# Patient Record
Sex: Female | Born: 1935
Health system: Southern US, Community
[De-identification: ages and names within clinical notes are randomized; demographics above are authoritative.]

## PROBLEM LIST (undated history)

## (undated) DIAGNOSIS — R112 Nausea with vomiting, unspecified: Secondary | ICD-10-CM

## (undated) DIAGNOSIS — I219 Acute myocardial infarction, unspecified: Secondary | ICD-10-CM

## (undated) DIAGNOSIS — M545 Low back pain, unspecified: Secondary | ICD-10-CM

## (undated) DIAGNOSIS — I2510 Atherosclerotic heart disease of native coronary artery without angina pectoris: Secondary | ICD-10-CM

## (undated) DIAGNOSIS — Z9289 Personal history of other medical treatment: Secondary | ICD-10-CM

## (undated) DIAGNOSIS — D496 Neoplasm of unspecified behavior of brain: Secondary | ICD-10-CM

## (undated) DIAGNOSIS — C4490 Unspecified malignant neoplasm of skin, unspecified: Secondary | ICD-10-CM

## (undated) DIAGNOSIS — E7800 Pure hypercholesterolemia, unspecified: Secondary | ICD-10-CM

## (undated) DIAGNOSIS — Z923 Personal history of irradiation: Secondary | ICD-10-CM

## (undated) DIAGNOSIS — G8929 Other chronic pain: Secondary | ICD-10-CM

## (undated) DIAGNOSIS — K219 Gastro-esophageal reflux disease without esophagitis: Secondary | ICD-10-CM

## (undated) DIAGNOSIS — Z9889 Other specified postprocedural states: Secondary | ICD-10-CM

## (undated) DIAGNOSIS — I10 Essential (primary) hypertension: Secondary | ICD-10-CM

## (undated) DIAGNOSIS — M1990 Unspecified osteoarthritis, unspecified site: Secondary | ICD-10-CM

## (undated) HISTORY — PX: SKIN CANCER EXCISION: SHX779

## (undated) HISTORY — DX: Neoplasm of unspecified behavior of brain: D49.6

## (undated) HISTORY — PX: BACK SURGERY: SHX140

## (undated) HISTORY — PX: OTHER SURGICAL HISTORY: SHX169

## (undated) HISTORY — PX: CORONARY ANGIOPLASTY: SHX604

## (undated) HISTORY — PX: CARPAL TUNNEL RELEASE: SHX101

## (undated) HISTORY — PX: SHOULDER OPEN ROTATOR CUFF REPAIR: SHX2407

## (undated) HISTORY — PX: THYROID SURGERY: SHX805

## (undated) HISTORY — PX: ANTERIOR CERVICAL DECOMP/DISCECTOMY FUSION: SHX1161

---

## 2000-09-20 ENCOUNTER — Ambulatory Visit (HOSPITAL_BASED_OUTPATIENT_CLINIC_OR_DEPARTMENT_OTHER): Admission: RE | Admit: 2000-09-20 | Discharge: 2000-09-20 | Payer: Self-pay | Admitting: Orthopaedic Surgery

## 2001-05-09 ENCOUNTER — Ambulatory Visit (HOSPITAL_BASED_OUTPATIENT_CLINIC_OR_DEPARTMENT_OTHER): Admission: RE | Admit: 2001-05-09 | Discharge: 2001-05-10 | Payer: Self-pay | Admitting: Orthopaedic Surgery

## 2004-08-09 DIAGNOSIS — I219 Acute myocardial infarction, unspecified: Secondary | ICD-10-CM

## 2004-08-09 HISTORY — DX: Acute myocardial infarction, unspecified: I21.9

## 2004-09-19 ENCOUNTER — Inpatient Hospital Stay (HOSPITAL_COMMUNITY): Admission: EM | Admit: 2004-09-19 | Discharge: 2004-09-24 | Payer: Self-pay | Admitting: Emergency Medicine

## 2004-12-30 ENCOUNTER — Encounter: Admission: RE | Admit: 2004-12-30 | Discharge: 2004-12-30 | Payer: Self-pay | Admitting: Cardiovascular Disease

## 2005-11-25 ENCOUNTER — Encounter: Admission: RE | Admit: 2005-11-25 | Discharge: 2005-11-25 | Payer: Self-pay | Admitting: Cardiovascular Disease

## 2005-12-23 ENCOUNTER — Encounter: Admission: RE | Admit: 2005-12-23 | Discharge: 2005-12-23 | Payer: Self-pay | Admitting: Cardiovascular Disease

## 2006-01-19 ENCOUNTER — Ambulatory Visit: Payer: Self-pay | Admitting: Infectious Diseases

## 2007-05-22 ENCOUNTER — Encounter: Admission: RE | Admit: 2007-05-22 | Discharge: 2007-05-22 | Payer: Self-pay | Admitting: Cardiovascular Disease

## 2008-09-13 ENCOUNTER — Inpatient Hospital Stay (HOSPITAL_COMMUNITY): Admission: RE | Admit: 2008-09-13 | Discharge: 2008-09-15 | Payer: Self-pay | Admitting: Neurosurgery

## 2009-09-15 ENCOUNTER — Encounter: Admission: RE | Admit: 2009-09-15 | Discharge: 2009-09-15 | Payer: Self-pay | Admitting: Cardiovascular Disease

## 2010-01-28 ENCOUNTER — Encounter: Admission: RE | Admit: 2010-01-28 | Discharge: 2010-01-28 | Payer: Self-pay | Admitting: Cardiovascular Disease

## 2010-02-04 ENCOUNTER — Inpatient Hospital Stay (HOSPITAL_COMMUNITY): Admission: RE | Admit: 2010-02-04 | Discharge: 2010-02-06 | Payer: Self-pay | Admitting: Neurosurgery

## 2010-04-30 ENCOUNTER — Encounter: Admission: RE | Admit: 2010-04-30 | Discharge: 2010-04-30 | Payer: Self-pay | Admitting: Neurosurgery

## 2010-10-25 LAB — SURGICAL PCR SCREEN
MRSA, PCR: NEGATIVE
Staphylococcus aureus: NEGATIVE

## 2010-10-25 LAB — CBC
MCHC: 34.4 g/dL (ref 30.0–36.0)
RDW: 13.4 % (ref 11.5–15.5)
WBC: 3.4 10*3/uL — ABNORMAL LOW (ref 4.0–10.5)

## 2010-10-25 LAB — BASIC METABOLIC PANEL
BUN: 24 mg/dL — ABNORMAL HIGH (ref 6–23)
Calcium: 9.7 mg/dL (ref 8.4–10.5)
GFR calc non Af Amer: 43 mL/min — ABNORMAL LOW (ref >60)
Glucose, Bld: 115 mg/dL — ABNORMAL HIGH (ref 70–99)
Sodium: 137 mEq/L (ref 135–145)

## 2010-11-24 LAB — BASIC METABOLIC PANEL
BUN: 9 mg/dL (ref 6–23)
Calcium: 9.4 mg/dL (ref 8.4–10.5)
Creatinine, Ser: 0.78 mg/dL (ref 0.4–1.2)
GFR calc Af Amer: >60 mL/min (ref >60)
GFR calc non Af Amer: >60 mL/min (ref >60)

## 2010-11-24 LAB — CBC
Platelets: 293 10*3/uL (ref 150–400)
RBC: 3.71 MIL/uL — ABNORMAL LOW (ref 3.87–5.11)
WBC: 3.9 10*3/uL — ABNORMAL LOW (ref 4.0–10.5)

## 2010-12-22 NOTE — Op Note (Signed)
NAMEYERALDINE, FORNEY             ACCOUNT NO.:  [id number]   MEDICAL RECORD NO.:  [id number]          PATIENT TYPE:  INP   LOCATION:  2899                         FACILITY:  [facility]   PHYSICIAN:  Hilda Lias, M.D.   DATE OF BIRTH:  1934-10-19   DATE OF PROCEDURE:  09/13/2008  DATE OF DISCHARGE:                               OPERATIVE REPORT   PREOPERATIVE DIAGNOSIS:  C3-C4, C4-C5, and C5-C6 spondylosis with  chronic radiculopathy.   POSTOPERATIVE DIAGNOSES:  C3-C4, C4-C5, and C5-C6 spondylosis with  chronic radiculopathy.   PROCEDURE:  Anterior C3-C4, C4-C5, and C5-C6 diskectomy, decompression  of spinal cord, interbody fusion with auto and allograft, plate, and  microscope.   SURGEON:  Hilda Lias, MD   ASSISTANT:  Danae Orleans. Venetia Maxon, MD.   CLINICAL HISTORY:  Ms. Peake is a 76 year old female complaining of  neck pain with rest of both upper extremity, left worse than right one.  The patient has failed conservative treatment.  X-ray showed stenosis  with foraminal narrowing at the level of C3-C4, C4-C5, and C5-C6.  Surgery was advised.   PROCEDURE IN DETAIL:  The patient was taken to the OR.  After  intubation, the left side of the neck was cleaned with DuraPrep.  A  transverse incision was made through the skin, subcutaneous tissue,  platysma down to the cervical area.  X-rays showed that we were at the  level of C4-C5.  From then on, we opened the anterior ligament of C3-C4,  C4-C5, and C5-C6 and also we proceeded with removal of osteophyte.  We  entered the disk space at the level of C3-C4.  The disk space was quite  narrow and there was some calcification of the posterior ligament.  Total diskectomy using the drill as well as the Kerrison punch was  achieved.  The posterior ligament was opened and the calcified posterior  ligament was removed with decompression of the spinal cord and the  foramen bilaterally.  The same procedure was done at the level of C4-C5  and  C5-C6 with the same finding, being the worse at C4-C5.  Nevertheless, there was quite a stenosis at the level of C5-C6.  With  moderate difficulty, the area was well decompressed, the spinal cord was  opened and freed.  Foraminotomy was accomplished and the endplate was  drilled.  At the level of C3-C4, we introduced a lordotic 6-mm graft  with autograft inside and BMX.  At the level of C4-C5 and C5-C6, the  graft was 7 mm and also lordotic.  Then a plate using 4 screws which  were fixed and four which were variable of 30 mm length were inserted to  get the plate in place.  Lateral cervical spine showed good position of  the graft and the plate.  We investigated the area.  The patient had  quite a bit of hemostasis.  There was no evidence of any bleeding.  Because of that, as for the time being we decided not to leave any  drain.  The wound was closed with Vicryl and Steri-Strips.  ______________________________  Hilda Lias, M.D.     EB/MEDQ  D:  09/13/2008  T:  09/14/2008  Job:  [id number]

## 2010-12-22 NOTE — Discharge Summary (Signed)
[name], WORTH             ACCOUNT NO.:  [id number]   MEDICAL RECORD NO.:  [id number]          PATIENT TYPE:  INP   LOCATION:  3019                         FACILITY:  [facility]   PHYSICIAN:  Danae Orleans. Venetia Maxon, M.D.  DATE OF BIRTH:  1935-06-23   DATE OF ADMISSION:  09/13/2008  DATE OF DISCHARGE:  09/15/2008                               DISCHARGE SUMMARY   REASON FOR ADMISSION:  Herniated cervical disk with cervical spondylosis  and radiculopathy at C3-4, C4-5, C5-6 levels post.   FINAL DIAGNOSES:  Herniated cervical disk with cervical spondylosis and  radiculopathy at C3-4, C4-5, C5-6 levels post.   BRIEF HISTORY, PHYSICAL, AND HOSPITAL COURSE:  Adriana Jordan is a 76-  year-old woman with neck pain and both bilateral upper extremity pain,  left greater than right who was elected to take her for surgery where  she after failed conservative management and she underwent anterior  cervical decompression and fusion at C3-4, C4-5, and C5-6 levels with  allograft bone graft and anterior cervical plate.  She did well  postoperatively.  Had some soreness initially.  Postoperatively, she has  been turned on a PCA for pain control.  This was discontinued on  postoperative day #1 and postoperative day #2, she was up and walking  without significant pain complaints, taking oral pain medication without  difficulty, mobilizing without difficulty, and with full strength in  upper extremities.  Overall, she was improving nicely.  It was elected  to discharge her home.   Discharge medications include her preoperative medications of  Women's  daily multivitamin, Benicar 20 mg daily, simvastatin 40 mg daily,  omeprazole 20 mg daily, promethazine 12.5-25 mg as needed, and Percocet  5/325 one to two every 4 hours as needed for pain.   Instructions were to wear her soft cervical collar and to follow up with  Dr. Jeral Fruit in the office in 3 weeks with a postoperative radiographs.      Danae Orleans.  Venetia Maxon, M.D.  Electronically Signed     JDS/MEDQ  D:  09/15/2008  T:  09/16/2008  Job:  [id number]

## 2010-12-25 NOTE — Discharge Summary (Signed)
Adriana Jordan, Adriana Jordan             ACCOUNT NO.:  [id number]   MEDICAL RECORD NO.:  [id number]          PATIENT TYPE:  INP   LOCATION:  2022                         FACILITY:  [facility]   PHYSICIAN:  Ricki Rodriguez, M.D.  DATE OF BIRTH:  June 18, 1935   DATE OF ADMISSION:  09/19/2004  DATE OF DISCHARGE:  09/24/2004                                 DISCHARGE SUMMARY   PRINCIPAL DIAGNOSES:  1.  Acute non-Q-wave myocardial infarction.  2.  Two vessel coronary artery disease.  3.  Reflux esophagitis.  4.  Status post percutaneous transluminal coronary angioplasty of right      coronary artery and left anterior descending.   DISCHARGE MEDICATIONS:  1.  Plavix 75 mg one daily.  2.  Aspirin 325 mg one daily.  3.  Zocor 40 mg one daily.  4.  Colace 100 mg two daily.  5.  Toprol XL 25 mg one daily.  6.  Prilosec over-the-counter one twice daily.  7.  Norvasc 2.5 mg one daily.  8.  Altace 2.5 mg one daily.   ACTIVITY:  As tolerated.  Increase walking as tolerated.   DIET:  Low fat, low salt diet as tolerated.   FOLLOWUP:  With Dr. Orpah Cobb in two weeks.  The patient is to call [phone] for appointment.   CONDITION ON DISCHARGE:  Improved.   HISTORY:  This 76 year old white female presented with chest pain,  substernal, pressure-like, radiating to the back, associated with sweating  spell, minimal relief with the nitroglycerin sublingual, however,  significant relief with oxygen and IV nitroglycerin drip.   PAST MEDICAL HISTORY:  Negative for diabetes, hypertension, smoking,  myocardial infarction, and alcohol intake.  Positive for elevated  cholesterol level.   PHYSICAL EXAMINATION:  VITAL SIGNS:  Pulse 66, respirations 16, blood  pressure 121/60, height 5 feet 7 inches, weight 157 pounds.  GENERAL:  The patient was alert and oriented x3.  HEENT:  Head was normocephalic, atraumatic.  Smt. eyes.  Conjunctivae pink.  Sclerae nonicteric.  The patient is wearing glasses.  Ears,  nose, throat:  Pink and moist.  NECK:  No JVD, no carotid bruits.  LUNGS:  Clear bilaterally.  HEART:  Normal S1 and S2.  ABDOMEN:  Soft and nontender.  EXTREMITIES:  No cyanosis, clubbing, or edema.  Peripheral pulses were 2+.  NEUROLOGIC:  The patient moves all four extremities and had bilateral equal  grips.   LABORATORY DATA:  Sodium 136, potassium 5.4, glucose 122, BUN and creatinine  normal.  Subsequent sodium was 140, potassium was 4.2, glucose was down to  97, and BUN and creatinine were normal at 11 and 0.9, respectively.  CPK  elevated at 257, with MB of 14.2, and troponin-I of 0.78.  Subsequent CPK  was 543, with a MB of 61, and troponin-I of 4.75.  The final CPK was 322,  with a MB of 6.9, and troponin-I of 2.81.  Cholesterol was elevated at 297,  HDL cholesterol of 60, and triglycerides of 54.  The patient's hemoglobin  was 13.3, hematocrit of 38.1, normal white blood cell count and platelet  count.  Subsequent hemoglobin was 11.2, hematocrit of 32.7, and normal white  blood cell count and platelet count.  Chest x-ray revealed borderline  cardiomegaly with mild pulmonary vascular congestion, mild bronchitic  changes.  EKG revealed normal sinus rhythm with left anterior fascicular  block.   HOSPITAL COURSE:  The patient was admitted to telemetry unit.  She ruled in  for a non-Q-wave myocardial infarction with a small posterior wall  infarction.  She received IV nitroglycerin, heparin, IV Integrilin.  She  underwent cardiac catheterization on September 21, 2004, that showed  significant two vessel coronary artery disease.  She had a stent placement 3  mm x 23 mm in the mid LAD lesion that was reduced to 0%, and she had a 3 mm  x 18 mm Cypher stent placed in the right coronary artery mid vessel area  that was reduced to 0% residual lesion also with good TIMI III flow.  Overall, the patient had successful angioplasty.  Post-procedure had no  complications, and gradual  increase of activity which was tolerated well,  and hence she was discharged home on September 24, 2004, in satisfactory  condition with a followup by me in two to three weeks.      ASK/MEDQ  D:  09/24/2004  T:  09/24/2004  Job:  [id number]

## 2010-12-25 NOTE — Op Note (Signed)
[place]  Patient:    Adriana Jordan, Adriana Jordan Visit Number: [id number] MRN: [id number]          Service Type: DSU Location: [facility] Attending Physician:  Marcene Corning Proc. Date: 05/09/01 Admit Date:  05/09/2001                             Operative Report  PREOPERATIVE DIAGNOSIS:  Right shoulder rotator cuff tear recurrent.  POSTOPERATIVE DIAGNOSIS:  Right shoulder rotator cuff tear recurrent.  PROCEDURE PERFORMED: 1. Right shoulder arthroscopic debridement. 2. Right shoulder open repeat rotator cuff repair.  ANESTHESIA:  General en bloc.  SURGEON:  Lubertha Basque. Jerl Santos, M.D.  ASSISTANT:  Prince Rome, P.A.  INDICATIONS:  The patient is a 76 year old woman with a long history of right shoulder difficulty.  She is status post two surgeries to the shoulder, one of which I had performed.  She had a mini open rotator cuff repair many months ago, but never really got better.  I then performed an arthroscopy several months ago, and again she never really got better.  A follow up MRI scan was performed a couple of months ago, which showed a recurrent rotator cuff tear on the far posterior aspect of the cuff measuring 6 or 8 mm in width.  At this point, this continues to interfere with rest, work, and recreation.  She is offered repeat surgery.  The procedure risks were discussed with the patient and informed operative consent was obtained after discussion of the possible complications of reaction to anesthesia and infection.  DESCRIPTION OF PROCEDURE:  The patient was taken to the operating suite where a general anesthetic was applied without difficulty.  She was also given a preoperative block in the preanesthesia area.  She was positioned in the beach chair position, prepped, and draped in a normal sterile fashion.  After the administration of preop IV antibiotics, an arthroscopy to the right shoulder was performed through a total of two  portals.  The glenohumeral joint showed no degenerative change in the biceps tendon and labrale structures were well attached.  The rotator cuff was in fact torn over a 1 or 2 cm area on the far posterior aspect.  This was in the area of her earlier repair and it appeared that the sutures had pulled through the rotator cuff.  Her lateral portal was extended slightly to create about a 1 inch incision.  Dissection was carried down to expose the rotator cuff.  This cuff was a near full thickness tear. An incision was made and sutures were found below.  It felt as though she had excellent quality rotator cuff tissue and it was felt that a repair was definitely in her best interest.  A bur was used to remove some of the articular cartilage in the area of our planned repair.  The old sutures were removed and two new suture anchors were placed which were the 5.0 mm absorbable arthrex anchors.  These each had two Ethibond sutures emanating. These sutures were placed through the rotator cuff in horizontal mattress fashion and the cuff was tied down using all four of these sutures to the bleeding bed of bone.  An excellent repair was achieved with her arm at her side.  The shoulder was ranged fully and thoroughly irrigated.  No additional tears were seen.  The deltoid fascia was reapproximated with 0 Vicryl followed by subcutaneous reapproximation with 2-0 undyed Vicryl and skin  with nylon. Adaptic was placed on the wound and Marcaine was injected into the shoulder joint.  A dry gauze was applied followed by tape.  Estimated blood loss and intraoperative fluids, see anesthesia record.  The patient was extubated in the operating room and taken to the recovery room in stable condition.  Plans were for her to stay overnight for pain control with probable discharge home in the morning. Attending Physician:  Marcene Corning DD:  05/09/01 TD:  05/09/01 Job: [id number]

## 2010-12-25 NOTE — Cardiovascular Report (Signed)
[name], [name]             ACCOUNT NO.:  [id number]   MEDICAL RECORD NO.:  [id number]          PATIENT TYPE:  INP   LOCATION:  3315                         FACILITY:  [facility]   PHYSICIAN:  Ricki Rodriguez, M.D.  DATE OF BIRTH:  09-Jun-1935   DATE OF PROCEDURE:  09/21/2004  DATE OF DISCHARGE:                              CARDIAC CATHETERIZATION   PROCEDURES:  1.  Left heart catheterization.  2.  Selective coronary angiography.  3.  Left ventriculography.   INDICATIONS FOR PROCEDURE:  This 76 year old, white female had recent non-Q  wave myocardial infarction with chest pain, abnormal cardiac enzymes and  elevated cholesterol level.   APPROACH:  Right femoral artery using 5 French sheath and catheter.   COMPLICATIONS:  None.   HEMODYNAMIC DATA:  The left ventricular pressure was 114/10 and aortic  pressure was 114/63.   Less than 60 cc of dye was used.   Left ventriculogram showed mild inferior wall hypokinesia with ejection  fraction of 50-55%.   The left main coronary artery was unremarkable.   The left anterior descending coronary artery showed proximal luminal  irregularities followed by 30% eccentric and mid vessel, long 99% stenosis.  The distal vessel was unremarkable.   Diagonal-1 was also unremarkable and was larger vessel compared to diagonal-  2, diagonal-3 and diagonal-4, which are very small vessels.   Left circumflex coronary artery had osteal 20% narrowing and mid vessel  luminal irregularities, otherwise unremarkable.   Obtuse maginal-1 was small vessel.  Obtuse marginal-2 and obtuse marginal-3  were larger vessels and unremarkable.   The right coronary artery had proximal calcification and luminal  irregularities with 20% narrowing gradually tapering into 80% mid vessel  disease.  The distal vessel was without any significant lesions.  Posterior  branch were unremarkable and posterior descending artery had luminal  irregularities.    IMPRESSION:  1.  Two-vessel coronary artery disease.  2.  Mild left ventricular systolic dysfunction.   RECOMMENDATIONS:  This patient will undergo PTCA with stent placement in  left anterior descending coronary artery and possible stent in right  coronary artery.  Dr. Eduardo Osier. Harwani was notified.      ASK/MEDQ  D:  09/21/2004  T:  09/21/2004  Job:  [id number]

## 2010-12-25 NOTE — Op Note (Signed)
[place]  Patient:    Adriana Jordan, Adriana Jordan                      MRN: [id number] Proc. Date: 09/20/00 Adm. Date:  [id number] Attending:  Marcene Corning                           Operative Report  PREOPERATIVE DIAGNOSIS: 1. Right shoulder partial rotator cuff tear. 2. Right shoulder acromioclavicular pain.  POSTOPERATIVE DIAGNOSIS: 1. Right shoulder partial rotator cuff tear. 2. Right shoulder acromioclavicular pain.  OPERATION PERFORMED: 1. Right shoulder arthroscopic debridement. 2. Right shoulder arthroscopic acromioplasty. 3. Right shoulder arthroscopic AP resection.  ANESTHESIA:  General.  ATTENDING SURGEON:  Lubertha Basque. Jerl Santos, M.D.  ASSISTANT:  Lindwood Qua, P.A.  INDICATIONS FOR PROCEDURE:  The patient is a 76 year old woman about a year and a half from a shoulder arthropathy with open rotator cuff repair.  She has persisted with pain.  She has undergone extensive postoperative physical therapy.  She has had an MRI scan which shows a questionable rotator cuff tear with impingement from her acromioclavicular joint.  At this point she has also had oral anti-inflammatories and postoperative injections.  She is offered a repeat arthroscopy.  The procedure was discussed with the patient and informed operative consent was obtained after discussion of possible complications of reaction to anesthesia and infection.  DESCRIPTION OF PROCEDURE:  The patient was taken to an operating suite where general anesthetic was applied without difficulty.  She was then positioned in beach chair position and prepped and draped in normal sterile fashion.  After the administration of preop intravenous antibiotics, an arthroscopy of the right shoulder was performed through a total of three portals.  The glenohumeral joint showed no degenerative change and all labral structures were well attached including the biceps anchor.  There were some  mild degenerative changes on the humeral head which I would call grade 2.  She did have a partial thickness tear of the rotator cuff, seen from below and this required a debridement with the shaver.  From above, the cuff did appear intact to thorough inspection even after bursectomy had been completed.  I could not find the sutures placed at her initial surgery.  She had some mild residual prominence of the undersurface of her acromion which was addressed with a brief repeat acromioplasty.  The main problem seemed to be at the AP joint where she had hypertrophy of the distal clavicle which appeared to impinge on the cuff.  The undersurface of this bone was removed followed by a formal decompression of the [facility] joint where she also had bone-on-bone contact. The shoulder was thoroughly irrigated at the end of the case followed by placement of Marcaine with epinephrine and morphine.  Simple sutures of nylon were used to reapproximate the portals loosely followed by Adaptic and dry gauze with tape.  Estimated blood loss and intraoperative fluids can be obtained from Anesthesia records.  DISPOSITION:  The patient was taken to the recovery room in stable condition. Plans were for her to go home the same day and to follow up in the office in less than a week.  I will contact her by phone tonight. DD:  09/20/00 TD:  09/20/00 Job: [id number]

## 2010-12-25 NOTE — Cardiovascular Report (Signed)
Adriana Jordan, Adriana Jordan             ACCOUNT NO.:  [id number]   MEDICAL RECORD NO.:  [id number]          PATIENT TYPE:  INP   LOCATION:  3315                         FACILITY:  [facility]   PHYSICIAN:  Eduardo Osier. Sharyn Lull, M.D. DATE OF BIRTH:  26-Sep-1934   DATE OF PROCEDURE:  09/21/2004  DATE OF DISCHARGE:                              CARDIAC CATHETERIZATION   PROCEDURE:  1.  Successful percutaneous transluminal coronary angioplasty to mid left      anterior descending using 3.0 x 12.0-mm-long Maverick balloon.  2.  Successful deployment of 3.0 x 23.0-mm-long CYPHER drug-eluting stent in      mid left anterior descending.  3.  Successful percutaneous transluminal coronary angioplasty to mid right      coronary artery using 3.0 x 12.0-mm-long Maverick balloon.  4.  Successful deployment of 3.0 x 18.0-mm-long CYPHER drug-eluting stent in      mid right coronary artery.   INDICATIONS FOR PROCEDURE:  Adriana Jordan is a 76 year old white female with  past medical history significant for hypercholesteremia and GERD, who was  admitted by Dr. Algie Coffer on September 19, 2004 because of retrosternal chest  pain, pressure-like, radiating to the back, associated with diaphoresis  which resolved with sublingual nitroglycerin. Initial EKG showed normal  sinus rhythm with nonspecific ST-T wave changes. Repeat EKG showed normal  sinus rhythm with tall R-wave in V2 and T-wave inversion in anterolateral  leads. The patient ruled in for non-Q-wave myocardial infarction due to  elevated cardiac enzymes and EKG changes. The patient subsequently  underwent. Cardiac catheterization by Dr. Algie Coffer; LV showed mild inferior  wall hypokinesia, EF of 50% to 55%. Left main was okay. LAD has mild  proximal stenosis and then 90% to 95% mid-stenosis. Diagonal 1 was patent.  Diagonal 2, 3, and 4 were small vessels.  The left circumflex has mild  disease. OM-1 was small; OM-2 and OM-3 were patent. RCA has 80% to 85% mid-  stenosis and 20% mid and distal junction stenosis beyond the 80% stenosis.  PDA was patent. I was called for PTCA stenting to LAD and RCA.   PROCEDURE:  After obtaining the informed consent, the right groin was  prepped and draped in usual fashion. A 5-French arterial sheath was  exchanged to a 7-French arterial sheath over the wire without difficulty.  Next a 7-French Voda guiding catheter was advanced over the wire under  fluoroscopic guidance up to the ascending aorta. Wire was pulled out. The  catheter was aspirated and connected to the manifold. Catheter was further  advanced and engaged into left coronary ostium.   INTERVENTIONAL PROCEDURE:  Successful PTCA to mid LAD was done using 3.0 x  12.0-mm-long Maverick balloon for predilatation and then 3.0 x 23.0-mm-long  CYPHER drug-eluting stent was deployed at 10 atmospheric pressure, which was  fully expanded going up to 15 atmospheric pressure.  Lesion was dilated from  90% to 95% to 0% residual with excellent TIMI grade 3 distal flow without  evidence of dissection or distal embolization. Next, this guiding catheter  was pulled out over the wire, sheaths aspirated and flushed and then 7-  [place] JR-4 guiding catheter with side holes was advanced over the wire  under fluoroscopic guidance up to the ascending aorta. Wire was pulled out.  The catheter was aspirated and connected to the manifold. Catheter was  further advanced and engaged into right coronary ostium. Multiple views of  the right system were taken.   Next interventional procedure: Successful PTCA to mid RCA was done using  same 3.0 x 12.0-mm-long Maverick balloon for predilatation and then 3.0 x  18.0-mm-long CYPHER drug-eluting stent was deployed at 10 atmospheric  pressure, which was fully expanded, going up to 15 atmospheric pressure.  Lesion was dilated from 80% to 85% to 0% residual with excellent TIMI grade  3 distal flow without evidence of dissection or distal  embolization. The  patient received weight-based heparin, Integrilin and 300 mg of additional  Plavix during the procedure. The patient tolerated procedure well. There are  no complications. The patient was transferred to recovery room in stable  condition.      MNH/MEDQ  D:  09/21/2004  T:  09/22/2004  Job:  [id number]   cc:   [facility]   Ricki Rodriguez, M.D.  [address]  [place] [address]  Fax: [phone]

## 2011-07-28 ENCOUNTER — Other Ambulatory Visit: Payer: Self-pay | Admitting: Neurosurgery

## 2011-07-28 DIAGNOSIS — M549 Dorsalgia, unspecified: Secondary | ICD-10-CM

## 2011-07-28 DIAGNOSIS — M5410 Radiculopathy, site unspecified: Secondary | ICD-10-CM

## 2011-08-04 ENCOUNTER — Ambulatory Visit
Admission: RE | Admit: 2011-08-04 | Discharge: 2011-08-04 | Disposition: A | Discharge status: Home / Self Care | Payer: Medicare Other | Source: Ambulatory Visit | Attending: Neurosurgery | Admitting: Neurosurgery

## 2011-08-04 VITALS — BP 123/66 | HR 74 | Temp 97.4°F | Resp 14

## 2011-08-04 DIAGNOSIS — M5410 Radiculopathy, site unspecified: Secondary | ICD-10-CM

## 2011-08-04 DIAGNOSIS — M549 Dorsalgia, unspecified: Secondary | ICD-10-CM

## 2011-08-04 MED ORDER — IOHEXOL 180 MG/ML  SOLN
18.0000 mL | Freq: Once | INTRAMUSCULAR | Status: AC | PRN
  Administered 2011-08-04: 18 mL via INTRATHECAL

## 2011-08-04 MED ORDER — DIAZEPAM 5 MG/ML PO CONC
5.0000 mg | Freq: Once | ORAL | Status: DC
Start: 2011-08-04 — End: 2011-08-04

## 2011-08-04 MED ORDER — DIAZEPAM 5 MG PO TABS: 5.0000 mg | ORAL_TABLET | Freq: Once | ORAL | Status: DC

## 2011-08-04 MED ORDER — HYDROCODONE-ACETAMINOPHEN 5-325 MG PO TABS: 2.0000 | ORAL_TABLET | Freq: Once | ORAL | Status: DC

## 2011-08-04 NOTE — Progress Notes (Signed)
4403  Informed consent obtained.  1030  Taking po liquids.  Tolerated well.  Does not want pain med at present.    1045  Patient c/o bilateral lower extremity discomfort.  Received med (per Dr Karin Golden).  1115  More comfortable at present.  1156 To BR w/ assistance.  Gait steady.  Tolerated well.   Patient received verbal & written discharge instructions.  States that she understands.  Also, reviewed discharge instructions w/ patient's friend, Judeth Cornfield.  1200 Pt discharged to home.  Judeth Cornfield to drive.

## 2012-02-01 ENCOUNTER — Other Ambulatory Visit: Payer: Self-pay | Admitting: Cardiovascular Disease

## 2012-02-01 ENCOUNTER — Ambulatory Visit
Admission: RE | Admit: 2012-02-01 | Discharge: 2012-02-01 | Disposition: A | Discharge status: Home / Self Care | Payer: Medicare Other | Source: Ambulatory Visit | Attending: Cardiovascular Disease | Admitting: Cardiovascular Disease

## 2012-02-01 DIAGNOSIS — R0602 Shortness of breath: Secondary | ICD-10-CM

## 2012-02-17 ENCOUNTER — Ambulatory Visit (HOSPITAL_COMMUNITY)
Admission: RE | Admit: 2012-02-17 | Discharge: 2012-02-17 | Disposition: A | Discharge status: Home / Self Care | Payer: Medicare Other | Source: Ambulatory Visit | Attending: Cardiovascular Disease | Admitting: Cardiovascular Disease

## 2012-02-17 DIAGNOSIS — R0602 Shortness of breath: Secondary | ICD-10-CM | POA: Insufficient documentation

## 2012-02-17 MED ORDER — ALBUTEROL SULFATE (5 MG/ML) 0.5% IN NEBU
2.5000 mg | INHALATION_SOLUTION | Freq: Once | RESPIRATORY_TRACT | Status: AC
  Administered 2012-02-17: 2.5 mg via RESPIRATORY_TRACT

## 2014-12-03 DIAGNOSIS — I2510 Atherosclerotic heart disease of native coronary artery without angina pectoris: Secondary | ICD-10-CM | POA: Diagnosis not present

## 2014-12-04 DIAGNOSIS — M84374D Stress fracture, right foot, subsequent encounter for fracture with routine healing: Secondary | ICD-10-CM | POA: Diagnosis not present

## 2015-01-29 DIAGNOSIS — M67911 Unspecified disorder of synovium and tendon, right shoulder: Secondary | ICD-10-CM | POA: Diagnosis not present

## 2015-01-29 DIAGNOSIS — M1712 Unilateral primary osteoarthritis, left knee: Secondary | ICD-10-CM | POA: Diagnosis not present

## 2015-01-29 DIAGNOSIS — M79671 Pain in right foot: Secondary | ICD-10-CM | POA: Diagnosis not present

## 2015-02-05 DIAGNOSIS — M25511 Pain in right shoulder: Secondary | ICD-10-CM | POA: Diagnosis not present

## 2015-02-18 DIAGNOSIS — F419 Anxiety disorder, unspecified: Secondary | ICD-10-CM | POA: Diagnosis not present

## 2015-02-18 DIAGNOSIS — E784 Other hyperlipidemia: Secondary | ICD-10-CM | POA: Diagnosis not present

## 2015-02-18 DIAGNOSIS — I2510 Atherosclerotic heart disease of native coronary artery without angina pectoris: Secondary | ICD-10-CM | POA: Diagnosis not present

## 2015-02-18 DIAGNOSIS — I10 Essential (primary) hypertension: Secondary | ICD-10-CM | POA: Diagnosis not present

## 2015-02-26 DIAGNOSIS — M79671 Pain in right foot: Secondary | ICD-10-CM | POA: Diagnosis not present

## 2015-03-01 ENCOUNTER — Emergency Department (HOSPITAL_COMMUNITY): Payer: Medicare PPO

## 2015-03-01 ENCOUNTER — Encounter (HOSPITAL_COMMUNITY): Payer: Self-pay | Admitting: Family Medicine

## 2015-03-01 ENCOUNTER — Emergency Department (HOSPITAL_COMMUNITY)
Admission: EM | Admit: 2015-03-01 | Discharge: 2015-03-01 | Disposition: A | Discharge status: Home / Self Care | Payer: Medicare PPO | Attending: Emergency Medicine | Admitting: Emergency Medicine

## 2015-03-01 DIAGNOSIS — R55 Syncope and collapse: Secondary | ICD-10-CM | POA: Diagnosis not present

## 2015-03-01 DIAGNOSIS — R531 Weakness: Secondary | ICD-10-CM | POA: Diagnosis not present

## 2015-03-01 DIAGNOSIS — R51 Headache: Secondary | ICD-10-CM | POA: Diagnosis not present

## 2015-03-01 DIAGNOSIS — I252 Old myocardial infarction: Secondary | ICD-10-CM | POA: Diagnosis not present

## 2015-03-01 DIAGNOSIS — Z87891 Personal history of nicotine dependence: Secondary | ICD-10-CM | POA: Insufficient documentation

## 2015-03-01 DIAGNOSIS — R42 Dizziness and giddiness: Secondary | ICD-10-CM | POA: Insufficient documentation

## 2015-03-01 DIAGNOSIS — R112 Nausea with vomiting, unspecified: Secondary | ICD-10-CM | POA: Insufficient documentation

## 2015-03-01 DIAGNOSIS — E236 Other disorders of pituitary gland: Secondary | ICD-10-CM | POA: Diagnosis not present

## 2015-03-01 DIAGNOSIS — R1111 Vomiting without nausea: Secondary | ICD-10-CM

## 2015-03-01 DIAGNOSIS — R404 Transient alteration of awareness: Secondary | ICD-10-CM | POA: Diagnosis not present

## 2015-03-01 DIAGNOSIS — R519 Headache, unspecified: Secondary | ICD-10-CM

## 2015-03-01 DIAGNOSIS — J3489 Other specified disorders of nose and nasal sinuses: Secondary | ICD-10-CM | POA: Diagnosis not present

## 2015-03-01 HISTORY — DX: Acute myocardial infarction, unspecified: I21.9

## 2015-03-01 LAB — CBC WITH DIFFERENTIAL/PLATELET
BASOS ABS: 0 10*3/uL (ref 0.0–0.1)
BASOS PCT: 1 % (ref 0–1)
Eosinophils Absolute: 0.2 10*3/uL (ref 0.0–0.7)
Eosinophils Relative: 8 % — ABNORMAL HIGH (ref 0–5)
HCT: 33.7 % — ABNORMAL LOW (ref 36.0–46.0)
Hemoglobin: 11.6 g/dL — ABNORMAL LOW (ref 12.0–15.0)
Lymphocytes Relative: 42 % (ref 12–46)
Lymphs Abs: 1.2 10*3/uL (ref 0.7–4.0)
MCH: 31.4 pg (ref 26.0–34.0)
MCHC: 34.4 g/dL (ref 30.0–36.0)
MCV: 91.3 fL (ref 78.0–100.0)
Monocytes Absolute: 0.4 10*3/uL (ref 0.1–1.0)
Monocytes Relative: 13 % — ABNORMAL HIGH (ref 3–12)
NEUTROS ABS: 1 10*3/uL — AB (ref 1.7–7.7)
NEUTROS PCT: 36 % — AB (ref 43–77)
PLATELETS: 236 10*3/uL (ref 150–400)
RBC: 3.69 MIL/uL — ABNORMAL LOW (ref 3.87–5.11)
RDW: 13.5 % (ref 11.5–15.5)
WBC: 2.8 10*3/uL — AB (ref 4.0–10.5)

## 2015-03-01 LAB — COMPREHENSIVE METABOLIC PANEL
ALBUMIN: 4.1 g/dL (ref 3.5–5.0)
ALT: 23 U/L (ref 14–54)
ANION GAP: 12 (ref 5–15)
AST: 46 U/L — AB (ref 15–41)
Alkaline Phosphatase: 64 U/L (ref 38–126)
BILIRUBIN TOTAL: 0.4 mg/dL (ref 0.3–1.2)
BUN: 19 mg/dL (ref 6–20)
CALCIUM: 9.3 mg/dL (ref 8.9–10.3)
CHLORIDE: 98 mmol/L — AB (ref 101–111)
CO2: 25 mmol/L (ref 22–32)
Creatinine, Ser: 1.26 mg/dL — ABNORMAL HIGH (ref 0.44–1.00)
GFR calc Af Amer: 46 mL/min — ABNORMAL LOW (ref >60)
GFR, EST NON AFRICAN AMERICAN: 40 mL/min — AB (ref >60)
Glucose, Bld: 112 mg/dL — ABNORMAL HIGH (ref 65–99)
Potassium: 3.2 mmol/L — ABNORMAL LOW (ref 3.5–5.1)
SODIUM: 135 mmol/L (ref 135–145)
Total Protein: 7 g/dL (ref 6.5–8.1)

## 2015-03-01 LAB — URINALYSIS, ROUTINE W REFLEX MICROSCOPIC
BILIRUBIN URINE: NEGATIVE
Glucose, UA: NEGATIVE mg/dL
Hgb urine dipstick: NEGATIVE
KETONES UR: NEGATIVE mg/dL
NITRITE: NEGATIVE
PH: 6 (ref 5.0–8.0)
PROTEIN: NEGATIVE mg/dL
Specific Gravity, Urine: 1.013 (ref 1.005–1.030)
UROBILINOGEN UA: 0.2 mg/dL (ref 0.0–1.0)

## 2015-03-01 LAB — URINE MICROSCOPIC-ADD ON

## 2015-03-01 LAB — LIPASE, BLOOD: LIPASE: 47 U/L (ref 22–51)

## 2015-03-01 MED ORDER — ONDANSETRON HCL 4 MG/2ML IJ SOLN
4.0000 mg | Freq: Once | INTRAMUSCULAR | Status: AC
  Administered 2015-03-01: 4 mg via INTRAVENOUS
  Filled 2015-03-01: qty 2

## 2015-03-01 MED ORDER — SODIUM CHLORIDE 0.9 % IV BOLUS (SEPSIS)
500.0000 mL | INTRAVENOUS | Status: AC
  Administered 2015-03-01: 500 mL via INTRAVENOUS

## 2015-03-01 MED ORDER — ONDANSETRON 4 MG PO TBDP: 4.0000 mg | ORAL_TABLET | Freq: Three times a day (TID) | ORAL | Status: DC | PRN

## 2015-03-01 MED ORDER — METOCLOPRAMIDE HCL 5 MG/ML IJ SOLN
10.0000 mg | Freq: Once | INTRAMUSCULAR | Status: AC
  Administered 2015-03-01: 10 mg via INTRAVENOUS
  Filled 2015-03-01: qty 2

## 2015-03-01 MED ORDER — POTASSIUM CHLORIDE CRYS ER 20 MEQ PO TBCR
40.0000 meq | EXTENDED_RELEASE_TABLET | Freq: Once | ORAL | Status: AC
  Administered 2015-03-01: 40 meq via ORAL
  Filled 2015-03-01: qty 2

## 2015-03-01 MED ORDER — DIPHENHYDRAMINE HCL 50 MG/ML IJ SOLN
25.0000 mg | Freq: Once | INTRAMUSCULAR | Status: AC
  Administered 2015-03-01: 25 mg via INTRAVENOUS
  Filled 2015-03-01: qty 1

## 2015-03-01 NOTE — ED Notes (Signed)
Pt brought from church gathering via EMS with c/o dizziness and nausea/vomiting that started while at church. Pt also reports headache x3 days.  Patient is alert and oriented x4.

## 2015-03-01 NOTE — ED Notes (Signed)
Son called and updated by Cori Razor.

## 2015-03-01 NOTE — ED Notes (Signed)
Son updated on plan of care.

## 2015-03-01 NOTE — ED Notes (Signed)
Pt ambulated around POD A successfully, and without assistance.

## 2015-03-01 NOTE — ED Notes (Signed)
Called ct for transport.

## 2015-03-01 NOTE — Discharge Instructions (Signed)
General Headache Without Cause A headache is pain or discomfort felt around the head or neck area. The specific cause of a headache may not be found. There are many causes and types of headaches. A few common ones are:  Tension headaches.  Migraine headaches.  Cluster headaches.  Chronic daily headaches. HOME CARE INSTRUCTIONS   Keep all follow-up appointments with your caregiver or any specialist referral.  Only take over-the-counter or prescription medicines for pain or discomfort as directed by your caregiver.  Lie down in a dark, quiet room when you have a headache.  Keep a headache journal to find out what may trigger your migraine headaches. For example, write down:  What you eat and drink.  How much sleep you get.  Any change to your diet or medicines.  Try massage or other relaxation techniques.  Put ice packs or heat on the head and neck. Use these 3 to 4 times per day for 15 to 20 minutes each time, or as needed.  Limit stress.  Sit up straight, and do not tense your muscles.  Quit smoking if you smoke.  Limit alcohol use.  Decrease the amount of caffeine you drink, or stop drinking caffeine.  Eat and sleep on a regular schedule.  Get 7 to 9 hours of sleep, or as recommended by your caregiver.  Keep lights dim if bright lights bother you and make your headaches worse. SEEK MEDICAL CARE IF:   You have problems with the medicines you were prescribed.  Your medicines are not working.  You have a change from the usual headache.  You have nausea or vomiting. SEEK IMMEDIATE MEDICAL CARE IF:   Your headache becomes severe.  You have a fever.  You have a stiff neck.  You have loss of vision.  You have muscular weakness or loss of muscle control.  You start losing your balance or have trouble walking.  You feel faint or pass out.  You have severe symptoms that are different from your first symptoms. MAKE SURE YOU:   Understand these  instructions.  Will watch your condition.  Will get help right away if you are not doing well or get worse. Document Released: 07/26/2005 Document Revised: 10/18/2011 Document Reviewed: 08/11/2011 [facility] Patient Information 2015 [place], [place]. This information is not intended to replace advice given to you by your health care provider. Make sure you discuss any questions you have with your health care provider.  Migraine Headache A migraine headache is an intense, throbbing pain on one or both sides of your head. A migraine can last for 30 minutes to several hours. CAUSES  The exact cause of a migraine headache is not always known. However, a migraine may be caused when nerves in the brain become irritated and release chemicals that cause inflammation. This causes pain. Certain things may also trigger migraines, such as:  Alcohol.  Smoking.  Stress.  Menstruation.  Aged cheeses.  Foods or drinks that contain nitrates, glutamate, aspartame, or tyramine.  Lack of sleep.  Chocolate.  Caffeine.  Hunger.  Physical exertion.  Fatigue.  Medicines used to treat chest pain (nitroglycerine), birth control pills, estrogen, and some blood pressure medicines. SIGNS AND SYMPTOMS  Pain on one or both sides of your head.  Pulsating or throbbing pain.  Severe pain that prevents daily activities.  Pain that is aggravated by any physical activity.  Nausea, vomiting, or both.  Dizziness.  Pain with exposure to bright lights, loud noises, or activity.  General sensitivity  to bright lights, loud noises, or smells. Before you get a migraine, you may get warning signs that a migraine is coming (aura). An aura may include:  Seeing flashing lights.  Seeing bright spots, halos, or zigzag lines.  Having tunnel vision or blurred vision.  Having feelings of numbness or tingling.  Having trouble talking.  Having muscle weakness. DIAGNOSIS  A migraine headache is often  diagnosed based on:  Symptoms.  Physical exam.  A CT scan or MRI of your head. These imaging tests cannot diagnose migraines, but they can help rule out other causes of headaches. TREATMENT Medicines may be given for pain and nausea. Medicines can also be given to help prevent recurrent migraines.  HOME CARE INSTRUCTIONS  Only take over-the-counter or prescription medicines for pain or discomfort as directed by your health care provider. The use of long-term narcotics is not recommended.  Lie down in a dark, quiet room when you have a migraine.  Keep a journal to find out what may trigger your migraine headaches. For example, write down:  What you eat and drink.  How much sleep you get.  Any change to your diet or medicines.  Limit alcohol consumption.  Quit smoking if you smoke.  Get 7-9 hours of sleep, or as recommended by your health care provider.  Limit stress.  Keep lights dim if bright lights bother you and make your migraines worse. SEEK IMMEDIATE MEDICAL CARE IF:   Your migraine becomes severe.  You have a fever.  You have a stiff neck.  You have vision loss.  You have muscular weakness or loss of muscle control.  You start losing your balance or have trouble walking.  You feel faint or pass out.  You have severe symptoms that are different from your first symptoms. MAKE SURE YOU:   Understand these instructions.  Will watch your condition.  Will get help right away if you are not doing well or get worse. Document Released: 07/26/2005 Document Revised: 12/10/2013 Document Reviewed: 04/02/2013 [facility] Patient Information 2015 [place], [place]. This information is not intended to replace advice given to you by your health care provider. Make sure you discuss any questions you have with your health care provider.  Nausea and Vomiting Nausea is a sick feeling that often comes before throwing up (vomiting). Vomiting is a reflex where stomach contents  come out of your mouth. Vomiting can cause severe loss of body fluids (dehydration). Children and elderly adults can become dehydrated quickly, especially if they also have diarrhea. Nausea and vomiting are symptoms of a condition or disease. It is important to find the cause of your symptoms. CAUSES   Direct irritation of the stomach lining. This irritation can result from increased acid production (gastroesophageal reflux disease), infection, food poisoning, taking certain medicines (such as nonsteroidal anti-inflammatory drugs), alcohol use, or tobacco use.  Signals from the brain.These signals could be caused by a headache, heat exposure, an inner ear disturbance, increased pressure in the brain from injury, infection, a tumor, or a concussion, pain, emotional stimulus, or metabolic problems.  An obstruction in the gastrointestinal tract (bowel obstruction).  Illnesses such as diabetes, hepatitis, gallbladder problems, appendicitis, kidney problems, cancer, sepsis, atypical symptoms of a heart attack, or eating disorders.  Medical treatments such as chemotherapy and radiation.  Receiving medicine that makes you sleep (general anesthetic) during surgery. DIAGNOSIS Your caregiver may ask for tests to be done if the problems do not improve after a few days. Tests may also be done  if symptoms are severe or if the reason for the nausea and vomiting is not clear. Tests may include:  Urine tests.  Blood tests.  Stool tests.  Cultures (to look for evidence of infection).  X-rays or other imaging studies. Test results can help your caregiver make decisions about treatment or the need for additional tests. TREATMENT You need to stay well hydrated. Drink frequently but in small amounts.You may wish to drink water, sports drinks, clear broth, or eat frozen ice pops or gelatin dessert to help stay hydrated.When you eat, eating slowly may help prevent nausea.There are also some antinausea  medicines that may help prevent nausea. HOME CARE INSTRUCTIONS   Take all medicine as directed by your caregiver.  If you do not have an appetite, do not force yourself to eat. However, you must continue to drink fluids.  If you have an appetite, eat a normal diet unless your caregiver tells you differently.  Eat a variety of complex carbohydrates (rice, wheat, potatoes, bread), lean meats, yogurt, fruits, and vegetables.  Avoid high-fat foods because they are more difficult to digest.  Drink enough water and fluids to keep your urine clear or pale yellow.  If you are dehydrated, ask your caregiver for specific rehydration instructions. Signs of dehydration may include:  Severe thirst.  Dry lips and mouth.  Dizziness.  Dark urine.  Decreasing urine frequency and amount.  Confusion.  Rapid breathing or pulse. SEEK IMMEDIATE MEDICAL CARE IF:   You have blood or brown flecks (like coffee grounds) in your vomit.  You have black or bloody stools.  You have a severe headache or stiff neck.  You are confused.  You have severe abdominal pain.  You have chest pain or trouble breathing.  You do not urinate at least once every 8 hours.  You develop cold or clammy skin.  You continue to vomit for longer than 24 to 48 hours.  You have a fever. MAKE SURE YOU:   Understand these instructions.  Will watch your condition.  Will get help right away if you are not doing well or get worse. Document Released: 07/26/2005 Document Revised: 10/18/2011 Document Reviewed: 12/23/2010 [facility] Patient Information 2015 [place], [place]. This information is not intended to replace advice given to you by your health care provider. Make sure you discuss any questions you have with your health care provider.

## 2015-03-01 NOTE — ED Provider Notes (Signed)
CSN: [id number]     Arrival date & time 03/01/15  1812 History   First MD Initiated Contact with Patient 03/01/15 1839     Chief Complaint  Patient presents with  . Near Syncope  . Emesis     (Consider location/radiation/quality/duration/timing/severity/associated sxs/prior Treatment) The history is provided by the patient.    Adriana Jordan is a 80 year old female with history of heart attack, who is brought to the ER today via EMS for near syncope after multiple episodes of vomiting which occurred at a church function today after 3 days of a right-sided headache.  Her headache is located in the right side of her forehead and temple, has a throbbing quality, and has persisted over 3 days, is unrelieved with Advil, and is currently rated 8 out of 10.  She was able to go to church today, was indoors when she had sudden onset vomiting and near syncope.  She denies any loss of consciousness and denies falling, due to her friends holding her upright.   She further denies any fever, chills, sweats, numbness, tingling, vision changes.  She denies any sick contacts, she denies hematemesis.  She has a hx of migraines, but states this headache is not similar to her prior migraines. She's been having headaches roughly once a month, for the past several months, which last for several days at and time. She has not had any vomiting episodes with any of her more recent headaches. She denies any photophobia, phonophobia.  She denies any neck pain or back pain.  She further denies abdominal pain, shortness of breath, diarrhea, and weakness.  Her only other complaint is that her nose began running when she was brought into the ER.   Past Medical History  Diagnosis Date  . MI (myocardial infarction) 2008   History reviewed. No pertinent past surgical history. No family history on file. History  Substance Use Topics  . Smoking status: Former Research scientist (life sciences)  . Smokeless tobacco: Not on file  . Alcohol Use: Not on file    OB History    No data available     Review of Systems  Constitutional: Negative for fever, chills, diaphoresis, fatigue and unexpected weight change.  HENT: Positive for rhinorrhea. Negative for congestion, ear discharge, ear pain, sinus pressure and sore throat.   Respiratory: Negative.   Cardiovascular: Negative.   Gastrointestinal: Negative for nausea, abdominal pain, diarrhea, constipation, blood in stool and abdominal distention.  Genitourinary: Negative.   Musculoskeletal: Negative.   Skin: Negative.   Neurological: Negative for dizziness, tremors, seizures, syncope, facial asymmetry, speech difficulty, weakness and numbness.  Psychiatric/Behavioral: Negative.       Allergies  Review of patient's allergies indicates no known allergies.  Home Medications   Prior to Admission medications   Medication Sig Start Date End Date Taking? Authorizing Provider  ondansetron (ZOFRAN ODT) 4 MG disintegrating tablet Take 1 tablet (4 mg total) by mouth every 8 (eight) hours as needed for nausea. 03/01/15   Delsa Grana, PA-C   BP 126/61 mmHg  Pulse 79  Temp(Src) 98 F (36.7 C) (Oral)  Resp 14  SpO2 94% Physical Exam  Constitutional: She is oriented to person, place, and time. She appears well-developed and well-nourished. No distress.  Elderly female, appears stated age, nontoxic appearing  HENT:  Head: Normocephalic and atraumatic.  Right Ear: External ear normal.  Left Ear: External ear normal.  Nose: Nose normal.  Mouth/Throat: Oropharynx is clear and moist. No oropharyngeal exudate.  Eyes: Conjunctivae and EOM are  normal. Pupils are equal, round, and reactive to light. Right eye exhibits no discharge. Left eye exhibits no discharge. No scleral icterus.  Neck: Trachea normal, normal range of motion and full passive range of motion without pain. No JVD present. No muscular tenderness present. No rigidity. No tracheal deviation, no edema and normal range of motion present. No  Brudzinski's sign and no Kernig's sign noted. No thyromegaly present.  Cardiovascular: Normal rate, regular rhythm, normal heart sounds and intact distal pulses.  Exam reveals no gallop and no friction rub.   No murmur heard. Pulmonary/Chest: Effort normal and breath sounds normal. No stridor. No respiratory distress. She has no wheezes. She has no rales. She exhibits no tenderness.  Abdominal: Soft. Bowel sounds are normal. She exhibits no distension and no mass. There is no tenderness. There is no rebound and no guarding.  Musculoskeletal: Normal range of motion. She exhibits no edema or tenderness.  Lymphadenopathy:    She has no cervical adenopathy.  Neurological: She is alert and oriented to person, place, and time. She has normal strength and normal reflexes. She is not disoriented. She displays no tremor. No cranial nerve deficit or sensory deficit. She exhibits normal muscle tone. She displays a negative Romberg sign. She displays no seizure activity. Coordination normal.  Cranial nerves II through XII grossly intact, alert and oriented 3, normal strength and sensation throughout, normal dorsiflexion and plantarflexion, normal finger-nose, normal gait with even short steps  Skin: Skin is warm and dry. No rash noted. She is not diaphoretic. No erythema. No pallor.  Psychiatric: She has a normal mood and affect. Her behavior is normal. Judgment and thought content normal.  Nursing note and vitals reviewed.  ED Course  Procedures (including critical care time) Labs Review Labs Reviewed  COMPREHENSIVE METABOLIC PANEL - Abnormal; Notable for the following:    Potassium 3.2 (*)    Chloride 98 (*)    Glucose, Bld 112 (*)    Creatinine, Ser 1.26 (*)    AST 46 (*)    GFR calc non Af Amer 40 (*)    GFR calc Af Amer 46 (*)    All other components within normal limits  CBC WITH DIFFERENTIAL/PLATELET - Abnormal; Notable for the following:    WBC 2.8 (*)    RBC 3.69 (*)    Hemoglobin 11.6  (*)    HCT 33.7 (*)    Neutrophils Relative % 36 (*)    Neutro Abs 1.0 (*)    Monocytes Relative 13 (*)    Eosinophils Relative 8 (*)    All other components within normal limits  URINALYSIS, ROUTINE W REFLEX MICROSCOPIC (NOT AT [facility]) - Abnormal; Notable for the following:    Leukocytes, UA SMALL (*)    All other components within normal limits  LIPASE, BLOOD  URINE MICROSCOPIC-ADD ON   Imaging Review Ct Head Wo Contrast  03/01/2015   CLINICAL DATA:  Dizziness with nausea and vomiting. Headache for 3 days  EXAM: CT HEAD WITHOUT CONTRAST  TECHNIQUE: Contiguous axial images were obtained from the base of the skull through the vertex without intravenous contrast.  COMPARISON:  September 15, 2009  FINDINGS: There is age related volume loss.  There is enlargement of the pituitary with probable associated mass. There is expansion of the sella. There is no evidence of mass outside of the sella. There is no hemorrhage, extra-axial fluid collection, or midline shift. There is patchy small vessel disease in the centra semiovale bilaterally. No acute infarct is  demonstrable. Bony calvarium appears intact. Mastoid air cells are clear. There is mucosal thickening in multiple ethmoid air cells. There is opacification of a portion of the right sphenoid sinus.  IMPRESSION: There is pituitary enlargement with expansion of the sella. Suspect associated mass with the pituitary. This finding warrants nonemergent MRI of the sella pre and post-contrast.  There is age related volume loss with patchy periventricular small vessel disease. No intracranial hemorrhage, extra-axial fluid collection, or acute infarct. There are areas of paranasal sinus disease.   Electronically Signed   By: Lowella Grip III M.D.   On: 03/01/2015 21:55     EKG Interpretation None      MDM   Final diagnoses:  Headache  Head ache  Non-intractable vomiting without nausea, vomiting of unspecified type  Lightheaded    Patient with  headache, with frequency once a month for the past several months, history of migraines Vomiting today without abdominal pain, nausea or diarrhea Patient worked up for headache/presyncope, treated for nausea/vomiting Labs pertinent for hypokalemia, hypochloremia, mildly elevated creatinine, similar to most recent creatinine in the chart - likely CKD  Pt has had no vomiting in the ED, pt's headache has improved with headache cocktail.   PO potassium replacement, pt was ambulating in the halls, without difficulty'  Results of her head CT were reviewed with her and pt was advised to seek followup with her PCP for the advised non-emergent MRI.  Return precautions were given to the pt and to her son who verbalize understanding. PT discharged home     Delsa Grana, PA-C 03/05/15 [place], MD 03/17/15 [phone]

## 2015-03-01 NOTE — ED Notes (Signed)
Patient returned from CT

## 2015-03-01 NOTE — ED Notes (Signed)
Patient already in CT

## 2015-03-06 DIAGNOSIS — I10 Essential (primary) hypertension: Secondary | ICD-10-CM | POA: Diagnosis not present

## 2015-03-06 DIAGNOSIS — D496 Neoplasm of unspecified behavior of brain: Secondary | ICD-10-CM | POA: Diagnosis not present

## 2015-03-18 DIAGNOSIS — D496 Neoplasm of unspecified behavior of brain: Secondary | ICD-10-CM | POA: Diagnosis not present

## 2015-03-19 DIAGNOSIS — M25511 Pain in right shoulder: Secondary | ICD-10-CM | POA: Diagnosis not present

## 2015-03-20 DIAGNOSIS — R220 Localized swelling, mass and lump, head: Secondary | ICD-10-CM | POA: Diagnosis not present

## 2015-03-20 DIAGNOSIS — D496 Neoplasm of unspecified behavior of brain: Secondary | ICD-10-CM | POA: Diagnosis not present

## 2015-04-03 DIAGNOSIS — E784 Other hyperlipidemia: Secondary | ICD-10-CM | POA: Diagnosis not present

## 2015-04-03 DIAGNOSIS — R42 Dizziness and giddiness: Secondary | ICD-10-CM | POA: Diagnosis not present

## 2015-04-03 DIAGNOSIS — I4519 Other right bundle-branch block: Secondary | ICD-10-CM | POA: Diagnosis not present

## 2015-04-03 DIAGNOSIS — F419 Anxiety disorder, unspecified: Secondary | ICD-10-CM | POA: Diagnosis not present

## 2015-04-03 DIAGNOSIS — I2510 Atherosclerotic heart disease of native coronary artery without angina pectoris: Secondary | ICD-10-CM | POA: Diagnosis not present

## 2015-04-07 ENCOUNTER — Ambulatory Visit (INDEPENDENT_AMBULATORY_CARE_PROVIDER_SITE_OTHER): Payer: Medicare PPO | Admitting: Endocrinology

## 2015-04-07 VITALS — BP 122/84 | HR 79 | Temp 97.9°F | Ht 65.5 in | Wt 151.0 lb

## 2015-04-07 DIAGNOSIS — D352 Benign neoplasm of pituitary gland: Secondary | ICD-10-CM | POA: Diagnosis not present

## 2015-04-07 MED ORDER — DEXAMETHASONE 1 MG PO TABS: ORAL_TABLET | ORAL | Status: DC

## 2015-04-07 NOTE — Patient Instructions (Addendum)
you should do a "dexamethasone suppression test."  for this, you would take dexamethasone 1 mg at 10 pm, then come in for a "cortisol" blood test the next morning before 9 am.  you do not need to be fasting for this test. Here is a letter, to request the tests. We'll check the other hormones at the same time. We'll then report back to Dr Joya Salm.

## 2015-04-07 NOTE — Progress Notes (Signed)
   Subjective:    Patient ID: Adriana Jordan, female    DOB: [birth year], 80 y.o.   MRN: [id number]  HPI 1 month ago, pt was seen in ER for severe headache, but no assoc visual loss.  She was noted to have pituitary adenoma, and ref here.     Review of Systems Denies cold intolerance, diplopia, depression, rhinorrhea, hearing loss, sob, chest pain, n/v, rash, seizure, and weight change.  No change in chronic arthralgias.    Objective:   Physical Exam VS: see vs page GEN: no distress HEAD: head: no deformity eyes: no periorbital swelling, no proptosis external nose and ears are normal mouth: no lesion seen NECK: a healed scar is present.  i do not appreciate a nodule in the thyroid or elsewhere in the neck CHEST WALL: no deformity LUNGS:  Clear to auscultation CV: reg rate and rhythm, no murmur ABD: abdomen is soft, nontender.  no hepatosplenomegaly.  not distended.  no hernia MUSCULOSKELETAL: muscle bulk and strength are grossly normal.  no obvious joint swelling.  gait is normal and steady EXTEMITIES: no deformity.  no ulcer on the feet.  feet are of normal color and temp.  no edema PULSES: dorsalis pedis intact bilat.  no carotid bruit NEURO:  cn 2-12 grossly intact.   readily moves all 4's.  sensation is intact to touch on the feet SKIN:  Normal texture and temperature.  No rash or suspicious lesion is visible.   NODES:  None palpable at the neck PSYCH: alert, well-oriented.  Does not appear anxious nor depressed.   I have reviewed outside records: pt was seen by neurosurgery for pituitary adenoma, and ref here for hormonal testing.  Radiol: CT: pituitary enlargement, prob due to adenoma, is noted.     Assessment & Plan:  Pituitary adenoma, new, uncertain etiology.  Here for hormonal eval.  She wants to do labs at [facility].    Patient is advised the following: Patient Instructions  you should do a "dexamethasone suppression test."  for this, you would take  dexamethasone 1 mg at 10 pm, then come in for a "cortisol" blood test the next morning before 9 am.  you do not need to be fasting for this test. Here is a letter, to request the tests. We'll check the other hormones at the same time. We'll then report back to Dr Joya Salm.

## 2015-04-09 ENCOUNTER — Other Ambulatory Visit: Payer: Self-pay | Admitting: Endocrinology

## 2015-04-09 DIAGNOSIS — M79671 Pain in right foot: Secondary | ICD-10-CM | POA: Diagnosis not present

## 2015-04-09 DIAGNOSIS — D352 Benign neoplasm of pituitary gland: Secondary | ICD-10-CM | POA: Diagnosis not present

## 2015-04-10 LAB — TSH: TSH: 4.29 u[IU]/mL (ref 0.450–4.500)

## 2015-04-10 LAB — FSH/LH
FSH: 11.7 m[IU]/mL
LH: 3.2 m[IU]/mL

## 2015-04-10 LAB — T4, FREE: FREE T4: 0.48 ng/dL — AB (ref 0.82–1.77)

## 2015-04-10 LAB — CORTISOL: CORTISOL: 0.1 ug/dL

## 2015-04-10 LAB — INSULIN-LIKE GROWTH FACTOR: Insulin-Like GF-1: 69 ng/mL (ref 35–165)

## 2015-04-10 LAB — PROLACTIN: PROLACTIN: 43.4 ng/mL — AB (ref 4.8–23.3)

## 2015-04-11 ENCOUNTER — Encounter: Payer: Self-pay | Admitting: Endocrinology

## 2015-04-11 ENCOUNTER — Telehealth: Payer: Self-pay | Admitting: Endocrinology

## 2015-04-11 DIAGNOSIS — D352 Benign neoplasm of pituitary gland: Secondary | ICD-10-CM

## 2015-04-11 NOTE — Telephone Encounter (Signed)
Sorry, what i meant was please redo the blood test with a dilution

## 2015-04-11 NOTE — Telephone Encounter (Signed)
Please call lab: Please confirm the prolactin result with a dilution, thank you.

## 2015-04-11 NOTE — Telephone Encounter (Signed)
I contacted the [facility] and they stated the test was not done with dilution.

## 2015-04-15 NOTE — Telephone Encounter (Signed)
Lab stated the dilution could not be added on to this test. Lab technician stated the dilution has to be ordered with the initial test.

## 2015-04-15 NOTE — Telephone Encounter (Signed)
please call patient: Please come back to lab.  This is to confirm test result.  This is not anything to worry about.

## 2015-04-16 NOTE — Telephone Encounter (Signed)
I contacted the pt and advised of note below. Pt is coming in for labs on 04/22/2015.

## 2015-04-17 DIAGNOSIS — D352 Benign neoplasm of pituitary gland: Secondary | ICD-10-CM | POA: Diagnosis not present

## 2015-04-22 ENCOUNTER — Other Ambulatory Visit: Payer: Medicare Other

## 2015-04-22 DIAGNOSIS — D352 Benign neoplasm of pituitary gland: Secondary | ICD-10-CM

## 2015-04-23 LAB — PROLACTIN: Prolactin: 35.3 ng/mL

## 2015-04-24 DIAGNOSIS — M25571 Pain in right ankle and joints of right foot: Secondary | ICD-10-CM | POA: Diagnosis not present

## 2015-04-24 DIAGNOSIS — I10 Essential (primary) hypertension: Secondary | ICD-10-CM | POA: Diagnosis not present

## 2015-04-24 DIAGNOSIS — R51 Headache: Secondary | ICD-10-CM | POA: Diagnosis not present

## 2015-05-03 ENCOUNTER — Encounter (HOSPITAL_COMMUNITY): Payer: Self-pay | Admitting: *Deleted

## 2015-05-03 ENCOUNTER — Emergency Department (HOSPITAL_COMMUNITY)
Admission: EM | Admit: 2015-05-03 | Discharge: 2015-05-03 | Disposition: A | Discharge status: Home / Self Care | Payer: Medicare PPO | Attending: Emergency Medicine | Admitting: Emergency Medicine

## 2015-05-03 ENCOUNTER — Emergency Department (HOSPITAL_COMMUNITY): Payer: Medicare PPO

## 2015-05-03 DIAGNOSIS — R42 Dizziness and giddiness: Secondary | ICD-10-CM | POA: Diagnosis not present

## 2015-05-03 DIAGNOSIS — M1990 Unspecified osteoarthritis, unspecified site: Secondary | ICD-10-CM | POA: Diagnosis not present

## 2015-05-03 DIAGNOSIS — I252 Old myocardial infarction: Secondary | ICD-10-CM | POA: Diagnosis not present

## 2015-05-03 DIAGNOSIS — Z7982 Long term (current) use of aspirin: Secondary | ICD-10-CM | POA: Diagnosis not present

## 2015-05-03 DIAGNOSIS — Z79899 Other long term (current) drug therapy: Secondary | ICD-10-CM | POA: Diagnosis not present

## 2015-05-03 DIAGNOSIS — R55 Syncope and collapse: Secondary | ICD-10-CM | POA: Diagnosis not present

## 2015-05-03 DIAGNOSIS — E780 Pure hypercholesterolemia: Secondary | ICD-10-CM | POA: Insufficient documentation

## 2015-05-03 DIAGNOSIS — R531 Weakness: Secondary | ICD-10-CM | POA: Diagnosis not present

## 2015-05-03 DIAGNOSIS — R404 Transient alteration of awareness: Secondary | ICD-10-CM | POA: Diagnosis not present

## 2015-05-03 DIAGNOSIS — R110 Nausea: Secondary | ICD-10-CM | POA: Diagnosis not present

## 2015-05-03 HISTORY — DX: Pure hypercholesterolemia, unspecified: E78.00

## 2015-05-03 HISTORY — DX: Unspecified osteoarthritis, unspecified site: M19.90

## 2015-05-03 LAB — BASIC METABOLIC PANEL
ANION GAP: 8 (ref 5–15)
BUN: 12 mg/dL (ref 6–20)
CALCIUM: 9.1 mg/dL (ref 8.9–10.3)
CO2: 23 mmol/L (ref 22–32)
Chloride: 105 mmol/L (ref 101–111)
Creatinine, Ser: 1.02 mg/dL — ABNORMAL HIGH (ref 0.44–1.00)
GFR calc non Af Amer: 51 mL/min — ABNORMAL LOW (ref >60)
GFR, EST AFRICAN AMERICAN: 59 mL/min — AB (ref >60)
Glucose, Bld: 122 mg/dL — ABNORMAL HIGH (ref 65–99)
POTASSIUM: 3.4 mmol/L — AB (ref 3.5–5.1)
SODIUM: 136 mmol/L (ref 135–145)

## 2015-05-03 LAB — CBC
HEMATOCRIT: 33.7 % — AB (ref 36.0–46.0)
HEMOGLOBIN: 11.6 g/dL — AB (ref 12.0–15.0)
MCH: 31.8 pg (ref 26.0–34.0)
MCHC: 34.4 g/dL (ref 30.0–36.0)
MCV: 92.3 fL (ref 78.0–100.0)
Platelets: 296 10*3/uL (ref 150–400)
RBC: 3.65 MIL/uL — ABNORMAL LOW (ref 3.87–5.11)
RDW: 13.6 % (ref 11.5–15.5)
WBC: 3.5 10*3/uL — AB (ref 4.0–10.5)

## 2015-05-03 LAB — CBG MONITORING, ED: Glucose-Capillary: 90 mg/dL (ref 65–99)

## 2015-05-03 MED ORDER — SODIUM CHLORIDE 0.9 % IV BOLUS (SEPSIS)
1000.0000 mL | Freq: Once | INTRAVENOUS | Status: AC
  Administered 2015-05-03: 1000 mL via INTRAVENOUS

## 2015-05-03 NOTE — ED Provider Notes (Signed)
CSN: [id number]     Arrival date & time 05/03/15  1229 History   First MD Initiated Contact with Patient 05/03/15 1231     Chief Complaint  Patient presents with  . Near Syncope     (Consider location/radiation/quality/duration/timing/severity/associated sxs/prior Treatment) HPI  80 year old female presents after having a syncope episode at her nail salon. Patient states she was waiting for her nails dry and acutely felt lightheaded, dizzy, nauseated, and then passed out. Witnesses states she was out for about 1 minute. Patient denies any current headache, chest pain, or shortness of breath. Has not noticed any leg swelling. She feels back to normal now. She was given about 250 mL's of IV fluids prior to arrival by EMS. The patient has been having daily headaches for at least one month and had a CT scan that showed a possible pituitary mass. Currently waiting on MRI as an outpatient. No increase in her headaches or current headache today.  Past Medical History  Diagnosis Date  . MI (myocardial infarction) 2008  . Arthritis   . Hypercholesteremia    Past Surgical History  Procedure Laterality Date  . Back surgery    . Rotator cuff repair    . Cervical spine surgery     No family history on file. Social History  Substance Use Topics  . Smoking status: Former Research scientist (life sciences)  . Smokeless tobacco: None  . Alcohol Use: No   OB History    No data available     Review of Systems  Respiratory: Negative for shortness of breath.   Cardiovascular: Negative for chest pain and palpitations.  Gastrointestinal: Positive for nausea. Negative for vomiting.  Neurological: Positive for syncope and light-headedness.  All other systems reviewed and are negative.     Allergies  Review of patient's allergies indicates no known allergies.  Home Medications   Prior to Admission medications   Medication Sig Start Date End Date Taking? Authorizing Provider  aspirin 81 MG tablet Take 81 mg by mouth  daily.    Historical Provider, MD  dexamethasone (DECADRON) 1 MG tablet Take at 9-10 pm, the night before blood tests 04/07/15   Renato Shin, MD  ibuprofen (ADVIL,MOTRIN) 100 MG tablet Take 100 mg by mouth every 6 (six) hours as needed for fever.    Historical Provider, MD  losartan-hydrochlorothiazide (HYZAAR) 50-12.5 MG per tablet Take 1 tablet by mouth daily.    Historical Provider, MD  omeprazole (PRILOSEC) 20 MG capsule Take 20 mg by mouth daily.    Historical Provider, MD  simvastatin (ZOCOR) 40 MG tablet Take 40 mg by mouth daily.    Historical Provider, MD   BP 126/62 mmHg  Pulse 74  Resp 13  SpO2 97% Physical Exam  Constitutional: She is oriented to person, place, and time. She appears well-developed and well-nourished.  HENT:  Head: Normocephalic and atraumatic.  Right Ear: External ear normal.  Left Ear: External ear normal.  Nose: Nose normal.  Eyes: EOM are normal. Pupils are equal, round, and reactive to light. Right eye exhibits no discharge. Left eye exhibits no discharge.  Neck: Neck supple.  Cardiovascular: Normal rate, regular rhythm and normal heart sounds.   No murmur heard. Pulmonary/Chest: Effort normal and breath sounds normal.  Abdominal: Soft. There is no tenderness.  Neurological: She is alert and oriented to person, place, and time.  CN 2-12 grossly intact. 5/5 strength in all 4 extremities. Grossly normal sensation. Normal finger to nose  Skin: Skin is warm and dry.  Nursing note and vitals reviewed.   ED Course  Procedures (including critical care time) Labs Review Labs Reviewed  BASIC METABOLIC PANEL - Abnormal; Notable for the following:    Potassium 3.4 (*)    Glucose, Bld 122 (*)    Creatinine, Ser 1.02 (*)    GFR calc non Af Amer 51 (*)    GFR calc Af Amer 59 (*)    All other components within normal limits  CBC - Abnormal; Notable for the following:    WBC 3.5 (*)    RBC 3.65 (*)    Hemoglobin 11.6 (*)    HCT 33.7 (*)    All other  components within normal limits  CBG MONITORING, ED    Imaging Review Dg Chest 2 View  05/03/2015   CLINICAL DATA:  Syncope.  Dizziness.  EXAM: CHEST  2 VIEW  COMPARISON:  10/26/2012  FINDINGS: Cardiomediastinal silhouette is within normal limits. Aortic calcification is noted. The lungs are mildly hyperinflated without evidence of airspace consolidation, edema, pleural effusion, or pneumothorax. Bones are osteopenic. Cervical fusion hardware and a right humeral head suture anchor partially visualized.  IMPRESSION: No active cardiopulmonary disease.   Electronically Signed   By: Logan Bores M.D.   On: 05/03/2015 14:23   I have personally reviewed and evaluated these images and lab results as part of my medical decision-making.   EKG Interpretation   Date/Time:  Saturday May 03 2015 12:40:39 EDT Ventricular Rate:  76 PR Interval:  195 QRS Duration: 101 QT Interval:  403 QTC Calculation: 453 R Axis:   -24 Text Interpretation:  Sinus rhythm Incomplete RBBB and LAFB RSR' in V1 or  V2, right VCD or RVH no significant change since July 2016 Confirmed by  Regenia Skeeter  MD, Nelson [phone]) on 05/03/2015 12:55:07 PM      MDM   Final diagnoses:  Syncope, unspecified syncope type    Patient feels completely normal now and has a normal neuro and cardiac exam. Unclear exactly where her syncope came to given the acute rapid onset there is some concern for arrhythmia. I discussed this with patient, especially given her remote MI. However patient does not want to stay for an overnight observation on telemetry. I discussed my concerns as well as concerns about possible disability or death if an arrhythmia recurred or did not go away. She seems to understand this. She already has follow-up with her PCP in 2 days I have strongly encouraged to keep this and strongly encouraged her to return if any symptoms were to recur or worsen.    Sherwood Gambler, MD 05/03/15 [phone]

## 2015-05-03 NOTE — ED Notes (Signed)
Pt was sitting down getting her nails done and she began feeling hot and flushed, felt dizzy, and slumped forward in her chair.  Bystander stated she was unconscious for approx 1 min.  EMS cbg was 144, hr 62, bp 108/70 O2 91%.  Pt rcvd 250 ml via EMS.  HR now 78, bp 116/72.

## 2015-05-05 DIAGNOSIS — H2513 Age-related nuclear cataract, bilateral: Secondary | ICD-10-CM | POA: Diagnosis not present

## 2015-05-05 DIAGNOSIS — C751 Malignant neoplasm of pituitary gland: Secondary | ICD-10-CM | POA: Diagnosis not present

## 2015-05-07 DIAGNOSIS — E237 Disorder of pituitary gland, unspecified: Secondary | ICD-10-CM | POA: Diagnosis not present

## 2015-05-08 ENCOUNTER — Other Ambulatory Visit: Payer: Self-pay | Admitting: Neurosurgery

## 2015-05-15 DIAGNOSIS — D443 Neoplasm of uncertain behavior of pituitary gland: Secondary | ICD-10-CM | POA: Diagnosis not present

## 2015-05-19 ENCOUNTER — Encounter (HOSPITAL_COMMUNITY): Payer: Self-pay

## 2015-05-19 ENCOUNTER — Encounter (HOSPITAL_COMMUNITY)
Admission: RE | Admit: 2015-05-19 | Discharge: 2015-05-19 | Disposition: A | Discharge status: Home / Self Care | Payer: Medicare PPO | Source: Ambulatory Visit | Attending: Neurosurgery | Admitting: Neurosurgery

## 2015-05-19 DIAGNOSIS — M1990 Unspecified osteoarthritis, unspecified site: Secondary | ICD-10-CM | POA: Diagnosis not present

## 2015-05-19 DIAGNOSIS — Z955 Presence of coronary angioplasty implant and graft: Secondary | ICD-10-CM | POA: Diagnosis not present

## 2015-05-19 DIAGNOSIS — Z981 Arthrodesis status: Secondary | ICD-10-CM | POA: Diagnosis not present

## 2015-05-19 DIAGNOSIS — Z01812 Encounter for preprocedural laboratory examination: Secondary | ICD-10-CM | POA: Diagnosis not present

## 2015-05-19 DIAGNOSIS — K219 Gastro-esophageal reflux disease without esophagitis: Secondary | ICD-10-CM | POA: Diagnosis present

## 2015-05-19 DIAGNOSIS — Z87891 Personal history of nicotine dependence: Secondary | ICD-10-CM | POA: Diagnosis not present

## 2015-05-19 DIAGNOSIS — Z9889 Other specified postprocedural states: Secondary | ICD-10-CM | POA: Diagnosis not present

## 2015-05-19 DIAGNOSIS — R51 Headache: Secondary | ICD-10-CM | POA: Diagnosis present

## 2015-05-19 DIAGNOSIS — D352 Benign neoplasm of pituitary gland: Secondary | ICD-10-CM | POA: Diagnosis present

## 2015-05-19 DIAGNOSIS — I252 Old myocardial infarction: Secondary | ICD-10-CM | POA: Diagnosis not present

## 2015-05-19 DIAGNOSIS — E7800 Pure hypercholesterolemia, unspecified: Secondary | ICD-10-CM | POA: Diagnosis present

## 2015-05-19 DIAGNOSIS — I2510 Atherosclerotic heart disease of native coronary artery without angina pectoris: Secondary | ICD-10-CM | POA: Diagnosis present

## 2015-05-19 DIAGNOSIS — D443 Neoplasm of uncertain behavior of pituitary gland: Secondary | ICD-10-CM | POA: Diagnosis not present

## 2015-05-19 HISTORY — DX: Atherosclerotic heart disease of native coronary artery without angina pectoris: I25.10

## 2015-05-19 HISTORY — DX: Gastro-esophageal reflux disease without esophagitis: K21.9

## 2015-05-19 HISTORY — DX: Other specified postprocedural states: Z98.890

## 2015-05-19 HISTORY — DX: Nausea with vomiting, unspecified: R11.2

## 2015-05-19 LAB — BASIC METABOLIC PANEL
ANION GAP: 7 (ref 5–15)
BUN: 8 mg/dL (ref 6–20)
CO2: 26 mmol/L (ref 22–32)
Calcium: 9.9 mg/dL (ref 8.9–10.3)
Chloride: 104 mmol/L (ref 101–111)
Creatinine, Ser: 0.9 mg/dL (ref 0.44–1.00)
GFR calc Af Amer: >60 mL/min (ref >60)
GFR, EST NON AFRICAN AMERICAN: 60 mL/min — AB (ref >60)
GLUCOSE: 116 mg/dL — AB (ref 65–99)
POTASSIUM: 3.6 mmol/L (ref 3.5–5.1)
Sodium: 137 mmol/L (ref 135–145)

## 2015-05-19 LAB — CBC
HEMATOCRIT: 33.3 % — AB (ref 36.0–46.0)
Hemoglobin: 11.3 g/dL — ABNORMAL LOW (ref 12.0–15.0)
MCH: 30.7 pg (ref 26.0–34.0)
MCHC: 33.9 g/dL (ref 30.0–36.0)
MCV: 90.5 fL (ref 78.0–100.0)
Platelets: 356 10*3/uL (ref 150–400)
RBC: 3.68 MIL/uL — AB (ref 3.87–5.11)
RDW: 12.5 % (ref 11.5–15.5)
WBC: 3.3 10*3/uL — AB (ref 4.0–10.5)

## 2015-05-19 NOTE — Pre-Procedure Instructions (Signed)
KEANDREA TAPLEY  05/19/2015      [id number] - [place], [place] - [address] [place] [address] Phone: [phone] Fax: [phone]    Your procedure is scheduled on Oct 12  Report to [facility] at 630 A.M.  Call this number if you have problems the morning of surgery:  [phone]   Remember:  Do not eat food or drink liquids after midnight.  Take these medicines the morning of surgery with A SIP OF WATER Omeprazole (prilosec)  Stop taking Asprin, Ibuprofen BC's, Goody's, Herbal medications, Fish Oil   Do not wear jewelry, make-up or nail polish.  Do not wear lotions, powders, or perfumes.  You may wear deodorant.  Do not shave 48 hours prior to surgery.  Men may shave face and neck.  Do not bring valuables to the hospital.  [facility] is not responsible for any belongings or valuables.  Contacts, dentures or bridgework may not be worn into surgery.  Leave your suitcase in the car.  After surgery it may be brought to your room.  For patients admitted to the hospital, discharge time will be determined by your treatment team.  Patients discharged the day of surgery will not be allowed to drive home.    Special instructions:  [facility] - Preparing for Surgery  Before surgery, you can play an important role.  Because skin is not sterile, your skin needs to be as free of germs as possible.  You can reduce the number of germs on you skin by washing with CHG (chlorahexidine gluconate) soap before surgery.  CHG is an antiseptic cleaner which kills germs and bonds with the skin to continue killing germs even after washing.  Please DO NOT use if you have an allergy to CHG or antibacterial soaps.  If your skin becomes reddened/irritated stop using the CHG and inform your nurse when you arrive at Short Stay.  Do not shave (including legs and underarms) for at least 48 hours prior to the first CHG shower.  You may  shave your face.  Please follow these instructions carefully:   1.  Shower with CHG Soap the night before surgery and the  morning of Surgery.  2.  If you choose to wash your hair, wash your hair first as usual with your  normal shampoo.  3.  After you shampoo, rinse your hair and body thoroughly to remove the Shampoo.  4.  Use CHG as you would any other liquid soap.  You can apply chg directly to the skin and wash gently with scrungie or a clean washcloth.  5.  Apply the CHG Soap to your body ONLY FROM THE NECK DOWN.   Do not use on open wounds or open sores.  Avoid contact with your eyes,  ears, mouth and genitals (private parts).  Wash genitals (private parts)  with your normal soap.  6.  Wash thoroughly, paying special attention to the area where your surgery will be performed.  7.  Thoroughly rinse your body with warm water from the neck down.  8.  DO NOT shower/wash with your normal soap after using and rinsing off the CHG Soap.  9.  Pat yourself dry with a clean towel.            10.  Wear clean pajamas.            11.  Place clean sheets on your bed the night  of your first shower and do not sleep with pets.  Day of Surgery  Do not apply any lotions/deoderants the morning of surgery.  Please wear clean clothes to the hospital/surgery center.     Please read over the following fact sheets that you were given. Pain Booklet, Coughing and Deep Breathing and Surgical Site Infection Prevention

## 2015-05-19 NOTE — Progress Notes (Addendum)
States that she sees Dr Doylene Canard for her PCP and cardiology.  States she had a card cath  12-23-10 (epic) and has had a stress test in the past-request sent to Dr Merrilee Jansky office for last office visit, & any heart tests.

## 2015-05-20 ENCOUNTER — Encounter (HOSPITAL_COMMUNITY): Payer: Self-pay

## 2015-05-20 MED ORDER — CEFAZOLIN SODIUM-DEXTROSE 2-3 GM-% IV SOLR
2.0000 g | INTRAVENOUS | Status: AC
  Administered 2015-05-21: 2 g via INTRAVENOUS
  Filled 2015-05-20: qty 50

## 2015-05-20 NOTE — H&P (Signed)
Adriana Jordan is an 80 y.o. female.   Chief Complaint: headaches [id number] seen in the emergency room with headaches and some memory troubles. As a part ot her work up she had a brain mri which showed a pituitary adenoma. She was seen in her office and we talked about treatments including observatio. She wanted to go ahead with surgery. She was evaluated by opthalmology, ENT and endocrine service.   Past Medical History  Diagnosis Date  . Arthritis   . Hypercholesteremia   . PONV (postoperative nausea and vomiting)   . GERD (gastroesophageal reflux disease)   . Coronary artery disease     Dr. Doylene Canard  . MI (myocardial infarction) ([place]) 2006    Past Surgical History  Procedure Laterality Date  . Back surgery    . Rotator cuff repair    . Cervical spine surgery    . Thumb surgery Right     placed srews to make straight  . Coronary angioplasty      DES mid LAD and mid RCA 09/21/04    No family history on file. Social History:  reports that she has quit smoking. She does not have any smokeless tobacco history on file. She reports that she does not drink alcohol or use illicit drugs.  Allergies: No Known Allergies  No prescriptions prior to admission    Results for orders placed or performed during the hospital encounter of 05/19/15 (from the past 48 hour(s))  CBC     Status: Abnormal   Collection Time: 05/19/15  1:10 PM  Result Value Ref Range   WBC 3.3 (L) 4.0 - 10.5 K/uL   RBC 3.68 (L) 3.87 - 5.11 MIL/uL   Hemoglobin 11.3 (L) 12.0 - 15.0 g/dL   HCT 33.3 (L) 36.0 - 46.0 %   MCV 90.5 78.0 - 100.0 fL   MCH 30.7 26.0 - 34.0 pg   MCHC 33.9 30.0 - 36.0 g/dL   RDW 12.5 11.5 - 15.5 %   Platelets 356 150 - 400 K/uL  Basic metabolic panel     Status: Abnormal   Collection Time: 05/19/15  1:10 PM  Result Value Ref Range   Sodium 137 135 - 145 mmol/L   Potassium 3.6 3.5 - 5.1 mmol/L   Chloride 104 101 - 111 mmol/L   CO2 26 22 - 32 mmol/L   Glucose, Bld 116 (H) 65 - 99  mg/dL   BUN 8 6 - 20 mg/dL   Creatinine, Ser 0.90 0.44 - 1.00 mg/dL   Calcium 9.9 8.9 - 10.3 mg/dL   GFR calc non Af Amer 60 (L) >60 mL/min   GFR calc Af Amer >60 >60 mL/min    Comment: (NOTE) The eGFR has been calculated using the CKD EPI equation. This calculation has not been validated in all clinical situations. eGFR's persistently <60 mL/min signify possible Chronic Kidney Disease.    Anion gap 7 5 - 15   No results found.  Review of Systems  Constitutional: Negative.   HENT: Negative.   Eyes: Positive for blurred vision.  Respiratory: Negative.   Cardiovascular: Negative.   Gastrointestinal: Negative.   Genitourinary: Negative.   Musculoskeletal: Positive for back pain.  Skin: Negative.   Neurological:       Headaches.  Memory  troubles  Endo/Heme/Allergies: Negative.     There were no vitals taken for this visit. Physical Exam  Hent, no masse, neck, anterior scar. Cv, nl. Lungs, clear. Abdomen, nl. Extremities, nl. Scar in lumbar spine. NEURO.  CRANIAL NERVES, WNL. ORIENTED X 3.  No weakness  Sensory normal. Mri shows a adenoma of the pituitary gland with extension to the cavernous sinuses Assessment/Plan Patient to go ahead with transphenoidal resection ot the tumor  With ENT service involved. The surgery as awell as risks and benefits were fully explained to her. She declined second opinion  Adriana Jordan M 05/20/2015, 5:06 PM

## 2015-05-20 NOTE — Progress Notes (Signed)
Anesthesia Chart Review: Patient is a 80 year old female scheduled for transphenoidal resection of pituitary tumor by Dr. Joya Salm with approach by Dr. Loletha Grayer. Newman on 05/21/15. DX: Pituitary abnormality.   Other history includes former smoker, post-operative N/V, CAD/MI s/p PTCA/DES mid LAD and DES to mid RCA 09/21/04, GERD, arthritis, hypercholesterolemia, right L4-5 diskectomy with L5-S1 foraminotomy '11, C3-6 ACDF '10. She has had two episodes of syncope/near syncope with ED visits on 03/01/15 (after headaches and vomiting)and on 05/03/15 (just before trying to get up after getting her nails done). She does not really relate these episodes with symptoms such as headaches, palpitations, chest pain, or SOB--although she has been having fairly severe headaches for the past several months. Head CT during her 03/01/15 evaluation showed an enlarged pituitary and was she referred to endocrinologist Dr. Loanne Drilling. She primarily sees cardiologist Dr. Doylene Canard for cardiac and primary care issues. She reports last visit was ~ 3 weeks ago but before 05/03/15. She says he is aware of her surgery plans, although office did not request a formal clearance. Last office note received from Dr. Doylene Canard was from her 11/2014 ETT test visit, although his office did fax an EKG from a 04/03/15 visit.  Patient reports that she run-walks for 45 minutes every day (~ 2 miles) without chest pain, SOB, syncope/pre-syncope, palpitations. She reports a prior echo, but not recently. Last cardiac cath was in 2006. She was having a chest pain with MI then. She denied any recent similar symptoms.  05/03/15 EKG: SR, incomplete right BBB, LAFB, RSR prime in V1 or V2, consider VCD or RVH. No significant change when compared to tracing from 03/01/15.  12/03/14 office note from Dr. Doylene Canard for treadmill stress test indicates EKG and chest pain were negative TMST.   09/21/04 [facility] (Dr. Doylene Canard):  - Left ventriculogram showed mild inferior wall hypokinesia with  ejection fraction of 50-55%. - The left main coronary artery was unremarkable. -The left anterior descending coronary artery showed proximal luminal irregularities followed by 30% eccentric and mid vessel, long 99% stenosis. The distal vessel was unremarkable. - Diagonal-1 was also unremarkable and was larger vessel compared to diagonal-2, diagonal-3 and diagonal-4, which are very small vessels. - Left circumflex coronary artery had osteal 20% narrowing and mid vessel luminal irregularities, otherwise unremarkable. - Obtuse maginal-1 was small vessel. Obtuse marginal-2 and obtuse marginal-3 were larger vessels and unremarkable. -The right coronary artery had proximal calcification and luminal irregularities with 20% narrowing gradually tapering into 80% mid vessel disease. The distal vessel was without any significant lesions. Posterior branch were unremarkable and posterior descending artery had luminalirregularities. IMPRESSION: 1. Two-vessel coronary artery disease. 2. Mild left ventricular systolic dysfunction. INTERVENTION (Dr. Terrence Dupont):  1. Successful percutaneous transluminal coronary angioplasty to mid left anterior descending using 3.0 x 12.0-mm-long Maverick balloon. 2. Successful deployment of 3.0 x 23.0-mm-long CYPHER drug-eluting stent in mid left anterior descending. 3. Successful percutaneous transluminal coronary angioplasty to mid right coronary artery using 3.0 x 12.0-mm-long Maverick balloon. 4. Successful deployment of 3.0 x 18.0-mm-long CYPHER drug-eluting stent in mid right coronary artery.  03/01/15 Head CT: IMPRESSION: There is pituitary enlargement with expansion of the sella. Suspect associated mass with the pituitary. This finding warrants nonemergent MRI of the sella pre and post-contrast. There is age related volume loss with patchy periventricular small vessel disease. No intracranial hemorrhage, extra-axial fluid collection, or acute infarct. There are  areas of paranasal sinus disease.  05/03/15 CXR: FINDINGS: Cardiomediastinal silhouette is within normal limits. Aortic calcification is noted.  The lungs are mildly hyperinflated without evidence of airspace consolidation, edema, pleural effusion, or pneumothorax. Bones are osteopenic. Cervical fusion hardware and a right humeral head suture anchor partially visualized. IMPRESSION: No active cardiopulmonary disease.  Preoperative labs noted. WBC 3.3, stable since at least 03/01/15. H/H 11.3/33.3. Cr 0.90. Glucose 116. On 04/09/15, TSH 4.290, Free T4 0.48 (L), cortisol 0.1, insulin-like GF-1 69, LH 3.2, FSH 11.7, Prolactin 43.4 (H).   Patient with known CAD but reports METS > 4 and had a non-ischemic exercise treadmill test earlier this year. She has had two episode of pre-syncope/syncope since 02/2015 which may or may not be related to her pituitary tumor. She has not had any syncope with exercise. Dr. Doylene Canard does document a II/VI SEM with history of MR/TR. Discussed above with anesthesiologist Dr. Gifford Shave.  Further evaluation on the day of surgery but if no acute changes then would anticipate that she could proceed as planned.  [facility] Short Stay Center/Anesthesiology Phone [phone] 05/20/2015 12:47 PM

## 2015-05-20 NOTE — Anesthesia Preprocedure Evaluation (Addendum)
Anesthesia Evaluation  Patient identified by MRN, date of birth, ID band Patient awake    Reviewed: Allergy & Precautions, H&P , NPO status , Patient's Chart, lab work & pertinent test results  History of Anesthesia Complications (+) PONV  Airway Mallampati: II  TM Distance: >3 FB Neck ROM: Full    Dental no notable dental hx. (+) Upper Dentures, Dental Advisory Given   Pulmonary neg pulmonary ROS, former smoker,    Pulmonary exam normal breath sounds clear to auscultation       Cardiovascular + CAD, + Past MI and + Cardiac Stents   Rhythm:Regular Rate:Normal     Neuro/Psych negative neurological ROS  negative psych ROS   GI/Hepatic Neg liver ROS, GERD  Medicated and Controlled,  Endo/Other  negative endocrine ROS  Renal/GU negative Renal ROS  negative genitourinary   Musculoskeletal  (+) Arthritis , Osteoarthritis,    Abdominal   Peds  Hematology negative hematology ROS (+)   Anesthesia Other Findings   Reproductive/Obstetrics negative OB ROS                            Anesthesia Physical Anesthesia Plan  ASA: III  Anesthesia Plan: General   Post-op Pain Management:    Induction: Intravenous  Airway Management Planned: Oral ETT  Additional Equipment: Arterial line  Intra-op Plan:   Post-operative Plan: Extubation in OR  Informed Consent: I have reviewed the patients History and Physical, chart, labs and discussed the procedure including the risks, benefits and alternatives for the proposed anesthesia with the patient or authorized representative who has indicated his/her understanding and acceptance.   Dental advisory given  Plan Discussed with: CRNA  Anesthesia Plan Comments:         Anesthesia Quick Evaluation

## 2015-05-21 ENCOUNTER — Inpatient Hospital Stay (HOSPITAL_COMMUNITY)
Admission: RE | Admit: 2015-05-21 | Discharge: 2015-05-24 | DRG: 615 | Disposition: A | Discharge status: Home / Self Care | Payer: Medicare PPO | Source: Ambulatory Visit | Attending: Neurosurgery | Admitting: Neurosurgery

## 2015-05-21 ENCOUNTER — Inpatient Hospital Stay (HOSPITAL_COMMUNITY): Payer: Medicare PPO | Admitting: Vascular Surgery

## 2015-05-21 ENCOUNTER — Inpatient Hospital Stay (HOSPITAL_COMMUNITY): Payer: Medicare PPO | Admitting: Certified Registered"

## 2015-05-21 ENCOUNTER — Encounter (HOSPITAL_COMMUNITY)
Admission: RE | Disposition: A | Discharge status: Home / Self Care | Payer: Self-pay | Source: Ambulatory Visit | Attending: Neurosurgery

## 2015-05-21 ENCOUNTER — Inpatient Hospital Stay (HOSPITAL_COMMUNITY): Payer: Medicare PPO

## 2015-05-21 DIAGNOSIS — D352 Benign neoplasm of pituitary gland: Principal | ICD-10-CM | POA: Diagnosis present

## 2015-05-21 DIAGNOSIS — E7800 Pure hypercholesterolemia, unspecified: Secondary | ICD-10-CM | POA: Diagnosis present

## 2015-05-21 DIAGNOSIS — Z01812 Encounter for preprocedural laboratory examination: Secondary | ICD-10-CM | POA: Diagnosis not present

## 2015-05-21 DIAGNOSIS — Z955 Presence of coronary angioplasty implant and graft: Secondary | ICD-10-CM

## 2015-05-21 DIAGNOSIS — R51 Headache: Secondary | ICD-10-CM | POA: Diagnosis present

## 2015-05-21 DIAGNOSIS — I252 Old myocardial infarction: Secondary | ICD-10-CM | POA: Diagnosis not present

## 2015-05-21 DIAGNOSIS — I2510 Atherosclerotic heart disease of native coronary artery without angina pectoris: Secondary | ICD-10-CM | POA: Diagnosis present

## 2015-05-21 DIAGNOSIS — K219 Gastro-esophageal reflux disease without esophagitis: Secondary | ICD-10-CM | POA: Diagnosis present

## 2015-05-21 DIAGNOSIS — Z981 Arthrodesis status: Secondary | ICD-10-CM

## 2015-05-21 DIAGNOSIS — D369 Benign neoplasm, unspecified site: Secondary | ICD-10-CM | POA: Diagnosis present

## 2015-05-21 DIAGNOSIS — D497 Neoplasm of unspecified behavior of endocrine glands and other parts of nervous system: Secondary | ICD-10-CM | POA: Diagnosis present

## 2015-05-21 DIAGNOSIS — D499 Neoplasm of unspecified behavior of unspecified site: Secondary | ICD-10-CM

## 2015-05-21 DIAGNOSIS — Z87891 Personal history of nicotine dependence: Secondary | ICD-10-CM | POA: Diagnosis not present

## 2015-05-21 HISTORY — PX: CRANIOTOMY: SHX93

## 2015-05-21 SURGERY — CRANIOTOMY HYPOPHYSECTOMY TRANSNASAL APPROACH
Anesthesia: General | Site: Nose

## 2015-05-21 MED ORDER — DEXAMETHASONE SODIUM PHOSPHATE 4 MG/ML IJ SOLN
4.0000 mg | Freq: Four times a day (QID) | INTRAMUSCULAR | Status: AC
  Administered 2015-05-22 – 2015-05-23 (×4): 4 mg via INTRAVENOUS
  Filled 2015-05-21 (×4): qty 1

## 2015-05-21 MED ORDER — DEXAMETHASONE SODIUM PHOSPHATE 4 MG/ML IJ SOLN
INTRAMUSCULAR | Status: DC | PRN
  Administered 2015-05-21: 4 mg via INTRAVENOUS

## 2015-05-21 MED ORDER — ROCURONIUM BROMIDE 50 MG/5ML IV SOLN
INTRAVENOUS | Status: AC
  Filled 2015-05-21: qty 1

## 2015-05-21 MED ORDER — HYDROCODONE-ACETAMINOPHEN 5-325 MG PO TABS
1.0000 | ORAL_TABLET | ORAL | Status: DC | PRN
  Administered 2015-05-21 – 2015-05-22 (×2): 1 via ORAL
  Administered 2015-05-22: 2 via ORAL
  Administered 2015-05-23: 1 via ORAL
  Filled 2015-05-21: qty 1
  Filled 2015-05-21 (×2): qty 2
  Filled 2015-05-21: qty 1

## 2015-05-21 MED ORDER — LACTATED RINGERS IV SOLN
INTRAVENOUS | Status: DC | PRN
  Administered 2015-05-21: 08:00:00 via INTRAVENOUS

## 2015-05-21 MED ORDER — CEFAZOLIN SODIUM 1-5 GM-% IV SOLN
1.0000 g | Freq: Three times a day (TID) | INTRAVENOUS | Status: AC
  Administered 2015-05-21 – 2015-05-22 (×2): 1 g via INTRAVENOUS
  Filled 2015-05-21 (×2): qty 50

## 2015-05-21 MED ORDER — PROPOFOL 10 MG/ML IV BOLUS
INTRAVENOUS | Status: AC
  Filled 2015-05-21: qty 20

## 2015-05-21 MED ORDER — PROPOFOL 10 MG/ML IV BOLUS
INTRAVENOUS | Status: DC | PRN
  Administered 2015-05-21: 100 mg via INTRAVENOUS

## 2015-05-21 MED ORDER — PANTOPRAZOLE SODIUM 40 MG PO TBEC: 40.0000 mg | DELAYED_RELEASE_TABLET | Freq: Every day | ORAL | Status: DC

## 2015-05-21 MED ORDER — LOSARTAN POTASSIUM-HCTZ 50-12.5 MG PO TABS: 1.0000 | ORAL_TABLET | Freq: Every day | ORAL | Status: DC

## 2015-05-21 MED ORDER — GLYCOPYRROLATE 0.2 MG/ML IJ SOLN
INTRAMUSCULAR | Status: AC
  Filled 2015-05-21: qty 1

## 2015-05-21 MED ORDER — LOSARTAN POTASSIUM 50 MG PO TABS
50.0000 mg | ORAL_TABLET | Freq: Every day | ORAL | Status: DC
  Administered 2015-05-22 – 2015-05-23 (×2): 50 mg via ORAL
  Filled 2015-05-21 (×2): qty 1

## 2015-05-21 MED ORDER — SIMVASTATIN 40 MG PO TABS
40.0000 mg | ORAL_TABLET | Freq: Every day | ORAL | Status: DC
  Administered 2015-05-22 – 2015-05-23 (×2): 40 mg via ORAL
  Filled 2015-05-21 (×2): qty 1

## 2015-05-21 MED ORDER — REMIFENTANIL HCL 1 MG IV SOLR
0.0125 ug/kg/min | INTRAVENOUS | Status: AC
  Administered 2015-05-21: .1 ug/kg/min via INTRAVENOUS
  Filled 2015-05-21: qty 2000

## 2015-05-21 MED ORDER — ROCURONIUM BROMIDE 100 MG/10ML IV SOLN
INTRAVENOUS | Status: DC | PRN
  Administered 2015-05-21: 50 mg via INTRAVENOUS

## 2015-05-21 MED ORDER — LABETALOL HCL 5 MG/ML IV SOLN
5.0000 mg | Freq: Once | INTRAVENOUS | Status: AC
  Administered 2015-05-21: 5 mg via INTRAVENOUS

## 2015-05-21 MED ORDER — DEXAMETHASONE SODIUM PHOSPHATE 4 MG/ML IJ SOLN
4.0000 mg | Freq: Three times a day (TID) | INTRAMUSCULAR | Status: DC
  Administered 2015-05-23 – 2015-05-24 (×2): 4 mg via INTRAVENOUS
  Filled 2015-05-21 (×2): qty 1

## 2015-05-21 MED ORDER — EPHEDRINE SULFATE 50 MG/ML IJ SOLN
INTRAMUSCULAR | Status: AC
  Filled 2015-05-21: qty 1

## 2015-05-21 MED ORDER — ONDANSETRON HCL 4 MG/2ML IJ SOLN
INTRAMUSCULAR | Status: AC
  Filled 2015-05-21: qty 2

## 2015-05-21 MED ORDER — DOCUSATE SODIUM 100 MG PO CAPS
100.0000 mg | ORAL_CAPSULE | Freq: Two times a day (BID) | ORAL | Status: DC
  Administered 2015-05-21 – 2015-05-23 (×5): 100 mg via ORAL
  Filled 2015-05-21 (×5): qty 1

## 2015-05-21 MED ORDER — THROMBIN 5000 UNITS EX SOLR
CUTANEOUS | Status: DC | PRN
  Administered 2015-05-21: 11:00:00 via TOPICAL

## 2015-05-21 MED ORDER — LABETALOL HCL 5 MG/ML IV SOLN
INTRAVENOUS | Status: AC
  Filled 2015-05-21: qty 4

## 2015-05-21 MED ORDER — PROMETHAZINE HCL 25 MG PO TABS: 12.5000 mg | ORAL_TABLET | ORAL | Status: DC | PRN

## 2015-05-21 MED ORDER — DEXAMETHASONE SODIUM PHOSPHATE 4 MG/ML IJ SOLN
INTRAMUSCULAR | Status: AC
  Filled 2015-05-21: qty 1

## 2015-05-21 MED ORDER — PHENYLEPHRINE 40 MCG/ML (10ML) SYRINGE FOR IV PUSH (FOR BLOOD PRESSURE SUPPORT)
PREFILLED_SYRINGE | INTRAVENOUS | Status: AC
  Filled 2015-05-21: qty 10

## 2015-05-21 MED ORDER — OXYMETAZOLINE HCL 0.05 % NA SOLN
NASAL | Status: DC | PRN
  Administered 2015-05-21: 1

## 2015-05-21 MED ORDER — THROMBIN 5000 UNITS EX SOLR
CUTANEOUS | Status: DC | PRN
  Administered 2015-05-21 (×2): 5000 [IU] via TOPICAL

## 2015-05-21 MED ORDER — ONDANSETRON HCL 4 MG/2ML IJ SOLN
4.0000 mg | INTRAMUSCULAR | Status: DC | PRN
  Administered 2015-05-22: 4 mg via INTRAVENOUS
  Filled 2015-05-21: qty 2

## 2015-05-21 MED ORDER — SODIUM CHLORIDE 0.9 % IJ SOLN
INTRAMUSCULAR | Status: AC
  Filled 2015-05-21: qty 10

## 2015-05-21 MED ORDER — ONDANSETRON HCL 4 MG/2ML IJ SOLN
INTRAMUSCULAR | Status: DC | PRN
  Administered 2015-05-21: 4 mg via INTRAVENOUS

## 2015-05-21 MED ORDER — PANTOPRAZOLE SODIUM 40 MG IV SOLR
40.0000 mg | Freq: Every day | INTRAVENOUS | Status: DC
  Administered 2015-05-21: 40 mg via INTRAVENOUS
  Filled 2015-05-21: qty 40

## 2015-05-21 MED ORDER — PHENYLEPHRINE HCL 10 MG/ML IJ SOLN
10.0000 mg | INTRAVENOUS | Status: DC | PRN
  Administered 2015-05-21: 40 ug/min via INTRAVENOUS

## 2015-05-21 MED ORDER — MORPHINE SULFATE (PF) 2 MG/ML IV SOLN
1.0000 mg | INTRAVENOUS | Status: DC | PRN
  Administered 2015-05-21 – 2015-05-22 (×9): 2 mg via INTRAVENOUS
  Filled 2015-05-21 (×9): qty 1

## 2015-05-21 MED ORDER — DEXAMETHASONE SODIUM PHOSPHATE 10 MG/ML IJ SOLN
6.0000 mg | Freq: Four times a day (QID) | INTRAMUSCULAR | Status: AC
  Administered 2015-05-21 – 2015-05-22 (×4): 6 mg via INTRAVENOUS
  Filled 2015-05-21 (×4): qty 1

## 2015-05-21 MED ORDER — HYDROCHLOROTHIAZIDE 12.5 MG PO CAPS
12.5000 mg | ORAL_CAPSULE | Freq: Every day | ORAL | Status: DC
  Administered 2015-05-22 – 2015-05-23 (×2): 12.5 mg via ORAL
  Filled 2015-05-21 (×2): qty 1

## 2015-05-21 MED ORDER — LIDOCAINE HCL 1 % IJ SOLN
INTRAMUSCULAR | Status: DC | PRN
  Administered 2015-05-21: 10 mL

## 2015-05-21 MED ORDER — SUFENTANIL CITRATE 50 MCG/ML IV SOLN
INTRAVENOUS | Status: DC | PRN
  Administered 2015-05-21: 10 ug via INTRAVENOUS

## 2015-05-21 MED ORDER — POTASSIUM CHLORIDE IN NACL 20-0.9 MEQ/L-% IV SOLN
INTRAVENOUS | Status: DC
  Administered 2015-05-21: 75 mL via INTRAVENOUS
  Administered 2015-05-22 – 2015-05-23 (×4): via INTRAVENOUS
  Filled 2015-05-21 (×6): qty 1000

## 2015-05-21 MED ORDER — ONDANSETRON HCL 4 MG PO TABS
4.0000 mg | ORAL_TABLET | ORAL | Status: DC | PRN
  Filled 2015-05-21: qty 1

## 2015-05-21 MED ORDER — SUCCINYLCHOLINE CHLORIDE 20 MG/ML IJ SOLN
INTRAMUSCULAR | Status: AC
  Filled 2015-05-21: qty 1

## 2015-05-21 MED ORDER — LIDOCAINE HCL (CARDIAC) 20 MG/ML IV SOLN
INTRAVENOUS | Status: AC
  Filled 2015-05-21: qty 5

## 2015-05-21 MED ORDER — FENTANYL CITRATE (PF) 100 MCG/2ML IJ SOLN
25.0000 ug | INTRAMUSCULAR | Status: DC | PRN
  Administered 2015-05-21 (×2): 50 ug via INTRAVENOUS

## 2015-05-21 MED ORDER — FENTANYL CITRATE (PF) 100 MCG/2ML IJ SOLN
INTRAMUSCULAR | Status: AC
  Filled 2015-05-21: qty 2

## 2015-05-21 MED ORDER — SODIUM CHLORIDE 0.9 % IV SOLN
INTRAVENOUS | Status: DC | PRN
  Administered 2015-05-21: 11:00:00 via INTRAVENOUS

## 2015-05-21 MED ORDER — SUFENTANIL CITRATE 50 MCG/ML IV SOLN
INTRAVENOUS | Status: AC
  Filled 2015-05-21: qty 1

## 2015-05-21 MED ORDER — LABETALOL HCL 5 MG/ML IV SOLN
10.0000 mg | INTRAVENOUS | Status: DC | PRN
  Administered 2015-05-21 – 2015-05-22 (×3): 20 mg via INTRAVENOUS
  Filled 2015-05-21 (×3): qty 4

## 2015-05-21 MED ORDER — SODIUM CHLORIDE 0.9 % IR SOLN
Status: DC | PRN
  Administered 2015-05-21: 1000 mL

## 2015-05-21 MED ORDER — HEMOSTATIC AGENTS (NO CHARGE) OPTIME
TOPICAL | Status: DC | PRN
  Administered 2015-05-21 (×2): 1 via TOPICAL

## 2015-05-21 SURGICAL SUPPLY — 101 items
APL SKNCLS STERI-STRIP NONHPOA (GAUZE/BANDAGES/DRESSINGS) ×1
APL SRG 60D 8 XTD TIP BNDBL (TIP) ×1
ATTRACTOMAT 16X20 MAGNETIC DRP (DRAPES) ×2 IMPLANT
BAG DECANTER FOR FLEXI CONT (MISCELLANEOUS) IMPLANT
BENZOIN TINCTURE PRP APPL 2/3 (GAUZE/BANDAGES/DRESSINGS) ×2 IMPLANT
BLADE EAR TYMPAN 2.5 STR BEAV (BLADE) IMPLANT
BLADE SURG 10 STRL SS (BLADE) ×2 IMPLANT
BLADE SURG 11 STRL SS (BLADE) ×4 IMPLANT
BLADE SURG 15 STRL LF DISP TIS (BLADE) ×2 IMPLANT
BLADE SURG 15 STRL SS (BLADE) ×2
CANISTER SUCT 3000ML PPV (MISCELLANEOUS) ×2 IMPLANT
CATH ROBINSON RED A/P 14FR (CATHETERS) IMPLANT
CATH VENTRICULAR 14CMX1.4MM (INSTRUMENTS) ×2 IMPLANT
CORDS BIPOLAR (ELECTRODE) ×2 IMPLANT
COVER MAYO STAND STRL (DRAPES) ×4 IMPLANT
DECANTER SPIKE VIAL GLASS SM (MISCELLANEOUS) ×2 IMPLANT
DEPRESSOR TONGUE BLADE STERILE (MISCELLANEOUS) ×2 IMPLANT
DRAIN SUBARACHNOID (WOUND CARE) IMPLANT
DRAPE C-ARM 42X72 X-RAY (DRAPES) ×2 IMPLANT
DRAPE EENT ADH APERT 15X15 STR (DRAPES) IMPLANT
DRAPE INCISE IOBAN 66X45 STRL (DRAPES) ×2 IMPLANT
DRAPE MICROSCOPE LEICA (MISCELLANEOUS) ×2 IMPLANT
DRAPE POUCH INSTRU U-SHP 10X18 (DRAPES) ×2 IMPLANT
DRAPE PROXIMA HALF (DRAPES) ×4 IMPLANT
DRESSING NASAL POPE 10X1.5X2.5 (GAUZE/BANDAGES/DRESSINGS) ×2 IMPLANT
DRSG NASAL POPE 10X1.5X2.5 (GAUZE/BANDAGES/DRESSINGS) ×4
DRSG OPSITE POSTOP 4X6 (GAUZE/BANDAGES/DRESSINGS) ×2 IMPLANT
DURAPREP 26ML APPLICATOR (WOUND CARE) ×2 IMPLANT
DURASEAL APPLICATOR TIP (TIP) ×2 IMPLANT
DURASEAL SPINE SEALANT 3ML (MISCELLANEOUS) ×2 IMPLANT
ELECT CAUTERY BLADE 6.4 (BLADE) ×2 IMPLANT
ELECT COATED BLADE 2.86 ST (ELECTRODE) ×2 IMPLANT
ELECT NEEDLE TIP 2.8 STRL (NEEDLE) ×2 IMPLANT
ELECT REM PT RETURN 9FT ADLT (ELECTROSURGICAL) ×2
ELECTRODE REM PT RTRN 9FT ADLT (ELECTROSURGICAL) ×1 IMPLANT
GAUZE PACKING FOLDED 1IN STRL (GAUZE/BANDAGES/DRESSINGS) ×2 IMPLANT
GAUZE PACKING FOLDED 2  STR (GAUZE/BANDAGES/DRESSINGS) ×1
GAUZE PACKING FOLDED 2 STR (GAUZE/BANDAGES/DRESSINGS) ×1 IMPLANT
GAUZE SPONGE 2X2 8PLY STRL LF (GAUZE/BANDAGES/DRESSINGS) ×1 IMPLANT
GAUZE SPONGE 4X4 12PLY STRL (GAUZE/BANDAGES/DRESSINGS) ×2 IMPLANT
GAUZE SPONGE 4X4 16PLY XRAY LF (GAUZE/BANDAGES/DRESSINGS) ×4 IMPLANT
GLOVE BIOGEL M 8.0 STRL (GLOVE) ×2 IMPLANT
GLOVE EXAM NITRILE LRG STRL (GLOVE) IMPLANT
GLOVE EXAM NITRILE MD LF STRL (GLOVE) IMPLANT
GLOVE EXAM NITRILE XL STR (GLOVE) IMPLANT
GLOVE EXAM NITRILE XS STR PU (GLOVE) IMPLANT
GOWN STRL REUS W/ TWL LRG LVL3 (GOWN DISPOSABLE) ×1 IMPLANT
GOWN STRL REUS W/ TWL XL LVL3 (GOWN DISPOSABLE) ×3 IMPLANT
GOWN STRL REUS W/TWL 2XL LVL3 (GOWN DISPOSABLE) IMPLANT
GOWN STRL REUS W/TWL LRG LVL3 (GOWN DISPOSABLE) ×2
GOWN STRL REUS W/TWL XL LVL3 (GOWN DISPOSABLE) ×6
HEMOSTAT POWDER KIT SURGIFOAM (HEMOSTASIS) ×2 IMPLANT
HEMOSTAT SURGICEL 2X14 (HEMOSTASIS) ×2 IMPLANT
KIT BASIN OR (CUSTOM PROCEDURE TRAY) ×2 IMPLANT
KIT ROOM TURNOVER OR (KITS) ×2 IMPLANT
MARKER SKIN DUAL TIP RULER LAB (MISCELLANEOUS) ×2 IMPLANT
NEEDLE HYPO 25X1 1.5 SAFETY (NEEDLE) ×2 IMPLANT
NEEDLE SPNL 22GX3.5 QUINCKE BK (NEEDLE) IMPLANT
NS IRRIG 1000ML POUR BTL (IV SOLUTION) ×2 IMPLANT
PAD ARMBOARD 7.5X6 YLW CONV (MISCELLANEOUS) ×2 IMPLANT
PATTIES SURGICAL .25X.25 (GAUZE/BANDAGES/DRESSINGS) IMPLANT
PATTIES SURGICAL .5 X.5 (GAUZE/BANDAGES/DRESSINGS) IMPLANT
PATTIES SURGICAL .5 X3 (DISPOSABLE) ×2 IMPLANT
PENCIL BUTTON HOLSTER BLD 10FT (ELECTRODE) ×2 IMPLANT
RUBBERBAND STERILE (MISCELLANEOUS) ×4 IMPLANT
SET COLLECT BLD 25X3/4 12 (NEEDLE) IMPLANT
SHEET SIL 040 (INSTRUMENTS) IMPLANT
SPLINT NASAL DOYLE BI-VL (GAUZE/BANDAGES/DRESSINGS) ×2 IMPLANT
SPONGE GAUZE 2X2 STER 10/PKG (GAUZE/BANDAGES/DRESSINGS) ×1
SPONGE LAP 4X18 X RAY DECT (DISPOSABLE) ×2 IMPLANT
SPONGE NEURO XRAY DETECT 1X3 (DISPOSABLE) IMPLANT
SPONGE SURGIFOAM ABS GEL SZ50 (HEMOSTASIS) IMPLANT
STAPLER SKIN PROX WIDE 3.9 (STAPLE) IMPLANT
STRIP CLOSURE SKIN 1/2X4 (GAUZE/BANDAGES/DRESSINGS) IMPLANT
STRIP CLOSURE SKIN 1/4X4 (GAUZE/BANDAGES/DRESSINGS) IMPLANT
SUT BONE WAX W31G (SUTURE) ×2 IMPLANT
SUT CHROMIC 3 0 PS 2 (SUTURE) ×4 IMPLANT
SUT CHROMIC 4 0 P 3 18 (SUTURE) IMPLANT
SUT ETHILON 3 0 PS 1 (SUTURE) IMPLANT
SUT ETHILON 4 0 PS 2 18 (SUTURE) IMPLANT
SUT ETHILON 6 0 P 1 (SUTURE) ×2 IMPLANT
SUT NOVAFIL 6 0 PRE 2 4412 13 (SUTURE) IMPLANT
SUT PLAIN 4 0 ~~LOC~~ 1 (SUTURE) IMPLANT
SUT PROLENE 6 0 BV (SUTURE) IMPLANT
SUT SILK 2 0 FS (SUTURE) ×4 IMPLANT
SUT VIC AB 2-0 CP2 18 (SUTURE) ×2 IMPLANT
SUT VIC AB 2-0 CT1 27 (SUTURE)
SUT VIC AB 2-0 CT1 27XBRD (SUTURE) IMPLANT
SUT VIC AB 3-0 SH 8-18 (SUTURE) ×2 IMPLANT
SUT VIC AB 4-0 P-3 18X BRD (SUTURE) IMPLANT
SUT VIC AB 4-0 P3 18 (SUTURE)
SYR 5ML LL (SYRINGE) ×2 IMPLANT
TAPE STRIPS DRAPE STRL (GAUZE/BANDAGES/DRESSINGS) ×2 IMPLANT
TOWEL OR 17X24 6PK STRL BLUE (TOWEL DISPOSABLE) ×2 IMPLANT
TOWEL OR 17X26 10 PK STRL BLUE (TOWEL DISPOSABLE) ×2 IMPLANT
TRAP SPECIMEN MUCOUS 40CC (MISCELLANEOUS) IMPLANT
TRAY ENT MC OR (CUSTOM PROCEDURE TRAY) ×2 IMPLANT
TRAY FOLEY W/METER SILVER 14FR (SET/KITS/TRAYS/PACK) ×2 IMPLANT
TUBE CONNECTING 12X1/4 (SUCTIONS) ×2 IMPLANT
UNDERPAD 30X30 INCONTINENT (UNDERPADS AND DIAPERS) ×2 IMPLANT
WATER STERILE IRR 1000ML POUR (IV SOLUTION) ×4 IMPLANT

## 2015-05-21 NOTE — Brief Op Note (Signed)
05/21/2015  11:46 AM  PATIENT:  Edsel Petrin  80 y.o. female  PRE-OPERATIVE DIAGNOSIS:  Pituitary abnormality  POST-OPERATIVE DIAGNOSIS:  Pituitary abnormality  PROCEDURE:  Procedure(s) with comments: Transsphenoidal resection of pituitary tumor with Dr. Radene Journey for approach (N/A) - Transsphenoidal resection of pituitary tumor with Dr. Radene Journey for approach  SURGEON:  Surgeon(s) and Role:    * Leeroy Cha, MD - Primary    * Rozetta Nunnery, MD - Assisting  PHYSICIAN ASSISTANT:   ASSISTANTS: none   ANESTHESIA:   general  EBL:  Total I/O In: 1000 [I.V.:1000] Out: 375 [Urine:225; Blood:150]  BLOOD ADMINISTERED:none  DRAINS: none   LOCAL MEDICATIONS USED:  XYLOCAINE with EPI  10 cc  SPECIMEN:  Source of Specimen:  pituitary tumor  DISPOSITION OF SPECIMEN:  PATHOLOGY  COUNTS:  YES  TOURNIQUET:  * No tourniquets in log *  DICTATION: .Other Dictation: Dictation Number [id number]  PLAN OF CARE: Admit to inpatient   PATIENT DISPOSITION:  PACU - hemodynamically stable.   Delay start of Pharmacological VTE agent (>24hrs) due to surgical blood loss or risk of bleeding: yes

## 2015-05-21 NOTE — Anesthesia Procedure Notes (Addendum)
Procedure Name: Intubation Date/Time: 05/21/2015 8:44 AM Performed by: Layla Maw Pre-anesthesia Checklist: Patient identified, Timeout performed, Emergency Drugs available, Suction available and Patient being monitored Patient Re-evaluated:Patient Re-evaluated prior to inductionOxygen Delivery Method: Circle system utilized Preoxygenation: Pre-oxygenation with 100% oxygen Intubation Type: IV induction Ventilation: Mask ventilation without difficulty Laryngoscope Size: Miller and 3 Grade View: Grade II Tube type: Oral Rae Tube size: 7.5 mm Number of attempts: 1 Placement Confirmation: ETT inserted through vocal cords under direct vision,  positive ETCO2 and breath sounds checked- equal and bilateral Secured at: 21 cm Tube secured with: Tape Dental Injury: Teeth and Oropharynx as per pre-operative assessment

## 2015-05-21 NOTE — Transfer of Care (Signed)
Immediate Anesthesia Transfer of Care Note  Patient: CHARNETTA WULFF  Procedure(s) Performed: Procedure(s) with comments: Transsphenoidal resection of pituitary tumor with Dr. Radene Journey for approach (N/A) - Transsphenoidal resection of pituitary tumor with Dr. Radene Journey for approach  Patient Location: PACU  Anesthesia Type:General  Level of Consciousness: awake, alert , oriented and patient cooperative  Airway & Oxygen Therapy: Patient Spontanous Breathing and Patient connected to face mask oxygen  Post-op Assessment: Report given to RN, Post -op Vital signs reviewed and stable and Patient moving all extremities X 4  Post vital signs: Reviewed and stable  Last Vitals:  Filed Vitals:   05/21/15 0659  BP: 138/64  Pulse: 84  Temp: 36.2 C  Resp: 20    Complications: No apparent anesthesia complications

## 2015-05-21 NOTE — Care Management (Signed)
Initial UR completed. Brylon Brenning RN BSN  

## 2015-05-21 NOTE — Anesthesia Postprocedure Evaluation (Signed)
  Anesthesia Post-op Note  Patient: Adriana Jordan  Procedure(s) Performed: Procedure(s) with comments: Transsphenoidal resection of pituitary tumor with Dr. Radene Journey for approach (N/A) - Transsphenoidal resection of pituitary tumor with Dr. Radene Journey for approach  Patient Location: PACU  Anesthesia Type:General  Level of Consciousness: awake and alert   Airway and Oxygen Therapy: Patient Spontanous Breathing  Post-op Pain: Controlled  Post-op Assessment: Post-op Vital signs reviewed, Patient's Cardiovascular Status Stable and Respiratory Function Stable  Post-op Vital Signs: Reviewed  Filed Vitals:   05/21/15 1244  BP: 176/87  Pulse: 88  Temp:   Resp: 12    Complications: No apparent anesthesia complications

## 2015-05-22 ENCOUNTER — Encounter (HOSPITAL_COMMUNITY): Payer: Self-pay | Admitting: Neurosurgery

## 2015-05-22 LAB — CBC WITH DIFFERENTIAL/PLATELET
Basophils Absolute: 0 10*3/uL (ref 0.0–0.1)
Basophils Relative: 0 %
EOS ABS: 0 10*3/uL (ref 0.0–0.7)
Eosinophils Relative: 0 %
HCT: 33.4 % — ABNORMAL LOW (ref 36.0–46.0)
Hemoglobin: 11.3 g/dL — ABNORMAL LOW (ref 12.0–15.0)
LYMPHS ABS: 0.5 10*3/uL — AB (ref 0.7–4.0)
Lymphocytes Relative: 8 %
MCH: 30.2 pg (ref 26.0–34.0)
MCHC: 33.8 g/dL (ref 30.0–36.0)
MCV: 89.3 fL (ref 78.0–100.0)
MONO ABS: 0.2 10*3/uL (ref 0.1–1.0)
Monocytes Relative: 2 %
Neutro Abs: 5.5 10*3/uL (ref 1.7–7.7)
Neutrophils Relative %: 90 %
PLATELETS: 344 10*3/uL (ref 150–400)
RBC: 3.74 MIL/uL — AB (ref 3.87–5.11)
RDW: 12.4 % (ref 11.5–15.5)
WBC: 6.1 10*3/uL (ref 4.0–10.5)

## 2015-05-22 LAB — BASIC METABOLIC PANEL
Anion gap: 9 (ref 5–15)
BUN: 10 mg/dL (ref 6–20)
CHLORIDE: 99 mmol/L — AB (ref 101–111)
CO2: 22 mmol/L (ref 22–32)
CREATININE: 0.91 mg/dL (ref 0.44–1.00)
Calcium: 8.7 mg/dL — ABNORMAL LOW (ref 8.9–10.3)
GFR calc Af Amer: >60 mL/min (ref >60)
GFR calc non Af Amer: 59 mL/min — ABNORMAL LOW (ref >60)
GLUCOSE: 180 mg/dL — AB (ref 65–99)
Potassium: 3.8 mmol/L (ref 3.5–5.1)
SODIUM: 130 mmol/L — AB (ref 135–145)

## 2015-05-22 MED ORDER — PANTOPRAZOLE SODIUM 40 MG PO TBEC
40.0000 mg | DELAYED_RELEASE_TABLET | Freq: Every day | ORAL | Status: DC
  Administered 2015-05-22 – 2015-05-23 (×2): 40 mg via ORAL
  Filled 2015-05-22 (×2): qty 1

## 2015-05-22 MED ORDER — CETYLPYRIDINIUM CHLORIDE 0.05 % MT LIQD
7.0000 mL | Freq: Two times a day (BID) | OROMUCOSAL | Status: DC
  Administered 2015-05-22 – 2015-05-23 (×2): 7 mL via OROMUCOSAL

## 2015-05-22 NOTE — Progress Notes (Signed)
POD 1 Awake alert VSS Packing intact with minimal bleeding or drainage Stable post op course Will plan on removing nasal packing tomorrow am

## 2015-05-22 NOTE — Progress Notes (Signed)
O2 set up delivery device changed. Pt is stable at this time. RN aware

## 2015-05-22 NOTE — Op Note (Signed)
Jordan, Adriana             ACCOUNT NO.:  [id number]  MEDICAL RECORD NO.:  [id number]  LOCATION:  3M14C                        FACILITY:  [facility]  PHYSICIAN:  Leeroy Cha, M.D.   DATE OF BIRTH:  [birth year] Adriana Severin Cram,MD  DATE OF PROCEDURE:  05/21/2015 DATE OF DISCHARGE:                              OPERATIVE REPORT   PREOPERATIVE DIAGNOSIS:  Pituitary tumor.  POSTOPERATIVE DIAGNOSIS:  Pituitary tumor.  PROCEDURE:  Trans-sphenoidal total gross resection of the pituitary tumor.  Bone graft from the abdominal wall.  Microscope.  SURGEON:  Leeroy Cha, MD  CO-SURGEONLeonides Sake. Lucia Gaskins, MD, from ENT Service.  CLINICAL HISTORY:  Ms. Devivo is a lady who was seen by me in my office because of headache.  Findings in the MRI which showed that she has most likely a large pituitary benign tumor.  The patient has completed workup including and seen by the endocrinologist and the ophthalmologist.  We talked about surgery.  We talked about conservative treatment which means follow her up with MRIs.  At the end, she decided to proceed with surgery.  The patient knew the risks and benefits of the surgery.  I knew Ms. Bellot for many years, since I have known from her cervical and lumbar surgery.  DESCRIPTION OF PROCEDURE:  The patient was taken to the OR, and after intubation, the face was cleaned with the Betadine and the abdominal wall in the right side was cleaned with DuraPrep.  Drapes were applied. Dr. Lucia Gaskins from the ENT Service proceeded with the opening and approached the sphenoid sinus.  By the time I got involved, I had good visualization of the floor of the sinus.  Removal of the floor of the _________ sella was done using the microcurette and __________ small 1 and 2 mm Kerrison punch.  We opened the capsule of the tumor with an 11 blade knife and immediately necrotic tumor came into view.  Using the curettes in different shapes to right and to the left and  __________ above and below, we did a total gross resection.  ___then_______ mostly 80% of the tumor which was removed was necrotic and some had some solid texture. This patient was sent for a permanent section.  The procedure was to monitor using x-ray, yet to be sure that we were able to dissect in the midline and laterally.  We were quite careful about going laterally because of proximity to the cavernous sinus of the carotid artery. Valsalva maneuver up to __________ twice was negative.  There was no evidence of any CSF leak.  The area was irrigated.  Surgifoam was left to help [place] with hemostasis.  Then, an incision was made in the abdominal wall of the right side to the skin, subcutaneous tissue, and small piece of __________ was removed.  The area of the wound was irrigated and closed with Vicryl and Steri-Strips.  We went back again to the head and we were able to introduce small piece of fat to prevent any _herniation_________ of the optic nerves.  The floor was made using bone from the nasal cavity.  The area was irrigated and from then on Dr. Lucia Gaskins took over to close the area.  The patient woke up, awake, following commands.  She is going to be at least overnight in the intensive care unit.          ______________________________ Leeroy Cha, M.D.     EB/MEDQ  D:  05/21/2015  T:  05/22/2015  Job:  [id number]

## 2015-05-22 NOTE — Op Note (Signed)
NAMEMEIA, EMLEY             ACCOUNT NO.:  [id number]  MEDICAL RECORD NO.:  [id number]  LOCATION:  3M14C                        FACILITY:  [facility]  PHYSICIAN:  Leonides Sake. Lucia Gaskins, M.D.DATE OF BIRTH:  Apr 06, 1935  DATE OF PROCEDURE:  05/21/2015 DATE OF DISCHARGE:                              OPERATIVE REPORT   PREOPERATIVE DIAGNOSIS:  Pituitary tumor.  POSTOPERATIVE DIAGNOSIS:  Pituitary tumor.  OPERATION PERFORMED:  Transseptal transsphenoidal resection of pituitary tumor.  SURGEON:  Leonides Sake. Lucia Gaskins, M.D.  Lolly MustacheLeeroy Cha, M.D.  ANESTHESIA:  General endotracheal.  COMPLICATIONS:  None.  BRIEF CLINICAL NOTE:  Adriana Jordan is a 80 year old female who has had history of headaches, nausea and vomiting and had a CT scan and MRI scan that demonstrated a pituitary tumor.  She has had no real visual changes, but has a generous-sized tumor and elected to undergo resection of pituitary tumor via transsphenoidal approach.  DESCRIPTION OF PROCEDURE:  The patient was brought to the operating room and placed in the supine position, C-arm was positioned.  Nose was then prepped with Betadine solution and draped out as a sterile towels.  The nose was injected with 8-10 mL of Xylocaine with epinephrine for hemostasis.  Septum was relatively midline.  An extended hemitransfixion incision was made along the caudal edge of the septum on the right side extending down to the floor of the nose.  Mucoperichondrial and mucoperiosteal flaps were elevated off the right side first.  A vertical incision was made through the cartilaginous septum about 2 cm posteriorly just anterior to the bony portion of the septum.  The anterior cartilaginous septum was then dissected off the maxillary crest and diverted to the left side after elevating the mucoperiosteal tissue off the floor of the nose on the left side.  Further dissection was carried down on either side of the bony septum  posteriorly to the space of the sphenoid.  Next, the [facility] retractors were positioned to expose the space of the sphenoid.  Using the 4-mm osteotome, a sphenoidotomy was created and enlarged with Kerrison forceps superiorly, inferiorly and laterally.  Of note, the septum of the sphenoid sinus bowed to the right side and encroached on the sella on the right side of the sella. The left sphenoid cavity was very large, right sphenoid cavity was very small and had thickened mucosa within the small right sphenoid sinus. Most of the mucosa was removed.  At this point, the limits of our exposure were confirmed with the C-arm.  Dr. Joya Salm subsequently scrubbed in and performed resection of pituitary tumor.  Following completion of the pituitary tumor, I was called back in for closure. The sphenoid sinus had been packed with previously harvested fat by Dr. Joya Salm.  The septum was brought back onto the maxillary crest and brought back to midline.  Mucosal perichondrium and mucoperiosteal flaps were brought back to the midline.  A hemitransfixion incision was closed with interrupted 4-0 chromic sutures.  The septum was basted with a 3-0 chromic suture.  Splints were secured to either side of the septum with a 3-0 nylon suture.  Then, the nasal cavities were packed with large Merocel packs placed on either side of the  nose and hydrated with saline.  Oropharynx was suctioned of any residual blood, which was minimal.  The patient was subsequently woken and transferred to recovery room, postop doing well.  Will be planned subsequent admission to the Neuro ICU.  We will plan on removing the nasal packs in 2-3 days.          ______________________________ Leonides Sake Lucia Gaskins, M.D.     CEN/MEDQ  D:  05/21/2015  T:  05/22/2015  Job:  [id number]  cc:   Leeroy Cha, M.D.

## 2015-05-22 NOTE — Progress Notes (Signed)
Patient ID: Adriana Jordan, female   DOB: 1935-03-09, 80 y.o.   MRN: [id number] Stable, c/o facial pressure. Output good. No weakness. See orders

## 2015-05-23 ENCOUNTER — Encounter (HOSPITAL_COMMUNITY): Payer: Self-pay

## 2015-05-23 NOTE — Progress Notes (Signed)
Patient ID: Adriana Jordan, female   DOB: Dec 16, 1934, 80 y.o.   MRN: [id number] Stable, nasal packings out. Less facial swelling. cranail nerves intact  To floor

## 2015-05-23 NOTE — Progress Notes (Signed)
POD 2 Awake alert  VSS Nasal packing removed with minimal bleeding Will have patient follow up in my office next week (Thurs) to have the septal splints removed. Stable post op course

## 2015-05-23 NOTE — Discharge Instructions (Signed)
Use saline nasal spray if you have much nasal congestion. OK to blow nose gently Call Dr Pollie Friar office for follow up appt for next Thursday   [phone]

## 2015-05-23 NOTE — Plan of Care (Signed)
Problem: Consults Goal: Diagnosis - Craniotomy Outcome: Completed/Met Date Met:  05/23/15 Tumor

## 2015-05-24 MED ORDER — HYDROCODONE-ACETAMINOPHEN 5-325 MG PO TABS: 1.0000 | ORAL_TABLET | ORAL | Status: DC | PRN

## 2015-05-24 MED ORDER — DEXAMETHASONE 2 MG PO TABS: 2.0000 mg | ORAL_TABLET | Freq: Two times a day (BID) | ORAL | Status: DC

## 2015-05-24 NOTE — Progress Notes (Signed)
Subjective: Patient reports doing well  Objective: Vital signs in last 24 hours: Temp:  [97.5 F (36.4 C)-98.9 F (37.2 C)] 97.5 F (36.4 C) (10/15 0457) Pulse Rate:  [63-94] 74 (10/15 0457) Resp:  [13-20] 18 (10/15 0457) BP: (137-155)/(66-82) 152/78 mmHg (10/15 0457) SpO2:  [92 %-97 %] 96 % (10/15 0457) FiO2 (%):  [25 %-28 %] 28 % (10/14 0910)  Intake/Output from previous day: 10/14 0701 - 10/15 0700 In: 725 [P.O.:350; I.V.:375] Out: 830 [Urine:830] Intake/Output this shift:    Physical Exam: VFF.  Dressings CDI.  Lab Results:  Recent Labs  05/22/15 0445  WBC 6.1  HGB 11.3*  HCT 33.4*  PLT 344   BMET  Recent Labs  05/22/15 0445  NA 130*  K 3.8  CL 99*  CO2 22  GLUCOSE 180*  BUN 10  CREATININE 0.91  CALCIUM 8.7*    Studies/Results: No results found.  Assessment/Plan: Patient doing well.  Discharge home.  No evidence of DI.    LOS: 3 days    Kijana Estock D, MD 05/24/2015, 7:00 AM

## 2015-05-24 NOTE — Discharge Summary (Signed)
Physician Discharge Summary  Patient ID: Adriana Jordan MRN: [id number] DOB/AGE: 10-Feb-1935 80 y.o.  Admit date: 05/21/2015 Discharge date: 05/24/2015  Admission Diagnoses:Pituitary adenoma  Discharge Diagnoses: Same Active Problems:   Adenoma   Pituitary tumor [facility])   Discharged Condition: good  Hospital Course: Uncomplicated transsphenoidal resection of pituitary adenoma  Consults: None  Significant Diagnostic Studies: None  Treatments: surgery: transsphenoidal resection of pituitary adenoma  Discharge Exam: Blood pressure 152/78, pulse 74, temperature 97.5 F (36.4 C), temperature source Oral, resp. rate 18, height 5\' 6"  (1.676 m), weight 71.1 kg (156 lb 12 oz), SpO2 96 %. Neurologic: Alert and oriented X 3, normal strength and tone. Normal symmetric reflexes. Normal coordination and gait Wound:  CDI  Disposition: Home     Medication List    TAKE these medications        aspirin 81 MG tablet  Take 81 mg by mouth daily.     dexamethasone 2 MG tablet  Commonly known as:  DECADRON  Take 1 tablet (2 mg total) by mouth 2 (two) times daily with a meal.     HYDROcodone-acetaminophen 5-325 MG tablet  Commonly known as:  NORCO/VICODIN  Take 1 tablet by mouth every 8 (eight) hours as needed for moderate pain.     HYDROcodone-acetaminophen 5-325 MG tablet  Commonly known as:  NORCO/VICODIN  Take 1-2 tablets by mouth every 4 (four) hours as needed for moderate pain.     ibuprofen 200 MG tablet  Commonly known as:  ADVIL,MOTRIN  Take 200 mg by mouth every 6 (six) hours as needed for moderate pain.     losartan-hydrochlorothiazide 50-12.5 MG tablet  Commonly known as:  HYZAAR  Take 1 tablet by mouth daily.     omeprazole 20 MG capsule  Commonly known as:  PRILOSEC  Take 20 mg by mouth daily.     promethazine 25 MG tablet  Commonly known as:  PHENERGAN  Take 25 mg by mouth every 6 (six) hours as needed for nausea or vomiting.     simvastatin 40 MG tablet   Commonly known as:  ZOCOR  Take 40 mg by mouth daily.           Follow-up Information    Follow up with Melony Overly, MD. Call in 6 days.   Specialty:  Otolaryngology   Why:  Call office for follow up appt for next Thursday to have septal splints removed and nasal check   Contact information:   [place] [address] [phone]       Signed: Peggyann Shoals, MD 05/24/2015, 7:07 AM

## 2015-05-24 NOTE — Progress Notes (Signed)
Patient and her son given DC instructions, prescriptions and handouts. All questions answered. Patient in [facility] to be escorted to lobby by volunteer. Patient to be transported home by son in private vehicle.

## 2015-06-13 DIAGNOSIS — M25512 Pain in left shoulder: Secondary | ICD-10-CM | POA: Diagnosis not present

## 2015-06-13 DIAGNOSIS — M7061 Trochanteric bursitis, right hip: Secondary | ICD-10-CM | POA: Diagnosis not present

## 2015-06-13 DIAGNOSIS — M25511 Pain in right shoulder: Secondary | ICD-10-CM | POA: Diagnosis not present

## 2015-06-19 DIAGNOSIS — R51 Headache: Secondary | ICD-10-CM | POA: Diagnosis not present

## 2015-06-19 DIAGNOSIS — I10 Essential (primary) hypertension: Secondary | ICD-10-CM | POA: Diagnosis not present

## 2015-06-19 DIAGNOSIS — M545 Low back pain: Secondary | ICD-10-CM | POA: Diagnosis not present

## 2015-06-19 DIAGNOSIS — E784 Other hyperlipidemia: Secondary | ICD-10-CM | POA: Diagnosis not present

## 2015-06-30 DIAGNOSIS — M25511 Pain in right shoulder: Secondary | ICD-10-CM | POA: Diagnosis not present

## 2015-07-01 DIAGNOSIS — M25511 Pain in right shoulder: Secondary | ICD-10-CM | POA: Diagnosis not present

## 2015-07-08 DIAGNOSIS — Z1231 Encounter for screening mammogram for malignant neoplasm of breast: Secondary | ICD-10-CM | POA: Diagnosis not present

## 2015-07-29 DIAGNOSIS — I425 Other restrictive cardiomyopathy: Secondary | ICD-10-CM | POA: Diagnosis not present

## 2015-07-29 DIAGNOSIS — I351 Nonrheumatic aortic (valve) insufficiency: Secondary | ICD-10-CM | POA: Diagnosis not present

## 2015-07-29 DIAGNOSIS — I340 Nonrheumatic mitral (valve) insufficiency: Secondary | ICD-10-CM | POA: Diagnosis not present

## 2015-07-29 DIAGNOSIS — I361 Nonrheumatic tricuspid (valve) insufficiency: Secondary | ICD-10-CM | POA: Diagnosis not present

## 2016-01-28 ENCOUNTER — Ambulatory Visit (INDEPENDENT_AMBULATORY_CARE_PROVIDER_SITE_OTHER): Payer: Medicare Other | Admitting: Sports Medicine

## 2016-01-28 ENCOUNTER — Encounter: Payer: Self-pay | Admitting: Sports Medicine

## 2016-01-28 ENCOUNTER — Ambulatory Visit (INDEPENDENT_AMBULATORY_CARE_PROVIDER_SITE_OTHER): Payer: Medicare Other

## 2016-01-28 DIAGNOSIS — M6588 Other synovitis and tenosynovitis, other site: Secondary | ICD-10-CM | POA: Diagnosis not present

## 2016-01-28 DIAGNOSIS — Z87828 Personal history of other (healed) physical injury and trauma: Secondary | ICD-10-CM

## 2016-01-28 DIAGNOSIS — M779 Enthesopathy, unspecified: Secondary | ICD-10-CM

## 2016-01-28 DIAGNOSIS — M79671 Pain in right foot: Secondary | ICD-10-CM

## 2016-01-28 DIAGNOSIS — M19071 Primary osteoarthritis, right ankle and foot: Secondary | ICD-10-CM

## 2016-01-28 DIAGNOSIS — M7751 Other enthesopathy of right foot: Secondary | ICD-10-CM

## 2016-01-28 DIAGNOSIS — M7750 Other enthesopathy of unspecified foot: Secondary | ICD-10-CM

## 2016-01-28 DIAGNOSIS — M778 Other enthesopathies, not elsewhere classified: Secondary | ICD-10-CM

## 2016-01-28 MED ORDER — TRIAMCINOLONE ACETONIDE 10 MG/ML IJ SUSP: 10.0000 mg | Freq: Once | INTRAMUSCULAR | Status: DC

## 2016-01-28 MED ORDER — DICLOFENAC SODIUM 75 MG PO TBEC: 75.0000 mg | DELAYED_RELEASE_TABLET | Freq: Two times a day (BID) | ORAL | Status: DC

## 2016-01-28 NOTE — Progress Notes (Signed)
Patient ID: KES WESTMORELAND, female   DOB: [birth year], 81 y.o.   MRN: [id number] Subjective: Adriana Jordan is a 81 y.o. female patient who presents to office for evaluation of right foot pain. Patient complains of progressive pain especially over the last 2 years at the top of right foot after she dropped a gallon of water, on her foot. Reports that she was told at that time that she had several fractures also had MRI that demonstrated this and was put in a surgical shoe. States that she feels like the fracture sites, May have not healed correctly because she still has pain every day that comes and goes; reports that she even has pain at night, hurts worse with activity or with walking has also tried changing shoes and arch supports with no resolution in symptoms. Patient denies any other pedal complaints.   Patient Active Problem List   Diagnosis Date Noted  . Adenoma 05/21/2015  . Pituitary tumor ([place]) 05/21/2015  . Pituitary adenoma ([place]) 04/07/2015    Current Outpatient Prescriptions on File Prior to Visit  Medication Sig Dispense Refill  . aspirin 81 MG tablet Take 81 mg by mouth daily.    Marland Kitchen losartan-hydrochlorothiazide (HYZAAR) 50-12.5 MG per tablet Take 1 tablet by mouth daily.    Marland Kitchen omeprazole (PRILOSEC) 20 MG capsule Take 20 mg by mouth daily.    . promethazine (PHENERGAN) 25 MG tablet Take 25 mg by mouth every 6 (six) hours as needed for nausea or vomiting.    . simvastatin (ZOCOR) 40 MG tablet Take 40 mg by mouth daily.     No current facility-administered medications on file prior to visit.    No Known Allergies  Objective:  General: Alert and oriented x3 in no acute distress  Dermatology: No open lesions bilateral lower extremities, no webspace macerations, no ecchymosis bilateral, all nails x 10 are well manicured.  Vascular: Dorsalis Pedis and Posterior Tibial pedal pulses palpable, Capillary Fill Time 3 seconds,(+) pedal hair growth bilateral, no edema bilateral lower  extremities, Temperature gradient within normal limits.  Neurology: Johney Maine sensation intact via light touch bilateral, Protective sensation intact  with Thornell Mule Monofilament to all pedal sites, Position sense intact, vibratory intact bilateral, Deep tendon reflexes within normal limits bilateral, No babinski sign present bilateral. (- )Tinels sign bilateral.   Musculoskeletal: Mild tenderness with palpation at dorsal lateral aspect of the right foot, diffuse midfoot bony irregularities, consistent with exostosis secondary to likely arthritis, right foot, No pain with calf compression bilateral. There is significant bunion formation and limitation of motion at midtarsal joint, right greater than left. All other joints are within normal limits. Strength within normal limits in all groups bilateral.   Xrays  Right Foot   Impression: Normal osseous mineralization. There is inferior and posterior calcaneal spur narrowing of the midtarsal joint with dorsal spurring, first metatarsophalangeal joint space narrowing and osteophytes consistent with arthritis. There is significant bunion and hammertoe. No frank or acute fracture or dislocation. No foreign body, soft tissues within normal limits.   Assessment and Plan: Problem List Items Addressed This Visit    None    Visit Diagnoses    Right foot pain    -  Primary    Relevant Medications    triamcinolone acetonide (KENALOG) 10 MG/ML injection 10 mg    diclofenac (VOLTAREN) 75 MG EC tablet    Other Relevant Orders    DG Foot 2 Views Right    Capsulitis of foot, right  Relevant Medications    triamcinolone acetonide (KENALOG) 10 MG/ML injection 10 mg    diclofenac (VOLTAREN) 75 MG EC tablet    Tendonitis of foot        Relevant Medications    triamcinolone acetonide (KENALOG) 10 MG/ML injection 10 mg    diclofenac (VOLTAREN) 75 MG EC tablet    Osteoarthritis of ankle and foot, right        Relevant Medications    triamcinolone  acetonide (KENALOG) 10 MG/ML injection 10 mg    diclofenac (VOLTAREN) 75 MG EC tablet    History of trauma        Relevant Medications    triamcinolone acetonide (KENALOG) 10 MG/ML injection 10 mg    diclofenac (VOLTAREN) 75 MG EC tablet        -Complete examination performed -Xrays reviewed -Discussed treatement options For likely capsulitis tendinitis with arthritis secondary to old history of trauma After oral consent and aseptic prep, injected a mixture containing 1 ml of 2%  plain lidocaine, 1 ml 0.5% plain marcaine, 0.5 ml of kenalog 10 and 0.5 ml of dexamethasone phosphate into right dorsal lateral foot at point of maximal tenderness without complication. Post-injection care discussed with patient.  -Rx diclofenac to take as instructed -Recommend Epsom soaks, ice, and elevation daily -Patient to return to office in 5 weeks or sooner if condition worsens. Advised patient to bring her tennis shoes and orthotics for me to evaluate next visit.  Landis Martins, DPM

## 2016-03-03 ENCOUNTER — Encounter: Payer: Self-pay | Admitting: Sports Medicine

## 2016-03-03 ENCOUNTER — Ambulatory Visit (INDEPENDENT_AMBULATORY_CARE_PROVIDER_SITE_OTHER): Payer: Medicare Other | Admitting: Sports Medicine

## 2016-03-03 DIAGNOSIS — M778 Other enthesopathies, not elsewhere classified: Secondary | ICD-10-CM

## 2016-03-03 DIAGNOSIS — M79671 Pain in right foot: Secondary | ICD-10-CM

## 2016-03-03 DIAGNOSIS — Z87828 Personal history of other (healed) physical injury and trauma: Secondary | ICD-10-CM

## 2016-03-03 DIAGNOSIS — M7750 Other enthesopathy of unspecified foot: Secondary | ICD-10-CM

## 2016-03-03 DIAGNOSIS — M7751 Other enthesopathy of right foot: Secondary | ICD-10-CM

## 2016-03-03 DIAGNOSIS — M6588 Other synovitis and tenosynovitis, other site: Secondary | ICD-10-CM | POA: Diagnosis not present

## 2016-03-03 DIAGNOSIS — M19071 Primary osteoarthritis, right ankle and foot: Secondary | ICD-10-CM

## 2016-03-03 DIAGNOSIS — M779 Enthesopathy, unspecified: Secondary | ICD-10-CM

## 2016-03-03 NOTE — Progress Notes (Signed)
Patient ID: Adriana Jordan, female   DOB: 20-Mar-1935, 81 y.o.   MRN: [id number] Subjective: Adriana Jordan is a 81 y.o. female patient who returns to office for evaluation of right foot pain. Patient states that her foot still hurts. No relief with ice, injection, medication, or Epsom salt soaks that was recommended at last visit. Patient reports that nothing has helped. Patient denies any other pedal complaints.   Patient Active Problem List   Diagnosis Date Noted  . Adenoma 05/21/2015  . Pituitary tumor ([place]) 05/21/2015  . Pituitary adenoma ([place]) 04/07/2015    Current Outpatient Prescriptions on File Prior to Visit  Medication Sig Dispense Refill  . aspirin 81 MG tablet Take 81 mg by mouth daily.    . diclofenac (VOLTAREN) 75 MG EC tablet Take 1 tablet (75 mg total) by mouth 2 (two) times daily. 30 tablet 1  . losartan-hydrochlorothiazide (HYZAAR) 50-12.5 MG per tablet Take 1 tablet by mouth daily.    Marland Kitchen omeprazole (PRILOSEC) 20 MG capsule Take 20 mg by mouth daily.    . promethazine (PHENERGAN) 25 MG tablet Take 25 mg by mouth every 6 (six) hours as needed for nausea or vomiting.    . simvastatin (ZOCOR) 40 MG tablet Take 40 mg by mouth daily.     Current Facility-Administered Medications on File Prior to Visit  Medication Dose Route Frequency Provider Last Rate Last Dose  . triamcinolone acetonide (KENALOG) 10 MG/ML injection 10 mg  10 mg Other Once [employer], DPM        No Known Allergies  Objective:  General: Alert and oriented x3 in no acute distress  Dermatology: No open lesions bilateral lower extremities, no webspace macerations, no ecchymosis bilateral, all nails x 10 are well manicured.  Vascular: Dorsalis Pedis and Posterior Tibial pedal pulses palpable, Capillary Fill Time 3 seconds,(+) pedal hair growth bilateral, no edema bilateral lower extremities, Temperature gradient within normal limits.  Neurology: Johney Maine sensation intact via light touch bilateral,  Protective sensation intact  with Thornell Mule Monofilament to all pedal sites, Position sense intact, vibratory intact bilateral, Deep tendon reflexes within normal limits bilateral, No babinski sign present bilateral. (- )Tinels sign bilateral.   Musculoskeletal: Mild tenderness with palpation at dorsal lateral aspect of the right foot, diffuse midfoot bony irregularities, consistent with exostosis secondary to likely arthritis vs old trauma, right foot, No pain with calf compression bilateral. There is significant bunion formation and limitation of motion at midtarsal joint, right greater than left. All other joints are within normal limits. Strength within normal limits in all groups bilateral.   Assessment and Plan: Problem List Items Addressed This Visit    None    Visit Diagnoses    Right foot pain    -  Primary   Capsulitis of foot, right       Tendonitis of foot       Osteoarthritis of ankle and foot, right       History of trauma       with previous fracture       -Complete examination performed -Discussed treatement options For likely capsulitis tendinitis with arthritis secondary to old history of trauma -Recommend repeat MRI; Patient states that she would like to think about this. I explained to patient to call the office when she has made a decision about this. MRI may be beneficial in r/o arthritis vs old non healed fracture that could possibly offer additional treatment options -Recommend continue with Epsom soaks, ice, and elevation  daily until symptoms improve -Patient to return to office as needed or sooner if condition worsens.   Landis Martins, DPM

## 2016-07-13 ENCOUNTER — Encounter: Payer: Self-pay | Admitting: Obstetrics & Gynecology

## 2017-04-07 ENCOUNTER — Encounter: Payer: Medicare Other | Admitting: Obstetrics & Gynecology

## 2017-04-26 ENCOUNTER — Ambulatory Visit (INDEPENDENT_AMBULATORY_CARE_PROVIDER_SITE_OTHER): Payer: Medicare Other | Admitting: Obstetrics & Gynecology

## 2017-04-26 ENCOUNTER — Encounter: Payer: Self-pay | Admitting: Obstetrics & Gynecology

## 2017-04-26 VITALS — BP 140/76 | Ht 63.0 in | Wt 159.0 lb

## 2017-04-26 DIAGNOSIS — Z780 Asymptomatic menopausal state: Secondary | ICD-10-CM

## 2017-04-26 DIAGNOSIS — Z01419 Encounter for gynecological examination (general) (routine) without abnormal findings: Secondary | ICD-10-CM | POA: Diagnosis not present

## 2017-04-26 NOTE — Progress Notes (Signed)
Adriana Jordan Aug 23, 1934 [id number]   History:    82 y.o. G1P1L1 Abstinent  RP:  Established patient presenting for annual gyn exam   HPI:  Menopause.  No HRT.  No PMB.  No pelvic pain.  Followed by Cardio/Labs.  H/O Benign Brain tumor resected 2 years ago, will repeat MRI soon.  Breasts wnl.  Mictions/BMs normal.  Walks 1 1/2 mile every day.  Good nutrition.  Drinks milk.  Vit D supplements.  Past medical history,surgical history, family history and social history were all reviewed and documented in the EPIC chart.  Gynecologic History No LMP recorded (lmp unknown). Patient is postmenopausal. Contraception: post menopausal status Last Pap: 2016. Results were: normal Last mammogram: 07/2016. Results were: Negative Declines Bone Density [place] 2016  Obstetric History OB History  Gravida Para Term Preterm AB Living  1 1       1   SAB TAB Ectopic Multiple Live Births               # Outcome Date GA Lbr Len/2nd Weight Sex Delivery Anes PTL Lv  1 Para                ROS: A ROS was performed and pertinent positives and negatives are included in the history.  GENERAL: No fevers or chills. HEENT: No change in vision, no earache, sore throat or sinus congestion. NECK: No pain or stiffness. CARDIOVASCULAR: No chest pain or pressure. No palpitations. PULMONARY: No shortness of breath, cough or wheeze. GASTROINTESTINAL: No abdominal pain, nausea, vomiting or diarrhea, melena or bright red blood per rectum. GENITOURINARY: No urinary frequency, urgency, hesitancy or dysuria. MUSCULOSKELETAL: No joint or muscle pain, no back pain, no recent trauma. DERMATOLOGIC: No rash, no itching, no lesions. ENDOCRINE: No polyuria, polydipsia, no heat or cold intolerance. No recent change in weight. HEMATOLOGICAL: No anemia or easy bruising or bleeding. NEUROLOGIC: No headache, seizures, numbness, tingling or weakness. PSYCHIATRIC: No depression, no loss of interest in normal activity or change in sleep  pattern.     Exam:   BP 140/76   Ht 5\' 3"  (1.6 m)   Wt 159 lb (72.1 kg)   LMP  (LMP Unknown)   BMI 28.17 kg/m   Body mass index is 28.17 kg/m.  General appearance : Well developed well nourished female. No acute distress HEENT: Eyes: no retinal hemorrhage or exudates,  Neck supple, trachea midline, no carotid bruits, no thyroidmegaly Lungs: Clear to auscultation, no rhonchi or wheezes, or rib retractions  Heart: Regular rate and rhythm, no murmurs or gallops Breast:Examined in sitting and supine position were symmetrical in appearance, no palpable masses or tenderness,  no skin retraction, no nipple inversion, no nipple discharge, no skin discoloration, no axillary or supraclavicular lymphadenopathy Abdomen: no palpable masses or tenderness, no rebound or guarding Extremities: no edema or skin discoloration or tenderness  Pelvic: Vulva normal  Bartholin, Urethra, Skene Glands: Within normal limits             Vagina: No gross lesions or discharge  Cervix: No gross lesions or discharge  Uterus  AV, normal size, shape and consistency, non-tender and mobile  Adnexa  Without masses or tenderness  Anus and perineum  normal    Assessment/Plan:  82 y.o. female for annual exam   1. Well female exam with routine gynecological exam Gyn exam normal.  Normal pap 2016.  Breasts wnl.  Mammo neg 07/2016.  2. Menopause present No HRT.  No PMB.  Declines Bone  Density.  Exercises regularly, Vit D supplements and milk products in nutrition.  Princess Bruins MD, 2:59 PM 04/26/2017

## 2017-04-26 NOTE — Patient Instructions (Signed)
1. Well female exam with routine gynecological exam Gyn exam normal.  Normal pap 2016.  Breasts wnl.  Mammo neg 07/2016.  2. Menopause present No HRT.  No PMB.  Declines Bone Density.  Exercises regularly, Vit D supplements and milk products in nutrition.  Adriana Jordan, it was a pleasure to see you today!  Keep it up with your fitness!   Health Maintenance for Postmenopausal Women Menopause is a normal process in which your reproductive ability comes to an end. This process happens gradually over a span of months to years, usually between the ages of 74 and 59. Menopause is complete when you have missed 12 consecutive menstrual periods. It is important to talk with your health care provider about some of the most common conditions that affect postmenopausal women, such as heart disease, cancer, and bone loss (osteoporosis). Adopting a healthy lifestyle and getting preventive care can help to promote your health and wellness. Those actions can also lower your chances of developing some of these common conditions. What should I know about menopause? During menopause, you may experience a number of symptoms, such as:  Moderate-to-severe hot flashes.  Night sweats.  Decrease in sex drive.  Mood swings.  Headaches.  Tiredness.  Irritability.  Memory problems.  Insomnia.  Choosing to treat or not to treat menopausal changes is an individual decision that you make with your health care provider. What should I know about hormone replacement therapy and supplements? Hormone therapy products are effective for treating symptoms that are associated with menopause, such as hot flashes and night sweats. Hormone replacement carries certain risks, especially as you become older. If you are thinking about using estrogen or estrogen with progestin treatments, discuss the benefits and risks with your health care provider. What should I know about heart disease and stroke? Heart disease, heart attack, and  stroke become more likely as you age. This may be due, in part, to the hormonal changes that your body experiences during menopause. These can affect how your body processes dietary fats, triglycerides, and cholesterol. Heart attack and stroke are both medical emergencies. There are many things that you can do to help prevent heart disease and stroke:  Have your blood pressure checked at least every 1-2 years. High blood pressure causes heart disease and increases the risk of stroke.  If you are 43-62 years old, ask your health care provider if you should take aspirin to prevent a heart attack or a stroke.  Do not use any tobacco products, including cigarettes, chewing tobacco, or electronic cigarettes. If you need help quitting, ask your health care provider.  It is important to eat a healthy diet and maintain a healthy weight. ? Be sure to include plenty of vegetables, fruits, low-fat dairy products, and lean protein. ? Avoid eating foods that are high in solid fats, added sugars, or salt (sodium).  Get regular exercise. This is one of the most important things that you can do for your health. ? Try to exercise for at least 150 minutes each week. The type of exercise that you do should increase your heart rate and make you sweat. This is known as moderate-intensity exercise. ? Try to do strengthening exercises at least twice each week. Do these in addition to the moderate-intensity exercise.  Know your numbers.Ask your health care provider to check your cholesterol and your blood glucose. Continue to have your blood tested as directed by your health care provider.  What should I know about cancer screening? There are  several types of cancer. Take the following steps to reduce your risk and to catch any cancer development as early as possible. Breast Cancer  Practice breast self-awareness. ? This means understanding how your breasts normally appear and feel. ? It also means doing regular  breast self-exams. Let your health care provider know about any changes, no matter how small.  If you are 49 or older, have a clinician do a breast exam (clinical breast exam or CBE) every year. Depending on your age, family history, and medical history, it may be recommended that you also have a yearly breast X-ray (mammogram).  If you have a family history of breast cancer, talk with your health care provider about genetic screening.  If you are at high risk for breast cancer, talk with your health care provider about having an MRI and a mammogram every year.  Breast cancer (BRCA) gene test is recommended for women who have family members with BRCA-related cancers. Results of the assessment will determine the need for genetic counseling and BRCA1 and for BRCA2 testing. BRCA-related cancers include these types: ? Breast. This occurs in males or females. ? Ovarian. ? Tubal. This may also be called fallopian tube cancer. ? Cancer of the abdominal or pelvic lining (peritoneal cancer). ? Prostate. ? Pancreatic.  Cervical, Uterine, and Ovarian Cancer Your health care provider may recommend that you be screened regularly for cancer of the pelvic organs. These include your ovaries, uterus, and vagina. This screening involves a pelvic exam, which includes checking for microscopic changes to the surface of your cervix (Pap test).  For women ages 21-65, health care providers may recommend a pelvic exam and a Pap test every three years. For women ages 28-65, they may recommend the Pap test and pelvic exam, combined with testing for human papilloma virus (HPV), every five years. Some types of HPV increase your risk of cervical cancer. Testing for HPV may also be done on women of any age who have unclear Pap test results.  Other health care providers may not recommend any screening for nonpregnant women who are considered low risk for pelvic cancer and have no symptoms. Ask your health care provider if a  screening pelvic exam is right for you.  If you have had past treatment for cervical cancer or a condition that could lead to cancer, you need Pap tests and screening for cancer for at least 20 years after your treatment. If Pap tests have been discontinued for you, your risk factors (such as having a new sexual partner) need to be reassessed to determine if you should start having screenings again. Some women have medical problems that increase the chance of getting cervical cancer. In these cases, your health care provider may recommend that you have screening and Pap tests more often.  If you have a family history of uterine cancer or ovarian cancer, talk with your health care provider about genetic screening.  If you have vaginal bleeding after reaching menopause, tell your health care provider.  There are currently no reliable tests available to screen for ovarian cancer.  Lung Cancer Lung cancer screening is recommended for adults 53-31 years old who are at high risk for lung cancer because of a history of smoking. A yearly low-dose CT scan of the lungs is recommended if you:  Currently smoke.  Have a history of at least 30 pack-years of smoking and you currently smoke or have quit within the past 15 years. A pack-year is smoking an average of  one pack of cigarettes per day for one year.  Yearly screening should:  Continue until it has been 15 years since you quit.  Stop if you develop a health problem that would prevent you from having lung cancer treatment.  Colorectal Cancer  This type of cancer can be detected and can often be prevented.  Routine colorectal cancer screening usually begins at age 42 and continues through age 65.  If you have risk factors for colon cancer, your health care provider may recommend that you be screened at an earlier age.  If you have a family history of colorectal cancer, talk with your health care provider about genetic screening.  Your health  care provider may also recommend using home test kits to check for hidden blood in your stool.  A small camera at the end of a tube can be used to examine your colon directly (sigmoidoscopy or colonoscopy). This is done to check for the earliest forms of colorectal cancer.  Direct examination of the colon should be repeated every 5-10 years until age 40. However, if early forms of precancerous polyps or small growths are found or if you have a family history or genetic risk for colorectal cancer, you may need to be screened more often.  Skin Cancer  Check your skin from head to toe regularly.  Monitor any moles. Be sure to tell your health care provider: ? About any new moles or changes in moles, especially if there is a change in a mole's shape or color. ? If you have a mole that is larger than the size of a pencil eraser.  If any of your family members has a history of skin cancer, especially at a young age, talk with your health care provider about genetic screening.  Always use sunscreen. Apply sunscreen liberally and repeatedly throughout the day.  Whenever you are outside, protect yourself by wearing long sleeves, pants, a wide-brimmed hat, and sunglasses.  What should I know about osteoporosis? Osteoporosis is a condition in which bone destruction happens more quickly than new bone creation. After menopause, you may be at an increased risk for osteoporosis. To help prevent osteoporosis or the bone fractures that can happen because of osteoporosis, the following is recommended:  If you are 53-66 years old, get at least 1,000 mg of calcium and at least 600 mg of vitamin D per day.  If you are older than age 33 but younger than age 55, get at least 1,200 mg of calcium and at least 600 mg of vitamin D per day.  If you are older than age 5, get at least 1,200 mg of calcium and at least 800 mg of vitamin D per day.  Smoking and excessive alcohol intake increase the risk of  osteoporosis. Eat foods that are rich in calcium and vitamin D, and do weight-bearing exercises several times each week as directed by your health care provider. What should I know about how menopause affects my mental health? Depression may occur at any age, but it is more common as you become older. Common symptoms of depression include:  Low or sad mood.  Changes in sleep patterns.  Changes in appetite or eating patterns.  Feeling an overall lack of motivation or enjoyment of activities that you previously enjoyed.  Frequent crying spells.  Talk with your health care provider if you think that you are experiencing depression. What should I know about immunizations? It is important that you get and maintain your immunizations. These include:  Tetanus, diphtheria, and pertussis (Tdap) booster vaccine.  Influenza every year before the flu season begins.  Pneumonia vaccine.  Shingles vaccine.  Your health care provider may also recommend other immunizations. This information is not intended to replace advice given to you by your health care provider. Make sure you discuss any questions you have with your health care provider. Document Released: 09/17/2005 Document Revised: 02/13/2016 Document Reviewed: 04/29/2015 Elsevier Interactive Patient Education  2018 [employer]  

## 2017-11-09 ENCOUNTER — Other Ambulatory Visit: Payer: Self-pay

## 2017-11-09 ENCOUNTER — Emergency Department (HOSPITAL_COMMUNITY)
Admission: EM | Admit: 2017-11-09 | Discharge: 2017-11-09 | Disposition: A | Discharge status: Home / Self Care | Payer: Medicare Other | Attending: Emergency Medicine | Admitting: Emergency Medicine

## 2017-11-09 ENCOUNTER — Emergency Department (HOSPITAL_COMMUNITY): Payer: Medicare Other

## 2017-11-09 ENCOUNTER — Encounter (HOSPITAL_COMMUNITY): Payer: Self-pay

## 2017-11-09 DIAGNOSIS — Z87891 Personal history of nicotine dependence: Secondary | ICD-10-CM | POA: Diagnosis not present

## 2017-11-09 DIAGNOSIS — R109 Unspecified abdominal pain: Secondary | ICD-10-CM

## 2017-11-09 DIAGNOSIS — Z79899 Other long term (current) drug therapy: Secondary | ICD-10-CM | POA: Insufficient documentation

## 2017-11-09 DIAGNOSIS — I252 Old myocardial infarction: Secondary | ICD-10-CM | POA: Insufficient documentation

## 2017-11-09 DIAGNOSIS — Z7982 Long term (current) use of aspirin: Secondary | ICD-10-CM | POA: Diagnosis not present

## 2017-11-09 DIAGNOSIS — I2510 Atherosclerotic heart disease of native coronary artery without angina pectoris: Secondary | ICD-10-CM | POA: Insufficient documentation

## 2017-11-09 LAB — URINALYSIS, ROUTINE W REFLEX MICROSCOPIC
BILIRUBIN URINE: NEGATIVE
GLUCOSE, UA: NEGATIVE mg/dL
HGB URINE DIPSTICK: NEGATIVE
KETONES UR: NEGATIVE mg/dL
Leukocytes, UA: NEGATIVE
Nitrite: NEGATIVE
PH: 5 (ref 5.0–8.0)
Protein, ur: NEGATIVE mg/dL
Specific Gravity, Urine: 1.005 (ref 1.005–1.030)

## 2017-11-09 LAB — CBC
HEMATOCRIT: 36 % (ref 36.0–46.0)
Hemoglobin: 11.8 g/dL — ABNORMAL LOW (ref 12.0–15.0)
MCH: 30.5 pg (ref 26.0–34.0)
MCHC: 32.8 g/dL (ref 30.0–36.0)
MCV: 93 fL (ref 78.0–100.0)
Platelets: 306 10*3/uL (ref 150–400)
RBC: 3.87 MIL/uL (ref 3.87–5.11)
RDW: 13.6 % (ref 11.5–15.5)
WBC: 3.5 10*3/uL — ABNORMAL LOW (ref 4.0–10.5)

## 2017-11-09 LAB — COMPREHENSIVE METABOLIC PANEL
ALBUMIN: 4.1 g/dL (ref 3.5–5.0)
ALT: 20 U/L (ref 14–54)
AST: 36 U/L (ref 15–41)
Alkaline Phosphatase: 60 U/L (ref 38–126)
Anion gap: 10 (ref 5–15)
BILIRUBIN TOTAL: 0.6 mg/dL (ref 0.3–1.2)
BUN: 18 mg/dL (ref 6–20)
CHLORIDE: 104 mmol/L (ref 101–111)
CO2: 23 mmol/L (ref 22–32)
Calcium: 9.6 mg/dL (ref 8.9–10.3)
Creatinine, Ser: 1.06 mg/dL — ABNORMAL HIGH (ref 0.44–1.00)
GFR calc Af Amer: 56 mL/min — ABNORMAL LOW (ref >60)
GFR calc non Af Amer: 48 mL/min — ABNORMAL LOW (ref >60)
GLUCOSE: 105 mg/dL — AB (ref 65–99)
POTASSIUM: 3.8 mmol/L (ref 3.5–5.1)
Sodium: 137 mmol/L (ref 135–145)
Total Protein: 7.2 g/dL (ref 6.5–8.1)

## 2017-11-09 LAB — LIPASE, BLOOD: Lipase: 47 U/L (ref 11–51)

## 2017-11-09 MED ORDER — HYDROCODONE-ACETAMINOPHEN 5-325 MG PO TABS: 1.0000 | ORAL_TABLET | Freq: Four times a day (QID) | ORAL | 0 refills | Status: DC | PRN

## 2017-11-09 MED ORDER — FENTANYL CITRATE (PF) 100 MCG/2ML IJ SOLN
50.0000 ug | Freq: Once | INTRAMUSCULAR | Status: AC
  Administered 2017-11-09: 50 ug via INTRAMUSCULAR
  Filled 2017-11-09: qty 2

## 2017-11-09 MED ORDER — HYDROCODONE-ACETAMINOPHEN 5-325 MG PO TABS
1.0000 | ORAL_TABLET | Freq: Once | ORAL | Status: AC
  Administered 2017-11-09: 1 via ORAL
  Filled 2017-11-09: qty 1

## 2017-11-09 MED ORDER — MORPHINE SULFATE (PF) 4 MG/ML IV SOLN: 4.0000 mg | Freq: Once | INTRAVENOUS | Status: DC

## 2017-11-09 NOTE — ED Notes (Signed)
Patient transported to CT 

## 2017-11-09 NOTE — ED Notes (Signed)
Pt refusing transport to CT before receiving pain meds.

## 2017-11-09 NOTE — ED Triage Notes (Signed)
Patient complains of sharp left flank pain with no associated symptoms x 3 days, denies dysuria, denies trauma

## 2017-11-09 NOTE — Discharge Instructions (Signed)
You take Tylenol 1000 mg 3 times a day for pain.  He can take pain medication for severe breakthrough pain.  Follow-up with your primary care doctor in the next 24-40 hours for further evaluation.  If you do not have a primary care doctor, you can use the clinic listed above.  Return to the emergency department for any worsening pain, chest pain, difficulty breathing, numbness/weakness of her arms or legs, pain with urination or any other worsening or concerning symptoms.

## 2017-11-09 NOTE — ED Provider Notes (Signed)
[facility] EMERGENCY DEPARTMENT Provider Note   CSN: [id number] Arrival date & time: 11/09/17  1307     History   Chief Complaint Chief Complaint  Patient presents with  . Flank Pain    HPI Adriana Jordan is a 83 y.o. female who presents for evaluation of left flank pain that is been ongoing for the last 3 days.  Patient reports that pain is been intermittent and describes it as a sharp pain that radiates into the left abdomen and down towards the left groin.  Patient states that she has taken Advil for the pain with minimal improvement.  Patient states that the pain is worsened with movement.  She has not noticed a rash to the area.  She denies any preceding trauma, injury, fall.  Patient states that she went to urgent care earlier today for evaluation of symptoms and was prompted to go to the ED for further evaluation.  Patient states that she has not had any urinating, hematuria.  Patient denies any fever, chest pain, difficulty breathing, numbness/weakness of her arms or legs, difficulty ambulating, nausea/vomiting, dysuria, hematuria, rash.   The history is provided by the patient.    Past Medical History:  Diagnosis Date  . Arthritis   . Brain tumor ([place])   . Coronary artery disease    Dr. Doylene Canard  . GERD (gastroesophageal reflux disease)   . Hypercholesteremia   . MI (myocardial infarction) ([place]) 2006  . PONV (postoperative nausea and vomiting)     Patient Active Problem List   Diagnosis Date Noted  . Adenoma 05/21/2015  . Pituitary tumor ([place]) 05/21/2015  . Pituitary adenoma ([place]) 04/07/2015    Past Surgical History:  Procedure Laterality Date  . BACK SURGERY    . CERVICAL SPINE SURGERY    . CORONARY ANGIOPLASTY     DES mid LAD and mid RCA 09/21/04  . CRANIOTOMY N/A 05/21/2015   Procedure: Transsphenoidal resection of pituitary tumor with Dr. Radene Journey for approach;  Surgeon: Leeroy Cha, MD;  Location: [facility] NEURO ORS;  Service:  Neurosurgery;  Laterality: N/A;  Transsphenoidal resection of pituitary tumor with Dr. Radene Journey for approach  . ROTATOR CUFF REPAIR    . thumb surgery Right    placed srews to make straight     OB History    Gravida  1   Para  1   Term      Preterm      AB      Living  1     SAB      TAB      Ectopic      Multiple      Live Births               Home Medications    Prior to Admission medications   Medication Sig Start Date End Date Taking? Authorizing Provider  Artificial Tear Ointment (DRY EYES OP) Place 2 drops into both eyes daily as needed.   Yes [provider]  aspirin 81 MG tablet Take 81 mg by mouth daily.   Yes [provider]  losartan-hydrochlorothiazide (HYZAAR) 100-12.5 MG tablet Take 1 tablet by mouth daily. 08/11/17  Yes [provider]  Multiple Vitamin (MULTIVITAMIN) tablet Take 1 tablet by mouth daily. Women   Yes [provider]  omeprazole (PRILOSEC) 20 MG capsule Take 20 mg by mouth daily.   Yes [provider]  simvastatin (ZOCOR) 40 MG tablet Take 40 mg by mouth  daily.   Yes [provider]  HYDROcodone-acetaminophen (NORCO/VICODIN) 5-325 MG tablet Take 1-2 tablets by mouth every 6 (six) hours as needed. 11/09/17   Volanda Napoleon, PA-C    Family History Family History  Problem Relation Age of Onset  . Cancer Father 60       throat- smoker   . Breast cancer Sister 61  . Cancer Brother 30       lung- smoker    Social History Social History   Tobacco Use  . Smoking status: Former Smoker    Last attempt to quit: 04/26/1969    Years since quitting: 48.5  . Smokeless tobacco: Never Used  Substance Use Topics  . Alcohol use: No    Comment: WINE- OCC  . Drug use: No     Allergies   Patient has no known allergies.   Review of Systems Review of Systems  Constitutional: Negative for chills and fever.  Respiratory: Negative for cough and shortness of breath.     Cardiovascular: Negative for chest pain.  Gastrointestinal: Negative for abdominal pain, diarrhea, nausea and vomiting.  Genitourinary: Positive for flank pain. Negative for dysuria and hematuria.  Musculoskeletal: Negative for back pain and neck pain.  Neurological: Negative for dizziness, weakness, numbness and headaches.  All other systems reviewed and are negative.    Physical Exam Updated Vital Signs BP (!) 174/80   Pulse 73   Temp 97.9 F (36.6 C) (Oral)   Resp 17   Ht 5\' 6"  (1.676 m)   Wt 71.2 kg (157 lb)   LMP  (LMP Unknown)   SpO2 95%   BMI 25.34 kg/m   Physical Exam  Constitutional: She is oriented to person, place, and time. She appears well-developed and well-nourished.  HENT:  Head: Normocephalic and atraumatic.  Mouth/Throat: Oropharynx is clear and moist and mucous membranes are normal.  Eyes: Pupils are equal, round, and reactive to light. Conjunctivae, EOM and lids are normal.  Neck: Full passive range of motion without pain.  Cardiovascular: Normal rate, regular rhythm, normal heart sounds and normal pulses. Exam reveals no gallop and no friction rub.  No murmur heard. Pulses:      Radial pulses are 2+ on the right side, and 2+ on the left side.       Dorsalis pedis pulses are 2+ on the right side, and 2+ on the left side.  Pulmonary/Chest: Effort normal and breath sounds normal.  Abdominal: Soft. Normal appearance. There is tenderness in the left lower quadrant. There is no rigidity and no guarding.    Musculoskeletal: Normal range of motion.       Thoracic back: She exhibits no tenderness.       Lumbar back: She exhibits no tenderness.       Arms: Neurological: She is alert and oriented to person, place, and time.  Follows commands, Moves all extremities  5/5 strength to BUE and BLE  Sensation intact throughout all major nerve distributions  Skin: Skin is warm and dry. Capillary refill takes less than 2 seconds.  No rash noted   Psychiatric: She  has a normal mood and affect. Her speech is normal.  Nursing note and vitals reviewed.    ED Treatments / Results  Labs (all labs ordered are listed, but only abnormal results are displayed) Labs Reviewed  COMPREHENSIVE METABOLIC PANEL - Abnormal; Notable for the following components:      Result Value   Glucose, Bld 105 (*)    Creatinine, Ser  1.06 (*)    GFR calc non Af Amer 48 (*)    GFR calc Af Amer 56 (*)    All other components within normal limits  CBC - Abnormal; Notable for the following components:   WBC 3.5 (*)    Hemoglobin 11.8 (*)    All other components within normal limits  URINALYSIS, ROUTINE W REFLEX MICROSCOPIC - Abnormal; Notable for the following components:   Color, Urine STRAW (*)    All other components within normal limits  LIPASE, BLOOD    EKG None  Radiology Ct Renal Stone Study  Result Date: 11/09/2017 CLINICAL DATA:  Left flank pain for 3 days. Sharp and intermittent pain. EXAM: CT ABDOMEN AND PELVIS WITHOUT CONTRAST TECHNIQUE: Multidetector CT imaging of the abdomen and pelvis was performed following the standard protocol without IV contrast. COMPARISON:  12/23/2005 FINDINGS: Lower chest: Focal scarring in the right lung base. Coronary artery calcifications. Hepatobiliary: No focal liver abnormality is seen. No gallstones, gallbladder wall thickening, or biliary dilatation. Pancreas: Unremarkable. No pancreatic ductal dilatation or surrounding inflammatory changes. Spleen: Normal in size without focal abnormality. Adrenals/Urinary Tract: Adrenal glands are unremarkable. Kidneys are normal, without renal calculi, focal lesion, or hydronephrosis. Bladder is unremarkable. Stomach/Bowel: Stomach is within normal limits. Appendix is not identified. No evidence of bowel wall thickening, distention, or inflammatory changes. Vascular/Lymphatic: Aortic atherosclerosis. No enlarged abdominal or pelvic lymph nodes. Flat IVC may indicate hypovolemia. Reproductive:  Uterus and bilateral adnexa are unremarkable. Other: Small periumbilical hernia containing fat. No free air or free fluid. Musculoskeletal: Fatty atrophy of the gluteal musculature. Degenerative changes in the spine. Calcifications in the subcutaneous fat over the gluteal regions likely representing injection granulomas. IMPRESSION: 1. No renal or ureteral stone or obstructions. 2. No bowel obstruction or inflammation. 3. Small periumbilical hernia containing fat. 4. Aortic atherosclerosis. Electronically Signed   By: Lucienne Capers M.D.   On: 11/09/2017 21:14    Procedures Procedures (including critical care time)  Medications Ordered in ED Medications  fentaNYL (SUBLIMAZE) injection 50 mcg (50 mcg Intramuscular Given 11/09/17 2033)  HYDROcodone-acetaminophen (NORCO/VICODIN) 5-325 MG per tablet 1 tablet (1 tablet Oral Given 11/09/17 2041)     Initial Impression / Assessment and Plan / ED Course  I have reviewed the triage vital signs and the nursing notes.  Pertinent labs & imaging results that were available during my care of the patient were reviewed by me and considered in my medical decision making (see chart for details).     83 year old female who presents for evaluation of left flank pain.  Reports intermittent sharp pain that radiates around to the left abdomen and groin.  No preceding trauma, injury, fall.  No urinary complaints, nausea/vomiting.  No numbness/weakness of her extremities.  Went to urgent care today and was told to come to the ED for further evaluation. Patient is afebrile, non-toxic appearing, sitting comfortably on examination table. Vital signs reviewed and stable.  Patient is slightly hypertensive.  On exam, patient has tenderness overlying the paraspinal muscles of the lumbar region that extend over into the left flank and left abdomen.  No midline bony tenderness noted to the T or L-spine.  No rash noted.  Normal strength in upper and lower extremities bilaterally.   Consider musculoskeletal pain versus kidney stone versus UTI.  History/physical exam is not concerning for diverticulitis, cauda equina, spinal abscess.  Low suspicion for aortic aneurysm given history/physical exam but a consideration given patient's history and hypertension.  Initial labs ordered at triage.  Will plan for analgesics here in the department.  CBC shows leukopenia.  This appears to be consistent with.  Patient's previous.  Lipase unremarkable.  CMP shows creatinine at 1.06 but otherwise unremarkable.  UA is negative for any hemoglobin, infectious etiology.  Given patient's distribution of pain, will continue with CT imaging for further evaluation.  CT renal stone study reviewed.  Negative for any acute kidney stones.  No other acute abnormalities.  Discussed results with patient.  Symptoms likely result of musculoskeletal strain.  We will plan to treat with symptomatic relief at home.  Patient encouraged to follow-up with primary care doctor in the next 24-40 hours for further evaluation. Patient had ample opportunity for questions and discussion. All patient's questions were answered with full understanding. Strict return precautions discussed. Patient expresses understanding and agreement to plan.   Final Clinical Impressions(s) / ED Diagnoses   Final diagnoses:  Left flank pain    ED Discharge Orders        Ordered    HYDROcodone-acetaminophen (NORCO/VICODIN) 5-325 MG tablet  Every 6 hours PRN     11/09/17 2128       Volanda Napoleon, PA-C 11/09/17 2143    Sherwood Gambler, MD 11/10/17 2259

## 2017-11-14 ENCOUNTER — Emergency Department (HOSPITAL_COMMUNITY): Payer: Medicare Other

## 2017-11-14 ENCOUNTER — Observation Stay (HOSPITAL_COMMUNITY)
Admission: EM | Admit: 2017-11-14 | Discharge: 2017-11-16 | Disposition: A | Discharge status: Home / Self Care | Payer: Medicare Other | Attending: Internal Medicine | Admitting: Internal Medicine

## 2017-11-14 ENCOUNTER — Other Ambulatory Visit: Payer: Self-pay

## 2017-11-14 ENCOUNTER — Encounter (HOSPITAL_COMMUNITY): Payer: Self-pay | Admitting: Emergency Medicine

## 2017-11-14 DIAGNOSIS — Z87891 Personal history of nicotine dependence: Secondary | ICD-10-CM | POA: Insufficient documentation

## 2017-11-14 DIAGNOSIS — N189 Chronic kidney disease, unspecified: Secondary | ICD-10-CM | POA: Insufficient documentation

## 2017-11-14 DIAGNOSIS — N179 Acute kidney failure, unspecified: Secondary | ICD-10-CM | POA: Diagnosis not present

## 2017-11-14 DIAGNOSIS — I252 Old myocardial infarction: Secondary | ICD-10-CM | POA: Insufficient documentation

## 2017-11-14 DIAGNOSIS — R748 Abnormal levels of other serum enzymes: Secondary | ICD-10-CM | POA: Diagnosis present

## 2017-11-14 DIAGNOSIS — J101 Influenza due to other identified influenza virus with other respiratory manifestations: Principal | ICD-10-CM

## 2017-11-14 DIAGNOSIS — I7389 Other specified peripheral vascular diseases: Secondary | ICD-10-CM | POA: Insufficient documentation

## 2017-11-14 DIAGNOSIS — R0902 Hypoxemia: Secondary | ICD-10-CM

## 2017-11-14 DIAGNOSIS — G319 Degenerative disease of nervous system, unspecified: Secondary | ICD-10-CM | POA: Insufficient documentation

## 2017-11-14 DIAGNOSIS — Z7982 Long term (current) use of aspirin: Secondary | ICD-10-CM | POA: Diagnosis not present

## 2017-11-14 DIAGNOSIS — Z79899 Other long term (current) drug therapy: Secondary | ICD-10-CM | POA: Diagnosis not present

## 2017-11-14 DIAGNOSIS — D72819 Decreased white blood cell count, unspecified: Secondary | ICD-10-CM | POA: Diagnosis present

## 2017-11-14 DIAGNOSIS — I2510 Atherosclerotic heart disease of native coronary artery without angina pectoris: Secondary | ICD-10-CM | POA: Insufficient documentation

## 2017-11-14 DIAGNOSIS — M6282 Rhabdomyolysis: Secondary | ICD-10-CM | POA: Diagnosis not present

## 2017-11-14 DIAGNOSIS — I10 Essential (primary) hypertension: Secondary | ICD-10-CM | POA: Diagnosis not present

## 2017-11-14 DIAGNOSIS — K219 Gastro-esophageal reflux disease without esophagitis: Secondary | ICD-10-CM | POA: Diagnosis not present

## 2017-11-14 DIAGNOSIS — E785 Hyperlipidemia, unspecified: Secondary | ICD-10-CM | POA: Diagnosis not present

## 2017-11-14 DIAGNOSIS — E876 Hypokalemia: Secondary | ICD-10-CM | POA: Insufficient documentation

## 2017-11-14 DIAGNOSIS — D352 Benign neoplasm of pituitary gland: Secondary | ICD-10-CM | POA: Insufficient documentation

## 2017-11-14 DIAGNOSIS — I129 Hypertensive chronic kidney disease with stage 1 through stage 4 chronic kidney disease, or unspecified chronic kidney disease: Secondary | ICD-10-CM | POA: Insufficient documentation

## 2017-11-14 DIAGNOSIS — J9601 Acute respiratory failure with hypoxia: Secondary | ICD-10-CM | POA: Diagnosis present

## 2017-11-14 DIAGNOSIS — J111 Influenza due to unidentified influenza virus with other respiratory manifestations: Secondary | ICD-10-CM | POA: Diagnosis not present

## 2017-11-14 DIAGNOSIS — I700 Atherosclerosis of aorta: Secondary | ICD-10-CM | POA: Insufficient documentation

## 2017-11-14 DIAGNOSIS — E7800 Pure hypercholesterolemia, unspecified: Secondary | ICD-10-CM | POA: Insufficient documentation

## 2017-11-14 HISTORY — DX: Unspecified malignant neoplasm of skin, unspecified: C44.90

## 2017-11-14 HISTORY — DX: Essential (primary) hypertension: I10

## 2017-11-14 HISTORY — DX: Low back pain: M54.5

## 2017-11-14 HISTORY — DX: Personal history of other medical treatment: Z92.89

## 2017-11-14 HISTORY — DX: Other chronic pain: G89.29

## 2017-11-14 HISTORY — DX: Influenza due to other identified influenza virus with other respiratory manifestations: J10.1

## 2017-11-14 HISTORY — DX: Low back pain, unspecified: M54.50

## 2017-11-14 LAB — CBC WITH DIFFERENTIAL/PLATELET
Basophils Absolute: 0 10*3/uL (ref 0.0–0.1)
Basophils Relative: 1 %
EOS ABS: 0 10*3/uL (ref 0.0–0.7)
Eosinophils Relative: 1 %
HEMATOCRIT: 36.2 % (ref 36.0–46.0)
HEMOGLOBIN: 12.1 g/dL (ref 12.0–15.0)
LYMPHS ABS: 0.8 10*3/uL (ref 0.7–4.0)
LYMPHS PCT: 35 %
MCH: 31 pg (ref 26.0–34.0)
MCHC: 33.4 g/dL (ref 30.0–36.0)
MCV: 92.8 fL (ref 78.0–100.0)
Monocytes Absolute: 0.5 10*3/uL (ref 0.1–1.0)
Monocytes Relative: 19 %
NEUTROS ABS: 1 10*3/uL — AB (ref 1.7–7.7)
NEUTROS PCT: 44 %
Platelets: 256 10*3/uL (ref 150–400)
RBC: 3.9 MIL/uL (ref 3.87–5.11)
RDW: 13.4 % (ref 11.5–15.5)
WBC: 2.4 10*3/uL — AB (ref 4.0–10.5)

## 2017-11-14 LAB — COMPREHENSIVE METABOLIC PANEL
ALT: 22 U/L (ref 14–54)
ANION GAP: 15 (ref 5–15)
AST: 43 U/L — ABNORMAL HIGH (ref 15–41)
Albumin: 3.7 g/dL (ref 3.5–5.0)
Alkaline Phosphatase: 61 U/L (ref 38–126)
BUN: 19 mg/dL (ref 6–20)
CHLORIDE: 96 mmol/L — AB (ref 101–111)
CO2: 22 mmol/L (ref 22–32)
Calcium: 9.2 mg/dL (ref 8.9–10.3)
Creatinine, Ser: 1.38 mg/dL — ABNORMAL HIGH (ref 0.44–1.00)
GFR calc non Af Amer: 35 mL/min — ABNORMAL LOW (ref >60)
GFR, EST AFRICAN AMERICAN: 40 mL/min — AB (ref >60)
Glucose, Bld: 95 mg/dL (ref 65–99)
POTASSIUM: 3.6 mmol/L (ref 3.5–5.1)
SODIUM: 133 mmol/L — AB (ref 135–145)
Total Bilirubin: 0.6 mg/dL (ref 0.3–1.2)
Total Protein: 7.2 g/dL (ref 6.5–8.1)

## 2017-11-14 LAB — INFLUENZA PANEL BY PCR (TYPE A & B)
INFLBPCR: NEGATIVE
Influenza A By PCR: POSITIVE — AB

## 2017-11-14 LAB — URINALYSIS, ROUTINE W REFLEX MICROSCOPIC
BILIRUBIN URINE: NEGATIVE
Bacteria, UA: NONE SEEN
Glucose, UA: NEGATIVE mg/dL
Hgb urine dipstick: NEGATIVE
Ketones, ur: 5 mg/dL — AB
NITRITE: NEGATIVE
PH: 5 (ref 5.0–8.0)
Protein, ur: NEGATIVE mg/dL
SPECIFIC GRAVITY, URINE: 1.013 (ref 1.005–1.030)

## 2017-11-14 LAB — BRAIN NATRIURETIC PEPTIDE: B Natriuretic Peptide: 13.3 pg/mL (ref 0.0–100.0)

## 2017-11-14 LAB — PROTIME-INR
INR: 0.9
PROTHROMBIN TIME: 12 s (ref 11.4–15.2)

## 2017-11-14 LAB — RAPID HIV SCREEN (HIV 1/2 AB+AG)
HIV 1/2 Antibodies: NONREACTIVE
HIV-1 P24 ANTIGEN - HIV24: NONREACTIVE

## 2017-11-14 LAB — I-STAT TROPONIN, ED: TROPONIN I, POC: 0 ng/mL (ref 0.00–0.08)

## 2017-11-14 LAB — CK: Total CK: 296 U/L — ABNORMAL HIGH (ref 38–234)

## 2017-11-14 LAB — I-STAT CG4 LACTIC ACID, ED: LACTIC ACID, VENOUS: 1.08 mmol/L (ref 0.5–1.9)

## 2017-11-14 MED ORDER — SODIUM CHLORIDE 0.9 % IV SOLN
INTRAVENOUS | Status: DC
  Administered 2017-11-14 – 2017-11-15 (×2): via INTRAVENOUS

## 2017-11-14 MED ORDER — ACETAMINOPHEN 325 MG PO TABS
650.0000 mg | ORAL_TABLET | Freq: Four times a day (QID) | ORAL | Status: DC | PRN
  Administered 2017-11-15: 650 mg via ORAL
  Filled 2017-11-14: qty 2

## 2017-11-14 MED ORDER — ONDANSETRON HCL 4 MG/2ML IJ SOLN: 4.0000 mg | Freq: Four times a day (QID) | INTRAMUSCULAR | Status: DC | PRN

## 2017-11-14 MED ORDER — BENZONATATE 100 MG PO CAPS
100.0000 mg | ORAL_CAPSULE | Freq: Once | ORAL | Status: AC
  Administered 2017-11-14: 100 mg via ORAL
  Filled 2017-11-14: qty 1

## 2017-11-14 MED ORDER — SENNOSIDES-DOCUSATE SODIUM 8.6-50 MG PO TABS: 1.0000 | ORAL_TABLET | Freq: Every evening | ORAL | Status: DC | PRN

## 2017-11-14 MED ORDER — ACETAMINOPHEN 650 MG RE SUPP: 650.0000 mg | Freq: Four times a day (QID) | RECTAL | Status: DC | PRN

## 2017-11-14 MED ORDER — ACETAMINOPHEN 325 MG PO TABS
650.0000 mg | ORAL_TABLET | Freq: Once | ORAL | Status: AC
  Administered 2017-11-14: 650 mg via ORAL
  Filled 2017-11-14: qty 2

## 2017-11-14 MED ORDER — PANTOPRAZOLE SODIUM 40 MG PO TBEC
40.0000 mg | DELAYED_RELEASE_TABLET | Freq: Every day | ORAL | Status: DC
  Administered 2017-11-15 – 2017-11-16 (×2): 40 mg via ORAL
  Filled 2017-11-14 (×2): qty 1

## 2017-11-14 MED ORDER — OSELTAMIVIR PHOSPHATE 30 MG PO CAPS
30.0000 mg | ORAL_CAPSULE | Freq: Two times a day (BID) | ORAL | Status: DC
  Administered 2017-11-14 – 2017-11-16 (×5): 30 mg via ORAL
  Filled 2017-11-14 (×5): qty 1

## 2017-11-14 MED ORDER — HYDROCODONE-ACETAMINOPHEN 5-325 MG PO TABS: 1.0000 | ORAL_TABLET | Freq: Four times a day (QID) | ORAL | Status: DC | PRN

## 2017-11-14 MED ORDER — HEPARIN SODIUM (PORCINE) 5000 UNIT/ML IJ SOLN
5000.0000 [IU] | Freq: Three times a day (TID) | INTRAMUSCULAR | Status: DC
  Administered 2017-11-14 – 2017-11-16 (×5): 5000 [IU] via SUBCUTANEOUS
  Filled 2017-11-14 (×5): qty 1

## 2017-11-14 MED ORDER — SIMVASTATIN 40 MG PO TABS
40.0000 mg | ORAL_TABLET | Freq: Every day | ORAL | Status: DC
  Administered 2017-11-15 – 2017-11-16 (×2): 40 mg via ORAL
  Filled 2017-11-14 (×2): qty 1

## 2017-11-14 MED ORDER — SODIUM CHLORIDE 0.9 % IV BOLUS
500.0000 mL | Freq: Once | INTRAVENOUS | Status: AC
  Administered 2017-11-14: 500 mL via INTRAVENOUS

## 2017-11-14 MED ORDER — BISACODYL 10 MG RE SUPP: 10.0000 mg | Freq: Every day | RECTAL | Status: DC | PRN

## 2017-11-14 MED ORDER — ONDANSETRON HCL 4 MG PO TABS: 4.0000 mg | ORAL_TABLET | Freq: Four times a day (QID) | ORAL | Status: DC | PRN

## 2017-11-14 NOTE — Progress Notes (Signed)
Pt arrived to 6n16 at this time. Placed on droplet.

## 2017-11-14 NOTE — ED Provider Notes (Signed)
[facility] EMERGENCY DEPARTMENT Provider Note   CSN: [id number] Arrival date & time: 11/14/17  1204     History   Chief Complaint Chief Complaint  Patient presents with  . Headache    HPI Adriana Jordan is a 83 y.o. female with a history of transsphenoidal resection of pituitary tumor in 2016, MI, CAD, who presents today from [facility] urgent care today for evaluation of headache.  She reports that on Saturday she had a sudden onset of headache.  She reports that her head hurts most in the front and in her eyes.  When asked when the last time she had a headache this bad as she reports never.  She reports mild photophobia and generally feeling weak.  She reports that she has been in bed since Saturday and has not gotten out of bed to eat, drink, or take her medications.    She was reportedly borderline hypotensive at urgent care with a blood pressure of 98/60, pulse rate of 104 and a tympanic temperature of 100.8.  According to urgent care notes she has not had any Tylenol or ibuprofen and they did not give her any.  She also reports that since Saturday she has been coughing up yellow mucus.  Remote smoking history.  Urgent care question pneumonia.  She denies any recent trauma.  She was in the ED on 4/3 for flank pain and a possible UTI.      HPI  Past Medical History:  Diagnosis Date  . Arthritis   . Brain tumor ([place])   . Coronary artery disease    Dr. Doylene Canard  . GERD (gastroesophageal reflux disease)   . Hypercholesteremia   . MI (myocardial infarction) ([place]) 2006  . PONV (postoperative nausea and vomiting)     Patient Active Problem List   Diagnosis Date Noted  . Adenoma 05/21/2015  . Pituitary tumor ([place]) 05/21/2015  . Pituitary adenoma ([place]) 04/07/2015    Past Surgical History:  Procedure Laterality Date  . BACK SURGERY    . CERVICAL SPINE SURGERY    . CORONARY ANGIOPLASTY     DES mid LAD and mid RCA 09/21/04  . CRANIOTOMY N/A 05/21/2015   Procedure: Transsphenoidal resection of pituitary tumor with Dr. Radene Journey for approach;  Surgeon: Leeroy Cha, MD;  Location: [facility] NEURO ORS;  Service: Neurosurgery;  Laterality: N/A;  Transsphenoidal resection of pituitary tumor with Dr. Radene Journey for approach  . ROTATOR CUFF REPAIR    . thumb surgery Right    placed srews to make straight     OB History    Gravida  1   Para  1   Term      Preterm      AB      Living  1     SAB      TAB      Ectopic      Multiple      Live Births               Home Medications    Prior to Admission medications   Medication Sig Start Date End Date Taking? Authorizing Provider  Artificial Tear Ointment (DRY EYES OP) Place 2 drops into both eyes daily as needed.   Yes [provider]  aspirin 81 MG tablet Take 81 mg by mouth daily.   Yes [provider]  HYDROcodone-acetaminophen (NORCO/VICODIN) 5-325 MG tablet Take 1-2 tablets by mouth every 6 (six) hours as needed. 11/09/17  Yes Volanda Napoleon, PA-C  losartan-hydrochlorothiazide (HYZAAR) 100-12.5 MG tablet Take 1 tablet by mouth daily. 08/11/17  Yes [provider]  Multiple Vitamin (MULTIVITAMIN) tablet Take 1 tablet by mouth daily. Women   Yes [provider]  omeprazole (PRILOSEC) 20 MG capsule Take 20 mg by mouth daily.   Yes [provider]  simvastatin (ZOCOR) 40 MG tablet Take 40 mg by mouth daily.   Yes [provider]    Family History Family History  Problem Relation Age of Onset  . Cancer Father 60       throat- smoker   . Breast cancer Sister 72  . Cancer Brother 89       lung- smoker    Social History Social History   Tobacco Use  . Smoking status: Former Smoker    Last attempt to quit: 04/26/1969    Years since quitting: 48.5  . Smokeless tobacco: Never Used  Substance Use Topics  . Alcohol use: No    Comment: WINE- OCC  . Drug use: No     Allergies   Patient has no known  allergies.   Review of Systems Review of Systems  Constitutional: Positive for fever (at urgent care). Negative for appetite change and chills.  HENT: Negative for congestion, ear pain, sinus pressure, sinus pain and sore throat.   Respiratory: Positive for cough. Negative for chest tightness and shortness of breath.   Cardiovascular: Negative for chest pain.  Gastrointestinal: Negative for abdominal pain, nausea and vomiting.  Genitourinary: Negative for dysuria and flank pain.  Musculoskeletal: Positive for myalgias.  Skin: Negative for rash.  Neurological: Positive for weakness (all over) and headaches. Negative for dizziness and light-headedness.  Psychiatric/Behavioral: Negative for confusion.     Physical Exam Updated Vital Signs BP 123/72   Pulse 85   Temp 100.3 F (37.9 C) (Rectal)   Resp (!) 9   Ht 5\' 6"  (1.676 m)   Wt 71.2 kg (157 lb)   LMP  (LMP Unknown)   SpO2 95%   BMI 25.34 kg/m   Physical Exam  Constitutional: She is oriented to person, place, and time. She appears well-developed and well-nourished. She appears ill (Acutely ill appearing).  HENT:  Head: Normocephalic and atraumatic.  Mouth/Throat: Oropharynx is clear and moist.  Eyes: Pupils are equal, round, and reactive to light. Conjunctivae and EOM are normal. Right eye exhibits no discharge. Left eye exhibits no discharge. No scleral icterus. Right eye exhibits no nystagmus. Left eye exhibits no nystagmus.  Neck: Normal range of motion. Neck supple. No JVD present. No neck rigidity.  Cardiovascular: Normal rate, regular rhythm, normal heart sounds and intact distal pulses.  No murmur heard. Pulmonary/Chest: Effort normal and breath sounds normal. No stridor. No respiratory distress.  Abdominal: Soft. Bowel sounds are normal. She exhibits no distension and no mass. There is no guarding.  Musculoskeletal: She exhibits no edema or deformity.  Neurological: She is alert and oriented to person, place, and  time. She has normal strength. No cranial nerve deficit or sensory deficit. She exhibits normal muscle tone. GCS eye subscore is 4. GCS verbal subscore is 5. GCS motor subscore is 6.  Mental Status:  Alert, oriented, thought content appropriate, able to give a coherent history. Speech fluent without evidence of aphasia. Able to follow 2 step commands without difficulty.  Cranial Nerves:  II:  Peripheral visual fields grossly normal, pupils equal, round, reactive to light III,IV, VI: ptosis not present, extra-ocular motions intact bilaterally  V,VII: smile symmetric, facial light touch sensation equal VIII: hearing grossly normal to voice  X: uvula elevates symmetrically  XI: bilateral shoulder shrug symmetric and strong XII: midline tongue extension without fassiculations Motor:  Normal tone. 5/5 in upper and lower extremities bilaterally including strong and equal grip strength and dorsiflexion/plantar flexion, and all major extremity muscle groups.  Cerebellar: normal finger-to-nose with bilateral upper extremities CV: distal pulses palpable throughout    Skin: Skin is warm and dry. She is not diaphoretic.  Psychiatric: She has a normal mood and affect. Her behavior is normal.  Nursing note and vitals reviewed.    ED Treatments / Results  Labs (all labs ordered are listed, but only abnormal results are displayed) Labs Reviewed  COMPREHENSIVE METABOLIC PANEL - Abnormal; Notable for the following components:      Result Value   Sodium 133 (*)    Chloride 96 (*)    Creatinine, Ser 1.38 (*)    AST 43 (*)    GFR calc non Af Amer 35 (*)    GFR calc Af Amer 40 (*)    All other components within normal limits  CBC WITH DIFFERENTIAL/PLATELET - Abnormal; Notable for the following components:   WBC 2.4 (*)    Neutro Abs 1.0 (*)    All other components within normal limits  INFLUENZA PANEL BY PCR (TYPE A & B) - Abnormal; Notable for the following components:   Influenza A By PCR  POSITIVE (*)    All other components within normal limits  URINALYSIS, ROUTINE W REFLEX MICROSCOPIC - Abnormal; Notable for the following components:   Ketones, ur 5 (*)    Leukocytes, UA TRACE (*)    Squamous Epithelial / LPF 0-5 (*)    All other components within normal limits  CK - Abnormal; Notable for the following components:   Total CK 296 (*)    All other components within normal limits  URINE CULTURE  CULTURE, BLOOD (ROUTINE X 2)  CULTURE, BLOOD (ROUTINE X 2)  PROTIME-INR  RAPID HIV SCREEN (HIV 1/2 AB+AG)  BRAIN NATRIURETIC PEPTIDE  I-STAT TROPONIN, ED  I-STAT CG4 LACTIC ACID, ED    EKG EKG Interpretation  Date/Time:  Monday November 14 2017 13:08:16 EDT Ventricular Rate:  82 PR Interval:    QRS Duration: 95 QT Interval:  357 QTC Calculation: 417 R Axis:   -53 Text Interpretation:  Sinus rhythm LAD, consider left anterior fascicular block Abnormal R-wave progression, early transition Confirmed by Quintella Reichert [phone]) on 11/14/2017 1:19:11 PM   Radiology Dg Chest 2 View  Result Date: 11/14/2017 CLINICAL DATA:  Productive cough, fever, and nausea for 3 days. EXAM: CHEST - 2 VIEW COMPARISON:  05/03/2015. FINDINGS: Normal heart size.  Clear lung fields.  No acute bony abnormality. IMPRESSION: No active cardiopulmonary disease. Electronically Signed   By: Staci Righter M.D.   On: 11/14/2017 13:36   Ct Head Wo Contrast  Result Date: 11/14/2017 CLINICAL DATA:  Diffuse headache for 2 days. EXAM: CT HEAD WITHOUT CONTRAST TECHNIQUE: Contiguous axial images were obtained from the base of the skull through the vertex without intravenous contrast. COMPARISON:  MR brain 05/11/2017. FINDINGS: Brain: No evidence for acute stroke, acute hemorrhage, hydrocephalus, or extra-axial fluid. Mild atrophy and small vessel disease, similar to priors. Soft tissue density LEFT sella and cavernous sinus, estimated 10 mm, consistent with residual tumor, but no visible suprasellar extension. Rightward  stalk insertion. Vascular: Calcification of the cavernous internal carotid arteries consistent with cerebrovascular atherosclerotic disease. Lateral  mass effect/deviation of the internal carotid artery is suspected. No signs of intracranial large vessel occlusion. Skull: Dehiscent floor of sella. Status post transsphenoidal resection. Sinuses/Orbits: Slight layering fluid RIGHT maxillary sinus. Chronic appearing mucosal thickening of the ethmoids. Hypoplastic frontal sinuses. Postsurgical change within the sphenoid. Other: None. IMPRESSION: Suspected residual/recurrent pituitary adenoma, up to 10 mm diameter, LEFT cavernous sinus. No visible acute intracranial abnormality. MR of the brain without and with contrast, using pituitary protocol, is the preferred method for assessment for pituitary lesions, and is recommended for further evaluation unless contraindications are present. Electronically Signed   By: Staci Righter M.D.   On: 11/14/2017 15:23    Procedures Procedures (including critical care time)  Medications Ordered in ED Medications  oseltamivir (TAMIFLU) capsule 30 mg (has no administration in time range)  acetaminophen (TYLENOL) tablet 650 mg (has no administration in time range)  benzonatate (TESSALON) capsule 100 mg (has no administration in time range)  sodium chloride 0.9 % bolus 500 mL (0 mLs Intravenous Stopped 11/14/17 1411)     Initial Impression / Assessment and Plan / ED Course  I have reviewed the triage vital signs and the nursing notes.  Pertinent labs & imaging results that were available during my care of the patient were reviewed by me and considered in my medical decision making (see chart for details).    Adriana Jordan presents today for evaluation of headache, cough, and generally not feeling well.  CT had obtained without acute abnormalities or cause for her symptoms.  Chest x-ray obtained without acute infiltrates or abnormalities.  Flu test positive for  influenza a and she was started on tamiflu.  She has a new oxygen requirement, on room air was in the high 80s with good waveform, given clear CXR suspect is related to flu.  Patient is neurologically intact on my exam.    CBC with white count 2.4, neutrophil count 1.0.  Rapid HIV screen obtained given presentation and neutropenia which was non reactive.  CK mildly elevated at 296.  GFR mildly decreased at 35 with Cr 1.38.  Patient questionably meets sirs criteria , however I do not have a bacterial source and suspect this is related to her influenza.  She was not hypotensive and lactic acid is under 4 so not given 30/kg fluid bolus.  Antibiotics held and she was treated with reduced dose tamiflu based on her kidney function.    Hospitalist was consulted for admission who agreed to admit based on new oxygen requirement.    Final Clinical Impressions(s) / ED Diagnoses   Final diagnoses:  Influenza A  Hypoxia    ED Discharge Orders    None       Ollen Gross 11/14/17 1752    Quintella Reichert, MD 11/14/17 2302

## 2017-11-14 NOTE — H&P (Signed)
History and Physical    Adriana Jordan:[id number] DOB: 20-Jun-1935 DOA: 11/14/2017   PCP: Patient, No Pcp Per   Patient coming from:  Home    Chief Complaint: SHortness of breath   HPI: Adriana Jordan is a 83 y.o. female with medical history significant for transsphenoidal resection of pituitary tumor in 2016, history of MI, CAD, GERD, retention, hyperlipidemia, presenting to urgent care today for evaluation of headache.  She also reported some subjective fever, and chills.  She has not been able to eat, drink or take her medications, due to feeling very weak.  At urgent care, she was hypotensive, tachycardic, and a T-max of 100.8.  She also was coughing yellow mucus.  The patient did not take Tylenol that time.  She was sent to the emergency department for further evaluation.  She denies any chest pain or palpitations.  She denies any lower extremity swelling or calf pain.  She is unaware of sick contacts.  Of note, she was seen at the ED for flank pain on 11/09/2017 for possible UTI.  She denies any symptoms at this time. On arrival, the patient continues to feel very weak, however her hypotension and tachycardia has resolved.  Influenza panel was drawn, and was positive for flu.  She was initiated on Tamiflu.  Being admitted for management of her initial symptoms.   ED Course:  BP 124/71   Pulse 78   Temp 100.3 F (37.9 C) (Rectal)   Resp 17   Ht 5\' 6"  (1.676 m)   Wt 71.2 kg (157 lb)   LMP  (LMP Unknown)   SpO2 97%   BMI 25.34 kg/m   As mentioned above, the patient has been given Tamiflu, and Tylenol for fever.  She reports feeling better at this time. Received new oxygen requirements, with 2 L of oxygen nasal cannula 21.38, GFR 35 White count 2.4 Trace leukocytes in urine She received Tamiflu, and Tessalon Perles.  She also received a bolus of 500 cc of IV fluids. Cultures are pending CT of the head without acute intracranial abnormality Chest x-ray negative for acute  infiltrates or abnormalities.  Review of Systems:  As per HPI otherwise all other systems reviewed and are negative  Past Medical History:  Diagnosis Date  . Arthritis   . Brain tumor ([place])   . Coronary artery disease    Dr. Doylene Canard  . GERD (gastroesophageal reflux disease)   . Hypercholesteremia   . MI (myocardial infarction) ([place]) 2006  . PONV (postoperative nausea and vomiting)     Past Surgical History:  Procedure Laterality Date  . BACK SURGERY    . CERVICAL SPINE SURGERY    . CORONARY ANGIOPLASTY     DES mid LAD and mid RCA 09/21/04  . CRANIOTOMY N/A 05/21/2015   Procedure: Transsphenoidal resection of pituitary tumor with Dr. Radene Journey for approach;  Surgeon: Leeroy Cha, MD;  Location: [facility] NEURO ORS;  Service: Neurosurgery;  Laterality: N/A;  Transsphenoidal resection of pituitary tumor with Dr. Radene Journey for approach  . ROTATOR CUFF REPAIR    . thumb surgery Right    placed srews to make straight    Social History Social History   Socioeconomic History  . Marital status: Divorced    Spouse name: Not on file  . Number of children: Not on file  . Years of education: Not on file  . Highest education level: Not on file  Occupational History  . Not on file  Social Needs  .  Financial resource strain: Not on file  . Food insecurity:    Worry: Not on file    Inability: Not on file  . Transportation needs:    Medical: Not on file    Non-medical: Not on file  Tobacco Use  . Smoking status: Former Smoker    Last attempt to quit: 04/26/1969    Years since quitting: 48.5  . Smokeless tobacco: Never Used  Substance and Sexual Activity  . Alcohol use: No    Comment: WINE- OCC  . Drug use: No  . Sexual activity: Never    Comment: 1ST intercourse- 19, partners- 5,   Lifestyle  . Physical activity:    Days per week: Not on file    Minutes per session: Not on file  . Stress: Not on file  Relationships  . Social connections:    Talks on phone: Not on file     Gets together: Not on file    Attends religious service: Not on file    Active member of club or organization: Not on file    Attends meetings of clubs or organizations: Not on file    Relationship status: Not on file  . Intimate partner violence:    Fear of current or ex partner: Not on file    Emotionally abused: Not on file    Physically abused: Not on file    Forced sexual activity: Not on file  Other Topics Concern  . Not on file  Social History Narrative  . Not on file     No Known Allergies  Family History  Problem Relation Age of Onset  . Cancer Father 60       throat- smoker   . Breast cancer Sister 44  . Cancer Brother 43       lung- smoker      Prior to Admission medications   Medication Sig Start Date End Date Taking? Authorizing Provider  Artificial Tear Ointment (DRY EYES OP) Place 2 drops into both eyes daily as needed.   Yes [provider]  aspirin 81 MG tablet Take 81 mg by mouth daily.   Yes [provider]  HYDROcodone-acetaminophen (NORCO/VICODIN) 5-325 MG tablet Take 1-2 tablets by mouth every 6 (six) hours as needed. 11/09/17  Yes Volanda Napoleon, PA-C  losartan-hydrochlorothiazide (HYZAAR) 100-12.5 MG tablet Take 1 tablet by mouth daily. 08/11/17  Yes [provider]  Multiple Vitamin (MULTIVITAMIN) tablet Take 1 tablet by mouth daily. Women   Yes [provider]  omeprazole (PRILOSEC) 20 MG capsule Take 20 mg by mouth daily.   Yes [provider]  simvastatin (ZOCOR) 40 MG tablet Take 40 mg by mouth daily.   Yes [provider]    Physical Exam:  Vitals:   11/14/17 1400 11/14/17 1500 11/14/17 1600 11/14/17 1645  BP: 123/72 120/62 124/71   Pulse: 85 84 79 78  Resp: (!) 9 13 20 17   Temp:      TempSrc:      SpO2: 95% 97% 93% 97%  Weight:      Height:       Constitutional: NAD, calm,ill appearing, thin  Eyes: PERRL, lids and conjunctivae normal ENMT: Mucous membranes are moist, without  exudate or lesions  Neck: normal, supple, no masses, no thyromegaly Respiratory: clear to auscultation bilaterally, no wheezing, no crackles. Normal respiratory effort  Cardiovascular: Regular rate and rhythm, 2/6  murmur, rubs or gallops. No extremity edema. 2+ pedal pulses. No carotid bruits.  Abdomen:  Soft, non tender, No hepatosplenomegaly. Bowel sounds positive.  Musculoskeletal: no clubbing / cyanosis. Moves all extremities Skin: no jaundice, No lesions.  Neurologic: Sensation intact  Strength equal in all extremities Psychiatric:   Alert and oriented x 3. Normal mood.     Labs on Admission: I have personally reviewed following labs and imaging studies  CBC: Recent Labs  Lab 11/09/17 1341 11/14/17 1250  WBC 3.5* 2.4*  NEUTROABS  --  1.0*  HGB 11.8* 12.1  HCT 36.0 36.2  MCV 93.0 92.8  PLT 306 161    Basic Metabolic Panel: Recent Labs  Lab 11/09/17 1341 11/14/17 1250  NA 137 133*  K 3.8 3.6  CL 104 96*  CO2 23 22  GLUCOSE 105* 95  BUN 18 19  CREATININE 1.06* 1.38*  CALCIUM 9.6 9.2    GFR: Estimated Creatinine Clearance: 32.4 mL/min (A) (by C-G formula based on SCr of 1.38 mg/dL (H)).  Liver Function Tests: Recent Labs  Lab 11/09/17 1341 11/14/17 1250  AST 36 43*  ALT 20 22  ALKPHOS 60 61  BILITOT 0.6 0.6  PROT 7.2 7.2  ALBUMIN 4.1 3.7   Recent Labs  Lab 11/09/17 1341  LIPASE 47   No results for input(s): AMMONIA in the last 168 hours.  Coagulation Profile: Recent Labs  Lab 11/14/17 1250  INR 0.90    Cardiac Enzymes: Recent Labs  Lab 11/14/17 1250  CKTOTAL 296*    BNP (last 3 results) No results for input(s): PROBNP in the last 8760 hours.  HbA1C: No results for input(s): HGBA1C in the last 72 hours.  CBG: No results for input(s): GLUCAP in the last 168 hours.  Lipid Profile: No results for input(s): CHOL, HDL, LDLCALC, TRIG, CHOLHDL, LDLDIRECT in the last 72 hours.  Thyroid Function Tests: No results for input(s): TSH,  T4TOTAL, FREET4, T3FREE, THYROIDAB in the last 72 hours.  Anemia Panel: No results for input(s): VITAMINB12, FOLATE, FERRITIN, TIBC, IRON, RETICCTPCT in the last 72 hours.  Urine analysis:    Component Value Date/Time   COLORURINE YELLOW 11/14/2017 1424   APPEARANCEUR CLEAR 11/14/2017 1424   LABSPEC 1.013 11/14/2017 1424   PHURINE 5.0 11/14/2017 1424   GLUCOSEU NEGATIVE 11/14/2017 1424   HGBUR NEGATIVE 11/14/2017 1424   BILIRUBINUR NEGATIVE 11/14/2017 1424   KETONESUR 5 (A) 11/14/2017 1424   PROTEINUR NEGATIVE 11/14/2017 1424   UROBILINOGEN 0.2 03/01/2015 2045   NITRITE NEGATIVE 11/14/2017 1424   LEUKOCYTESUR TRACE (A) 11/14/2017 1424    Sepsis Labs: @LABRCNTIP (procalcitonin:4,lacticidven:4) )No results found for this or any previous visit (from the past 240 hour(s)).   Radiological Exams on Admission: Dg Chest 2 View  Result Date: 11/14/2017 CLINICAL DATA:  Productive cough, fever, and nausea for 3 days. EXAM: CHEST - 2 VIEW COMPARISON:  05/03/2015. FINDINGS: Normal heart size.  Clear lung fields.  No acute bony abnormality. IMPRESSION: No active cardiopulmonary disease. Electronically Signed   By: Staci Righter M.D.   On: 11/14/2017 13:36   Ct Head Wo Contrast  Result Date: 11/14/2017 CLINICAL DATA:  Diffuse headache for 2 days. EXAM: CT HEAD WITHOUT CONTRAST TECHNIQUE: Contiguous axial images were obtained from the base of the skull through the vertex without intravenous contrast. COMPARISON:  MR brain 05/11/2017. FINDINGS: Brain: No evidence for acute stroke, acute hemorrhage, hydrocephalus, or extra-axial fluid. Mild atrophy and small vessel disease, similar to priors. Soft tissue density LEFT sella and cavernous sinus, estimated 10 mm, consistent with residual tumor, but no visible suprasellar extension. Rightward stalk insertion.  Vascular: Calcification of the cavernous internal carotid arteries consistent with cerebrovascular atherosclerotic disease. Lateral mass  effect/deviation of the internal carotid artery is suspected. No signs of intracranial large vessel occlusion. Skull: Dehiscent floor of sella. Status post transsphenoidal resection. Sinuses/Orbits: Slight layering fluid RIGHT maxillary sinus. Chronic appearing mucosal thickening of the ethmoids. Hypoplastic frontal sinuses. Postsurgical change within the sphenoid. Other: None. IMPRESSION: Suspected residual/recurrent pituitary adenoma, up to 10 mm diameter, LEFT cavernous sinus. No visible acute intracranial abnormality. MR of the brain without and with contrast, using pituitary protocol, is the preferred method for assessment for pituitary lesions, and is recommended for further evaluation unless contraindications are present. Electronically Signed   By: Staci Righter M.D.   On: 11/14/2017 15:23    EKG: Independently reviewed.  Assessment/Plan Principal Problem:   Flu Active Problems:   Pituitary adenoma ([place])   Hypertension   Hyperlipidemia   GERD (gastroesophageal reflux disease)   Acute Respiratory Failure with Hypoxia  Likely due to + FLu initial O2 sats 88%, corrected with 2 L of oxygen. Tmax now 100.3  Continue influenza with Tamiflu Continue to change supplement until her symptoms improve Incentive spirometry Tylenol for fever   GERD, no acute symptoms Continue PPI  Hyperlipidemia Continue home statins   Hypertension BP 124/71   Continue home anti-hypertensive medications in am       Acute on Chronic CKD: likely due to dehydration and ACE I or AR, diuretics  Cr 1.38, normal at 0.91 Lab Results  Component Value Date   CREATININE 1.38 (H) 11/14/2017   CREATININE 1.06 (H) 11/09/2017   CREATININE 0.91 05/22/2015   Hold diuretics and ACE I, and NSAIDS Gentle IVF      DVT prophylaxis: Hep sq  Code Status:    Full  Family Communication:  Discussed with patient Disposition Plan: Expect patient to be discharged to home after condition improves Consults called:     None Admission status: MEdsurg Obs   Sharene Butters, PA-C [facility]   Amion text  [id number]   11/14/2017, 5:21 PM

## 2017-11-14 NOTE — ED Triage Notes (Signed)
Per EMS: Pt went to [facility] today for HA. It has progressively gotten worse since Saturday, x 2 days. Pt potentially has Pneumonia, Rhonchi heard in Lower lungs. Pt has Hx of Brain tumor Removal 2 years ago. Pt has cough with yellow sputum. Pt was 88% on RA and placed on 2L O2 SPO2 now 95%. No neuro deficits. A&Ox4 128/80 NSR, No N/V, Pt has Photophobia.

## 2017-11-15 DIAGNOSIS — K219 Gastro-esophageal reflux disease without esophagitis: Secondary | ICD-10-CM | POA: Diagnosis not present

## 2017-11-15 DIAGNOSIS — I10 Essential (primary) hypertension: Secondary | ICD-10-CM | POA: Diagnosis not present

## 2017-11-15 DIAGNOSIS — J111 Influenza due to unidentified influenza virus with other respiratory manifestations: Secondary | ICD-10-CM | POA: Diagnosis not present

## 2017-11-15 DIAGNOSIS — R748 Abnormal levels of other serum enzymes: Secondary | ICD-10-CM | POA: Diagnosis present

## 2017-11-15 DIAGNOSIS — J9601 Acute respiratory failure with hypoxia: Secondary | ICD-10-CM | POA: Diagnosis not present

## 2017-11-15 DIAGNOSIS — N179 Acute kidney failure, unspecified: Secondary | ICD-10-CM

## 2017-11-15 DIAGNOSIS — D72819 Decreased white blood cell count, unspecified: Secondary | ICD-10-CM | POA: Diagnosis present

## 2017-11-15 DIAGNOSIS — D352 Benign neoplasm of pituitary gland: Secondary | ICD-10-CM | POA: Diagnosis not present

## 2017-11-15 DIAGNOSIS — E785 Hyperlipidemia, unspecified: Secondary | ICD-10-CM | POA: Diagnosis not present

## 2017-11-15 HISTORY — DX: Abnormal levels of other serum enzymes: R74.8

## 2017-11-15 HISTORY — DX: Acute respiratory failure with hypoxia: J96.01

## 2017-11-15 HISTORY — DX: Acute kidney failure, unspecified: N17.9

## 2017-11-15 LAB — BASIC METABOLIC PANEL
ANION GAP: 10 (ref 5–15)
BUN: 21 mg/dL — ABNORMAL HIGH (ref 6–20)
CHLORIDE: 103 mmol/L (ref 101–111)
CO2: 22 mmol/L (ref 22–32)
Calcium: 8.7 mg/dL — ABNORMAL LOW (ref 8.9–10.3)
Creatinine, Ser: 1.23 mg/dL — ABNORMAL HIGH (ref 0.44–1.00)
GFR calc non Af Amer: 40 mL/min — ABNORMAL LOW (ref >60)
GFR, EST AFRICAN AMERICAN: 46 mL/min — AB (ref >60)
Glucose, Bld: 90 mg/dL (ref 65–99)
POTASSIUM: 3.4 mmol/L — AB (ref 3.5–5.1)
SODIUM: 135 mmol/L (ref 135–145)

## 2017-11-15 LAB — URINE CULTURE

## 2017-11-15 LAB — CBC
HEMATOCRIT: 33.1 % — AB (ref 36.0–46.0)
HEMOGLOBIN: 11 g/dL — AB (ref 12.0–15.0)
MCH: 30.8 pg (ref 26.0–34.0)
MCHC: 33.2 g/dL (ref 30.0–36.0)
MCV: 92.7 fL (ref 78.0–100.0)
Platelets: 240 10*3/uL (ref 150–400)
RBC: 3.57 MIL/uL — ABNORMAL LOW (ref 3.87–5.11)
RDW: 13.3 % (ref 11.5–15.5)
WBC: 1.8 10*3/uL — AB (ref 4.0–10.5)

## 2017-11-15 LAB — CK
Total CK: 324 U/L — ABNORMAL HIGH (ref 38–234)
Total CK: 323 U/L — ABNORMAL HIGH (ref 38–234)

## 2017-11-15 MED ORDER — SODIUM CHLORIDE 0.9 % IV SOLN
INTRAVENOUS | Status: DC
  Administered 2017-11-15: 1 mL via INTRAVENOUS
  Administered 2017-11-16: 06:00:00 via INTRAVENOUS

## 2017-11-15 MED ORDER — BENZONATATE 100 MG PO CAPS
200.0000 mg | ORAL_CAPSULE | Freq: Two times a day (BID) | ORAL | Status: DC
  Administered 2017-11-15 – 2017-11-16 (×2): 200 mg via ORAL
  Filled 2017-11-15 (×2): qty 2

## 2017-11-15 MED ORDER — DEXTROMETHORPHAN POLISTIREX ER 30 MG/5ML PO SUER
30.0000 mg | Freq: Two times a day (BID) | ORAL | Status: DC
  Administered 2017-11-15 – 2017-11-16 (×2): 30 mg via ORAL
  Filled 2017-11-15 (×2): qty 5

## 2017-11-15 NOTE — Progress Notes (Signed)
PROGRESS NOTE  Adriana Jordan TIW:[id number] DOB: Apr 05, 1935 DOA: 11/14/2017 PCP: Dixie Dials, MD  HPI/Recap of past 24 hours:  Adriana Jordan is a 83 y.o. year old female with medical history significant for pituitary tumor s/p transsphenoidal resection 2016, CAD, hypertension, GERD, hyperlipidemia who presented on 11/14/2017 with headache, fevers, chills, cough, sore throat and was found to have acute hypoxic respiratory failure due to influenza A.    Subjective Feeling much better this morning.  Reports improvement in her cough. On further history patient denies any falls. Denies any fevers overnight, improving  Assessment/Plan: Principal Problem:   Flu Active Problems:   Pituitary adenoma (HCC)   Hypertension   Hyperlipidemia   GERD (gastroesophageal reflux disease)   #Acute hypoxic respiratory failure due to influenza A -Tamiflu x 5 days, wean oxygen, goal SPO2 greater than 92% - walking oxygen test once on room air - Supportive care: Incentive Spirometer, Tylenol PRN, tessalon pearls  #Leukopenia, worsening - in setting of flu virus -WBC trend: 2.4--1.8 (today) - not neutropenic - monitoring blood cultures on admission - monitor on daily CBC, further investigation if no improvement with supportive care  #AKI, likely pre-renal, improving -decreased PO intake with flu - baseline creatinine 0.9-1, Cr trend: 1.38--1.23(today) -improving with IVF, timed over next 24 hours, encourage PO intake  #Hypokalemia  #Elevated CK, improving - rhabdomyolysis, no history of fall -?being in bed - Trend 296--324--323(this am), now downtrending - continue IVF  #CAD, stable Asymptomatic -continue home aspirin  #H/o pituitary tumor s/p transphenoidal resection - CT head: residual 1 cm diameter adenoma in left sella, w/o suprasellar extension - Stressed importance of neurosurgery follow up for surveillance ( last done in 05/2017 per patient) - she states she gets yearly  scans  #HTN, stable - well controlled off BP meds currently - home losartan/HCTZ holding in setting of AKI  #HLD - home simvastatin  #GERD - protonix ( home prilosec)   Code Status: Full Code   Family Communication: no family at bedside    Disposition Plan: stabilization of WBC (quite leukopenic and won't to make sure doesn't become neutropenic), need walking oxygen test, monitor for continued improvement in creatinine, expect discharge in 24 hours.     Consultants:  None  Procedures:  None  Antimicrobials: None  Cultures:  Blood cultures ( 4/8): < 24 hours  Telemetry: NO  DVT prophylaxis: heparin   Objective: Vitals:   11/14/17 1800 11/14/17 2033 11/15/17 0500 11/15/17 0533  BP: 133/63 91/64  117/64  Pulse: 79 92  85  Resp: 14 19  16   Temp: 98.8 F (37.1 C) 98.5 F (36.9 C)  99 F (37.2 C)  TempSrc: Oral Oral  Oral  SpO2: 94% 95%  96%  Weight:   68.9 kg (151 lb 14.4 oz)   Height:        Intake/Output Summary (Last 24 hours) at 11/15/2017 0843 Last data filed at 11/15/2017 0532 Gross per 24 hour  Intake 1578.75 ml  Output -  Net 1578.75 ml   Filed Weights   11/14/17 1206 11/15/17 0500  Weight: 71.2 kg (157 lb) 68.9 kg (151 lb 14.4 oz)    Exam:  Constitutional:elderly, frail appearing female Eyes: EOMI, anicteric, normal conjunctivae ENMT: Oropharynx with moist mucous membranes, normal dentition Neck: FROM Cardiovascular: RRR no MRGs, with no peripheral edema Respiratory: Normal respiratory effort on 2 L [place], clear breath sounds with no wheezing or crackles Abdomen: Soft,non-tender, with no HSM Skin: No rash ulcers, or lesions. Without  skin tenting  Neurologic: Grossly no focal neuro deficit. Psychiatric:Appropriate affect, and mood. Mental status AAOx4 Data Reviewed: CBC: Recent Labs  Lab 11/09/17 1341 11/14/17 1250 11/15/17 0439  WBC 3.5* 2.4* 1.8*  NEUTROABS  --  1.0*  --   HGB 11.8* 12.1 11.0*  HCT 36.0 36.2 33.1*  MCV 93.0  92.8 92.7  PLT 306 256 660   Basic Metabolic Panel: Recent Labs  Lab 11/09/17 1341 11/14/17 1250 11/15/17 0439  NA 137 133* 135  K 3.8 3.6 3.4*  CL 104 96* 103  CO2 23 22 22   GLUCOSE 105* 95 90  BUN 18 19 21*  CREATININE 1.06* 1.38* 1.23*  CALCIUM 9.6 9.2 8.7*   GFR: Estimated Creatinine Clearance: 33.6 mL/min (A) (by C-G formula based on SCr of 1.23 mg/dL (H)). Liver Function Tests: Recent Labs  Lab 11/09/17 1341 11/14/17 1250  AST 36 43*  ALT 20 22  ALKPHOS 60 61  BILITOT 0.6 0.6  PROT 7.2 7.2  ALBUMIN 4.1 3.7   Recent Labs  Lab 11/09/17 1341  LIPASE 47   No results for input(s): AMMONIA in the last 168 hours. Coagulation Profile: Recent Labs  Lab 11/14/17 1250  INR 0.90   Cardiac Enzymes: Recent Labs  Lab 11/14/17 1250 11/15/17 0439  CKTOTAL 296* 324*   BNP (last 3 results) No results for input(s): PROBNP in the last 8760 hours. HbA1C: No results for input(s): HGBA1C in the last 72 hours. CBG: No results for input(s): GLUCAP in the last 168 hours. Lipid Profile: No results for input(s): CHOL, HDL, LDLCALC, TRIG, CHOLHDL, LDLDIRECT in the last 72 hours. Thyroid Function Tests: No results for input(s): TSH, T4TOTAL, FREET4, T3FREE, THYROIDAB in the last 72 hours. Anemia Panel: No results for input(s): VITAMINB12, FOLATE, FERRITIN, TIBC, IRON, RETICCTPCT in the last 72 hours. Urine analysis:    Component Value Date/Time   COLORURINE YELLOW 11/14/2017 1424   APPEARANCEUR CLEAR 11/14/2017 1424   LABSPEC 1.013 11/14/2017 1424   PHURINE 5.0 11/14/2017 1424   GLUCOSEU NEGATIVE 11/14/2017 1424   HGBUR NEGATIVE 11/14/2017 1424   BILIRUBINUR NEGATIVE 11/14/2017 1424   KETONESUR 5 (A) 11/14/2017 1424   PROTEINUR NEGATIVE 11/14/2017 1424   UROBILINOGEN 0.2 03/01/2015 2045   NITRITE NEGATIVE 11/14/2017 1424   LEUKOCYTESUR TRACE (A) 11/14/2017 1424   Sepsis Labs: @LABRCNTIP (procalcitonin:4,lacticidven:4)  )No results found for this or any previous  visit (from the past 240 hour(s)).    Studies: Dg Chest 2 View  Result Date: 11/14/2017 CLINICAL DATA:  Productive cough, fever, and nausea for 3 days. EXAM: CHEST - 2 VIEW COMPARISON:  05/03/2015. FINDINGS: Normal heart size.  Clear lung fields.  No acute bony abnormality. IMPRESSION: No active cardiopulmonary disease. Electronically Signed   By: Staci Righter M.D.   On: 11/14/2017 13:36   Ct Head Wo Contrast  Result Date: 11/14/2017 CLINICAL DATA:  Diffuse headache for 2 days. EXAM: CT HEAD WITHOUT CONTRAST TECHNIQUE: Contiguous axial images were obtained from the base of the skull through the vertex without intravenous contrast. COMPARISON:  MR brain 05/11/2017. FINDINGS: Brain: No evidence for acute stroke, acute hemorrhage, hydrocephalus, or extra-axial fluid. Mild atrophy and small vessel disease, similar to priors. Soft tissue density LEFT sella and cavernous sinus, estimated 10 mm, consistent with residual tumor, but no visible suprasellar extension. Rightward stalk insertion. Vascular: Calcification of the cavernous internal carotid arteries consistent with cerebrovascular atherosclerotic disease. Lateral mass effect/deviation of the internal carotid artery is suspected. No signs of intracranial large vessel occlusion. Skull:  Dehiscent floor of sella. Status post transsphenoidal resection. Sinuses/Orbits: Slight layering fluid RIGHT maxillary sinus. Chronic appearing mucosal thickening of the ethmoids. Hypoplastic frontal sinuses. Postsurgical change within the sphenoid. Other: None. IMPRESSION: Suspected residual/recurrent pituitary adenoma, up to 10 mm diameter, LEFT cavernous sinus. No visible acute intracranial abnormality. MR of the brain without and with contrast, using pituitary protocol, is the preferred method for assessment for pituitary lesions, and is recommended for further evaluation unless contraindications are present. Electronically Signed   By: Staci Righter M.D.   On: 11/14/2017  15:23    Scheduled Meds: . heparin  5,000 Units Subcutaneous Q8H  . oseltamivir  30 mg Oral BID  . pantoprazole  40 mg Oral Daily  . simvastatin  40 mg Oral Daily    Continuous Infusions: . sodium chloride 75 mL/hr at 11/15/17 0532     LOS: 0 days     Desiree Hane, MD [facility] Pager [phone]  If 7PM-7AM, please contact night-coverage [email or web address] Password [facility] 11/15/2017, 8:43 AM

## 2017-11-16 DIAGNOSIS — J101 Influenza due to other identified influenza virus with other respiratory manifestations: Secondary | ICD-10-CM | POA: Diagnosis not present

## 2017-11-16 DIAGNOSIS — N179 Acute kidney failure, unspecified: Secondary | ICD-10-CM

## 2017-11-16 MED ORDER — OSELTAMIVIR PHOSPHATE 30 MG PO CAPS: 30.0000 mg | ORAL_CAPSULE | Freq: Two times a day (BID) | ORAL | 0 refills | Status: AC

## 2017-11-16 MED ORDER — LOSARTAN POTASSIUM-HCTZ 100-12.5 MG PO TABS: 1.0000 | ORAL_TABLET | Freq: Every day | ORAL | 3 refills | Status: DC

## 2017-11-16 NOTE — Progress Notes (Signed)
Please see in addition to PT note:   SATURATION QUALIFICATIONS: (This note is used to comply with regulatory documentation for home oxygen)  Patient Saturations on Room Air at Rest = 97%  Patient Saturations on Room Air while Ambulating = 90-97%  Patient Saturations on 2 Liters of oxygen while Ambulating = DNT, patient did not require supplemental O2 during gait period   Please briefly explain why patient needs home oxygen:patient not in need of home O2, remains between 90-97% during gait period on room air   Deniece Ree PT, DPT, [place]   Pager [phone]

## 2017-11-16 NOTE — Discharge Summary (Signed)
Physician Discharge Summary  Adriana Jordan MRN: [id number] DOB/AGE: 1935-05-20 83 y.o.  PCP: Dixie Dials, MD   Admit date: 11/14/2017 Discharge date: 11/16/2017  Discharge Diagnoses:    Principal Problem:   Influenza A Active Problems:   Pituitary adenoma ([place])   Hypertension   Hyperlipidemia   GERD (gastroesophageal reflux disease)   Leukopenia   Acute respiratory failure with hypoxia (HCC)   AKI (acute kidney injury) ([place])   Elevated CK    Follow-up recommendations Follow-up with PCP in 3-5 days , including all  additional recommended appointments as below Follow-up CBC, CMP in 3-5 days Advised to hold Hyzaar for a week pending improvement in by mouth intake       Allergies as of 11/16/2017   No Known Allergies     Medication List    TAKE these medications   aspirin 81 MG tablet Take 81 mg by mouth daily.   DRY EYES OP Place 2 drops into both eyes daily as needed.   HYDROcodone-acetaminophen 5-325 MG tablet Commonly known as:  NORCO/VICODIN Take 1-2 tablets by mouth every 6 (six) hours as needed.   losartan-hydrochlorothiazide 100-12.5 MG tablet Commonly known as:  HYZAAR Take 1 tablet by mouth daily. Start taking on:  11/23/2017 What changed:  These instructions start on 11/23/2017. If you are unsure what to do until then, ask your doctor or other care provider.   multivitamin tablet Take 1 tablet by mouth daily. Women   omeprazole 20 MG capsule Commonly known as:  PRILOSEC Take 20 mg by mouth daily.   oseltamivir 30 MG capsule Commonly known as:  TAMIFLU Take 1 capsule (30 mg total) by mouth 2 (two) times daily for 4 days.   simvastatin 40 MG tablet Commonly known as:  ZOCOR Take 40 mg by mouth daily.        Discharge Condition: *   Discharge Instructions Get Medicines reviewed and adjusted: Please take all your medications with you for your next visit with your Primary MD  Please request your Primary MD to go over all hospital  tests and procedure/radiological results at the follow up, please ask your Primary MD to get all Hospital records sent to his/her office.  If you experience worsening of your admission symptoms, develop shortness of breath, life threatening emergency, suicidal or homicidal thoughts you must seek medical attention immediately by calling 911 or calling your MD immediately if symptoms less severe.  You must read complete instructions/literature along with all the possible adverse reactions/side effects for all the Medicines you take and that have been prescribed to you. Take any new Medicines after you have completely understood and accpet all the possible adverse reactions/side effects.   Do not drive when taking Pain medications.   Do not take more than prescribed Pain, Sleep and Anxiety Medications  Special Instructions: If you have smoked or chewed Tobacco in the last 2 yrs please stop smoking, stop any regular Alcohol and or any Recreational drug use.  Wear Seat belts while driving.  Please note  You were cared for by a hospitalist during your hospital stay. Once you are discharged, your primary care physician will handle any further medical issues. Please note that NO REFILLS for any discharge medications will be authorized once you are discharged, as it is imperative that you return to your primary care physician (or establish a relationship with a primary care physician if you do not have one) for your aftercare needs so that they can reassess your need for  medications and monitor your lab values.     No Known Allergies    Disposition: Discharge disposition: 01-Home or Self Care        Consults:      Significant Diagnostic Studies:  Dg Chest 2 View  Result Date: 11/14/2017 CLINICAL DATA:  Productive cough, fever, and nausea for 3 days. EXAM: CHEST - 2 VIEW COMPARISON:  05/03/2015. FINDINGS: Normal heart size.  Clear lung fields.  No acute bony abnormality. IMPRESSION:  No active cardiopulmonary disease. Electronically Signed   By: Staci Righter M.D.   On: 11/14/2017 13:36   Ct Head Wo Contrast  Result Date: 11/14/2017 CLINICAL DATA:  Diffuse headache for 2 days. EXAM: CT HEAD WITHOUT CONTRAST TECHNIQUE: Contiguous axial images were obtained from the base of the skull through the vertex without intravenous contrast. COMPARISON:  MR brain 05/11/2017. FINDINGS: Brain: No evidence for acute stroke, acute hemorrhage, hydrocephalus, or extra-axial fluid. Mild atrophy and small vessel disease, similar to priors. Soft tissue density LEFT sella and cavernous sinus, estimated 10 mm, consistent with residual tumor, but no visible suprasellar extension. Rightward stalk insertion. Vascular: Calcification of the cavernous internal carotid arteries consistent with cerebrovascular atherosclerotic disease. Lateral mass effect/deviation of the internal carotid artery is suspected. No signs of intracranial large vessel occlusion. Skull: Dehiscent floor of sella. Status post transsphenoidal resection. Sinuses/Orbits: Slight layering fluid RIGHT maxillary sinus. Chronic appearing mucosal thickening of the ethmoids. Hypoplastic frontal sinuses. Postsurgical change within the sphenoid. Other: None. IMPRESSION: Suspected residual/recurrent pituitary adenoma, up to 10 mm diameter, LEFT cavernous sinus. No visible acute intracranial abnormality. MR of the brain without and with contrast, using pituitary protocol, is the preferred method for assessment for pituitary lesions, and is recommended for further evaluation unless contraindications are present. Electronically Signed   By: Staci Righter M.D.   On: 11/14/2017 15:23   Ct Renal Stone Study  Result Date: 11/09/2017 CLINICAL DATA:  Left flank pain for 3 days. Sharp and intermittent pain. EXAM: CT ABDOMEN AND PELVIS WITHOUT CONTRAST TECHNIQUE: Multidetector CT imaging of the abdomen and pelvis was performed following the standard protocol without IV  contrast. COMPARISON:  12/23/2005 FINDINGS: Lower chest: Focal scarring in the right lung base. Coronary artery calcifications. Hepatobiliary: No focal liver abnormality is seen. No gallstones, gallbladder wall thickening, or biliary dilatation. Pancreas: Unremarkable. No pancreatic ductal dilatation or surrounding inflammatory changes. Spleen: Normal in size without focal abnormality. Adrenals/Urinary Tract: Adrenal glands are unremarkable. Kidneys are normal, without renal calculi, focal lesion, or hydronephrosis. Bladder is unremarkable. Stomach/Bowel: Stomach is within normal limits. Appendix is not identified. No evidence of bowel wall thickening, distention, or inflammatory changes. Vascular/Lymphatic: Aortic atherosclerosis. No enlarged abdominal or pelvic lymph nodes. Flat IVC may indicate hypovolemia. Reproductive: Uterus and bilateral adnexa are unremarkable. Other: Small periumbilical hernia containing fat. No free air or free fluid. Musculoskeletal: Fatty atrophy of the gluteal musculature. Degenerative changes in the spine. Calcifications in the subcutaneous fat over the gluteal regions likely representing injection granulomas. IMPRESSION: 1. No renal or ureteral stone or obstructions. 2. No bowel obstruction or inflammation. 3. Small periumbilical hernia containing fat. 4. Aortic atherosclerosis. Electronically Signed   By: Lucienne Capers M.D.   On: 11/09/2017 21:14        Filed Weights   11/14/17 1206 11/15/17 0500 11/16/17 0500  Weight: 71.2 kg (157 lb) 68.9 kg (151 lb 14.4 oz) 69.3 kg (152 lb 12.5 oz)     Microbiology: Recent Results (from the past 240 hour(s))  Culture, blood (routine  x 2)     Status: None (Preliminary result)   Collection Time: 11/14/17 12:50 PM  Result Value Ref Range Status   Specimen Description BLOOD LEFT ANTECUBITAL  Final   Special Requests   Final    BOTTLES DRAWN AEROBIC AND ANAEROBIC Blood Culture results may not be optimal due to an excessive volume  of blood received in culture bottles   Culture   Final    NO GROWTH < 24 HOURS Performed at [facility] [address] [place], [place] [address]    Report Status PENDING  Incomplete  Culture, blood (routine x 2)     Status: None (Preliminary result)   Collection Time: 11/14/17  1:00 PM  Result Value Ref Range Status   Specimen Description BLOOD RIGHT HAND  Final   Special Requests   Final    BOTTLES DRAWN AEROBIC AND ANAEROBIC Blood Culture adequate volume   Culture   Final    NO GROWTH < 24 HOURS Performed at [facility], [place] [address] [place], [place] [address]    Report Status PENDING  Incomplete  Urine culture     Status: Abnormal   Collection Time: 11/14/17  2:24 PM  Result Value Ref Range Status   Specimen Description URINE, CLEAN CATCH  Final   Special Requests   Final    NONE Performed at [facility], [place] [address], [place], [place] [address]    Culture MULTIPLE SPECIES PRESENT, SUGGEST RECOLLECTION (A)  Final   Report Status 11/15/2017 FINAL  Final       Blood Culture    Component Value Date/Time   SDES URINE, CLEAN CATCH 11/14/2017 1424   SPECREQUEST  11/14/2017 1424    NONE Performed at [facility], [place] [address] [place], [place] [address]    CULT MULTIPLE SPECIES PRESENT, SUGGEST RECOLLECTION (A) 11/14/2017 1424   REPTSTATUS 11/15/2017 FINAL 11/14/2017 1424      Labs: Results for orders placed or performed during the hospital encounter of 11/14/17 (from the past 48 hour(s))  Comprehensive metabolic panel     Status: Abnormal   Collection Time: 11/14/17 12:50 PM  Result Value Ref Range   Sodium 133 (L) 135 - 145 mmol/L   Potassium 3.6 3.5 - 5.1 mmol/L   Chloride 96 (L) 101 - 111 mmol/L   CO2 22 22 - 32 mmol/L   Glucose, Bld 95 65 - 99 mg/dL   BUN 19 6 - 20 mg/dL   Creatinine, Ser 1.38 (H) 0.44 - 1.00 mg/dL   Calcium 9.2 8.9 - 10.3 mg/dL   Total Protein 7.2 6.5 - 8.1 g/dL   Albumin 3.7 3.5 - 5.0 g/dL   AST 43  (H) 15 - 41 U/L   ALT 22 14 - 54 U/L   Alkaline Phosphatase 61 38 - 126 U/L   Total Bilirubin 0.6 0.3 - 1.2 mg/dL   GFR calc non Af Amer 35 (L) >60 mL/min   GFR calc Af Amer 40 (L) >60 mL/min    Comment: (NOTE) The eGFR has been calculated using the CKD EPI equation. This calculation has not been validated in all clinical situations. eGFR's persistently <60 mL/min signify possible Chronic Kidney Disease.    Anion gap 15 5 - 15    Comment: Performed at [facility] [address] [place], [place] [address]  CBC with Differential     Status: Abnormal   Collection Time: 11/14/17 12:50 PM  Result Value  Ref Range   WBC 2.4 (L) 4.0 - 10.5 K/uL   RBC 3.90 3.87 - 5.11 MIL/uL   Hemoglobin 12.1 12.0 - 15.0 g/dL   HCT 36.2 36.0 - 46.0 %   MCV 92.8 78.0 - 100.0 fL   MCH 31.0 26.0 - 34.0 pg   MCHC 33.4 30.0 - 36.0 g/dL   RDW 13.4 11.5 - 15.5 %   Platelets 256 150 - 400 K/uL   Neutrophils Relative % 44 %   Neutro Abs 1.0 (L) 1.7 - 7.7 K/uL   Lymphocytes Relative 35 %   Lymphs Abs 0.8 0.7 - 4.0 K/uL   Monocytes Relative 19 %   Monocytes Absolute 0.5 0.1 - 1.0 K/uL   Eosinophils Relative 1 %   Eosinophils Absolute 0.0 0.0 - 0.7 K/uL   Basophils Relative 1 %   Basophils Absolute 0.0 0.0 - 0.1 K/uL    Comment: Performed at [facility] [address], [place], [place] [address]  Influenza panel by PCR (type A & B)     Status: Abnormal   Collection Time: 11/14/17 12:50 PM  Result Value Ref Range   Influenza A By PCR POSITIVE (A) NEGATIVE   Influenza B By PCR NEGATIVE NEGATIVE    Comment: (NOTE) The Xpert Xpress Flu assay is intended as an aid in the diagnosis of  influenza and should not be used as a sole basis for treatment.  This  assay is FDA approved for nasopharyngeal swab specimens only. Nasal  washings and aspirates are unacceptable for Xpert Xpress Flu testing. Performed at [facility], [place] [address], [place], [place] [address]   Protime-INR     Status:  None   Collection Time: 11/14/17 12:50 PM  Result Value Ref Range   Prothrombin Time 12.0 11.4 - 15.2 seconds   INR 0.90     Comment: Performed at [facility] [address] [place], [place] [address]  Culture, blood (routine x 2)     Status: None (Preliminary result)   Collection Time: 11/14/17 12:50 PM  Result Value Ref Range   Specimen Description BLOOD LEFT ANTECUBITAL    Special Requests      BOTTLES DRAWN AEROBIC AND ANAEROBIC Blood Culture results may not be optimal due to an excessive volume of blood received in culture bottles   Culture      NO GROWTH < 24 HOURS Performed at [facility] [address] [place], [place] [address]    Report Status PENDING   CK     Status: Abnormal   Collection Time: 11/14/17 12:50 PM  Result Value Ref Range   Total CK 296 (H) 38 - 234 U/L    Comment: Performed at [facility], [place] [address], [place], [place] [address]  Rapid HIV screen (HIV 1/2 Ab+Ag)     Status: None   Collection Time: 11/14/17 12:50 PM  Result Value Ref Range   HIV-1 P24 Antigen - HIV24 NON REACTIVE NON REACTIVE   HIV 1/2 Antibodies NON REACTIVE NON REACTIVE   Interpretation (HIV Ag Ab)      A non reactive test result means that HIV 1 or HIV 2 antibodies and HIV 1 p24 antigen were not detected in the specimen.    Comment: Performed at [facility], [place] [address] [place], [place] [address]  Brain natriuretic peptide     Status: None   Collection Time: 11/14/17 12:50 PM  Result Value Ref Range  B Natriuretic Peptide 13.3 0.0 - 100.0 pg/mL    Comment: Performed at [facility], [place] [address], [place], [place] [address]  Culture, blood (routine x 2)     Status: None (Preliminary result)   Collection Time: 11/14/17  1:00 PM  Result Value Ref Range   Specimen Description BLOOD RIGHT HAND    Special Requests      BOTTLES DRAWN AEROBIC AND ANAEROBIC Blood Culture adequate volume   Culture      NO GROWTH < 24 HOURS Performed at [facility], [place] [address] [place], [place] [address]    Report Status PENDING   I-stat troponin, ED     Status: None   Collection Time: 11/14/17  1:02 PM  Result Value Ref Range   Troponin i, poc 0.00 0.00 - 0.08 ng/mL   Comment 3            Comment: Due to the release kinetics of cTnI, a negative result within the first hours of the onset of symptoms does not rule out myocardial infarction with certainty. If myocardial infarction is still suspected, repeat the test at appropriate intervals.   Urinalysis, Routine w reflex microscopic     Status: Abnormal   Collection Time: 11/14/17  2:24 PM  Result Value Ref Range   Color, Urine YELLOW YELLOW   APPearance CLEAR CLEAR   Specific Gravity, Urine 1.013 1.005 - 1.030   pH 5.0 5.0 - 8.0   Glucose, UA NEGATIVE NEGATIVE mg/dL   Hgb urine dipstick NEGATIVE NEGATIVE   Bilirubin Urine NEGATIVE NEGATIVE   Ketones, ur 5 (A) NEGATIVE mg/dL   Protein, ur NEGATIVE NEGATIVE mg/dL   Nitrite NEGATIVE NEGATIVE   Leukocytes, UA TRACE (A) NEGATIVE   RBC / HPF 0-5 0 - 5 RBC/hpf   WBC, UA 0-5 0 - 5 WBC/hpf   Bacteria, UA NONE SEEN NONE SEEN   Squamous Epithelial / LPF 0-5 (A) NONE SEEN   Mucus PRESENT    Hyaline Casts, UA PRESENT     Comment: Performed at [facility], [address] [place], [place] [address]  Urine culture     Status: Abnormal   Collection Time: 11/14/17  2:24 PM  Result Value Ref Range   Specimen Description URINE, CLEAN CATCH    Special Requests      NONE Performed at [facility], [place] [address], [place], [place] [address]    Culture MULTIPLE SPECIES PRESENT, SUGGEST RECOLLECTION (A)    Report Status 11/15/2017 FINAL   I-Stat CG4 Lactic Acid, ED     Status: None   Collection Time: 11/14/17  2:59 PM  Result Value Ref Range   Lactic Acid, Venous 1.08 0.5 - 1.9 mmol/L  Basic metabolic panel     Status: Abnormal   Collection Time: 11/15/17  4:39 AM  Result Value Ref Range   Sodium 135 135 - 145 mmol/L    Potassium 3.4 (L) 3.5 - 5.1 mmol/L   Chloride 103 101 - 111 mmol/L   CO2 22 22 - 32 mmol/L   Glucose, Bld 90 65 - 99 mg/dL   BUN 21 (H) 6 - 20 mg/dL   Creatinine, Ser 1.23 (H) 0.44 - 1.00 mg/dL   Calcium 8.7 (L) 8.9 - 10.3 mg/dL   GFR calc non Af Amer 40 (L) >60 mL/min   GFR calc Af Amer 46 (L) >60 mL/min    Comment: (NOTE) The eGFR has been calculated using the CKD  EPI equation. This calculation has not been validated in all clinical situations. eGFR's persistently <60 mL/min signify possible Chronic Kidney Disease.    Anion gap 10 5 - 15    Comment: Performed at [facility] [address], [place], [place] [address]  CBC     Status: Abnormal   Collection Time: 11/15/17  4:39 AM  Result Value Ref Range   WBC 1.8 (L) 4.0 - 10.5 K/uL   RBC 3.57 (L) 3.87 - 5.11 MIL/uL   Hemoglobin 11.0 (L) 12.0 - 15.0 g/dL   HCT 33.1 (L) 36.0 - 46.0 %   MCV 92.7 78.0 - 100.0 fL   MCH 30.8 26.0 - 34.0 pg   MCHC 33.2 30.0 - 36.0 g/dL   RDW 13.3 11.5 - 15.5 %   Platelets 240 150 - 400 K/uL    Comment: Performed at [facility], [place] [address] [place], [place] [address]  CK     Status: Abnormal   Collection Time: 11/15/17  4:39 AM  Result Value Ref Range   Total CK 324 (H) 38 - 234 U/L    Comment: Performed at [facility], [place] [address] [place], [place] [address]  CK     Status: Abnormal   Collection Time: 11/15/17 10:43 AM  Result Value Ref Range   Total CK 323 (H) 38 - 234 U/L    Comment: Performed at [facility], [place] [address], [place],  [address]     Lipid Panel  No results found for: CHOL, TRIG, HDL, CHOLHDL, VLDL, LDLCALC, LDLDIRECT   No results found for: HGBA1C   Lab Results  Component Value Date   CREATININE 1.23 (H) 11/15/2017     HPI   83 y.o. female with medical history significant for transsphenoidal resection of pituitary tumor in 2016, history of MI, CAD, GERD, retention, hyperlipidemia, presenting to urgent care today for  evaluation of headache.  She also reported some subjective fever, and chills.  She has not been able to eat, drink or take her medications, due to feeling very weak.  At urgent care, she was hypotensive, tachycardic, and a T-max of 100.8.  She also was coughing yellow mucus.  The patient did not take Tylenol that time.  She was sent to the emergency department for further evaluation.  She denies any chest pain or palpitations.  She denies any lower extremity swelling or calf pain.  She is unaware of sick contacts.  Of note, she was seen at the ED for flank pain on 11/09/2017 for possible UTI.  She denies any symptoms at this time. On arrival, the patient continues to feel very weak, however her hypotension and tachycardia has resolved.  Influenza panel was drawn, and was positive for flu.  She was initiated on Tamiflu.  Being admitted for management of her initial symptoms.     HOSPITAL COURSE:     #Acute hypoxic respiratory failure due to influenza A -Tamiflu x 5 days, weaned oxygen,  patient found to have oxygen saturation of 97% at rest and 9297% with ambulation Continue Tamiflu and, tessalon pearls  #Leukopenia, worsening - in setting of flu virus -WBC trend: 2.4--1.8 , likely due to viral infection, monitor CBC as outpatient closely, - Blood cultures no growth so far    #AKI, likely pre-renal, improving -decreased PO intake with flu - baseline creatinine 0.9-1, Cr trend: 1.38--1.23  -improving with IVF, requested patient to hold Hyzaar for the next week  #Hypokalemia  #  Elevated CK, improving - rhabdomyolysis, no history of fall -?being in bed - Trend 296--324--323 , now downtrending Hydrated with IV fluids  #CAD, stable Asymptomatic -continue home aspirin  #H/o pituitary tumor s/p transphenoidal resection - CT head: residual 1 cm diameter adenoma in left sella, w/o suprasellar extension - Stressed importance of neurosurgery follow up for surveillance ( last done in  05/2017 per patient) - she states she gets yearly scans  #HTN, stable - well controlled off BP meds currently - home losartan/HCTZ holding in setting of AKI  #HLD - home simvastatin  #GERD - protonix ( home prilosec)    Discharge Exam:  Blood pressure 125/78, pulse 74, temperature 98.7 F (37.1 C), temperature source Oral, resp. rate 17, height '5\' 6"'  (1.676 m), weight 69.3 kg (152 lb 12.5 oz), SpO2 91 %.   Constitutional:elderly, frail appearing female Eyes: EOMI, anicteric, normal conjunctivae ENMT: Oropharynx with moist mucous membranes, normal dentition Neck: FROM Cardiovascular: RRR no MRGs, with no peripheral edema Respiratory: Normal respiratory effort on 2 L [place], clear breath sounds with no wheezing or crackles Abdomen: Soft,non-tender, with no HSM Skin: No rash ulcers, or lesions. Without skin tenting  Neurologic: Grossly no focal neuro deficit. Psychiatric:Appropriate affect, and mood. Mental status AAOx4       Signed: Reyne Dumas 11/16/2017, 10:31 AM        Time spent >1 hour

## 2017-11-16 NOTE — Evaluation (Signed)
Physical Therapy Evaluation Patient Details Name: Adriana Jordan MRN: [id number] DOB: [birth year] Today's Date: 11/16/2017   History of Present Illness  83yo female c/o fever, chills, reduced appetite, and weakness. Note recent ED visit due to UTI on 11/09/17. She tested positive for flu and was diagnosed with acute respiratory failure with hypoxia secondary to influenza. PMH OA, hx brain tumor and crainiotomy, hx MI, hx back surgery, hx cervical surgery, hx RCR   Clinical Impression   Patient received up in chair, very pleasant and willing to participate with PT this morning; she reports she is very active at baseline, note SpO2 on room air at rest 97%. She is Mod(I) with all functional transfers and gait this morning, no significant balance, gait mechanic, or safety deficits noted throughout activity with PT today. SpO2 remained between 90-97% on room air during gait distance. At this time she does not appear to be in need of skilled PT services in the acute setting or following discharge. PT signing off, please feel free to reorder PT if new concerns related to mobility arise. Thank you for the referral.     Follow Up Recommendations No PT follow up    Equipment Recommendations  None recommended by PT    Recommendations for Other Services       Precautions / Restrictions Precautions Precautions: Other (comment) Precaution Comments: droplet precautions  Restrictions Weight Bearing Restrictions: No      Mobility  Bed Mobility               General bed mobility comments: DNT, received up in chair   Transfers Overall transfer level: Modified independent Equipment used: None Transfers: Sit to/from Stand           General transfer comment: Mod(I), no safety or balance deficits noted   Ambulation/Gait Ambulation/Gait assistance: Modified independent (Device/Increase time) Ambulation Distance (Feet): 500 Feet Assistive device: 1 person hand held assist(held onto IV  pole ) Gait Pattern/deviations: WFL(Within Functional Limits)     General Gait Details: gait mechanics and balance during gait all appear WNL, no concerns noted for mobility or safety with gait; SpO2 on room air remained between 90-97%  Stairs            Wheelchair Mobility    Modified Rankin (Stroke Patients Only)       Balance Overall balance assessment: Independent;No apparent balance deficits (not formally assessed)                                           Pertinent Vitals/Pain Pain Assessment: No/denies pain    Home Living Family/patient expects to be discharged to:: Private residence Living Arrangements: Children Available Help at Discharge: Family;Available PRN/intermittently Type of Home: House Home Access: Stairs to enter Entrance Stairs-Rails: Can reach both Entrance Stairs-Number of Steps: 3-4 Home Layout: One level Home Equipment: None      Prior Function Level of Independence: Independent               Hand Dominance        Extremity/Trunk Assessment   Upper Extremity Assessment Upper Extremity Assessment: Overall WFL for tasks assessed    Lower Extremity Assessment Lower Extremity Assessment: Overall WFL for tasks assessed    Cervical / Trunk Assessment Cervical / Trunk Assessment: Normal  Communication   Communication: No difficulties  Cognition Arousal/Alertness: Awake/alert Behavior During Therapy: WFL for tasks assessed/performed  Overall Cognitive Status: Within Functional Limits for tasks assessed                                        General Comments      Exercises     Assessment/Plan    PT Assessment Patent does not need any further PT services  PT Problem List         PT Treatment Interventions      PT Goals (Current goals can be found in the Care Plan section)  Acute Rehab PT Goals Patient Stated Goal: to go home and enjoy the sunshine  PT Goal Formulation: With  patient Time For Goal Achievement: 11/30/17 Potential to Achieve Goals: Good    Frequency     Barriers to discharge        Co-evaluation               AM-PAC PT "6 Clicks" Daily Activity  Outcome Measure Difficulty turning over in bed (including adjusting bedclothes, sheets and blankets)?: None Difficulty moving from lying on back to sitting on the side of the bed? : None Difficulty sitting down on and standing up from a chair with arms (e.g., wheelchair, bedside commode, etc,.)?: None Help needed moving to and from a bed to chair (including a wheelchair)?: None Help needed walking in hospital room?: None Help needed climbing 3-5 steps with a railing? : None 6 Click Score: 24    End of Session Equipment Utilized During Treatment: Gait belt Activity Tolerance: Patient tolerated treatment well Patient left: in chair;with call bell/phone within reach   PT Visit Diagnosis: Muscle weakness (generalized) (M62.81)    Time: 3220-2542 PT Time Calculation (min) (ACUTE ONLY): 14 min   Charges:   PT Evaluation $PT Eval Low Complexity: 1 Low     PT G Codes:        Deniece Ree PT, DPT, CBIS  Supplemental Physical Therapist [place]   Pager [phone]

## 2017-11-16 NOTE — Progress Notes (Signed)
Pt verbalized of feeling better. Minimal coughing, no SOB. Wants to go home. Discharge instructions given to pt, verbalized understanding. Discharged to home.

## 2017-11-19 LAB — CULTURE, BLOOD (ROUTINE X 2)
CULTURE: NO GROWTH
Culture: NO GROWTH
Special Requests: ADEQUATE

## 2018-06-14 ENCOUNTER — Other Ambulatory Visit: Payer: Self-pay | Admitting: Radiation Therapy

## 2018-06-15 NOTE — Progress Notes (Signed)
Location/Histology of Brain Tumor:  Per Dr. Kathyrn Sheriff an MRI dated 05/31/18 demonstrated subtle increase in height of the pituitary adenoma by approximately 1 and 0.5 millimeters. Tumor remains confined to the level of the sella, with some extension into the left cavernous sinus but no suprasellar extension at this point.   Patient presented with symptoms of:  She originally presented with a headache in 2016.   Past or anticipated interventions, if any, per neurosurgery:  06/12/18 Dr. Kathyrn Sheriff office visit: Plan: -I will plan on referring her to our radiation oncology colleagues for evaluation for possible stereotactic radiosurgery to the enlarging pituitary adenoma.  -I did review the MRI findings with the patient. I told her that given the interval growth I think treatment would be reasonable. I did recommend stereotactic radiosurgery. I told her that we would be referring her to our radiation oncology colleagues for further discussion about the radiation treatment. She seems agreeable to this plan. All her questions were answered.    10/132016 Dr. Joya Salm PROCEDURE:  Trans-sphenoidal total gross resection of the pituitary tumor.  Bone graft from the abdominal wall.  Microscope. SURGEON:  Leeroy Cha, MD CO-SURGEONLeonides Sake. Lucia Gaskins, MD, from ENT Service  Past or anticipated interventions, if any, per medical oncology: 06/19/18 Tumor board (Dr. Mickeal Skinner)   Dose of Decadron, if applicable: N/A  Recent neurologic symptoms, if any:   Seizures: No  Headaches: She reports headaches in the past.   Nausea: No  Dizziness/ataxia: No  Difficulty with hand coordination: No  Focal numbness/weakness: No  Visual deficits/changes: She reports blurriness to her bilateral eyes. Her eye doctor has told her that it is related to dry eyes, and she was instructed to use artificial tears.   Confusion/Memory deficits: No   SAFETY ISSUES:  Prior radiation? No  Pacemaker/ICD?  No  Possible current pregnancy? No  Is the patient on methotrexate? No  Additional Complaints / other details:  She reports lower back pain related to a recently pulled muscle.  BP (!) 147/72 (BP Location: Left Arm)   Pulse 78   Temp 97.8 F (36.6 C) (Oral)   Ht 5\' 6"  (1.676 m)   Wt 151 lb 12.8 oz (68.9 kg)   LMP  (LMP Unknown)   SpO2 99% Comment: room air  BMI 24.50 kg/m    Wt Readings from Last 3 Encounters:  06/20/18 151 lb 12.8 oz (68.9 kg)  11/16/17 152 lb 12.5 oz (69.3 kg)  11/09/17 157 lb (71.2 kg)

## 2018-06-19 ENCOUNTER — Inpatient Hospital Stay: Payer: Medicare Other | Attending: Neurosurgery

## 2018-06-20 ENCOUNTER — Other Ambulatory Visit: Payer: Self-pay | Admitting: Radiation Therapy

## 2018-06-20 ENCOUNTER — Other Ambulatory Visit: Payer: Self-pay

## 2018-06-20 ENCOUNTER — Ambulatory Visit
Admission: RE | Admit: 2018-06-20 | Discharge: 2018-06-20 | Disposition: A | Discharge status: Home / Self Care | Payer: Medicare Other | Source: Ambulatory Visit | Attending: Radiation Oncology | Admitting: Radiation Oncology

## 2018-06-20 ENCOUNTER — Encounter: Payer: Self-pay | Admitting: Radiation Oncology

## 2018-06-20 VITALS — BP 147/72 | HR 78 | Temp 97.8°F | Ht 66.0 in | Wt 151.8 lb

## 2018-06-20 DIAGNOSIS — D497 Neoplasm of unspecified behavior of endocrine glands and other parts of nervous system: Secondary | ICD-10-CM

## 2018-06-20 DIAGNOSIS — Z79899 Other long term (current) drug therapy: Secondary | ICD-10-CM | POA: Diagnosis not present

## 2018-06-20 DIAGNOSIS — D352 Benign neoplasm of pituitary gland: Secondary | ICD-10-CM

## 2018-06-20 DIAGNOSIS — Z7982 Long term (current) use of aspirin: Secondary | ICD-10-CM | POA: Diagnosis not present

## 2018-06-20 DIAGNOSIS — Z87891 Personal history of nicotine dependence: Secondary | ICD-10-CM | POA: Insufficient documentation

## 2018-06-20 NOTE — Progress Notes (Signed)
Radiation Oncology         (336) [phone] ________________________________  Initial Outpatient Consultation  Name: Adriana Jordan: [id number]  Date: 06/20/2018  DOB: November 04, 1934  EK:[id number], Vicenta Aly, MD  Adriana Lose, MD   REFERRING PHYSICIAN: Consuella Lose, MD  DIAGNOSIS:    ICD-10-CM   1. Pituitary tumor D49.7     CHIEF COMPLAINT:  Here to discuss management of pituitary adenoma  HISTORY OF PRESENT ILLNESS::Adriana Jordan is an 83 y.o. female who initially presented in 2016 with dizziness, nausea, vomiting, and persistent headache. CT scan of the head at that time showed a pituitary enlargement with expansion of the sella. Prolactin level on 04-09-15 was 43.4. According to note by Adriana Adriana Jordan, following additional labs, "We were checking to see if the pituitary was producing excess hormone, but we did not find this."  She subsequently underwent resection of the pituitary tumor by Adriana. Joya Jordan on 05/21/15. Pathology revealed pituitary adenoma.  Since then she has been followed with routine brain imaging. The patient underwent follow-up MRI of the brain on 05/31/18. I reviewed this with our CNS team at tumor board. This showed continued slowed growth of residual tumor within the central to left side of the pituitary gland. It currently measures 9.8 x 21.5 mm and previously a year ago had measured 8.4 x 20.4 mm. The tumor has a good distance from the optic chiasm. I reviewed her images this week with the tumor board.  The patient reviewed these results with Adriana. Kathyrn Jordan and has been referred today for consideration of stereotactic radiosurgery to the enlarging recurrent pituitary adenoma. She is scheduled for a 3T MRI for treatment planning on 06/26/18.  On review of systems, the patient is positive for bilateral blurred vision. She states that her eye doctor has told her that it is related to dry eyes and that she was instructed to use artificial tears. She reports  arthritis in her fingers. She reports history of headaches. She reports lower back pain related to a recently pulled muscle.  She has remained very physically active with gardening and walking.  PREVIOUS RADIATION THERAPY: No  PAST MEDICAL HISTORY:  has a past medical history of Arthritis, Brain tumor ([place]), Chronic lower back pain, Coronary artery disease, GERD (gastroesophageal reflux disease), History of blood transfusion, Hypercholesteremia, Hypertension, MI (myocardial infarction) ([place]) (2006), PONV (postoperative nausea and vomiting), and Skin cancer.    PAST SURGICAL HISTORY: Past Surgical History:  Procedure Laterality Date  . ANTERIOR CERVICAL DECOMP/DISCECTOMY FUSION    . BACK SURGERY    . CARPAL TUNNEL RELEASE Bilateral   . CORONARY ANGIOPLASTY     DES mid LAD and mid RCA 09/21/04  . CRANIOTOMY N/A 05/21/2015   Procedure: Transsphenoidal resection of pituitary tumor with Adriana. Radene Jordan for approach;  Surgeon: Adriana Cha, MD;  Location: [facility] NEURO [facility]S;  Service: Neurosurgery;  Laterality: N/A;  Transsphenoidal resection of pituitary tumor with Adriana. Radene Jordan for approach  . SHOULDER OPEN ROTATOR CUFF REPAIR Bilateral   . SKIN CANCER EXCISION Right    forearm  . thumb surgery Right    placed srews to make straight  . THYROID SURGERY     "goiter removed"    FAMILY HISTORY: family history includes Breast cancer (age of onset: 4) in her sister; Cancer (age of onset: 9) in her brother; Cancer (age of onset: 54) in her father.  SOCIAL HISTORY:  reports that she quit smoking about 49 years ago. Her smoking use included cigarettes. She  has a 0.60 pack-year smoking history. She has never used smokeless tobacco. She reports that she drinks alcohol. She reports that she does not use drugs.  ALLERGIES: Patient has no known allergies.  MEDICATIONS:  Current Outpatient Medications  Medication Sig Dispense Refill  . Artificial Tear Ointment (DRY EYES OP) Place 2 drops into both  eyes daily as needed.    Marland Kitchen aspirin 81 MG tablet Take 81 mg by mouth daily.    Marland Kitchen losartan-hydrochlorothiazide (HYZAAR) 100-12.5 MG tablet Take 1 tablet by mouth daily. 30 tablet 3  . Multiple Vitamin (MULTIVITAMIN) tablet Take 1 tablet by mouth daily. Women    . omeprazole (PRILOSEC) 20 MG capsule Take 20 mg by mouth daily.    . simvastatin (ZOCOR) 40 MG tablet Take 40 mg by mouth daily.     Current Facility-Administered Medications  Medication Dose Route Frequency Provider Last Rate Last Dose  . triamcinolone acetonide (KENALOG) 10 MG/ML injection 10 mg  10 mg Other Once Adriana Jordan        REVIEW OF SYSTEMS:  A 10+ POINT REVIEW OF SYSTEMS WAS OBTAINED including neurology, dermatology, psychiatry, cardiac, respiratory, lymph, extremities, GI, GU, Musculoskeletal, constitutional, breasts, reproductive, HEENT.  All pertinent positives are noted in the HPI.  All others are negative.   PHYSICAL EXAM:  height is 5\' 6"  (1.676 m) and weight is 151 lb 12.8 oz (68.9 kg). Her oral temperature is 97.8 F (36.6 C). Her blood pressure is 147/72 (abnormal) and her pulse is 78. Her oxygen saturation is 99%.   General: Alert and oriented, in no acute distress. HEENT: Head is normocephalic. Visual quadrants are intact. Pupils are equally round and reactive to light. Extraocular movements are intact. She has upper dentures. Oral cavity is clear. Neck: Neck is supple, no palpable cervical or supraclavicular lymphadenopathy. Heart: Regular in rate and rhythm. Soft systolic murmur at the aortic region. Chest: Clear to auscultation bilaterally, with no rhonchi, wheezes, or rales. Abdomen: Soft, nontender, nondistended, with no rigidity or guarding. Extremities: No cyanosis or edema. Lymphatics: see Neck Exam Skin: No concerning lesions. Musculoskeletal: Symmetric strength and muscle tone throughout. Neurologic: Cranial nerves II through XII are grossly intact. No obvious focalities. Speech is fluent.  Coordination is intact. Finger-to-nose testing intact. Visual fields intact. Psychiatric: Judgment and insight are intact. Affect is appropriate.   KPS = 90  100 - Normal; no complaints; no evidence of disease. 90   - Able to carry on normal activity; minor signs or symptoms of disease. 80   - Normal activity with effort; some signs or symptoms of disease. 63   - Cares for self; unable to carry on normal activity or to do active work. 60   - Requires occasional assistance, but is able to care for most of his personal needs. 50   - Requires considerable assistance and frequent medical care. 18   - Disabled; requires special care and assistance. 22   - Severely disabled; hospital admission is indicated although death not imminent. 34   - Very sick; hospital admission necessary; active supportive treatment necessary. 10   - Moribund; fatal processes progressing rapidly. 0     - Dead  Karnofsky DA, Abelmann WH, Craver LS and Burchenal [facility] [phone]) The use of the nitrogen mustards in the palliative treatment of carcinoma: with particular reference to bronchogenic carcinoma Cancer 1 634-56   LABORATORY DATA:  Lab Results  Component Value Date   WBC 1.8 (L) 11/15/2017   HGB 11.0 (L) 11/15/2017  HCT 33.1 (L) 11/15/2017   MCV 92.7 11/15/2017   PLT 240 11/15/2017   CMP     Component Value Date/Time   NA 135 11/15/2017 0439   K 3.4 (L) 11/15/2017 0439   CL 103 11/15/2017 0439   CO2 22 11/15/2017 0439   GLUCOSE 90 11/15/2017 0439   BUN 21 (H) 11/15/2017 0439   CREATININE 1.23 (H) 11/15/2017 0439   CALCIUM 8.7 (L) 11/15/2017 0439   PROT 7.2 11/14/2017 1250   ALBUMIN 3.7 11/14/2017 1250   AST 43 (H) 11/14/2017 1250   ALT 22 11/14/2017 1250   ALKPHOS 61 11/14/2017 1250   BILITOT 0.6 11/14/2017 1250   GFRNONAA 40 (L) 11/15/2017 0439   GFRAA 46 (L) 11/15/2017 0439         RADIOGRAPHY:  As above    IMPRESSION/PLAN: Pituitary adenoma, progressive  I had a lengthy discussion with  the patient after reviewing the MRI results with her.  I do agree with the tumor board consensus to proceed with stereotactic radiosurgery to the mass for salvage. We spoke about the risks, benefits, and side effects of this treatment. The patient would like to proceed. A consent form was signed and placed in the patient's chart.  She will undergo a 3T MRI on 06/26/18, and CT simulation will take place on 06/27/18.   Hormone supplementation / endocrine labs in future: Mont Dutton will check with Adriana. Kathyrn Jordan or refer to endocrinologist.   Soft systolic murmur: Advised patient to discuss w/ PCP  __________________________________________   Eppie Gibson, MD  This document serves as a record of services personally performed by Eppie Gibson, MD. It was created on her behalf by Rae Lips, a trained medical scribe. The creation of this record is based on the scribe's personal observations and the provider's statements to them. This document has been checked and approved by the attending provider.

## 2018-06-21 NOTE — Progress Notes (Signed)
Has armband been applied?  Yes  Does patient have an allergy to IV contrast dye?: No   Has patient ever received premedication for IV contrast dye?: N/A  Does patient take metformin?: No  If patient does take metformin when was the last dose: N/A  Date of lab work: 06/26/18  BUN: 30 CR: 1.4 EGFR: 35  IV site: Left AC  Has IV site been added to flowsheet?  Yes.

## 2018-06-22 NOTE — Progress Notes (Signed)
Brain and Spine Tumor Board Documentation  Adriana Jordan was presented by Cecil Cobbs, MD at Brain and Spine Tumor Board on 06/22/2018, which included representatives from neuro oncology, radiation oncology, surgical oncology, radiology, pathology, navigation.  Adriana Jordan was presented as a current patient with history of the following treatments:  .  Additionally, we reviewed previous medical and familial history, history of present illness, and recent lab results along with all available histopathologic and imaging studies. The tumor board considered available treatment options and made the following recommendations:  Radiation therapy (primary modality) SRS  Tumor board is a meeting of clinicians from various specialty areas who evaluate and discuss patients for whom a multidisciplinary approach is being considered. Final determinations in the plan of care are those of the provider(s). The responsibility for follow up of recommendations given during tumor board is that of the provider.   Today's extended care, comprehensive team conference, Adriana Jordan was not present for the discussion and was not examined.

## 2018-06-23 ENCOUNTER — Encounter: Payer: Self-pay | Admitting: Radiation Oncology

## 2018-06-26 ENCOUNTER — Ambulatory Visit
Admission: RE | Admit: 2018-06-26 | Discharge: 2018-06-26 | Disposition: A | Discharge status: Home / Self Care | Payer: Medicare Other | Source: Ambulatory Visit | Attending: Radiation Oncology | Admitting: Radiation Oncology

## 2018-06-26 ENCOUNTER — Telehealth: Payer: Self-pay | Admitting: Endocrinology

## 2018-06-26 DIAGNOSIS — D352 Benign neoplasm of pituitary gland: Secondary | ICD-10-CM

## 2018-06-26 MED ORDER — GADOBENATE DIMEGLUMINE 529 MG/ML IV SOLN
7.0000 mL | Freq: Once | INTRAVENOUS | Status: AC | PRN
  Administered 2018-06-26: 7 mL via INTRAVENOUS

## 2018-06-26 NOTE — Telephone Encounter (Signed)
Per Shirlee Limerick, pt does not wish to schedule at this time since she is confused about the need for her to be seen. Advised pt is welcome to further discuss her concerns with Rad Onc. If they feel she needs to be seen by Dr. Loanne Drilling AND pt is agreeable, pt can be referred and scheduled as new pt.

## 2018-06-26 NOTE — Telephone Encounter (Signed)
-----   Message from Pincus Large sent at 06/26/2018 11:17 AM EST ----- Dr. Loanne Drilling, Ms. Wesenberg has been referred to Radiation Oncology for treatment of her enlarging pituitary  adenoma. Based on your encounter with her back in 2016, and her lab work, do you feel that this was a prolactinoma at the onset?    Thank you for your help, Mont Dutton R.T.(R)(T) Special Procedures Navigator

## 2018-06-26 NOTE — Telephone Encounter (Signed)
I would need to see this patient again in order to make any comment.

## 2018-06-26 NOTE — Telephone Encounter (Signed)
Called pt to schedule an appt at Dr. Cordelia Pen request.

## 2018-06-26 NOTE — Telephone Encounter (Signed)
Please call pt for appt

## 2018-06-27 ENCOUNTER — Ambulatory Visit
Admission: RE | Admit: 2018-06-27 | Discharge: 2018-06-27 | Disposition: A | Discharge status: Home / Self Care | Payer: Medicare Other | Source: Ambulatory Visit | Attending: Radiation Oncology | Admitting: Radiation Oncology

## 2018-06-27 VITALS — BP 129/70 | HR 80 | Temp 97.7°F | Wt 146.0 lb

## 2018-06-27 DIAGNOSIS — D352 Benign neoplasm of pituitary gland: Secondary | ICD-10-CM

## 2018-06-27 MED ORDER — SODIUM CHLORIDE 0.9% FLUSH
10.0000 mL | Freq: Once | INTRAVENOUS | Status: AC
  Administered 2018-06-27: 10 mL via INTRAVENOUS

## 2018-06-27 NOTE — Addendum Note (Signed)
Encounter addended by: Eppie Gibson, MD on: 06/27/2018 5:07 PM  Actions taken: Problem List reviewed, Sign clinical note

## 2018-06-27 NOTE — Progress Notes (Signed)
   Radiation Oncology         (336) [phone] ________________________________  Name: SELBY SLOVACEK MRN: [id number]  Date: 06/27/2018  DOB: 07-13-1935  SIMULATION AND TREATMENT PLANNING NOTE; SPECIAL TREATMENT PROCEDURE NOTE:   DIAGNOSIS:    ICD-10-CM   1. Pituitary adenoma ([place]) D35.2 Ambulatory referral to Endocrinology    NARRATIVE:  The patient was brought to the [place].  Identity was confirmed.  All relevant records and images related to the planned course of therapy were reviewed.  The patient freely provided informed written consent to proceed with treatment after reviewing the details related to the planned course of therapy. The consent form was witnessed and verified by the simulation staff. Intravenous access was established for contrast administration. Then, the patient was set-up in a stable reproducible supine position for radiation therapy.  A relocatable thermoplastic stereotactic head frame was fabricated for precise immobilization.  CT images were obtained.  Surface markings were placed.  The CT images were loaded into the planning software and fused with the patient's targeting MRI scan.  Then the target and avoidance structures were contoured.  Treatment planning then occurred.  The radiation prescription was entered and confirmed.  I have requested 3D planning  I have requested a DVH of the following structures: Brain stem, brain, left eye, right eye, lenses, optic chiasm, target volumes, uninvolved brain, and normal tissue.    PLAN:  The patient will receive 13 Gy in 1 fraction to the pituitary tumor. Stereotactic radiosurgery will be used.  SPECIAL TREATMENT PROCEDURE NOTE:   This constitutes a special treatment procedure due to the ablative dose that will be delivered and the technical nature of treatment.  This highly technical modality of treatment ensures that the ablative dose is centered on the patient's tumor while sparing normal tissues from  excessive dose and risk of detrimental effects.   -----------------------------------  Eppie Gibson, MD

## 2018-06-28 ENCOUNTER — Institutional Professional Consult (permissible substitution): Payer: Self-pay | Admitting: Radiation Oncology

## 2018-06-28 DIAGNOSIS — D352 Benign neoplasm of pituitary gland: Secondary | ICD-10-CM | POA: Diagnosis not present

## 2018-07-03 ENCOUNTER — Ambulatory Visit
Admission: RE | Admit: 2018-07-03 | Discharge: 2018-07-03 | Disposition: A | Discharge status: Home / Self Care | Payer: Medicare Other | Source: Ambulatory Visit | Attending: Radiation Oncology | Admitting: Radiation Oncology

## 2018-07-03 ENCOUNTER — Encounter: Payer: Self-pay | Admitting: Radiation Oncology

## 2018-07-03 VITALS — BP 141/65 | HR 70 | Temp 97.8°F | Resp 18

## 2018-07-03 DIAGNOSIS — D352 Benign neoplasm of pituitary gland: Secondary | ICD-10-CM | POA: Diagnosis not present

## 2018-07-03 DIAGNOSIS — D497 Neoplasm of unspecified behavior of endocrine glands and other parts of nervous system: Secondary | ICD-10-CM

## 2018-07-03 DIAGNOSIS — Z923 Personal history of irradiation: Secondary | ICD-10-CM

## 2018-07-03 HISTORY — DX: Personal history of irradiation: Z92.3

## 2018-07-03 NOTE — Progress Notes (Signed)
  Radiation Oncology         [phone]) [phone] ________________________________  Stereotactic Treatment Procedure Note outpatient    ICD-10-CM   1. Pituitary tumor [facility]) D49.7     Name: Adriana Jordan MRN: [id number]  Date: 07/03/2018  DOB: 1934/11/13  SPECIAL TREATMENT PROCEDURE  3D TREATMENT PLANNING AND DOSIMETRY:  The patient's radiation plan was reviewed and approved by neurosurgery and radiation oncology prior to treatment.  It showed 3-dimensional radiation distributions overlaid onto the planning CT/MRI image set.  The [facility] for the target structures as well as the organs at risk were reviewed. The documentation of the 3D plan and dosimetry are filed in the radiation oncology EMR.  NARRATIVE:  Adriana Jordan was brought to the TrueBeam stereotactic radiation treatment machine and placed supine on the CT couch. The head frame was applied, and the patient was set up for stereotactic radiosurgery.  Neurosurgery was present for the set-up and delivery  SIMULATION VERIFICATION:  In the couch zero-angle position, the patient underwent Exactrac imaging using the Brainlab system with orthogonal KV images.  These were carefully aligned and repeated to confirm treatment position for each of the isocenters.  The Exactrac snap film verification was repeated at each couch angle.  SPECIAL TREATMENT PROCEDURE: Adriana Jordan received stereotactic radiosurgery to the following targets: Comments: pituitary tumor target was treated using 4 Rapid Arc VMAT Beams to a prescription dose of 12.5 Gy.  ExacTrac Snap verification was performed for each couch angle.   This constitutes a special treatment procedure due to the ablative dose delivered and the technical nature of treatment.  This highly technical modality of treatment ensures that the ablative dose is centered on the patient's tumor while sparing normal tissues from excessive dose and risk of detrimental effects.  STEREOTACTIC TREATMENT  MANAGEMENT:  Following delivery, the patient was transported to nursing in stable condition and monitored for possible acute effects.  Vital signs were recorded BP (!) 141/65   Pulse 70   Temp 97.8 F (36.6 C) (Oral)   Resp 18   LMP  (LMP Unknown)   SpO2 98% Comment: room air. The patient tolerated treatment without significant acute effects, and was discharged to home in stable condition.    PLAN: Follow-up in one month.  ________________________________   Eppie Gibson, MD

## 2018-07-05 NOTE — Addendum Note (Signed)
Encounter addended by: Consuella Lose, MD on: 07/05/2018 9:29 AM  Actions taken: Sign clinical note

## 2018-07-05 NOTE — Op Note (Signed)
Name: TECKLA CHRISTIANSEN    MRN: [id number]   Date: 07/03/2018    DOB: 06/13/1935   STEREOTACTIC RADIOSURGERY OPERATIVE NOTE  PRE-OPERATIVE DIAGNOSIS:  Recurrent pituitary adenoma  POST-OPERATIVE DIAGNOSIS:  Same  PROCEDURE:  Stereotactic Radiosurgery  SURGEON:  Consuella Lose, MD  RADIATION ONCOLOGIST: Dr. Eppie Gibson, MD  TECHNIQUE:  The patient underwent a radiation treatment planning session in the radiation oncology simulation suite under the care of the radiation oncology physician and physicist.  I participated closely in the radiation treatment planning afterwards. The patient underwent planning CT which was fused to 3T high resolution MRI with 1 mm axial slices.  These images were fused on the planning system.  We contoured the gross target volumes and subsequently expanded this to yield the Planning Target Volume. I actively participated in the planning process.  I helped to define and review the target contours and also the contours of the optic pathway, eyes, brainstem and selected nearby organs at risk.  All the dose constraints for critical structures were reviewed and compared to AAPM Task Group 101.  The prescription dose conformity was reviewed.  I approved the plan electronically.    Accordingly, Edsel Petrin  was brought to the TrueBeam stereotactic radiation treatment linac and placed in the custom immobilization mask.  The patient was aligned according to the IR fiducial markers with BrainLab Exactrac, then orthogonal x-rays were used in ExacTrac with the 6DOF robotic table and the shifts were made to align the patient  Edsel Petrin received stereotactic radiosurgery to a prescription dose of 12.5Gy to the pituitary lesion uneventfully.    The detailed description of the procedure is recorded in the radiation oncology procedure note.  I was present for the duration of the procedure.  DISPOSITION:   Following delivery, the patient was transported to nursing in  stable condition and monitored for possible acute effects to be discharged to home in stable condition with follow-up in one month.  Consuella Lose, MD [facility]

## 2018-07-14 NOTE — Progress Notes (Signed)
  Radiation Oncology         [phone] ________________________________  Name: Adriana Jordan MRN: [id number]  Date: 07/03/2018  DOB: 1934-09-05  End of Treatment Note  Diagnosis:   Pituitary adenoma, progressive     Indication for treatment:  curative       Radiation treatment dates:   07/03/2018  Site/dose:   Brain, pituitary / 12.5 Gy in 1 fraction  Beams/energy:   SRS / 6 MV FFF photons   Narrative: The patient tolerated SRS treatment relatively well.   She did not develop any acute side effects with treatment.   Plan: The patient has completed radiation treatment. The patient will return to radiation oncology clinic for routine followup in one month. I advised them to call or return sooner if they have any questions or concerns related to their recovery or treatment.  -----------------------------------  Eppie Gibson, MD   This document serves as a record of services personally performed by Eppie Gibson, MD. It was created on her behalf by Arlyce Harman, a trained medical scribe. The creation of this record is based on the scribe's personal observations and the provider's statements to them. This document has been checked and approved by the attending provider.

## 2018-08-03 ENCOUNTER — Encounter: Payer: Self-pay | Admitting: Radiation Oncology

## 2018-08-03 NOTE — Progress Notes (Signed)
Ms. Presti presents for follow up of radiation completed 07/03/18 to her brain, pituitary area. She reports headaches to the right side of her head. The intensity varies. She has tried advil and alleve without relief. She denies fatigue. She tells me that "she is eating too much". She denies other concerns at this time.   BP (!) 146/76 (BP Location: Left Arm, Patient Position: Sitting)   Pulse 74   Temp 97.9 F (36.6 C) (Oral)   Resp 18   Ht 5\' 6"  (1.676 m)   Wt 154 lb 2 oz (69.9 kg)   LMP  (LMP Unknown)   SpO2 97%   BMI 24.88 kg/m    Wt Readings from Last 3 Encounters:  08/15/18 154 lb 2 oz (69.9 kg)  06/27/18 146 lb (66.2 kg)  06/20/18 151 lb 12.8 oz (68.9 kg)

## 2018-08-15 ENCOUNTER — Ambulatory Visit
Admission: RE | Admit: 2018-08-15 | Discharge: 2018-08-15 | Disposition: A | Discharge status: Home / Self Care | Payer: Medicare Other | Source: Ambulatory Visit | Attending: Radiation Oncology | Admitting: Radiation Oncology

## 2018-08-15 ENCOUNTER — Encounter: Payer: Self-pay | Admitting: Radiation Oncology

## 2018-08-15 ENCOUNTER — Other Ambulatory Visit: Payer: Self-pay

## 2018-08-15 VITALS — BP 146/76 | HR 74 | Temp 97.9°F | Resp 18 | Ht 66.0 in | Wt 154.1 lb

## 2018-08-15 DIAGNOSIS — D497 Neoplasm of unspecified behavior of endocrine glands and other parts of nervous system: Secondary | ICD-10-CM | POA: Insufficient documentation

## 2018-08-15 DIAGNOSIS — Z79899 Other long term (current) drug therapy: Secondary | ICD-10-CM | POA: Insufficient documentation

## 2018-08-15 DIAGNOSIS — D352 Benign neoplasm of pituitary gland: Secondary | ICD-10-CM | POA: Diagnosis not present

## 2018-08-15 HISTORY — DX: Personal history of irradiation: Z92.3

## 2018-08-15 NOTE — Progress Notes (Signed)
Radiation Oncology         (336) [phone] ________________________________  Name: Adriana Jordan MRN: [id number]  Date: 08/15/2018  DOB: [birth year]  Follow-Up Visit Note  Outpatient  CC: Dixie Dials, MD  Consuella Lose, MD  Diagnosis:   Pituitary Adenoma    ICD-10-CM   1. Pituitary adenoma ([place]) D35.2   2. Pituitary tumor D49.7      CHIEF COMPLAINT: Here for follow-up and surveillance of pituitary adenoma.  Interval Since Last Radiation:  6 weeks  07/03/18: Brain, pituitary / 12.5 Gy in 1 fractions  Narrative:  The patient returns today for routine follow-up. She was referred to Dr. Garnet Koyanagi in endocrinology on 08/03/18. Patient reports her thyroid level was low and she was prescribed medication.  Symptomatically, she denies seizures, nausea, new neurologic deficits, visual changes. Patient complains of right-sided headaches (preceded SRS) that vary in intensity. She has tried advil and alleve without relief. She notes these have been present since before radiation. She states her energy level is good.   ALLERGIES:  has No Known Allergies.  Meds: Current Outpatient Medications  Medication Sig Dispense Refill  . Artificial Tear Ointment (DRY EYES OP) Place 2 drops into both eyes daily as needed.    Marland Kitchen aspirin 81 MG tablet Take 81 mg by mouth daily.    Marland Kitchen losartan-hydrochlorothiazide (HYZAAR) 100-12.5 MG tablet Take 1 tablet by mouth daily. 30 tablet 3  . Multiple Vitamin (MULTIVITAMIN) tablet Take 1 tablet by mouth daily. Women    . omeprazole (PRILOSEC) 20 MG capsule Take 20 mg by mouth daily.    . simvastatin (ZOCOR) 40 MG tablet Take 40 mg by mouth daily.     Current Facility-Administered Medications  Medication Dose Route Frequency Provider Last Rate Last Dose  . triamcinolone acetonide (KENALOG) 10 MG/ML injection 10 mg  10 mg Other Once Landis Martins, DPM        Physical Findings: The patient is in no acute distress. Patient is alert and oriented.  height is  5\' 6"  (1.676 m) and weight is 154 lb 2 oz (69.9 kg). Her oral temperature is 97.9 F (36.6 C). Her blood pressure is 146/76 (abnormal) and her pulse is 74. Her respiration is 18 and oxygen saturation is 97%. .  No significant changes. General: Alert and oriented, in no acute distress HEENT: Head is normocephalic. Extraocular movements are intact.  Skin: No irritation to radiation site. Musculoskeletal: symmetric strength and muscle tone throughout. Neurologic: No obvious focalities. Speech is fluent. Coordination is intact. Visual quadrants intact. Psychiatric: Judgment and insight are intact. Affect is appropriate.   Lab Findings: Lab Results  Component Value Date   WBC 1.8 (L) 11/15/2017   HGB 11.0 (L) 11/15/2017   HCT 33.1 (L) 11/15/2017   MCV 92.7 11/15/2017   PLT 240 11/15/2017    @LASTCHEMISTRY @  Radiographic Findings: No results found.  Impression/Plan:  Pituitary Adenoma  We will arrange a follow up with Dr. Kathyrn Sheriff, and anticipate a brain MRI prior to the appointment, in 5 months. Pending Dr. Cleotilde Neer advice, she will alternate follow up with Dr. Kathyrn Sheriff and radiation oncology every 6 months or she will follow up with just his office. We will discuss at CNS tumor board.  Pt will discuss chronic HAs with PCP - unlikely to be related to adenoma.  She knows to call Mont Dutton if she desires referral to Dr Mickeal Skinner for symptom management.   I spent 15 minutes face to face with the patient and more than 50%  of that time was spent in counseling and/or coordination of care. _____________________________________   Eppie Gibson, MD  This document serves as a record of services personally performed by Eppie Gibson, MD. It was created on her behalf by Wilburn Mylar, a trained medical scribe. The creation of this record is based on the scribe's personal observations and the provider's statements to them. This document has been checked and approved by the attending  provider.

## 2018-08-16 ENCOUNTER — Encounter: Payer: Self-pay | Admitting: Radiation Oncology

## 2018-09-22 ENCOUNTER — Encounter: Payer: Self-pay | Admitting: Obstetrics & Gynecology

## 2018-11-06 ENCOUNTER — Other Ambulatory Visit: Payer: Self-pay | Admitting: Radiation Therapy

## 2018-11-06 DIAGNOSIS — D352 Benign neoplasm of pituitary gland: Secondary | ICD-10-CM

## 2019-02-28 ENCOUNTER — Other Ambulatory Visit: Payer: Self-pay | Admitting: Radiation Therapy

## 2019-02-28 ENCOUNTER — Ambulatory Visit
Admission: RE | Admit: 2019-02-28 | Discharge: 2019-02-28 | Disposition: A | Discharge status: Home / Self Care | Payer: Medicare Other | Source: Ambulatory Visit | Attending: Radiation Oncology | Admitting: Radiation Oncology

## 2019-02-28 DIAGNOSIS — D352 Benign neoplasm of pituitary gland: Secondary | ICD-10-CM

## 2019-02-28 MED ORDER — GADOBENATE DIMEGLUMINE 529 MG/ML IV SOLN
7.0000 mL | Freq: Once | INTRAVENOUS | Status: AC | PRN
  Administered 2019-02-28: 7 mL via INTRAVENOUS

## 2019-03-07 ENCOUNTER — Inpatient Hospital Stay: Payer: Medicare Other | Attending: Radiation Oncology

## 2019-04-24 ENCOUNTER — Other Ambulatory Visit: Payer: Self-pay | Admitting: Orthopaedic Surgery

## 2019-04-24 DIAGNOSIS — M19071 Primary osteoarthritis, right ankle and foot: Secondary | ICD-10-CM

## 2019-04-25 ENCOUNTER — Other Ambulatory Visit: Payer: Self-pay

## 2019-04-25 ENCOUNTER — Ambulatory Visit
Admission: RE | Admit: 2019-04-25 | Discharge: 2019-04-25 | Disposition: A | Discharge status: Home / Self Care | Payer: Medicare Other | Source: Ambulatory Visit | Attending: Orthopaedic Surgery | Admitting: Orthopaedic Surgery

## 2019-04-25 DIAGNOSIS — M19071 Primary osteoarthritis, right ankle and foot: Secondary | ICD-10-CM

## 2019-05-31 ENCOUNTER — Other Ambulatory Visit: Payer: Self-pay | Admitting: Radiation Therapy

## 2019-05-31 DIAGNOSIS — D352 Benign neoplasm of pituitary gland: Secondary | ICD-10-CM

## 2019-06-04 ENCOUNTER — Telehealth: Payer: Self-pay | Admitting: Radiation Therapy

## 2019-06-04 ENCOUNTER — Other Ambulatory Visit: Payer: Self-pay | Admitting: Radiation Therapy

## 2019-06-04 NOTE — Telephone Encounter (Signed)
I spoke with Adriana Jordan about her upcoming brain MRI and follow-up with Dr. Isidore Moos in Jan 2021. She is aware that we have been limiting patient exposures due to [employer] precautions and that the follow-up may be conducted via telephone rather than in person.  Mont Dutton R.T.(R)(T) Radiation Special Procedures Navigator

## 2019-08-30 ENCOUNTER — Other Ambulatory Visit: Payer: Medicare Other

## 2019-09-05 ENCOUNTER — Inpatient Hospital Stay: Payer: Medicare PPO

## 2019-09-05 ENCOUNTER — Ambulatory Visit: Payer: Medicare PPO | Admitting: Radiation Oncology

## 2019-09-17 ENCOUNTER — Ambulatory Visit: Payer: Medicare PPO | Admitting: Radiation Oncology

## 2019-09-19 ENCOUNTER — Ambulatory Visit: Payer: Medicare PPO | Admitting: Radiation Oncology

## 2019-09-20 ENCOUNTER — Other Ambulatory Visit: Payer: Medicare PPO

## 2019-09-24 ENCOUNTER — Ambulatory Visit
Admission: RE | Admit: 2019-09-24 | Discharge: 2019-09-24 | Disposition: A | Discharge status: Home / Self Care | Payer: Medicare PPO | Source: Ambulatory Visit | Attending: Radiation Oncology | Admitting: Radiation Oncology

## 2019-09-24 DIAGNOSIS — D352 Benign neoplasm of pituitary gland: Secondary | ICD-10-CM

## 2019-09-24 MED ORDER — GADOBENATE DIMEGLUMINE 529 MG/ML IV SOLN
7.0000 mL | Freq: Once | INTRAVENOUS | Status: AC | PRN
  Administered 2019-09-24: 7 mL via INTRAVENOUS

## 2019-09-25 ENCOUNTER — Ambulatory Visit
Admission: RE | Admit: 2019-09-25 | Discharge: 2019-09-25 | Disposition: A | Discharge status: Home / Self Care | Payer: Medicare PPO | Source: Ambulatory Visit | Attending: Radiation Oncology | Admitting: Radiation Oncology

## 2019-09-25 DIAGNOSIS — D497 Neoplasm of unspecified behavior of endocrine glands and other parts of nervous system: Secondary | ICD-10-CM

## 2019-09-25 DIAGNOSIS — D352 Benign neoplasm of pituitary gland: Secondary | ICD-10-CM

## 2019-09-25 NOTE — Progress Notes (Signed)
Radiation Oncology         (336) [phone] ________________________________  Name: Adriana Jordan MRN: [id number]  Date: 09/25/2019  DOB: 1935-05-13  Follow-Up Visit Note by telephone as patient was unable to access MyChart video during pandemic precautions   Outpatient  CC: Dixie Dials, MD  Consuella Lose, MD  Diagnosis:   Pituitary Adenoma    ICD-10-CM   1. Pituitary adenoma ([place])  D35.2      CHIEF COMPLAINT:   pituitary adenoma.  Interval Since Last Radiation:  1 year, 2 months, 3 weeks  07/03/18: Brain, pituitary / 12.5 Gy in 1 fractions  Narrative:  The patient returns today for routine follow-up and to review recent brain MRI. Performed yesterday, 09/24/2019, brain MRI showed: slight decrease in size of pituitary adenoma since 02/2019; small left frontal acute to subacute cortical infarct.  I have personally reviewed the images.  Denies any new symptoms.  Doing well overall.  Symptomatically, she is still coping with arthritis. She is not following with endocrinology -it is unclear why - she reports she is not taking thyroid supplements though in the past I believe that this was recommended.    ALLERGIES:  has No Known Allergies.  Meds: Current Outpatient Medications  Medication Sig Dispense Refill  . Artificial Tear Ointment (DRY EYES OP) Place 2 drops into both eyes daily as needed.    Marland Kitchen aspirin 81 MG tablet Take 81 mg by mouth daily.    Marland Kitchen losartan-hydrochlorothiazide (HYZAAR) 100-12.5 MG tablet Take 1 tablet by mouth daily. 30 tablet 3  . Multiple Vitamin (MULTIVITAMIN) tablet Take 1 tablet by mouth daily. Women    . omeprazole (PRILOSEC) 20 MG capsule Take 20 mg by mouth daily.    . simvastatin (ZOCOR) 40 MG tablet Take 40 mg by mouth daily.     Current Facility-Administered Medications  Medication Dose Route Frequency Provider Last Rate Last Admin  . triamcinolone acetonide (KENALOG) 10 MG/ML injection 10 mg  10 mg Other Once Landis Martins, DPM         Physical Findings: The patient is in no acute distress.    vitals were not taken for this visit. .    Lab Findings: Lab Results  Component Value Date   WBC 1.8 (L) 11/15/2017   HGB 11.0 (L) 11/15/2017   HCT 33.1 (L) 11/15/2017   MCV 92.7 11/15/2017   PLT 240 11/15/2017     Radiographic Findings: MR Brain W Wo Contrast  Result Date: 09/24/2019 CLINICAL DATA:  Pituitary adenoma post radiation EXAM: MRI HEAD WITHOUT AND WITH CONTRAST TECHNIQUE: Multiplanar, multiecho pulse sequences of the brain and surrounding structures were obtained without and with intravenous contrast. CONTRAST:  94mL MULTIHANCE GADOBENATE DIMEGLUMINE 529 MG/ML IV SOLN COMPARISON:  02/28/2019 FINDINGS: Brain: Pituitary mass measures slightly smaller at 13 x 20 x 9 mm (14 x 20 x 11 mm). As before, the infundibulum is mildly deviated to the right and contiguous with residual normal pituitary parenchyma. Invasion of the left cavernous sinus remains possible. There is no suprasellar extension. Optic chiasm is unremarkable. There is a small focus of cortical reduced diffusion involving the left middle frontal gyrus. Patchy and confluent T2 hyperintensity in the supratentorial white matter is nonspecific but probably reflects moderate chronic microvascular ischemic changes. Multiple small chronic cerebellar infarcts are again noted. Vascular: Major vessel flow voids at the skull base are preserved. Skull and upper cervical spine: Normal marrow signal is preserved. Sinuses/Orbits: Mild paranasal sinus mucosal thickening. Orbits are unremarkable. Other: Mastoid  air cells are clear. IMPRESSION: Slight decrease in size of pituitary adenoma since 02/28/2019. Small left frontal acute to subacute cortical infarct. Additional stable findings detailed above. Electronically Signed   By: Macy Mis M.D.   On: 09/24/2019 14:00    Impression/Plan:  Pituitary Adenoma -- no evidence of progression or recurrence.  She has not been  following with endocrinology and is at risk for hormonal depletion given her history.  She has a subacute to acute left frontal infarct incidentally seen on her latest MRI that is small in size.  I have conferred with Dr. Mickeal Skinner of neurology and he is happy to see her for follow-up and surveillance scans and also management of her endocrine labs and incidental infarct.  Mont Dutton, our patient navigator, will navigate this.  I will see her back on a as needed basis and follow her scans with the tumor board at our conferences.  This encounter was provided by telemedicine platform by telephone as patient opted for telemedicine was unable to access MyChart video during pandemic precautions The patient has given verbal consent for this type of encounter and has been advised to only accept a meeting of this type in a secure network environment. The time spent during this encounter on date of service, in total, was 30 minutes. The attendants for this meeting include Eppie Gibson  and Edsel Petrin.  During the encounter, Eppie Gibson was located at [facility] Radiation Oncology Department.  Edsel Petrin was located at home.   _____________________________________   Eppie Gibson, MD  This document serves as a record of services personally performed by Eppie Gibson, MD. It was created on her behalf by Wilburn Mylar, a trained medical scribe. The creation of this record is based on the scribe's personal observations and the provider's statements to them. This document has been checked and approved by the attending provider.

## 2019-09-26 ENCOUNTER — Encounter: Payer: Self-pay | Admitting: Radiation Oncology

## 2019-10-03 ENCOUNTER — Inpatient Hospital Stay: Payer: Medicare PPO | Attending: Radiation Oncology

## 2019-10-11 ENCOUNTER — Inpatient Hospital Stay: Payer: Medicare PPO | Attending: Internal Medicine | Admitting: Internal Medicine

## 2019-10-11 ENCOUNTER — Other Ambulatory Visit: Payer: Self-pay

## 2019-10-11 VITALS — BP 146/82 | HR 80 | Temp 98.0°F | Resp 20 | Ht 66.0 in | Wt 146.8 lb

## 2019-10-11 DIAGNOSIS — I2510 Atherosclerotic heart disease of native coronary artery without angina pectoris: Secondary | ICD-10-CM | POA: Diagnosis not present

## 2019-10-11 DIAGNOSIS — M129 Arthropathy, unspecified: Secondary | ICD-10-CM | POA: Diagnosis not present

## 2019-10-11 DIAGNOSIS — G8929 Other chronic pain: Secondary | ICD-10-CM | POA: Insufficient documentation

## 2019-10-11 DIAGNOSIS — Z85828 Personal history of other malignant neoplasm of skin: Secondary | ICD-10-CM | POA: Diagnosis not present

## 2019-10-11 DIAGNOSIS — D352 Benign neoplasm of pituitary gland: Secondary | ICD-10-CM | POA: Diagnosis not present

## 2019-10-11 DIAGNOSIS — Z87891 Personal history of nicotine dependence: Secondary | ICD-10-CM | POA: Diagnosis not present

## 2019-10-11 DIAGNOSIS — K219 Gastro-esophageal reflux disease without esophagitis: Secondary | ICD-10-CM | POA: Insufficient documentation

## 2019-10-11 DIAGNOSIS — Z79899 Other long term (current) drug therapy: Secondary | ICD-10-CM | POA: Insufficient documentation

## 2019-10-11 DIAGNOSIS — I639 Cerebral infarction, unspecified: Secondary | ICD-10-CM | POA: Diagnosis not present

## 2019-10-11 DIAGNOSIS — Z8673 Personal history of transient ischemic attack (TIA), and cerebral infarction without residual deficits: Secondary | ICD-10-CM | POA: Diagnosis not present

## 2019-10-11 DIAGNOSIS — E7800 Pure hypercholesterolemia, unspecified: Secondary | ICD-10-CM | POA: Diagnosis not present

## 2019-10-11 DIAGNOSIS — I10 Essential (primary) hypertension: Secondary | ICD-10-CM | POA: Insufficient documentation

## 2019-10-11 DIAGNOSIS — M545 Low back pain: Secondary | ICD-10-CM | POA: Diagnosis not present

## 2019-10-11 DIAGNOSIS — I252 Old myocardial infarction: Secondary | ICD-10-CM | POA: Diagnosis not present

## 2019-10-11 DIAGNOSIS — Z7982 Long term (current) use of aspirin: Secondary | ICD-10-CM | POA: Diagnosis not present

## 2019-10-11 NOTE — Progress Notes (Signed)
[facility] at [place], [place] [address] [phone]   New Patient Evaluation  Date of Service: 10/11/19 Patient Name: Adriana Jordan Patient MRN: [id number] Patient DOB: 07-25-1935 Provider: [place]ura Sellers, MD  Identifying Statement:  REGENE EAPEN is a 85 y.o. female with pituitary adenoma who presents for initial consultation and evaluation.    Referring Provider: Dixie Dials, MD [address] [place],  [place] [address]  Oncologic History: 05/21/15: Resection by Dr. Joya Salm 07/03/18: SRS with Dr. Isidore Moos following radiographic progression  History of Present Illness: The patient's records from the referring physician were obtained and reviewed and the patient interviewed to confirm this HPI.  Adriana Jordan presents to clinic for consultation and evaluation after incidental stroke identified on routine follow up MRI for treated pituitary adenoma.  She denies any focal neurologic symptoms or any stroke-like symptoms.  No seizures or headaches.  Continues to live independently with full functional status, walks upwards of 2 miles each day for exercise.   Medications: Current Outpatient Medications on File Prior to Visit  Medication Sig Dispense Refill  . Artificial Tear Ointment (DRY EYES OP) Place 2 drops into both eyes daily as needed.    Marland Kitchen aspirin 81 MG tablet Take 81 mg by mouth daily.    Marland Kitchen losartan-hydrochlorothiazide (HYZAAR) 100-12.5 MG tablet Take 1 tablet by mouth daily. 30 tablet 3  . Multiple Vitamin (MULTIVITAMIN) tablet Take 1 tablet by mouth daily. Women    . omeprazole (PRILOSEC) 20 MG capsule Take 20 mg by mouth daily.    . simvastatin (ZOCOR) 40 MG tablet Take 40 mg by mouth daily.     Current Facility-Administered Medications on File Prior to Visit  Medication Dose Route Frequency Provider Last Rate Last Admin  . triamcinolone acetonide (KENALOG) 10 MG/ML injection 10 mg  10 mg Other Once  Landis Martins, DPM        Allergies: No Known Allergies Past Medical History:  Past Medical History:  Diagnosis Date  . Arthritis    "all over" (11/14/2017)  . Brain tumor ([place])    "I still have it"; not cancer (11/14/2017)  . Chronic lower back pain   . Coronary artery disease    Dr. Doylene Canard  . GERD (gastroesophageal reflux disease)   . History of blood transfusion    "don't remember why" (11/14/2017)  . History of radiation therapy 07/03/2018   brain, pituitary/ 12.5 Gy in 1 fraction  . Hypercholesteremia   . Hypertension   . MI (myocardial infarction) ([place]) 2006  . PONV (postoperative nausea and vomiting)   . Skin cancer    "right forearm"   Past Surgical History:  Past Surgical History:  Procedure Laterality Date  . ANTERIOR CERVICAL DECOMP/DISCECTOMY FUSION    . BACK SURGERY    . CARPAL TUNNEL RELEASE Bilateral   . CORONARY ANGIOPLASTY     DES mid LAD and mid RCA 09/21/04  . CRANIOTOMY N/A 05/21/2015   Procedure: Transsphenoidal resection of pituitary tumor with Dr. Radene Journey for approach;  Surgeon: Leeroy Cha, MD;  Location: [facility]URO [facility]S;  Service: Neurosurgery;  Laterality: N/A;  Transsphenoidal resection of pituitary tumor with Dr. Radene Journey for approach  . SHOULDER OPEN ROTATOR CUFF REPAIR Bilateral   . SKIN CANCER EXCISION Right    forearm  . thumb surgery Right    placed srews to make straight  . THYROID SURGERY     "goiter removed"   Social  History:  Social History   Socioeconomic History  . Marital status: Divorced    Spouse name: Not on file  . Number of children: Not on file  . Years of education: Not on file  . Highest education level: Not on file  Occupational History  . Not on file  Tobacco Use  . Smoking status: Former Smoker    Packs/day: 0.12    Years: 5.00    Pack years: 0.60    Types: Cigarettes    Quit date: 04/26/1969    Years since quitting: 50.4  . Smokeless tobacco: Never Used  Substance and Sexual Activity  . Alcohol  use: Yes    Comment: 11/14/2017 "couple glasses of wine/year"  . Drug use: No  . Sexual activity: Not Currently    Comment: 1ST intercourse- 19, partners- 5,   Other Topics Concern  . Not on file  Social History Narrative  . Not on file   Social Determinants of Health   Financial Resource Strain:   . Difficulty of Paying Living Expenses: Not on file  Food Insecurity:   . Worried About Charity fundraiser in the Last Year: Not on file  . Ran Out of Food in the Last Year: Not on file  Transportation Needs:   . Lack of Transportation (Medical): Not on file  . Lack of Transportation (Non-Medical): Not on file  Physical Activity:   . Days of Exercise per Week: Not on file  . Minutes of Exercise per Session: Not on file  Stress:   . Feeling of Stress : Not on file  Social Connections:   . Frequency of Communication with Friends and Family: Not on file  . Frequency of Social Gatherings with Friends and Family: Not on file  . Attends Religious Services: Not on file  . Active Member of Clubs or Organizations: Not on file  . Attends Archivist Meetings: Not on file  . Marital Status: Not on file  Intimate Partner Violence:   . Fear of Current or Ex-Partner: Not on file  . Emotionally Abused: Not on file  . Physically Abused: Not on file  . Sexually Abused: Not on file   Family History:  Family History  Problem Relation Age of Onset  . Cancer Father 60       throat- smoker   . Breast cancer Sister 85  . Cancer Brother 35       lung- smoker    Review of Systems: Constitutional: Doesn't report fevers, chills or abnormal weight loss Eyes: Doesn't report blurriness of vision Ears, nose, mouth, throat, and face: Doesn't report sore throat Respiratory: Doesn't report cough, dyspnea or wheezes Cardiovascular: Doesn't report palpitation, chest discomfort  Gastrointestinal:  Doesn't report nausea, constipation, diarrhea GU: Doesn't report incontinence Skin: Doesn't report  skin rashes Neurological: Per HPI Musculoskeletal: Doesn't report joint pain Behavioral/Psych: Doesn't report anxiety  Physical Exam: Vitals:   10/11/19 1102  BP: (!) 146/82  Pulse: 80  Resp: 20  Temp: 98 F (36.7 C)  SpO2: 98%   KPS: 90. General: Alert, cooperative, pleasant, in no acute distress Head: Normal EENT: No conjunctival injection or scleral icterus.  Lungs: Resp effort normal Cardiac: Regular rate Abdomen: Non-distended abdomen Skin: No rashes cyanosis or petechiae. Extremities: No clubbing or edema  Neurologic Exam: Mental Status: Awake, alert, attentive to examiner. Oriented to self and environment. Language is fluent with intact comprehension.  Cranial Nerves: Visual acuity is grossly normal. Visual fields are full. Extra-ocular movements intact.  No ptosis. Face is symmetric Motor: Tone and bulk are normal. Power is full in both arms and legs. Reflexes are symmetric, no pathologic reflexes present.  Sensory: Intact to light touch Gait: Normal.   Labs: I have reviewed the data as listed    Component Value Date/Time   NA 135 11/15/2017 0439   K 3.4 (L) 11/15/2017 0439   CL 103 11/15/2017 0439   CO2 22 11/15/2017 0439   GLUCOSE 90 11/15/2017 0439   BUN 21 (H) 11/15/2017 0439   CREATININE 1.23 (H) 11/15/2017 0439   CALCIUM 8.7 (L) 11/15/2017 0439   PROT 7.2 11/14/2017 1250   ALBUMIN 3.7 11/14/2017 1250   AST 43 (H) 11/14/2017 1250   ALT 22 11/14/2017 1250   ALKPHOS 61 11/14/2017 1250   BILITOT 0.6 11/14/2017 1250   GFRNONAA 40 (L) 11/15/2017 0439   GFRAA 46 (L) 11/15/2017 0439   Lab Results  Component Value Date   WBC 1.8 (L) 11/15/2017   NEUTROABS 1.0 (L) 11/14/2017   HGB 11.0 (L) 11/15/2017   HCT 33.1 (L) 11/15/2017   MCV 92.7 11/15/2017   PLT 240 11/15/2017    Imaging:  MR Brain W Wo Contrast  Result Date: 09/24/2019 CLINICAL DATA:  Pituitary adenoma post radiation EXAM: MRI HEAD WITHOUT AND WITH CONTRAST TECHNIQUE: Multiplanar,  multiecho pulse sequences of the brain and surrounding structures were obtained without and with intravenous contrast. CONTRAST:  72mL MULTIHANCE GADOBENATE DIMEGLUMINE 529 MG/ML IV SOLN COMPARISON:  02/28/2019 FINDINGS: Brain: Pituitary mass measures slightly smaller at 13 x 20 x 9 mm (14 x 20 x 11 mm). As before, the infundibulum is mildly deviated to the right and contiguous with residual normal pituitary parenchyma. Invasion of the left cavernous sinus remains possible. There is no suprasellar extension. Optic chiasm is unremarkable. There is a small focus of cortical reduced diffusion involving the left middle frontal gyrus. Patchy and confluent T2 hyperintensity in the supratentorial white matter is nonspecific but probably reflects moderate chronic microvascular ischemic changes. Multiple small chronic cerebellar infarcts are again noted. Vascular: Major vessel flow voids at the skull base are preserved. Skull and upper cervical spine: Normal marrow signal is preserved. Sinuses/Orbits: Mild paranasal sinus mucosal thickening. Orbits are unremarkable. Other: Mastoid air cells are clear. IMPRESSION: Slight decrease in size of pituitary adenoma since 02/28/2019. Small left frontal acute to subacute cortical infarct. Additional stable findings detailed above. Electronically Signed   By: Macy Mis M.D.   On: 09/24/2019 14:00     Assessment/Plan Pituitary adenoma ([place]) [D35.2]  Stroke  We appreciate the opportunity to participate in the care of Edsel Petrin.  She presents today with asymptomatic infarct localized to left supplementary motor cortex.  The etiology is unclear, risk factors include age, hypertension, hyperlipidemia.  Based on periventricular leukomalacia, small vessel disease appears likely.  We recommended initiating routine stroke workup, including EKG, transthoracic echocardiogram, carotid duplex, and serum lipid panel.  Per patient, ultrasound, ECG and lipid panel have been  performed within past few months through her cardiologist Dr. Doylene Canard.  In setting of maximal medical therapy, carotid duplex may be deferred in if patient prefers given high risk for intervention such as endarterectomy.   Aspirin should be increased to full dose 325mg  daily due to "failure" of 81mg  dose level.  For now will continue simvastatin at current dose 40mg  daily.  We provided counseling regarding stroke risk factors, warning signs for acute stroke, and secondary prevention.  Pitutary adenoma is stable and can be followed  with repeat MRI in 6 months.  She should return to clinic in 4-6 weeks after completing workup for further review and management.  All questions were answered. The patient knows to call the clinic with any problems, questions or concerns. No barriers to learning were detected.  The total time spent in the encounter was 45 minutes and more than 50% was on counseling and review of test results   [place]ura Sellers, MD Medical Director of Neuro-Oncology [facility] [place] 10/11/19 10:59 AM

## 2019-10-12 ENCOUNTER — Telehealth: Payer: Self-pay | Admitting: Internal Medicine

## 2019-10-12 NOTE — Telephone Encounter (Signed)
Scheduled appt per 3/4 los.  Sent a message to HIM pool to get a calendar mailed out. 

## 2020-02-28 ENCOUNTER — Other Ambulatory Visit: Payer: Self-pay | Admitting: Radiation Therapy

## 2020-03-21 ENCOUNTER — Ambulatory Visit
Admission: RE | Admit: 2020-03-21 | Discharge: 2020-03-21 | Disposition: A | Discharge status: Home / Self Care | Payer: Medicare PPO | Source: Ambulatory Visit | Attending: Internal Medicine | Admitting: Internal Medicine

## 2020-03-21 ENCOUNTER — Other Ambulatory Visit: Payer: Self-pay

## 2020-03-21 DIAGNOSIS — D352 Benign neoplasm of pituitary gland: Secondary | ICD-10-CM

## 2020-03-21 MED ORDER — GADOBENATE DIMEGLUMINE 529 MG/ML IV SOLN
7.0000 mL | Freq: Once | INTRAVENOUS | Status: AC | PRN
  Administered 2020-03-21: 7 mL via INTRAVENOUS

## 2020-03-24 ENCOUNTER — Inpatient Hospital Stay: Payer: Medicare PPO | Attending: Internal Medicine

## 2020-03-24 DIAGNOSIS — I252 Old myocardial infarction: Secondary | ICD-10-CM | POA: Insufficient documentation

## 2020-03-24 DIAGNOSIS — K219 Gastro-esophageal reflux disease without esophagitis: Secondary | ICD-10-CM | POA: Insufficient documentation

## 2020-03-24 DIAGNOSIS — Z79899 Other long term (current) drug therapy: Secondary | ICD-10-CM | POA: Insufficient documentation

## 2020-03-24 DIAGNOSIS — Z85828 Personal history of other malignant neoplasm of skin: Secondary | ICD-10-CM | POA: Insufficient documentation

## 2020-03-24 DIAGNOSIS — Z87891 Personal history of nicotine dependence: Secondary | ICD-10-CM | POA: Insufficient documentation

## 2020-03-24 DIAGNOSIS — I10 Essential (primary) hypertension: Secondary | ICD-10-CM | POA: Insufficient documentation

## 2020-03-24 DIAGNOSIS — Z923 Personal history of irradiation: Secondary | ICD-10-CM | POA: Insufficient documentation

## 2020-03-24 DIAGNOSIS — E7800 Pure hypercholesterolemia, unspecified: Secondary | ICD-10-CM | POA: Insufficient documentation

## 2020-03-24 DIAGNOSIS — I2510 Atherosclerotic heart disease of native coronary artery without angina pectoris: Secondary | ICD-10-CM | POA: Insufficient documentation

## 2020-03-24 DIAGNOSIS — D352 Benign neoplasm of pituitary gland: Secondary | ICD-10-CM | POA: Insufficient documentation

## 2020-03-24 DIAGNOSIS — Z7982 Long term (current) use of aspirin: Secondary | ICD-10-CM | POA: Insufficient documentation

## 2020-03-24 DIAGNOSIS — Z8673 Personal history of transient ischemic attack (TIA), and cerebral infarction without residual deficits: Secondary | ICD-10-CM | POA: Insufficient documentation

## 2020-03-24 DIAGNOSIS — M129 Arthropathy, unspecified: Secondary | ICD-10-CM | POA: Insufficient documentation

## 2020-03-25 ENCOUNTER — Inpatient Hospital Stay: Payer: Medicare PPO | Admitting: Internal Medicine

## 2020-03-25 ENCOUNTER — Other Ambulatory Visit: Payer: Self-pay

## 2020-03-25 VITALS — BP 158/85 | HR 71 | Temp 97.4°F | Resp 18 | Ht 66.0 in | Wt 145.0 lb

## 2020-03-25 DIAGNOSIS — K219 Gastro-esophageal reflux disease without esophagitis: Secondary | ICD-10-CM | POA: Diagnosis not present

## 2020-03-25 DIAGNOSIS — D352 Benign neoplasm of pituitary gland: Secondary | ICD-10-CM

## 2020-03-25 DIAGNOSIS — Z87891 Personal history of nicotine dependence: Secondary | ICD-10-CM | POA: Diagnosis not present

## 2020-03-25 DIAGNOSIS — I10 Essential (primary) hypertension: Secondary | ICD-10-CM | POA: Diagnosis not present

## 2020-03-25 DIAGNOSIS — M129 Arthropathy, unspecified: Secondary | ICD-10-CM | POA: Diagnosis not present

## 2020-03-25 DIAGNOSIS — Z79899 Other long term (current) drug therapy: Secondary | ICD-10-CM | POA: Diagnosis not present

## 2020-03-25 DIAGNOSIS — Z923 Personal history of irradiation: Secondary | ICD-10-CM | POA: Diagnosis not present

## 2020-03-25 DIAGNOSIS — E7800 Pure hypercholesterolemia, unspecified: Secondary | ICD-10-CM | POA: Diagnosis not present

## 2020-03-25 DIAGNOSIS — Z7982 Long term (current) use of aspirin: Secondary | ICD-10-CM | POA: Diagnosis not present

## 2020-03-25 DIAGNOSIS — Z8673 Personal history of transient ischemic attack (TIA), and cerebral infarction without residual deficits: Secondary | ICD-10-CM | POA: Diagnosis not present

## 2020-03-25 DIAGNOSIS — Z85828 Personal history of other malignant neoplasm of skin: Secondary | ICD-10-CM | POA: Diagnosis not present

## 2020-03-25 DIAGNOSIS — I252 Old myocardial infarction: Secondary | ICD-10-CM | POA: Diagnosis not present

## 2020-03-25 DIAGNOSIS — I2510 Atherosclerotic heart disease of native coronary artery without angina pectoris: Secondary | ICD-10-CM | POA: Diagnosis not present

## 2020-03-25 NOTE — Progress Notes (Signed)
[facility] at [place], [place] [address] [phone]   Interval Evaluation  Date of Service: 03/25/20 Patient Name: Adriana Jordan Patient MRN: [id number] Patient DOB: 23-Feb-1935 Provider: Ventura Sellers, MD  Identifying Statement:  Adriana Jordan is a 85 y.o. female with pituitary adenoma   Oncologic History: 05/21/15: Resection by Dr. Joya Salm 07/03/18: [place] with Dr. Isidore Moos following radiographic progression  Interval History: [name] presents to clinic today for follow up after recent MRI brain.  She describes no new or progressive neurologic deficits.  No headaches or visual impairment.  No recurrence of stroke symptoms.  H+P (10/11/19) Patient presents to clinic for consultation and evaluation after incidental stroke identified on routine follow up MRI for treated pituitary adenoma.  She denies any focal neurologic symptoms or any stroke-like symptoms.  No seizures or headaches.  Continues to live independently with full functional status, walks upwards of 2 miles each day for exercise.   Medications: Current Outpatient Medications on File Prior to Visit  Medication Sig Dispense Refill  . Artificial Tear Ointment (DRY EYES OP) Place 2 drops into both eyes daily as needed.    Marland Kitchen aspirin 325 MG EC tablet Take 325 mg by mouth daily.    Marland Kitchen losartan-hydrochlorothiazide (HYZAAR) 100-12.5 MG tablet Take 1 tablet by mouth daily. 30 tablet 3  . Multiple Vitamin (MULTIVITAMIN) tablet Take 1 tablet by mouth daily. Women    . omeprazole (PRILOSEC) 20 MG capsule Take 20 mg by mouth daily.    . simvastatin (ZOCOR) 40 MG tablet Take 40 mg by mouth daily.     Current Facility-Administered Medications on File Prior to Visit  Medication Dose Route Frequency Provider Last Rate Last Admin  . triamcinolone acetonide (KENALOG) 10 MG/ML injection 10 mg  10 mg Other Once Landis Martins, DPM        Allergies: No Known Allergies Past  Medical History:  Past Medical History:  Diagnosis Date  . Arthritis    "all over" (11/14/2017)  . Brain tumor ([place])    "I still have it"; not cancer (11/14/2017)  . Chronic lower back pain   . Coronary artery disease    Dr. Doylene Canard  . GERD (gastroesophageal reflux disease)   . History of blood transfusion    "don't remember why" (11/14/2017)  . History of radiation therapy 07/03/2018   brain, pituitary/ 12.5 Gy in 1 fraction  . Hypercholesteremia   . Hypertension   . MI (myocardial infarction) ([place]) 2006  . PONV (postoperative nausea and vomiting)   . Skin cancer    "right forearm"   Past Surgical History:  Past Surgical History:  Procedure Laterality Date  . ANTERIOR CERVICAL DECOMP/DISCECTOMY FUSION    . BACK SURGERY    . CARPAL TUNNEL RELEASE Bilateral   . CORONARY ANGIOPLASTY     DES mid LAD and mid RCA 09/21/04  . CRANIOTOMY N/A 05/21/2015   Procedure: Transsphenoidal resection of pituitary tumor with Dr. Radene Journey for approach;  Surgeon: Leeroy Cha, MD;  Location: [facility] NEURO ORS;  Service: Neurosurgery;  Laterality: N/A;  Transsphenoidal resection of pituitary tumor with Dr. Radene Journey for approach  . SHOULDER OPEN ROTATOR CUFF REPAIR Bilateral   . SKIN CANCER EXCISION Right    forearm  . thumb surgery Right    placed srews to make straight  . THYROID SURGERY     "goiter removed"   Social History:  Social History   Socioeconomic History  .  Marital status: Divorced    Spouse name: Not on file  . Number of children: Not on file  . Years of education: Not on file  . Highest education level: Not on file  Occupational History  . Not on file  Tobacco Use  . Smoking status: Former Smoker    Packs/day: 0.12    Years: 5.00    Pack years: 0.60    Types: Cigarettes    Quit date: 04/26/1969    Years since quitting: 50.9  . Smokeless tobacco: Never Used  Vaping Use  . Vaping Use: Never used  Substance and Sexual Activity  . Alcohol use: Yes    Comment:  11/14/2017 "couple glasses of wine/year"  . Drug use: No  . Sexual activity: Not Currently    Comment: 1ST intercourse- 19, partners- 5,   Other Topics Concern  . Not on file  Social History Narrative  . Not on file   Social Determinants of Health   Financial Resource Strain:   . Difficulty of Paying Living Expenses:   Food Insecurity:   . Worried About Charity fundraiser in the Last Year:   . Arboriculturist in the Last Year:   Transportation Needs:   . Film/video editor (Medical):   Marland Kitchen Lack of Transportation (Non-Medical):   Physical Activity:   . Days of Exercise per Week:   . Minutes of Exercise per Session:   Stress:   . Feeling of Stress :   Social Connections:   . Frequency of Communication with Friends and Family:   . Frequency of Social Gatherings with Friends and Family:   . Attends Religious Services:   . Active Member of Clubs or Organizations:   . Attends Archivist Meetings:   Marland Kitchen Marital Status:   Intimate Partner Violence:   . Fear of Current or Ex-Partner:   . Emotionally Abused:   Marland Kitchen Physically Abused:   . Sexually Abused:    Family History:  Family History  Problem Relation Age of Onset  . Cancer Father 60       throat- smoker   . Breast cancer Sister 10  . Cancer Brother 35       lung- smoker    Review of Systems: Constitutional: Doesn't report fevers, chills or abnormal weight loss Eyes: Doesn't report blurriness of vision Ears, nose, mouth, throat, and face: Doesn't report sore throat Respiratory: Doesn't report cough, dyspnea or wheezes Cardiovascular: Doesn't report palpitation, chest discomfort  Gastrointestinal:  Doesn't report nausea, constipation, diarrhea GU: Doesn't report incontinence Skin: Doesn't report skin rashes Neurological: Per HPI Musculoskeletal: Doesn't report joint pain Behavioral/Psych: Doesn't report anxiety  Physical Exam: Vitals:   03/25/20 1124  BP: (!) 158/85  Pulse: 71  Resp: 18  Temp: (!)  97.4 F (36.3 C)  SpO2: 95%   KPS: 90. General: Alert, cooperative, pleasant, in no acute distress Head: Normal EENT: No conjunctival injection or scleral icterus.  Lungs: Resp effort normal Cardiac: Regular rate Abdomen: Non-distended abdomen Skin: No rashes cyanosis or petechiae. Extremities: No clubbing or edema  Neurologic Exam: Mental Status: Awake, alert, attentive to examiner. Oriented to self and environment. Language is fluent with intact comprehension.  Cranial Nerves: Visual acuity is grossly normal. Visual fields are full. Extra-ocular movements intact. No ptosis. Face is symmetric Motor: Tone and bulk are normal. Power is full in both arms and legs. Reflexes are symmetric, no pathologic reflexes present.  Sensory: Intact to light touch Gait: Normal.  Labs: I have reviewed the data as listed    Component Value Date/Time   NA 135 11/15/2017 0439   K 3.4 (L) 11/15/2017 0439   CL 103 11/15/2017 0439   CO2 22 11/15/2017 0439   GLUCOSE 90 11/15/2017 0439   BUN 21 (H) 11/15/2017 0439   CREATININE 1.23 (H) 11/15/2017 0439   CALCIUM 8.7 (L) 11/15/2017 0439   PROT 7.2 11/14/2017 1250   ALBUMIN 3.7 11/14/2017 1250   AST 43 (H) 11/14/2017 1250   ALT 22 11/14/2017 1250   ALKPHOS 61 11/14/2017 1250   BILITOT 0.6 11/14/2017 1250   GFRNONAA 40 (L) 11/15/2017 0439   GFRAA 46 (L) 11/15/2017 0439   Lab Results  Component Value Date   WBC 1.8 (L) 11/15/2017   NEUTROABS 1.0 (L) 11/14/2017   HGB 11.0 (L) 11/15/2017   HCT 33.1 (L) 11/15/2017   MCV 92.7 11/15/2017   PLT 240 11/15/2017    Imaging:  [facility] Clinician Interpretation: I have personally reviewed the CNS images as listed.  My interpretation, in the context of the patient's clinical presentation, is stable disease  MR Brain W Wo Contrast  Result Date: 03/21/2020 CLINICAL DATA:  Assess treatment response pituitary adenoma. History of trans-sphenoidal resection 05/21/2015. Radiation therapy pituitary 07/03/2018  EXAM: MRI HEAD WITHOUT AND WITH CONTRAST TECHNIQUE: Multiplanar, multiecho pulse sequences of the brain and surrounding structures were obtained without and with intravenous contrast. CONTRAST:  72mL MULTIHANCE GADOBENATE DIMEGLUMINE 529 MG/ML IV SOLN COMPARISON:  MRI head 09/24/2019 FINDINGS: Brain: Pituitary mass lesion is stable measuring approximately 20 x 15 x 9 mm. This shows heterogeneous enhancement. There is deviation of the pituitary and infundibulum to the right. There is extension of tumor into the left cavernous sinus unchanged. No suprasellar extension and no compression of the optic chiasm. Generalized atrophy with patchy white matter changes bilaterally which are stable. Negative for acute infarct. Chronic microhemorrhage right cerebellum. Vascular: Normal arterial flow voids. Skull and upper cervical spine: No focal skeletal lesion. Sinuses/Orbits: Mild mucosal edema paranasal sinuses. Prior trans-sphenoidal resection of pituitary. Negative orbit Other: None IMPRESSION: Stable pituitary macro adenoma. There remains mild extension into the left cavernous sinus. No extension into the suprasellar cistern. Atrophy and moderate chronic microvascular ischemic change in the white matter. No acute infarct. Electronically Signed   By: Franchot Gallo M.D.   On: 03/21/2020 10:38   Assessment/Plan Pituitary adenoma [facility]) [D35.2]  Stroke  KIMBERLA DRISKILL is clinically and radiographically stable today.  Overall very good response to radiotherapy.  Recommend continuing ASA at 325 daily and Crestor 20mg  daily for secondary stroke prevention.    We ask that ITATI BROCKSMITH return to clinic in 12 months following next brain MRI, or sooner as needed.  All questions were answered. The patient knows to call the clinic with any problems, questions or concerns. No barriers to learning were detected.  The total time spent in the encounter was 30 minutes and more than 50% was on counseling and review of  test results   [place]entura Sellers, MD Medical Director of Neuro-Oncology [facility] 03/25/20 11:34 AM

## 2020-03-26 ENCOUNTER — Telehealth: Payer: Self-pay | Admitting: Internal Medicine

## 2020-03-26 NOTE — Telephone Encounter (Signed)
Scheduled appointment per 8/17 los. Patient is aware of appointment date and time.

## 2020-05-05 ENCOUNTER — Encounter: Payer: Self-pay | Admitting: Obstetrics & Gynecology

## 2020-05-05 DIAGNOSIS — Z0289 Encounter for other administrative examinations: Secondary | ICD-10-CM

## 2020-06-18 DIAGNOSIS — K222 Esophageal obstruction: Secondary | ICD-10-CM | POA: Diagnosis not present

## 2020-06-18 DIAGNOSIS — K5730 Diverticulosis of large intestine without perforation or abscess without bleeding: Secondary | ICD-10-CM | POA: Diagnosis not present

## 2020-06-18 DIAGNOSIS — K219 Gastro-esophageal reflux disease without esophagitis: Secondary | ICD-10-CM | POA: Diagnosis not present

## 2020-07-02 DIAGNOSIS — I10 Essential (primary) hypertension: Secondary | ICD-10-CM | POA: Diagnosis not present

## 2020-07-02 DIAGNOSIS — M25541 Pain in joints of right hand: Secondary | ICD-10-CM | POA: Diagnosis not present

## 2020-07-02 DIAGNOSIS — R0602 Shortness of breath: Secondary | ICD-10-CM | POA: Diagnosis not present

## 2020-07-02 DIAGNOSIS — I2510 Atherosclerotic heart disease of native coronary artery without angina pectoris: Secondary | ICD-10-CM | POA: Diagnosis not present

## 2020-07-17 ENCOUNTER — Ambulatory Visit (INDEPENDENT_AMBULATORY_CARE_PROVIDER_SITE_OTHER): Payer: Medicare PPO | Admitting: Obstetrics & Gynecology

## 2020-07-17 ENCOUNTER — Other Ambulatory Visit: Payer: Self-pay

## 2020-07-17 ENCOUNTER — Encounter: Payer: Self-pay | Admitting: Obstetrics & Gynecology

## 2020-07-17 VITALS — BP 114/70 | Ht 63.0 in | Wt 156.0 lb

## 2020-07-17 DIAGNOSIS — Z01419 Encounter for gynecological examination (general) (routine) without abnormal findings: Secondary | ICD-10-CM | POA: Diagnosis not present

## 2020-07-17 DIAGNOSIS — Z780 Asymptomatic menopausal state: Secondary | ICD-10-CM

## 2020-07-17 NOTE — Progress Notes (Signed)
Adriana Jordan 09-02-1934 [id number]   History:    85 y.o. G1P1L1 Abstinent  RP:  Established patient presenting for annual gyn exam   HPI:  Postmenopause.  No HRT.  No PMB.  No pelvic pain.  Followed by Cardio/Labs.  H/O Benign Brain tumor excised.  H/O small strokes which patient didn't notice.  Breasts wnl.  Mictions/BMs normal. BMI 27.63. Walks 1 1/2 mile every day.  Good nutrition.  Drinks milk.  Vit D supplements.   Past medical history,surgical history, family history and social history were all reviewed and documented in the EPIC chart.  Gynecologic History No LMP recorded (lmp unknown). Patient is postmenopausal.  Obstetric History OB History  Gravida Para Term Preterm AB Living  1 1       1   SAB IAB Ectopic Multiple Live Births               # Outcome Date GA Lbr Len/2nd Weight Sex Delivery Anes PTL Lv  1 Para              ROS: A ROS was performed and pertinent positives and negatives are included in the history.  GENERAL: No fevers or chills. HEENT: No change in vision, no earache, sore throat or sinus congestion. NECK: No pain or stiffness. CARDIOVASCULAR: No chest pain or pressure. No palpitations. PULMONARY: No shortness of breath, cough or wheeze. GASTROINTESTINAL: No abdominal pain, nausea, vomiting or diarrhea, melena or bright red blood per rectum. GENITOURINARY: No urinary frequency, urgency, hesitancy or dysuria. MUSCULOSKELETAL: No joint or muscle pain, no back pain, no recent trauma. DERMATOLOGIC: No rash, no itching, no lesions. ENDOCRINE: No polyuria, polydipsia, no heat or cold intolerance. No recent change in weight. HEMATOLOGICAL: No anemia or easy bruising or bleeding. NEUROLOGIC: No headache, seizures, numbness, tingling or weakness. PSYCHIATRIC: No depression, no loss of interest in normal activity or change in sleep pattern.     Exam:   BP 114/70   Ht 5\' 3"  (1.6 m)   Wt 156 lb (70.8 kg)   LMP  (LMP Unknown)   BMI 27.63 kg/m   Body mass  index is 27.63 kg/m.  General appearance : Well developed well nourished female. No acute distress HEENT: Eyes: no retinal hemorrhage or exudates,  Neck supple, trachea midline, no carotid bruits, no thyroidmegaly Lungs: Clear to auscultation, no rhonchi or wheezes, or rib retractions  Heart: Regular rate and rhythm, no murmurs or gallops Breast:Examined in sitting and supine position were symmetrical in appearance, no palpable masses or tenderness,  no skin retraction, no nipple inversion, no nipple discharge, no skin discoloration, no axillary or supraclavicular lymphadenopathy Abdomen: no palpable masses or tenderness, no rebound or guarding Extremities: no edema or skin discoloration or tenderness  Pelvic: Vulva: Normal             Vagina: No gross lesions or discharge  Cervix: No gross lesions or discharge  Uterus  AV, normal size, shape and consistency, non-tender and mobile  Adnexa  Without masses or tenderness  Anus: Normal   Assessment/Plan:  85 y.o. female for annual exam   1. Well female exam with routine gynecological exam Normal gynecologic exam in postmenopausal.  No indication for a Pap test at this time.  Breast exam normal.  Screening mammogram April 2021 was negative.  Colonoscopy 2016.  Health labs with family physician.  Body mass index 27.63.  Continue with daily walks and healthy nutrition.  2. Postmenopause Well on no hormone replacement therapy.  No postmenopausal bleeding.  Bone density with family physician as needed.  Princess Bruins MD, 11:42 AM 07/17/2020

## 2020-07-21 DIAGNOSIS — E876 Hypokalemia: Secondary | ICD-10-CM | POA: Diagnosis not present

## 2020-07-21 DIAGNOSIS — E7849 Other hyperlipidemia: Secondary | ICD-10-CM | POA: Diagnosis not present

## 2020-07-21 DIAGNOSIS — D649 Anemia, unspecified: Secondary | ICD-10-CM | POA: Diagnosis not present

## 2020-07-21 DIAGNOSIS — Z79899 Other long term (current) drug therapy: Secondary | ICD-10-CM | POA: Diagnosis not present

## 2020-08-28 DIAGNOSIS — M5186 Other intervertebral disc disorders, lumbar region: Secondary | ICD-10-CM | POA: Diagnosis not present

## 2020-08-28 DIAGNOSIS — R0602 Shortness of breath: Secondary | ICD-10-CM | POA: Diagnosis not present

## 2020-08-28 DIAGNOSIS — R072 Precordial pain: Secondary | ICD-10-CM | POA: Diagnosis not present

## 2020-08-28 DIAGNOSIS — I2510 Atherosclerotic heart disease of native coronary artery without angina pectoris: Secondary | ICD-10-CM | POA: Diagnosis not present

## 2020-10-20 DIAGNOSIS — M47816 Spondylosis without myelopathy or radiculopathy, lumbar region: Secondary | ICD-10-CM | POA: Diagnosis not present

## 2020-10-20 DIAGNOSIS — M25512 Pain in left shoulder: Secondary | ICD-10-CM | POA: Diagnosis not present

## 2020-10-27 DIAGNOSIS — R0602 Shortness of breath: Secondary | ICD-10-CM | POA: Diagnosis not present

## 2020-10-27 DIAGNOSIS — R072 Precordial pain: Secondary | ICD-10-CM | POA: Diagnosis not present

## 2020-10-27 DIAGNOSIS — I2510 Atherosclerotic heart disease of native coronary artery without angina pectoris: Secondary | ICD-10-CM | POA: Diagnosis not present

## 2020-10-27 DIAGNOSIS — M5186 Other intervertebral disc disorders, lumbar region: Secondary | ICD-10-CM | POA: Diagnosis not present

## 2020-11-10 DIAGNOSIS — M47816 Spondylosis without myelopathy or radiculopathy, lumbar region: Secondary | ICD-10-CM | POA: Diagnosis not present

## 2020-11-10 DIAGNOSIS — M25511 Pain in right shoulder: Secondary | ICD-10-CM | POA: Diagnosis not present

## 2020-11-27 DIAGNOSIS — R072 Precordial pain: Secondary | ICD-10-CM | POA: Diagnosis not present

## 2020-11-27 DIAGNOSIS — I2510 Atherosclerotic heart disease of native coronary artery without angina pectoris: Secondary | ICD-10-CM | POA: Diagnosis not present

## 2020-11-27 DIAGNOSIS — R0602 Shortness of breath: Secondary | ICD-10-CM | POA: Diagnosis not present

## 2020-11-27 DIAGNOSIS — M5186 Other intervertebral disc disorders, lumbar region: Secondary | ICD-10-CM | POA: Diagnosis not present

## 2020-12-09 DIAGNOSIS — R0602 Shortness of breath: Secondary | ICD-10-CM | POA: Diagnosis not present

## 2020-12-09 DIAGNOSIS — R072 Precordial pain: Secondary | ICD-10-CM | POA: Diagnosis not present

## 2020-12-09 DIAGNOSIS — I2510 Atherosclerotic heart disease of native coronary artery without angina pectoris: Secondary | ICD-10-CM | POA: Diagnosis not present

## 2020-12-09 DIAGNOSIS — M5186 Other intervertebral disc disorders, lumbar region: Secondary | ICD-10-CM | POA: Diagnosis not present

## 2020-12-10 ENCOUNTER — Other Ambulatory Visit (HOSPITAL_COMMUNITY): Payer: Self-pay | Admitting: Cardiovascular Disease

## 2020-12-10 DIAGNOSIS — I25110 Atherosclerotic heart disease of native coronary artery with unstable angina pectoris: Secondary | ICD-10-CM

## 2020-12-11 DIAGNOSIS — M19071 Primary osteoarthritis, right ankle and foot: Secondary | ICD-10-CM | POA: Diagnosis not present

## 2020-12-11 DIAGNOSIS — M79671 Pain in right foot: Secondary | ICD-10-CM | POA: Diagnosis not present

## 2020-12-12 ENCOUNTER — Other Ambulatory Visit: Payer: Self-pay

## 2020-12-12 ENCOUNTER — Encounter (HOSPITAL_COMMUNITY)
Admission: RE | Admit: 2020-12-12 | Discharge: 2020-12-12 | Disposition: A | Discharge status: Home / Self Care | Payer: Medicare PPO | Source: Ambulatory Visit | Attending: Cardiovascular Disease | Admitting: Cardiovascular Disease

## 2020-12-12 DIAGNOSIS — R0602 Shortness of breath: Secondary | ICD-10-CM | POA: Diagnosis not present

## 2020-12-12 DIAGNOSIS — R072 Precordial pain: Secondary | ICD-10-CM | POA: Diagnosis not present

## 2020-12-12 DIAGNOSIS — I10 Essential (primary) hypertension: Secondary | ICD-10-CM | POA: Diagnosis not present

## 2020-12-12 DIAGNOSIS — I2510 Atherosclerotic heart disease of native coronary artery without angina pectoris: Secondary | ICD-10-CM | POA: Diagnosis not present

## 2020-12-12 DIAGNOSIS — I25110 Atherosclerotic heart disease of native coronary artery with unstable angina pectoris: Secondary | ICD-10-CM | POA: Insufficient documentation

## 2020-12-12 DIAGNOSIS — I252 Old myocardial infarction: Secondary | ICD-10-CM | POA: Diagnosis not present

## 2020-12-12 DIAGNOSIS — M5186 Other intervertebral disc disorders, lumbar region: Secondary | ICD-10-CM | POA: Diagnosis not present

## 2020-12-12 MED ORDER — TECHNETIUM TC 99M TETROFOSMIN IV KIT
10.6000 | PACK | Freq: Once | INTRAVENOUS | Status: AC | PRN
  Administered 2020-12-12: 10.6 via INTRAVENOUS

## 2020-12-12 MED ORDER — REGADENOSON 0.4 MG/5ML IV SOLN
INTRAVENOUS | Status: AC
  Administered 2020-12-12: 0.4 mg via INTRAVENOUS
  Filled 2020-12-12: qty 5

## 2020-12-12 MED ORDER — REGADENOSON 0.4 MG/5ML IV SOLN: 0.4000 mg | Freq: Once | INTRAVENOUS | Status: AC

## 2020-12-23 DIAGNOSIS — Z1231 Encounter for screening mammogram for malignant neoplasm of breast: Secondary | ICD-10-CM | POA: Diagnosis not present

## 2020-12-25 DIAGNOSIS — M25512 Pain in left shoulder: Secondary | ICD-10-CM | POA: Diagnosis not present

## 2020-12-25 DIAGNOSIS — I10 Essential (primary) hypertension: Secondary | ICD-10-CM | POA: Diagnosis not present

## 2020-12-25 DIAGNOSIS — M25571 Pain in right ankle and joints of right foot: Secondary | ICD-10-CM | POA: Diagnosis not present

## 2020-12-25 DIAGNOSIS — R0602 Shortness of breath: Secondary | ICD-10-CM | POA: Diagnosis not present

## 2020-12-25 DIAGNOSIS — I2510 Atherosclerotic heart disease of native coronary artery without angina pectoris: Secondary | ICD-10-CM | POA: Diagnosis not present

## 2021-01-13 DIAGNOSIS — R110 Nausea: Secondary | ICD-10-CM | POA: Diagnosis not present

## 2021-01-13 DIAGNOSIS — E785 Hyperlipidemia, unspecified: Secondary | ICD-10-CM | POA: Diagnosis not present

## 2021-01-13 DIAGNOSIS — I10 Essential (primary) hypertension: Secondary | ICD-10-CM | POA: Diagnosis not present

## 2021-01-21 DIAGNOSIS — R1084 Generalized abdominal pain: Secondary | ICD-10-CM | POA: Diagnosis not present

## 2021-01-21 DIAGNOSIS — K591 Functional diarrhea: Secondary | ICD-10-CM | POA: Diagnosis not present

## 2021-01-21 DIAGNOSIS — R5383 Other fatigue: Secondary | ICD-10-CM | POA: Diagnosis not present

## 2021-01-21 DIAGNOSIS — R197 Diarrhea, unspecified: Secondary | ICD-10-CM | POA: Diagnosis not present

## 2021-01-21 DIAGNOSIS — R110 Nausea: Secondary | ICD-10-CM | POA: Diagnosis not present

## 2021-01-27 DIAGNOSIS — R5382 Chronic fatigue, unspecified: Secondary | ICD-10-CM | POA: Diagnosis not present

## 2021-01-27 DIAGNOSIS — R944 Abnormal results of kidney function studies: Secondary | ICD-10-CM | POA: Diagnosis not present

## 2021-01-28 DIAGNOSIS — R110 Nausea: Secondary | ICD-10-CM | POA: Diagnosis not present

## 2021-01-28 DIAGNOSIS — R197 Diarrhea, unspecified: Secondary | ICD-10-CM | POA: Diagnosis not present

## 2021-02-27 DIAGNOSIS — R110 Nausea: Secondary | ICD-10-CM | POA: Diagnosis not present

## 2021-02-27 DIAGNOSIS — R071 Chest pain on breathing: Secondary | ICD-10-CM | POA: Diagnosis not present

## 2021-02-27 DIAGNOSIS — R1030 Lower abdominal pain, unspecified: Secondary | ICD-10-CM | POA: Diagnosis not present

## 2021-02-27 DIAGNOSIS — R5382 Chronic fatigue, unspecified: Secondary | ICD-10-CM | POA: Diagnosis not present

## 2021-03-02 DIAGNOSIS — L821 Other seborrheic keratosis: Secondary | ICD-10-CM | POA: Diagnosis not present

## 2021-03-02 DIAGNOSIS — L578 Other skin changes due to chronic exposure to nonionizing radiation: Secondary | ICD-10-CM | POA: Diagnosis not present

## 2021-03-02 DIAGNOSIS — C44622 Squamous cell carcinoma of skin of right upper limb, including shoulder: Secondary | ICD-10-CM | POA: Diagnosis not present

## 2021-03-05 DIAGNOSIS — I10 Essential (primary) hypertension: Secondary | ICD-10-CM | POA: Diagnosis not present

## 2021-03-05 DIAGNOSIS — M25571 Pain in right ankle and joints of right foot: Secondary | ICD-10-CM | POA: Diagnosis not present

## 2021-03-05 DIAGNOSIS — I2510 Atherosclerotic heart disease of native coronary artery without angina pectoris: Secondary | ICD-10-CM | POA: Diagnosis not present

## 2021-03-05 DIAGNOSIS — R102 Pelvic and perineal pain: Secondary | ICD-10-CM | POA: Diagnosis not present

## 2021-03-09 ENCOUNTER — Other Ambulatory Visit: Payer: Self-pay

## 2021-03-09 ENCOUNTER — Ambulatory Visit
Admission: RE | Admit: 2021-03-09 | Discharge: 2021-03-09 | Disposition: A | Discharge status: Home / Self Care | Payer: Medicare PPO | Source: Ambulatory Visit | Attending: Cardiovascular Disease | Admitting: Cardiovascular Disease

## 2021-03-09 ENCOUNTER — Other Ambulatory Visit: Payer: Self-pay | Admitting: Cardiovascular Disease

## 2021-03-09 DIAGNOSIS — R1030 Lower abdominal pain, unspecified: Secondary | ICD-10-CM | POA: Diagnosis not present

## 2021-03-09 DIAGNOSIS — R109 Unspecified abdominal pain: Secondary | ICD-10-CM | POA: Diagnosis not present

## 2021-03-11 ENCOUNTER — Other Ambulatory Visit: Payer: Self-pay | Admitting: Radiation Therapy

## 2021-03-21 ENCOUNTER — Ambulatory Visit
Admission: RE | Admit: 2021-03-21 | Discharge: 2021-03-21 | Disposition: A | Discharge status: Home / Self Care | Payer: Medicare PPO | Source: Ambulatory Visit | Attending: Internal Medicine | Admitting: Internal Medicine

## 2021-03-21 ENCOUNTER — Other Ambulatory Visit: Payer: Self-pay

## 2021-03-21 DIAGNOSIS — D352 Benign neoplasm of pituitary gland: Secondary | ICD-10-CM | POA: Diagnosis not present

## 2021-03-21 MED ORDER — GADOBENATE DIMEGLUMINE 529 MG/ML IV SOLN
6.0000 mL | Freq: Once | INTRAVENOUS | Status: AC | PRN
  Administered 2021-03-21: 6 mL via INTRAVENOUS

## 2021-03-23 ENCOUNTER — Inpatient Hospital Stay: Payer: Medicare PPO | Attending: Internal Medicine

## 2021-03-23 DIAGNOSIS — R531 Weakness: Secondary | ICD-10-CM | POA: Insufficient documentation

## 2021-03-23 DIAGNOSIS — Z79899 Other long term (current) drug therapy: Secondary | ICD-10-CM | POA: Insufficient documentation

## 2021-03-23 DIAGNOSIS — M129 Arthropathy, unspecified: Secondary | ICD-10-CM | POA: Insufficient documentation

## 2021-03-23 DIAGNOSIS — E7800 Pure hypercholesterolemia, unspecified: Secondary | ICD-10-CM | POA: Insufficient documentation

## 2021-03-23 DIAGNOSIS — Z923 Personal history of irradiation: Secondary | ICD-10-CM | POA: Insufficient documentation

## 2021-03-23 DIAGNOSIS — K219 Gastro-esophageal reflux disease without esophagitis: Secondary | ICD-10-CM | POA: Insufficient documentation

## 2021-03-23 DIAGNOSIS — I252 Old myocardial infarction: Secondary | ICD-10-CM | POA: Insufficient documentation

## 2021-03-23 DIAGNOSIS — D352 Benign neoplasm of pituitary gland: Secondary | ICD-10-CM | POA: Insufficient documentation

## 2021-03-23 DIAGNOSIS — M545 Low back pain, unspecified: Secondary | ICD-10-CM | POA: Insufficient documentation

## 2021-03-23 DIAGNOSIS — R5383 Other fatigue: Secondary | ICD-10-CM | POA: Insufficient documentation

## 2021-03-23 DIAGNOSIS — Z8673 Personal history of transient ischemic attack (TIA), and cerebral infarction without residual deficits: Secondary | ICD-10-CM | POA: Insufficient documentation

## 2021-03-23 DIAGNOSIS — Z85828 Personal history of other malignant neoplasm of skin: Secondary | ICD-10-CM | POA: Insufficient documentation

## 2021-03-23 DIAGNOSIS — I10 Essential (primary) hypertension: Secondary | ICD-10-CM | POA: Insufficient documentation

## 2021-03-26 ENCOUNTER — Other Ambulatory Visit: Payer: Self-pay

## 2021-03-26 ENCOUNTER — Inpatient Hospital Stay: Payer: Medicare PPO | Admitting: Internal Medicine

## 2021-03-26 VITALS — BP 144/83 | HR 78 | Temp 97.5°F | Resp 18 | Wt 135.0 lb

## 2021-03-26 DIAGNOSIS — Z85828 Personal history of other malignant neoplasm of skin: Secondary | ICD-10-CM | POA: Diagnosis not present

## 2021-03-26 DIAGNOSIS — E7800 Pure hypercholesterolemia, unspecified: Secondary | ICD-10-CM | POA: Diagnosis not present

## 2021-03-26 DIAGNOSIS — Z8673 Personal history of transient ischemic attack (TIA), and cerebral infarction without residual deficits: Secondary | ICD-10-CM | POA: Diagnosis not present

## 2021-03-26 DIAGNOSIS — K219 Gastro-esophageal reflux disease without esophagitis: Secondary | ICD-10-CM | POA: Diagnosis not present

## 2021-03-26 DIAGNOSIS — I10 Essential (primary) hypertension: Secondary | ICD-10-CM | POA: Diagnosis not present

## 2021-03-26 DIAGNOSIS — D352 Benign neoplasm of pituitary gland: Secondary | ICD-10-CM

## 2021-03-26 DIAGNOSIS — M129 Arthropathy, unspecified: Secondary | ICD-10-CM | POA: Diagnosis not present

## 2021-03-26 DIAGNOSIS — Z79899 Other long term (current) drug therapy: Secondary | ICD-10-CM | POA: Diagnosis not present

## 2021-03-26 DIAGNOSIS — I252 Old myocardial infarction: Secondary | ICD-10-CM | POA: Diagnosis not present

## 2021-03-26 DIAGNOSIS — M545 Low back pain, unspecified: Secondary | ICD-10-CM | POA: Diagnosis not present

## 2021-03-26 DIAGNOSIS — Z923 Personal history of irradiation: Secondary | ICD-10-CM | POA: Diagnosis not present

## 2021-03-26 DIAGNOSIS — R5383 Other fatigue: Secondary | ICD-10-CM | POA: Diagnosis not present

## 2021-03-26 DIAGNOSIS — R531 Weakness: Secondary | ICD-10-CM | POA: Diagnosis not present

## 2021-03-26 NOTE — Progress Notes (Signed)
[facility] [place], [place] [address] [phone]   Interval Evaluation  Date of Service: 03/26/21 Patient Name: Adriana Jordan Patient MRN: [id number] Patient DOB: Apr 30, 1935 Provider: Ventura Sellers, MD  Identifying Statement:  Adriana Jordan is a 86 y.o. female with  pituitary   adenoma    Oncologic History: 05/21/15: Resection by Dr. Joya Salm 07/03/18: [place] with Dr. Isidore Moos following radiographic progression  Interval History: Adriana Jordan presents to clinic today for follow up after recent MRI brain.  She describes no new or progressive neurologic deficits.  She does describe increased fatigue and also unintentional weight loss in recent weeks. No headaches or visual impairment.  No recurrence of stroke symptoms.  H+P (10/11/19) Patient presents to clinic for consultation and evaluation after incidental stroke identified on routine follow up MRI for treated pituitary adenoma.  She denies any focal neurologic symptoms or any stroke-like symptoms.  No seizures or headaches.  Continues to live independently with full functional status, walks upwards of 2 miles each day for exercise.   Medications: Current Outpatient Medications on File Prior to Visit  Medication Sig Dispense Refill   HYDROcodone-acetaminophen (NORCO/VICODIN) 5-325 MG tablet Take 1 tablet by mouth daily as needed.     Artificial Tear Ointment (DRY EYES OP) Place 2 drops into both eyes daily as needed.     aspirin 325 MG EC tablet Take 325 mg by mouth daily.     losartan-hydrochlorothiazide (HYZAAR) 100-12.5 MG tablet Take 1 tablet by mouth daily. 30 tablet 3   Multiple Vitamin (MULTIVITAMIN) tablet Take 1 tablet by mouth daily. Women     omeprazole (PRILOSEC) 20 MG capsule Take 20 mg by mouth daily.     potassium chloride (KLOR-CON) 10 MEQ tablet Take 1 tablet by mouth daily.     rosuvastatin (CRESTOR) 20 MG tablet Take 1 tablet by mouth at bedtime.      Current Facility-Administered Medications on File Prior to Visit  Medication Dose Route Frequency Provider Last Rate Last Admin   triamcinolone acetonide (KENALOG) 10 MG/ML injection 10 mg  10 mg Other Once Landis Martins, DPM        Allergies: No Known Allergies Past Medical History:  Past Medical History:  Diagnosis Date   Arthritis    "all over" (11/14/2017)   Brain tumor ([place])    "I still have it"; not cancer (11/14/2017)   Chronic lower back pain    Coronary artery disease    Dr. Doylene Canard   GERD (gastroesophageal reflux disease)    History of blood transfusion    "don't remember why" (11/14/2017)   History of radiation therapy 07/03/2018   brain, pituitary/ 12.5 Gy in 1 fraction   Hypercholesteremia    Hypertension    MI (myocardial infarction) ([facility]) 2006   PONV (postoperative nausea and vomiting)    Skin cancer    "right forearm"   Past Surgical History:  Past Surgical History:  Procedure Laterality Date   ANTERIOR CERVICAL DECOMP/DISCECTOMY FUSION     BACK SURGERY     CARPAL TUNNEL RELEASE Bilateral    CORONARY ANGIOPLASTY     DES mid LAD and mid RCA 09/21/04   CRANIOTOMY N/A 05/21/2015   Procedure: Transsphenoidal resection of pituitary tumor with Dr. Radene Journey for approach;  Surgeon: Leeroy Cha, MD;  Location[facility] NEURO ORS;  Service: Neurosurgery;  Laterality: N/A;  Transsphenoidal resection of pituitary tumor with Dr. Radene Journey for approach  SHOULDER OPEN ROTATOR CUFF REPAIR Bilateral    SKIN CANCER EXCISION Right    forearm   thumb surgery Right    placed srews to make straight   THYROID SURGERY     "goiter removed"   Social History:  Social History   Socioeconomic History   Marital status: Divorced    Spouse name: Not on file   Number of children: Not on file   Years of education: Not on file   Highest education level: Not on file  Occupational History   Not on file  Tobacco Use   Smoking status: Former    Packs/day: 0.12    Years: 5.00     Pack years: 0.60    Types: Cigarettes    Quit date: 04/26/1969    Years since quitting: 51.9   Smokeless tobacco: Never  Vaping Use   Vaping Use: Never used  Substance and Sexual Activity   Alcohol use: Yes    Comment: 11/14/2017 "couple glasses of wine/year"   Drug use: No   Sexual activity: Not Currently    Comment: 1ST intercourse- 19, partners- 5,   Other Topics Concern   Not on file  Social History Narrative   Not on file   Social Determinants of Health   Financial Resource Strain: Not on file  Food Insecurity: Not on file  Transportation Needs: Not on file  Physical Activity: Not on file  Stress: Not on file  Social Connections: Not on file  Intimate Partner Violence: Not on file   Family History:  Family History  Problem Relation Age of Onset   Cancer Father 60       throat- smoker    Breast cancer Sister 42   Cancer Brother 35       lung- smoker    Review of Systems: Constitutional: Doesn't report fevers, chills or abnormal weight loss Eyes: Doesn't report blurriness of vision Ears, nose, mouth, throat, and face: Doesn't report sore throat Respiratory: Doesn't report cough, dyspnea or wheezes Cardiovascular: Doesn't report palpitation, chest discomfort  Gastrointestinal:  Doesn't report nausea, constipation, diarrhea GU: Doesn't report incontinence Skin: Doesn't report skin rashes Neurological: Per HPI Musculoskeletal: Doesn't report joint pain Behavioral/Psych: Doesn't report anxiety  Physical Exam: Vitals:   03/26/21 1158  BP: (!) 144/83  Pulse: 78  Resp: 18  Temp: (!) 97.5 F (36.4 C)  SpO2: 97%   KPS: 90. General: Alert, cooperative, pleasant, in no acute distress Head: Normal EENT: No conjunctival injection or scleral icterus.  Lungs: Resp effort normal Cardiac: Regular rate Abdomen: Non-distended abdomen Skin: No rashes cyanosis or petechiae. Extremities: No clubbing or edema  Neurologic Exam: Mental Status: Awake, alert,  attentive to examiner. Oriented to self and environment. Language is fluent with intact comprehension.  Cranial Nerves: Visual acuity is grossly normal. Visual fields are full. Extra-ocular movements intact. No ptosis. Face is symmetric Motor: Tone and bulk are normal. Power is full in both arms and legs. Reflexes are symmetric, no pathologic reflexes present.  Sensory: Intact to light touch Gait: Normal.   Labs: I have reviewed the data as listed    Component Value Date/Time   NA 135 11/15/2017 0439   K 3.4 (L) 11/15/2017 0439   CL 103 11/15/2017 0439   CO2 22 11/15/2017 0439   GLUCOSE 90 11/15/2017 0439   BUN 21 (H) 11/15/2017 0439   CREATININE 1.23 (H) 11/15/2017 0439   CALCIUM 8.7 (L) 11/15/2017 0439   PROT 7.2 11/14/2017 1250   ALBUMIN 3.7  11/14/2017 1250   AST 43 (H) 11/14/2017 1250   ALT 22 11/14/2017 1250   ALKPHOS 61 11/14/2017 1250   BILITOT 0.6 11/14/2017 1250   GFRNONAA 40 (L) 11/15/2017 0439   GFRAA 46 (L) 11/15/2017 0439   Lab Results  Component Value Date   WBC 1.8 (L) 11/15/2017   NEUTROABS 1.0 (L) 11/14/2017   HGB 11.0 (L) 11/15/2017   HCT 33.1 (L) 11/15/2017   MCV 92.7 11/15/2017   PLT 240 11/15/2017    Imaging:  [place] Clinician Interpretation: I have personally reviewed the CNS images as listed.  My interpretation, in the context of the patient's clinical presentation, is stable disease  MR BRAIN W WO CONTRAST  Result Date: 03/22/2021 CLINICAL DATA:  86 year old female with history of pituitary tumor status post resection in 2016 and SRS in 2019. EXAM: MRI HEAD WITHOUT AND WITH CONTRAST TECHNIQUE: Multiplanar, multiecho pulse sequences of the brain and surrounding structures were obtained without and with intravenous contrast. CONTRAST:  47m MULTIHANCE GADOBENATE DIMEGLUMINE 529 MG/ML IV SOLN COMPARISON:  Brain MRI 03/21/2020 and earlier. FINDINGS: Brain: No restricted diffusion to suggest acute infarction. No midline shift, mass effect, evidence of mass  lesion, ventriculomegaly, extra-axial collection or acute intracranial hemorrhage. Cervicomedullary junction is within normal limits. Widely scattered, patchy bilateral cerebral white matter T2 and FLAIR hyperintensity is stable since last year. Chronic involvement of the external capsule on the left. No visible cortical encephalomalacia following the left middle frontal gyrus punctate infarct last year. Tiny chronic cerebellar lacunar infarcts are stable. Stable chronic microhemorrhage in the right deep cerebellar nuclei. No new signal abnormality identified. No abnormal gray or white matter enhancement. No dural thickening identified. Vascular: Major intracranial vascular flow voids are stable. Skull and upper cervical spine: Chronic cervical ACDF. Normal visible bone marrow signal. Sinuses/Orbits: Negative orbits. Chronic paranasal sinus mucosal thickening has increased in the ethmoids since last year. Mastoids remain clear. Other: Dedicated pituitary imaging. Suprasellar cistern remains patent. Deviated infundibulum to the right is stable. No thickening of the infundibulum. Normal hypothalamus. The right cavernous sinus remains normal. Heterogeneously enhancing increased soft tissue extending from the central sella into the left cavernous sinus is unchanged in size and configuration since February of 2021, approximately 16 x 16 x 8 mm (AP by transverse by CC). No new abnormality in the region. IMPRESSION: 1. Continued stable post treatment appearance of the left side pituitary adenoma. Unchanged chronic left cavernous sinus involvement. 2. Chronic cerebral small vessel ischemia. No acute intracranial abnormality. Electronically Signed   By: HGenevie AnnM.D.   On: 03/22/2021 10:50   DG Abd 2 Views  Result Date: 03/10/2021 CLINICAL DATA:  Lower abdominal pain. EXAM: ABDOMEN - 2 VIEW COMPARISON:  None. FINDINGS: Normal abdominal gas pattern. No free intraperitoneal gas. No organomegaly. Calcifications overlying the  iliac crests bilaterally represent gluteal calcifications better seen on CT examination of 11/09/2017. Multiple phleboliths noted within the pelvis. No acute bone abnormality. IMPRESSION: Normal abdominal gas pattern. Electronically Signed   By: AFidela SalisburyMD   On: 03/10/2021 03:17    Assessment/Plan Pituitary adenoma ([facility] [D35.2]  Stroke  HEdsel Jordan clinically and radiographically stable today.  Overall very good response to radiotherapy.  We will recheck pituitary labs to evaluate fatigue and weight loss.    Recommend continuing ASA at 325 daily and Crestor '20mg'$  daily for secondary stroke prevention.    We ask that HIISHA SILVERNALEreturn to clinic in 12 months following next brain MRI, or sooner  as needed.  Referral will be placed for Dr. Loanne Drilling if indicated based on lab results.   All questions were answered. The patient knows to call the clinic with any problems, questions or concerns. No barriers to learning were detected.  The total time spent in the encounter was 30 minutes and more than 50% was on counseling and review of test results   Ventura Sellers, MD Medical Director of Neuro-Oncology [facility] at [place] 03/26/21 11:56 AM

## 2021-04-01 ENCOUNTER — Other Ambulatory Visit: Payer: Self-pay | Admitting: Radiation Therapy

## 2021-04-02 ENCOUNTER — Inpatient Hospital Stay: Payer: Medicare PPO

## 2021-04-02 ENCOUNTER — Other Ambulatory Visit: Payer: Self-pay

## 2021-04-02 DIAGNOSIS — Z8673 Personal history of transient ischemic attack (TIA), and cerebral infarction without residual deficits: Secondary | ICD-10-CM | POA: Diagnosis not present

## 2021-04-02 DIAGNOSIS — R531 Weakness: Secondary | ICD-10-CM | POA: Diagnosis not present

## 2021-04-02 DIAGNOSIS — Z79899 Other long term (current) drug therapy: Secondary | ICD-10-CM | POA: Diagnosis not present

## 2021-04-02 DIAGNOSIS — M129 Arthropathy, unspecified: Secondary | ICD-10-CM | POA: Diagnosis not present

## 2021-04-02 DIAGNOSIS — R5383 Other fatigue: Secondary | ICD-10-CM | POA: Diagnosis not present

## 2021-04-02 DIAGNOSIS — D352 Benign neoplasm of pituitary gland: Secondary | ICD-10-CM | POA: Diagnosis not present

## 2021-04-02 DIAGNOSIS — K219 Gastro-esophageal reflux disease without esophagitis: Secondary | ICD-10-CM | POA: Diagnosis not present

## 2021-04-02 DIAGNOSIS — E7800 Pure hypercholesterolemia, unspecified: Secondary | ICD-10-CM | POA: Diagnosis not present

## 2021-04-02 DIAGNOSIS — M545 Low back pain, unspecified: Secondary | ICD-10-CM | POA: Diagnosis not present

## 2021-04-02 LAB — T4, FREE: Free T4: 0.39 ng/dL — ABNORMAL LOW (ref 0.61–1.12)

## 2021-04-02 LAB — CORTISOL: Cortisol, Plasma: 6.9 ug/dL

## 2021-04-06 ENCOUNTER — Other Ambulatory Visit: Payer: Self-pay

## 2021-04-06 ENCOUNTER — Emergency Department (HOSPITAL_COMMUNITY): Payer: Medicare PPO

## 2021-04-06 ENCOUNTER — Emergency Department (HOSPITAL_COMMUNITY)
Admission: EM | Admit: 2021-04-06 | Discharge: 2021-04-07 | Disposition: A | Discharge status: Home / Self Care | Payer: Medicare PPO | Attending: Student | Admitting: Student

## 2021-04-06 ENCOUNTER — Encounter (HOSPITAL_COMMUNITY): Payer: Self-pay | Admitting: Emergency Medicine

## 2021-04-06 DIAGNOSIS — Y9301 Activity, walking, marching and hiking: Secondary | ICD-10-CM | POA: Insufficient documentation

## 2021-04-06 DIAGNOSIS — W208XXA Other cause of strike by thrown, projected or falling object, initial encounter: Secondary | ICD-10-CM | POA: Diagnosis not present

## 2021-04-06 DIAGNOSIS — R42 Dizziness and giddiness: Secondary | ICD-10-CM | POA: Insufficient documentation

## 2021-04-06 DIAGNOSIS — Z5321 Procedure and treatment not carried out due to patient leaving prior to being seen by health care provider: Secondary | ICD-10-CM | POA: Insufficient documentation

## 2021-04-06 DIAGNOSIS — R519 Headache, unspecified: Secondary | ICD-10-CM | POA: Diagnosis present

## 2021-04-06 DIAGNOSIS — R531 Weakness: Secondary | ICD-10-CM | POA: Diagnosis not present

## 2021-04-06 DIAGNOSIS — W19XXXA Unspecified fall, initial encounter: Secondary | ICD-10-CM | POA: Diagnosis not present

## 2021-04-06 DIAGNOSIS — Y92007 Garden or yard of unspecified non-institutional (private) residence as the place of occurrence of the external cause: Secondary | ICD-10-CM | POA: Insufficient documentation

## 2021-04-06 DIAGNOSIS — S0003XA Contusion of scalp, initial encounter: Secondary | ICD-10-CM | POA: Diagnosis not present

## 2021-04-06 DIAGNOSIS — S0990XA Unspecified injury of head, initial encounter: Secondary | ICD-10-CM | POA: Diagnosis not present

## 2021-04-06 DIAGNOSIS — U071 COVID-19: Secondary | ICD-10-CM | POA: Diagnosis not present

## 2021-04-06 DIAGNOSIS — H539 Unspecified visual disturbance: Secondary | ICD-10-CM | POA: Diagnosis not present

## 2021-04-06 LAB — CBC WITH DIFFERENTIAL/PLATELET
Abs Immature Granulocytes: 0 10*3/uL (ref 0.00–0.07)
Basophils Absolute: 0 10*3/uL (ref 0.0–0.1)
Basophils Relative: 0 %
Eosinophils Absolute: 0.2 10*3/uL (ref 0.0–0.5)
Eosinophils Relative: 6 %
HCT: 33 % — ABNORMAL LOW (ref 36.0–46.0)
Hemoglobin: 11.1 g/dL — ABNORMAL LOW (ref 12.0–15.0)
Immature Granulocytes: 0 %
Lymphocytes Relative: 65 %
Lymphs Abs: 1.8 10*3/uL (ref 0.7–4.0)
MCH: 30.3 pg (ref 26.0–34.0)
MCHC: 33.6 g/dL (ref 30.0–36.0)
MCV: 90.2 fL (ref 80.0–100.0)
Monocytes Absolute: 0.3 10*3/uL (ref 0.1–1.0)
Monocytes Relative: 11 %
Neutro Abs: 0.5 10*3/uL — ABNORMAL LOW (ref 1.7–7.7)
Neutrophils Relative %: 18 %
Platelets: 279 10*3/uL (ref 150–400)
RBC: 3.66 MIL/uL — ABNORMAL LOW (ref 3.87–5.11)
RDW: 11.8 % (ref 11.5–15.5)
WBC: 2.7 10*3/uL — ABNORMAL LOW (ref 4.0–10.5)
nRBC: 0 % (ref 0.0–0.2)

## 2021-04-06 LAB — RESP PANEL BY RT-PCR (FLU A&B, COVID) ARPGX2
Influenza A by PCR: NEGATIVE
Influenza B by PCR: NEGATIVE
SARS Coronavirus 2 by RT PCR: POSITIVE — AB

## 2021-04-06 LAB — COMPREHENSIVE METABOLIC PANEL
ALT: 18 U/L (ref 0–44)
AST: 38 U/L (ref 15–41)
Albumin: 3.7 g/dL (ref 3.5–5.0)
Alkaline Phosphatase: 56 U/L (ref 38–126)
Anion gap: 10 (ref 5–15)
BUN: 8 mg/dL (ref 8–23)
CO2: 22 mmol/L (ref 22–32)
Calcium: 9 mg/dL (ref 8.9–10.3)
Chloride: 97 mmol/L — ABNORMAL LOW (ref 98–111)
Creatinine, Ser: 1.01 mg/dL — ABNORMAL HIGH (ref 0.44–1.00)
GFR, Estimated: 55 mL/min — ABNORMAL LOW (ref >60)
Glucose, Bld: 102 mg/dL — ABNORMAL HIGH (ref 70–99)
Potassium: 3.3 mmol/L — ABNORMAL LOW (ref 3.5–5.1)
Sodium: 129 mmol/L — ABNORMAL LOW (ref 135–145)
Total Bilirubin: 0.6 mg/dL (ref 0.3–1.2)
Total Protein: 6.4 g/dL — ABNORMAL LOW (ref 6.5–8.1)

## 2021-04-06 NOTE — ED Triage Notes (Signed)
Patient BIB [facility], pt walking in yard and tree limb fell hitting patient in head. Pt ambulatory prior to EMS arrival. NAD. A&Ox4.

## 2021-04-06 NOTE — ED Provider Notes (Addendum)
Emergency Medicine Provider Triage Evaluation Note  Adriana Jordan , a 86 y.o. female  was evaluated in triage.  Pt complains of headache and visual changes after a tree branch hit her in the head. Also says she has been very sick the last week. Weakness and dizziness  Review of Systems  Positive: Headache, dizzy, visual changes Negative: Cp, sob  Physical Exam  BP (!) 147/83 (BP Location: Left Arm)   Pulse 71   Temp 97.7 F (36.5 C) (Oral)   Resp 15   LMP  (LMP Unknown)   SpO2 96%  Gen:   Awake, no distress   Resp:  Normal effort  MSK:   Moves extremities without difficulty  Other:  Small abrasion with minor bleeding to posterior left skull.  Medical Decision Making  Medically screening exam initiated at 7:25 PM.  Appropriate orders placed.  Adriana Jordan was informed that the remainder of the evaluation will be completed by another provider, this initial triage assessment does not replace that evaluation, and the importance of remaining in the ED until their evaluation is complete.     Rhae Hammock, PA-C 04/06/21 1937    Darliss Ridgel 04/06/21 1938    Teressa Lower, MD 04/07/21 0040

## 2021-04-07 DIAGNOSIS — R102 Pelvic and perineal pain: Secondary | ICD-10-CM | POA: Diagnosis not present

## 2021-04-07 DIAGNOSIS — E440 Moderate protein-calorie malnutrition: Secondary | ICD-10-CM | POA: Diagnosis not present

## 2021-04-07 DIAGNOSIS — I2510 Atherosclerotic heart disease of native coronary artery without angina pectoris: Secondary | ICD-10-CM | POA: Diagnosis not present

## 2021-04-07 DIAGNOSIS — M25571 Pain in right ankle and joints of right foot: Secondary | ICD-10-CM | POA: Diagnosis not present

## 2021-04-07 DIAGNOSIS — I10 Essential (primary) hypertension: Secondary | ICD-10-CM | POA: Diagnosis not present

## 2021-04-07 LAB — PATHOLOGIST SMEAR REVIEW

## 2021-04-07 NOTE — ED Notes (Signed)
Pt called 3x no answer  

## 2021-04-24 ENCOUNTER — Encounter: Payer: Self-pay | Admitting: Obstetrics & Gynecology

## 2021-04-24 ENCOUNTER — Other Ambulatory Visit: Payer: Self-pay

## 2021-04-24 ENCOUNTER — Ambulatory Visit: Payer: Medicare PPO | Admitting: Obstetrics & Gynecology

## 2021-04-24 VITALS — BP 120/76 | Temp 98.4°F | Wt 136.0 lb

## 2021-04-24 DIAGNOSIS — R634 Abnormal weight loss: Secondary | ICD-10-CM | POA: Diagnosis not present

## 2021-04-24 DIAGNOSIS — R102 Pelvic and perineal pain: Secondary | ICD-10-CM | POA: Diagnosis not present

## 2021-04-24 NOTE — Progress Notes (Signed)
    Adriana Jordan 09/15/1934 [id number]        86 y.o.  G1P1   RP: Pelvic pain x 4 weeks  HPI: Patient had mid abdominal pain which migrated to the pelvic area over the last 4 weeks.  No postmenopausal bleeding.  Patient lost 20 pounds in the last 9 months unintentionally.  No gastro symptoms except for tendency for constipation.  No vomiting.  Feeling like she is eating as usual.  No fever.   OB History  Gravida Para Term Preterm AB Living  '1 1       1  '$ SAB IAB Ectopic Multiple Live Births               # Outcome Date GA Lbr Len/2nd Weight Sex Delivery Anes PTL Lv  1 Para             Past medical history,surgical history, problem list, medications, allergies, family history and social history were all reviewed and documented in the EPIC chart.   Directed ROS with pertinent positives and negatives documented in the history of present illness/assessment and plan.  Exam:  Vitals:   04/24/21 1455  BP: 120/76  Temp: 98.4 F (36.9 C)  TempSrc: Oral   Weight 136 lb (61.7 kg)   General appearance:  Normal  Abdomen: Normal, soft, NT, no mass felt.  Gynecologic exam: Vulva normal.  Bimanual exam: Normal anteverted uterus, no adnexal mass.  Mild tenderness in the midline of the lower abdomen and pelvis.   Assessment/Plan:  86 y.o. G1P1   1. Pelvic pain in female Pelvic pain x4 weeks.  Negative gynecologic exam.  We will further investigate with a pelvic ultrasound at follow-up.  No postmenopausal bleeding.  We will consider a referral to gastroenterology if the pelvic ultrasound is negative. - US Transvaginal Non-OB; Future  2. Abnormal weight loss  Lost 20 Lbs in 9 months from 156 Lbs in 07/2020 to 136 Lbs today.  Unintentional weight loss.  Patient has no explanation for it.  Concerning for malignancy.  Princess Bruins MD, 3:16 PM 04/24/2021

## 2021-04-25 ENCOUNTER — Encounter: Payer: Self-pay | Admitting: Obstetrics & Gynecology

## 2021-05-20 DIAGNOSIS — M19071 Primary osteoarthritis, right ankle and foot: Secondary | ICD-10-CM | POA: Diagnosis not present

## 2021-05-27 DIAGNOSIS — M79671 Pain in right foot: Secondary | ICD-10-CM | POA: Diagnosis not present

## 2021-06-11 ENCOUNTER — Other Ambulatory Visit: Payer: Self-pay

## 2021-06-11 ENCOUNTER — Other Ambulatory Visit: Payer: Self-pay | Admitting: Obstetrics & Gynecology

## 2021-06-11 ENCOUNTER — Encounter: Payer: Self-pay | Admitting: Obstetrics & Gynecology

## 2021-06-11 ENCOUNTER — Ambulatory Visit (INDEPENDENT_AMBULATORY_CARE_PROVIDER_SITE_OTHER): Payer: Medicare PPO

## 2021-06-11 ENCOUNTER — Ambulatory Visit: Payer: Medicare PPO | Admitting: Obstetrics & Gynecology

## 2021-06-11 VITALS — BP 124/74 | Wt 139.0 lb

## 2021-06-11 DIAGNOSIS — R634 Abnormal weight loss: Secondary | ICD-10-CM | POA: Diagnosis not present

## 2021-06-11 DIAGNOSIS — R102 Pelvic and perineal pain: Secondary | ICD-10-CM

## 2021-06-11 NOTE — Progress Notes (Signed)
    Adriana Jordan 1934/10/06 [id number]        86 y.o.  G1P1L1  RP: Pelvic pain/Weight loss for Pelvic US  HPI: Resolved pelvic pain.  Gained 3 Lbs x last visit.   OB History  Gravida Para Term Preterm AB Living  1 1       1   SAB IAB Ectopic Multiple Live Births               # Outcome Date GA Lbr Len/2nd Weight Sex Delivery Anes PTL Lv  1 Para             Past medical history,surgical history, problem list, medications, allergies, family history and social history were all reviewed and documented in the EPIC chart.   Directed ROS with pertinent positives and negatives documented in the history of present illness/assessment and plan.  Exam:  Vitals:   06/11/21 1007  BP: 124/74  Weight: 139 lb (63 kg)   General appearance:  Normal  Pelvic US today: T/a images.  Patient refused vaginal ultrasound.  Anteverted uterus measured at 4.78 x 3.45 x 2.56 cm.  Suboptimal visualization but no obvious myometrial mass.  The endometrial lining is thin and symmetrical measured at 2.1 mm.  It was partially visualized but no mass or thickening was seen.  Both ovaries were not visualized.  Patient states that she may have had 1 or both ovaries removed in the past.  No adnexal mass seen.  No free fluid in the pelvis.   Assessment/Plan:  86 y.o. G1P1   1. Pelvic pain in female Sub-optimal scan as patient declined Trans-vaginal [place], but no pathology seen.  Normal uterus, thin endometrial line and no adnexal mass or pelvic fluid.  Patient reassured.  2. Abnormal weight loss  Improved, with 3 Lbs weight gain x last visit.  Princess Bruins MD, 10:18 AM 06/11/2021

## 2021-09-11 ENCOUNTER — Inpatient Hospital Stay (HOSPITAL_COMMUNITY)
Admission: EM | Admit: 2021-09-11 | Discharge: 2021-09-13 | DRG: 440 | Disposition: A | Discharge status: Home / Self Care | Payer: Medicare PPO | Attending: Cardiovascular Disease | Admitting: Cardiovascular Disease

## 2021-09-11 ENCOUNTER — Emergency Department (HOSPITAL_COMMUNITY): Payer: Medicare PPO

## 2021-09-11 ENCOUNTER — Encounter (HOSPITAL_COMMUNITY): Payer: Self-pay | Admitting: Emergency Medicine

## 2021-09-11 ENCOUNTER — Other Ambulatory Visit: Payer: Self-pay

## 2021-09-11 DIAGNOSIS — R0789 Other chest pain: Secondary | ICD-10-CM | POA: Diagnosis present

## 2021-09-11 DIAGNOSIS — Z803 Family history of malignant neoplasm of breast: Secondary | ICD-10-CM

## 2021-09-11 DIAGNOSIS — G8929 Other chronic pain: Secondary | ICD-10-CM | POA: Diagnosis present

## 2021-09-11 DIAGNOSIS — I10 Essential (primary) hypertension: Secondary | ICD-10-CM | POA: Diagnosis present

## 2021-09-11 DIAGNOSIS — Z85828 Personal history of other malignant neoplasm of skin: Secondary | ICD-10-CM

## 2021-09-11 DIAGNOSIS — M545 Low back pain, unspecified: Secondary | ICD-10-CM | POA: Diagnosis present

## 2021-09-11 DIAGNOSIS — Z79899 Other long term (current) drug therapy: Secondary | ICD-10-CM | POA: Diagnosis not present

## 2021-09-11 DIAGNOSIS — Z20822 Contact with and (suspected) exposure to covid-19: Secondary | ICD-10-CM | POA: Diagnosis present

## 2021-09-11 DIAGNOSIS — E7800 Pure hypercholesterolemia, unspecified: Secondary | ICD-10-CM | POA: Diagnosis present

## 2021-09-11 DIAGNOSIS — M1990 Unspecified osteoarthritis, unspecified site: Secondary | ICD-10-CM | POA: Diagnosis present

## 2021-09-11 DIAGNOSIS — Z955 Presence of coronary angioplasty implant and graft: Secondary | ICD-10-CM

## 2021-09-11 DIAGNOSIS — K219 Gastro-esophageal reflux disease without esophagitis: Secondary | ICD-10-CM | POA: Diagnosis present

## 2021-09-11 DIAGNOSIS — I252 Old myocardial infarction: Secondary | ICD-10-CM

## 2021-09-11 DIAGNOSIS — D332 Benign neoplasm of brain, unspecified: Secondary | ICD-10-CM | POA: Diagnosis present

## 2021-09-11 DIAGNOSIS — I2510 Atherosclerotic heart disease of native coronary artery without angina pectoris: Secondary | ICD-10-CM | POA: Diagnosis present

## 2021-09-11 DIAGNOSIS — K859 Acute pancreatitis without necrosis or infection, unspecified: Secondary | ICD-10-CM | POA: Diagnosis present

## 2021-09-11 DIAGNOSIS — I959 Hypotension, unspecified: Secondary | ICD-10-CM

## 2021-09-11 DIAGNOSIS — Z87891 Personal history of nicotine dependence: Secondary | ICD-10-CM | POA: Diagnosis not present

## 2021-09-11 DIAGNOSIS — I444 Left anterior fascicular block: Secondary | ICD-10-CM | POA: Diagnosis present

## 2021-09-11 DIAGNOSIS — Z885 Allergy status to narcotic agent status: Secondary | ICD-10-CM | POA: Diagnosis not present

## 2021-09-11 DIAGNOSIS — Z923 Personal history of irradiation: Secondary | ICD-10-CM

## 2021-09-11 DIAGNOSIS — Z7982 Long term (current) use of aspirin: Secondary | ICD-10-CM

## 2021-09-11 HISTORY — DX: Other chest pain: R07.89

## 2021-09-11 LAB — LIPASE, BLOOD: Lipase: 55 U/L — ABNORMAL HIGH (ref 11–51)

## 2021-09-11 LAB — D-DIMER, QUANTITATIVE: D-Dimer, Quant: 1.51 ug/mL-FEU — ABNORMAL HIGH (ref 0.00–0.50)

## 2021-09-11 LAB — COMPREHENSIVE METABOLIC PANEL
ALT: 18 U/L (ref 0–44)
AST: 36 U/L (ref 15–41)
Albumin: 3.6 g/dL (ref 3.5–5.0)
Alkaline Phosphatase: 67 U/L (ref 38–126)
Anion gap: 8 (ref 5–15)
BUN: 8 mg/dL (ref 8–23)
CO2: 26 mmol/L (ref 22–32)
Calcium: 8.9 mg/dL (ref 8.9–10.3)
Chloride: 101 mmol/L (ref 98–111)
Creatinine, Ser: 1.06 mg/dL — ABNORMAL HIGH (ref 0.44–1.00)
GFR, Estimated: 52 mL/min — ABNORMAL LOW (ref >60)
Glucose, Bld: 98 mg/dL (ref 70–99)
Potassium: 3.3 mmol/L — ABNORMAL LOW (ref 3.5–5.1)
Sodium: 135 mmol/L (ref 135–145)
Total Bilirubin: 0.8 mg/dL (ref 0.3–1.2)
Total Protein: 6.4 g/dL — ABNORMAL LOW (ref 6.5–8.1)

## 2021-09-11 LAB — CBC
HCT: 35.1 % — ABNORMAL LOW (ref 36.0–46.0)
HCT: 36.6 % (ref 36.0–46.0)
Hemoglobin: 11.9 g/dL — ABNORMAL LOW (ref 12.0–15.0)
Hemoglobin: 12.5 g/dL (ref 12.0–15.0)
MCH: 30.6 pg (ref 26.0–34.0)
MCH: 30.8 pg (ref 26.0–34.0)
MCHC: 33.9 g/dL (ref 30.0–36.0)
MCHC: 34.2 g/dL (ref 30.0–36.0)
MCV: 90.2 fL (ref 80.0–100.0)
MCV: 90.1 fL (ref 80.0–100.0)
Platelets: 333 10*3/uL (ref 150–400)
Platelets: 330 10*3/uL (ref 150–400)
RBC: 3.89 MIL/uL (ref 3.87–5.11)
RBC: 4.06 MIL/uL (ref 3.87–5.11)
RDW: 13.2 % (ref 11.5–15.5)
RDW: 13 % (ref 11.5–15.5)
WBC: 4.6 10*3/uL (ref 4.0–10.5)
WBC: 4.4 10*3/uL (ref 4.0–10.5)
nRBC: 0 % (ref 0.0–0.2)
nRBC: 0 % (ref 0.0–0.2)

## 2021-09-11 LAB — TROPONIN I (HIGH SENSITIVITY)
Troponin I (High Sensitivity): 5 ng/L (ref <18)
Troponin I (High Sensitivity): 5 ng/L (ref <18)

## 2021-09-11 LAB — RESP PANEL BY RT-PCR (FLU A&B, COVID) ARPGX2
Influenza A by PCR: NEGATIVE
Influenza B by PCR: NEGATIVE
SARS Coronavirus 2 by RT PCR: NEGATIVE

## 2021-09-11 LAB — CREATININE, SERUM
Creatinine, Ser: 1.04 mg/dL — ABNORMAL HIGH (ref 0.44–1.00)
GFR, Estimated: 53 mL/min — ABNORMAL LOW (ref >60)

## 2021-09-11 LAB — MAGNESIUM: Magnesium: 1.9 mg/dL (ref 1.7–2.4)

## 2021-09-11 LAB — PROTIME-INR
INR: 1 (ref 0.8–1.2)
Prothrombin Time: 12.7 seconds (ref 11.4–15.2)

## 2021-09-11 LAB — CBG MONITORING, ED: Glucose-Capillary: 104 mg/dL — ABNORMAL HIGH (ref 70–99)

## 2021-09-11 MED ORDER — ENOXAPARIN SODIUM 40 MG/0.4ML IJ SOSY
40.0000 mg | PREFILLED_SYRINGE | INTRAMUSCULAR | Status: DC
  Administered 2021-09-11 – 2021-09-12 (×2): 40 mg via SUBCUTANEOUS
  Filled 2021-09-11 (×2): qty 0.4

## 2021-09-11 MED ORDER — IOHEXOL 350 MG/ML SOLN
100.0000 mL | Freq: Once | INTRAVENOUS | Status: AC | PRN
  Administered 2021-09-11: 100 mL via INTRAVENOUS

## 2021-09-11 MED ORDER — SODIUM CHLORIDE 0.9 % IV SOLN: INTRAVENOUS | Status: DC

## 2021-09-11 MED ORDER — ONDANSETRON HCL 4 MG/2ML IJ SOLN
4.0000 mg | Freq: Once | INTRAMUSCULAR | Status: AC
  Administered 2021-09-11: 4 mg via INTRAVENOUS
  Filled 2021-09-11: qty 2

## 2021-09-11 MED ORDER — FENTANYL CITRATE PF 50 MCG/ML IJ SOSY
25.0000 ug | PREFILLED_SYRINGE | Freq: Once | INTRAMUSCULAR | Status: AC
  Administered 2021-09-11: 25 ug via INTRAVENOUS
  Filled 2021-09-11: qty 1

## 2021-09-11 MED ORDER — SODIUM CHLORIDE 0.9 % IV BOLUS
1000.0000 mL | Freq: Once | INTRAVENOUS | Status: AC
  Administered 2021-09-11: 1000 mL via INTRAVENOUS

## 2021-09-11 MED ORDER — FENTANYL CITRATE PF 50 MCG/ML IJ SOSY
12.5000 ug | PREFILLED_SYRINGE | Freq: Once | INTRAMUSCULAR | Status: AC
  Administered 2021-09-11: 12.5 ug via INTRAVENOUS
  Filled 2021-09-11: qty 1

## 2021-09-11 MED ORDER — NITROGLYCERIN 0.4 MG SL SUBL
0.4000 mg | SUBLINGUAL_TABLET | SUBLINGUAL | Status: DC | PRN
  Administered 2021-09-11 (×2): 0.4 mg via SUBLINGUAL
  Filled 2021-09-11: qty 1

## 2021-09-11 NOTE — ED Provider Notes (Signed)
[facility] EMERGENCY DEPARTMENT Provider Note   CSN: [id number] Arrival date & time: 09/11/21  1751     History  Chief Complaint  Patient presents with   Chest Pain    Adriana Jordan is a 87 y.o. female.  HPI Patient presents from home via EMS due to concern of chest pain.  She notes that she awoke about 2 hours ago with pain, described as someone standing on her chest, similar to prior MI.  She went to bed in her usual state of health.  She has been compliant with all medication, including aspirin, Plavix.  EMS reports patient was complaining of pain, nitroglycerin, but had a drop of 30 point systolic blood pressure after receiving this.  She received aspirin at home as well.  No EMS report of other notable abnormalities.  Patient denies numbness or weakness in any extremity, nausea, fever, dyspnea.    Home Medications Prior to Admission medications   Medication Sig Start Date End Date Taking? Authorizing Provider  amLODipine (NORVASC) 2.5 MG tablet amlodipine 2.5 mg tablet    [provider]  Artificial Tear Ointment (DRY EYES OP) Place 2 drops into both eyes daily as needed.    [provider]  aspirin 325 MG EC tablet Take 325 mg by mouth daily.    [provider]  HYDROcodone-acetaminophen (NORCO/VICODIN) 5-325 MG tablet Take 1 tablet by mouth daily as needed. 03/05/20   [provider]  losartan (COZAAR) 50 MG tablet losartan 50 mg tablet    [provider]  omeprazole (PRILOSEC) 20 MG capsule Take 20 mg by mouth daily.    [provider]  potassium chloride (KLOR-CON) 10 MEQ tablet Take 1 tablet by mouth daily.    [provider]      Allergies    Codeine    Review of Systems   Review of Systems  Constitutional:        Per HPI, otherwise negative  HENT:         Per HPI, otherwise negative  Respiratory:         Per HPI, otherwise negative  Cardiovascular:        Per HPI, otherwise  negative  Gastrointestinal:  Negative for vomiting.  Endocrine:       Negative aside from HPI  Genitourinary:        Neg aside from HPI   Musculoskeletal:        Per HPI, otherwise negative  Skin: Negative.   Neurological:  Negative for syncope.   Physical Exam Updated Vital Signs BP (!) 153/81 (BP Location: Left Arm)    Pulse 74    Temp (!) 97.3 F (36.3 C) (Axillary)    Resp 16    LMP  (LMP Unknown)    SpO2 96%  Physical Exam Vitals and nursing note reviewed.  Constitutional:      General: She is not in acute distress.    Appearance: She is well-developed.  HENT:     Head: Normocephalic and atraumatic.  Eyes:     Conjunctiva/sclera: Conjunctivae normal.  Cardiovascular:     Rate and Rhythm: Normal rate and regular rhythm.  Pulmonary:     Effort: Pulmonary effort is normal. No respiratory distress.     Breath sounds: Normal breath sounds. No stridor.  Chest:    Abdominal:     General: There is no distension.  Skin:    General: Skin is warm and dry.  Neurological:  Mental Status: She is alert and oriented to person, place, and time.     Cranial Nerves: No cranial nerve deficit.    ED Results / Procedures / Treatments   Labs (all labs ordered are listed, but only abnormal results are displayed) Labs Reviewed - No data to display  EKG EKG Interpretation  Date/Time:  Friday September 11 2021 09:41:00 EST Ventricular Rate:  74 PR Interval:  207 QRS Duration: 96 QT Interval:  429 QTC Calculation: 476 R Axis:   -54 Text Interpretation: Sinus rhythm Left anterior fascicular block Abnormal R-wave progression, early transition Otherwise within normal limits Confirmed by Carmin Muskrat [phone]) on 09/11/2021 9:45:07 AM  Radiology No results found.  Procedures Procedures    Medications Ordered in ED Medications  nitroGLYCERIN (NITROSTAT) SL tablet 0.4 mg (has no administration in time range)    ED Course/ Medical Decision Making/ A&P  This patient presents  to the ED for concern of chest pain, this involves an extensive number of treatment options, and is a complaint that carries with it a high risk of complications and morbidity.  The differential diagnosis includes ACS, pneumothorax, pneumonia, PE, esophageal disorder   Co morbidities that complicate the patient evaluation  History of MI, hypertension age   Social Determinants of Health:  Age, prior cigarette addiction   Additional history obtained:  Additional history and/or information obtained from EMS providers, chart review External records from outside source obtained and reviewed including blood pressure decrease with provision of nitroglycerin in route, prior MI, prior tumor brain   After the initial evaluation, orders, including: X-ray, labs, EKG were initiated.  Patient placed on Cardiac and Pulse-Oximetry Monitors. The patient was maintained on a cardiac monitor.  The cardiac monitored showed an rhythm of sinus rhythm, 70s, unremarkable The patient was also maintained on pulse oximetry. The readings were typically 99% room air normal  On repeat evaluation of the patient improved  Lab Tests:  I personally interpreted labs.  The pertinent results include: Elevated D-dimer, elevated lipase, but 2 normal troponin, and negative COVID test  Imaging Studies ordered:  I independently visualized and interpreted imaging which showed CT chest with some evidence for esophagitis no evidence for pneumonia I agree with the radiologist interpretation  Consultations Obtained:  I requested consultation with the primary care physician,  and discussed lab and imaging findings as well as pertinent plan -Dr. Doylene Canard will evaluate the patient  Dispostion / Final MDM:  After consideration of the diagnostic results and the patient's response to treatment, she has improved substantially, has no ongoing pain.  However, given the patient's substantial pain earlier, episodic hypotension, and  concern for atypical chest pain versus mild pancreatitis or esophagitis, with difficulty to control pain, patient will require evaluation by her primary care physician who is aware of the patient.  Other initial findings are somewhat reassuring, no evidence for pneumonia, bacteremia, sepsis, no evidence for pulmonary embolism, low suspicion for aortic dissection..  I feel that the patent would benefit from hospitalization.    Final Clinical Impression(s) / ED Diagnoses Final diagnoses:  Atypical chest pain      Carmin Muskrat, MD 09/11/21 1655

## 2021-09-11 NOTE — ED Triage Notes (Addendum)
Patient BIB [facility] from home after being awaken this morning by intense nausea and chest pain, pain is described as feeling like "someone is standing on my chest". Patient took 324mg  ASA prior to EMS arrival on scene, then patient was given 1x SL NTG that decreased her pain but also decreased her systolic BP by 25 points. 20g saline lock in left AC from EMS, patient is alert and oriented at this time.  EMS Vitals BP 120/70 CBG 147 HR 80 NSR 95% on room air

## 2021-09-11 NOTE — ED Notes (Signed)
Pt c/o allover body aches d/t being in the stretcher for so long. Pt also actively throwing up. EDP A.Gray made aware. No active orders at this time.

## 2021-09-11 NOTE — H&P (Addendum)
Referring Physician: Dr. Elmo Putt. Adriana Jordan is an 87 y.o. female.                       Chief Complaint: chest pain and nausea  HPI: 87 years old female with PMH of Brain tumor s/p surgery, HTN, HLD, CAD, S/P stent and Arthritis has chest pain this AM, heavy pressure type and responding to SL NTG. She had unremarkable CT angio chest for PE, normal chest x-ray and HS troponin I x 2.  Currently she is chest pain free but has significant nausea. Her lipase is minimally elevated. EKG shows NSR with Left anterior fascicular block similar to 3 years ago.   Past Medical History:  Diagnosis Date   Arthritis    "all over" (11/14/2017)   Brain tumor ([place])    "I still have it"; not cancer (11/14/2017)   Chronic lower back pain    Coronary artery disease    Dr. Doylene Canard   GERD (gastroesophageal reflux disease)    History of blood transfusion    "don't remember why" (11/14/2017)   History of radiation therapy 07/03/2018   brain, pituitary/ 12.5 Gy in 1 fraction   Hypercholesteremia    Hypertension    MI (myocardial infarction) ([place]) 2006   PONV (postoperative nausea and vomiting)    Skin cancer    "right forearm"      Past Surgical History:  Procedure Laterality Date   ANTERIOR CERVICAL DECOMP/DISCECTOMY FUSION     BACK SURGERY     CARPAL TUNNEL RELEASE Bilateral    CORONARY ANGIOPLASTY     DES mid LAD and mid RCA 09/21/04   CRANIOTOMY N/A 05/21/2015   Procedure: Transsphenoidal resection of pituitary tumor with Dr. Radene Journey for approach;  Surgeon: Leeroy Cha, MD;  Location: [facility] NEURO ORS;  Service: Neurosurgery;  Laterality: N/A;  Transsphenoidal resection of pituitary tumor with Dr. Radene Journey for approach   SHOULDER OPEN ROTATOR CUFF REPAIR Bilateral    SKIN CANCER EXCISION Right    forearm   thumb surgery Right    placed srews to make straight   THYROID SURGERY     "goiter removed"    Family History  Problem Relation Age of Onset   Cancer Father 60        throat- smoker    Breast cancer Sister 63   Cancer Brother 52       lung- smoker   Social History:  reports that she quit smoking about 52 years ago. Her smoking use included cigarettes. She has a 0.60 pack-year smoking history. She has never used smokeless tobacco. She reports that she does not currently use alcohol. She reports that she does not use drugs.  Allergies:  Allergies  Allergen Reactions   Codeine Nausea And Vomiting    Tolerates hydrocodone    (Not in a hospital admission)   Results for orders placed or performed during the hospital encounter of 09/11/21 (from the past 48 hour(s))  Troponin I (High Sensitivity)     Status: None   Collection Time: 09/11/21  9:49 AM  Result Value Ref Range   Troponin I (High Sensitivity) 5 <18 ng/L    Comment: (NOTE) Elevated high sensitivity troponin I (hsTnI) values and significant  changes across serial measurements may suggest ACS but many other  chronic and acute conditions are known to elevate hsTnI results.  Refer to the "Links" section for chest pain algorithms and additional  guidance. Performed at [facility]  [facility], [place] [address], [place], [place] [address]   CBC     Status: Abnormal   Collection Time: 09/11/21  9:49 AM  Result Value Ref Range   WBC 4.6 4.0 - 10.5 K/uL   RBC 3.89 3.87 - 5.11 MIL/uL   Hemoglobin 11.9 (L) 12.0 - 15.0 g/dL   HCT 35.1 (L) 36.0 - 46.0 %   MCV 90.2 80.0 - 100.0 fL   MCH 30.6 26.0 - 34.0 pg   MCHC 33.9 30.0 - 36.0 g/dL   RDW 13.2 11.5 - 15.5 %   Platelets 333 150 - 400 K/uL   nRBC 0.0 0.0 - 0.2 %    Comment: Performed at [facility], [place] [address] [place], [place] [address]  Magnesium     Status: None   Collection Time: 09/11/21  9:49 AM  Result Value Ref Range   Magnesium 1.9 1.7 - 2.4 mg/dL    Comment: Performed at [facility] [address], [place], [place] [address]  Lipase, blood     Status: Abnormal   Collection Time: 09/11/21  9:49 AM  Result Value Ref  Range   Lipase 55 (H) 11 - 51 U/L    Comment: Performed at [facility], [place] [address], [place], [place] [address]  Comprehensive metabolic panel     Status: Abnormal   Collection Time: 09/11/21  9:49 AM  Result Value Ref Range   Sodium 135 135 - 145 mmol/L   Potassium 3.3 (L) 3.5 - 5.1 mmol/L   Chloride 101 98 - 111 mmol/L   CO2 26 22 - 32 mmol/L   Glucose, Bld 98 70 - 99 mg/dL    Comment: Glucose reference range applies only to samples taken after fasting for at least 8 hours.   BUN 8 8 - 23 mg/dL   Creatinine, Ser 1.06 (H) 0.44 - 1.00 mg/dL   Calcium 8.9 8.9 - 10.3 mg/dL   Total Protein 6.4 (L) 6.5 - 8.1 g/dL   Albumin 3.6 3.5 - 5.0 g/dL   AST 36 15 - 41 U/L   ALT 18 0 - 44 U/L   Alkaline Phosphatase 67 38 - 126 U/L   Total Bilirubin 0.8 0.3 - 1.2 mg/dL   GFR, Estimated 52 (L) >60 mL/min    Comment: (NOTE) Calculated using the CKD-EPI Creatinine Equation (2021)    Anion gap 8 5 - 15    Comment: Performed at [facility] [address] [place], [place] [address]  Protime-INR     Status: None   Collection Time: 09/11/21  9:49 AM  Result Value Ref Range   Prothrombin Time 12.7 11.4 - 15.2 seconds   INR 1.0 0.8 - 1.2    Comment: (NOTE) INR goal varies based on device and disease states. Performed at [facility], [place] [address], [place], [place] [address]   D-dimer, quantitative     Status: Abnormal   Collection Time: 09/11/21  9:50 AM  Result Value Ref Range   D-Dimer, Quant 1.51 (H) 0.00 - 0.50 ug/mL-FEU    Comment: (NOTE) At the manufacturer cut-off value of 0.5 g/mL FEU, this assay has a negative predictive value of 95-100%.This assay is intended for use in conjunction with a clinical pretest probability (PTP) assessment model to exclude pulmonary embolism (PE) and deep venous thrombosis (DVT) in outpatients suspected of PE or DVT. Results should be correlated with clinical presentation. Performed at [facility], [address]  [place], [place] [address]   CBG monitoring, ED     Status: Abnormal   Collection Time: 09/11/21 10:13 AM  Result Value Ref Range   Glucose-Capillary 104 (H) 70 - 99 mg/dL    Comment: Glucose reference range applies only to samples taken after fasting for at least 8 hours.  Resp Panel by RT-PCR (Flu A&B, Covid) Nasopharyngeal Swab     Status: None   Collection Time: 09/11/21 10:33 AM   Specimen: Nasopharyngeal Swab; Nasopharyngeal(NP) swabs in vial transport medium  Result Value Ref Range   SARS Coronavirus 2 by RT PCR NEGATIVE NEGATIVE    Comment: (NOTE) SARS-CoV-2 target nucleic acids are NOT DETECTED.  The SARS-CoV-2 RNA is generally detectable in upper respiratory specimens during the acute phase of infection. The lowest concentration of SARS-CoV-2 viral copies this assay can detect is 138 copies/mL. A negative result does not preclude SARS-Cov-2 infection and should not be used as the sole basis for treatment or other patient management decisions. A negative result may occur with  improper specimen collection/handling, submission of specimen other than nasopharyngeal swab, presence of viral mutation(s) within the areas targeted by this assay, and inadequate number of viral copies(<138 copies/mL). A negative result must be combined with clinical observations, patient history, and epidemiological information. The expected result is Negative.  Fact Sheet for Patients:  EntrepreneurPulse.com.au  Fact Sheet for Healthcare Providers:  IncredibleEmployment.be  This test is no t yet approved or cleared by the [place] FDA and  has been authorized for detection and/or diagnosis of SARS-CoV-2 by FDA under an Emergency Use Authorization (EUA). This EUA will remain  in effect (meaning this test can be used) for the duration of the COVID-19 declaration under Section 564(b)(1) of the Act, 21 U.S.C.section 360bbb-3(b)(1), unless the authorization is  terminated  or revoked sooner.       Influenza A by PCR NEGATIVE NEGATIVE   Influenza B by PCR NEGATIVE NEGATIVE    Comment: (NOTE) The Xpert Xpress SARS-CoV-2/FLU/RSV plus assay is intended as an aid in the diagnosis of influenza from Nasopharyngeal swab specimens and should not be used as a sole basis for treatment. Nasal washings and aspirates are unacceptable for Xpert Xpress SARS-CoV-2/FLU/RSV testing.  Fact Sheet for Patients: EntrepreneurPulse.com.au  Fact Sheet for Healthcare Providers: IncredibleEmployment.be  This test is not yet approved or cleared by the [place] FDA and has been authorized for detection and/or diagnosis of SARS-CoV-2 by FDA under an Emergency Use Authorization (EUA). This EUA will remain in effect (meaning this test can be used) for the duration of the COVID-19 declaration under Section 564(b)(1) of the Act, 21 U.S.C. section 360bbb-3(b)(1), unless the authorization is terminated or revoked.  Performed at [facility], [place] [address] [place], [place] [address]   Troponin I (High Sensitivity)     Status: None   Collection Time: 09/11/21  3:30 PM  Result Value Ref Range   Troponin I (High Sensitivity) 5 <18 ng/L    Comment: (NOTE) Elevated high sensitivity troponin I (hsTnI) values and significant  changes across serial measurements may suggest ACS but many other  chronic and acute conditions are known to elevate hsTnI results.  Refer to the "Links" section for chest pain algorithms and additional  guidance. Performed at [facility], [place] [address], [place], [place] [address]    CT Angio Chest PE W/Cm &/Or Wo Cm  Result Date: 09/11/2021 CLINICAL DATA:  Intense nausea and chest pain. EXAM: CT  ANGIOGRAPHY CHEST WITH CONTRAST TECHNIQUE: Multidetector CT imaging of the chest was performed using the standard protocol during bolus administration of intravenous contrast. Multiplanar CT image  reconstructions and MIPs were obtained to evaluate the vascular anatomy. RADIATION DOSE REDUCTION: This exam was performed according to the departmental dose-optimization program which includes automated exposure control, adjustment of the mA and/or kV according to patient size and/or use of iterative reconstruction technique. CONTRAST:  115mL OMNIPAQUE IOHEXOL 350 MG/ML SOLN COMPARISON:  CT abdomen 11/09/2017 FINDINGS: Cardiovascular: Satisfactory opacification of the pulmonary arteries to the segmental level. No evidence of pulmonary embolism. Mild cardiomegaly. No pericardial effusion. Thoracic aortic atherosclerosis. Coronary artery atherosclerosis. Mediastinum/Nodes: Mildly enlarged precarinal lymph node measuring 10 mm. Subcarinal lymph node measuring 12 mm. Normal trachea. Normal thyroid gland. Esophageal wall thickening as can be seen with esophagitis. Lungs/Pleura: Bibasilar atelectasis and lingular atelectasis. No pleural effusion or pneumothorax. No focal consolidation. Upper Abdomen: No acute abnormality. Musculoskeletal: No acute osseous abnormality. No aggressive osseous lesion. Degenerative disease with disc height loss at T6-7 and T7-8 with reactive endplate sclerosis. Degenerative disease with disc height loss at T11-12. Review of the MIP images confirms the above findings. IMPRESSION: 1. No pulmonary embolism. 2. Esophageal wall thickening as can be seen with esophagitis. 3. Aortic Atherosclerosis (ICD10-I70.0). Electronically Signed   By: Kathreen Devoid M.D.   On: 09/11/2021 12:04   DG Chest Portable 1 View  Result Date: 09/11/2021 CLINICAL DATA:  Chest pain EXAM: PORTABLE CHEST 1 VIEW COMPARISON:  Radiograph 04/23/2020 FINDINGS: The cardiomediastinal silhouette is within normal limits in size. Aortic arch calcifications. There is no focal airspace consolidation. There is no large pleural effusion. No visible pneumothorax. Skin fold overlies the right upper chest. Suture anchor noted in the right  proximal humerus. Bilateral shoulder degenerative changes. No acute osseous abnormality. Cervical spine fusion hardware noted. IMPRESSION: No evidence of acute cardiopulmonary disease. Electronically Signed   By: Maurine Simmering M.D.   On: 09/11/2021 10:34    Review Of Systems Constitutional: No fever, chills, weight loss or gain. Eyes: No vision change, wears glasses. No discharge or pain. Ears: No hearing loss, No tinnitus. Respiratory: No asthma, COPD, pneumonias. Positive shortness of breath. No hemoptysis. Cardiovascular: Positive chest pain, no palpitation, leg edema. Gastrointestinal: Positive nausea, vomiting, no diarrhea, constipation. No GI bleed. No hepatitis. Genitourinary: No dysuria, hematuria, kidney stone. No incontinance. Neurological: No headache, stroke, seizures.  Psychiatry: No psych facility admission for anxiety, depression, suicide. No detox. Skin: No rash. Musculoskeletal: Positive joint pain, no fibromyalgia. Positive neck pain, back pain. Lymphadenopathy: No lymphadenopathy. Hematology: No anemia or easy bruising.   Blood pressure 104/66, pulse 86, temperature (!) 97.3 F (36.3 C), temperature source Axillary, resp. rate 16, SpO2 94 %. There is no height or weight on file to calculate BMI. General appearance: alert, cooperative, appears stated age and no distress Head: Normocephalic, atraumatic. Eyes: Brown eyes, pink conjunctiva, corneas clear. PERRL, EOM's intact. Neck: No adenopathy, no carotid bruit, no JVD, supple, symmetrical, trachea midline and thyroid not enlarged. Resp: Clear to auscultation bilaterally. Cardio: Regular rate and rhythm, S1, S2 normal, II/VI systolic murmur, no click, rub or gallop GI: Soft, non-tender; bowel sounds normal; no organomegaly. Extremities: No edema, cyanosis or clubbing. Skin: Warm and dry.  Neurologic: Alert and oriented X 3, normal strength.  Assessment/Plan Chest pain CAD S/P coronary stents Nausea/Vomiting Acute  pancreatitis HTN HLD S/P pituitary adenoma resection  Plan: Admit. IV fluids. Echocardiogram. Home medications.  Time spent: Review of old records, Lab, x-rays,  EKG, other cardiac tests, examination, discussion with patient/ER doctor over 70 minutes.  Birdie Riddle, MD  09/11/2021, 10:04 PM

## 2021-09-11 NOTE — ED Notes (Signed)
Admitting doc at bedside

## 2021-09-12 ENCOUNTER — Inpatient Hospital Stay (HOSPITAL_COMMUNITY): Payer: Medicare PPO

## 2021-09-12 ENCOUNTER — Other Ambulatory Visit (HOSPITAL_COMMUNITY): Payer: Medicare PPO

## 2021-09-12 LAB — CBC
HCT: 35.7 % — ABNORMAL LOW (ref 36.0–46.0)
Hemoglobin: 12.4 g/dL (ref 12.0–15.0)
MCH: 31.2 pg (ref 26.0–34.0)
MCHC: 34.7 g/dL (ref 30.0–36.0)
MCV: 89.7 fL (ref 80.0–100.0)
Platelets: 308 10*3/uL (ref 150–400)
RBC: 3.98 MIL/uL (ref 3.87–5.11)
RDW: 13.1 % (ref 11.5–15.5)
WBC: 3.7 10*3/uL — ABNORMAL LOW (ref 4.0–10.5)
nRBC: 0 % (ref 0.0–0.2)

## 2021-09-12 LAB — ECHOCARDIOGRAM COMPLETE
AR max vel: 2.73 cm2
AV Area VTI: 2.36 cm2
AV Area mean vel: 2.52 cm2
AV Mean grad: 2 mmHg
AV Peak grad: 3.7 mmHg
Ao pk vel: 0.96 m/s
Area-P 1/2: 2.99 cm2
Calc EF: 64.8 %
Height: 66 in
MV VTI: 2.34 cm2
S' Lateral: 2.2 cm
Single Plane A2C EF: 67.2 %
Single Plane A4C EF: 61.7 %
Weight: 2151.69 oz

## 2021-09-12 LAB — BASIC METABOLIC PANEL
Anion gap: 10 (ref 5–15)
BUN: 8 mg/dL (ref 8–23)
CO2: 25 mmol/L (ref 22–32)
Calcium: 8.8 mg/dL — ABNORMAL LOW (ref 8.9–10.3)
Chloride: 101 mmol/L (ref 98–111)
Creatinine, Ser: 1.07 mg/dL — ABNORMAL HIGH (ref 0.44–1.00)
GFR, Estimated: 51 mL/min — ABNORMAL LOW (ref >60)
Glucose, Bld: 93 mg/dL (ref 70–99)
Potassium: 3.5 mmol/L (ref 3.5–5.1)
Sodium: 136 mmol/L (ref 135–145)

## 2021-09-12 LAB — LIPID PANEL
Cholesterol: 157 mg/dL (ref 0–200)
HDL: 41 mg/dL (ref >40)
LDL Cholesterol: 86 mg/dL (ref 0–99)
Total CHOL/HDL Ratio: 3.8 RATIO
Triglycerides: 152 mg/dL — ABNORMAL HIGH (ref <150)
VLDL: 30 mg/dL (ref 0–40)

## 2021-09-12 MED ORDER — POTASSIUM CHLORIDE CRYS ER 10 MEQ PO TBCR
10.0000 meq | EXTENDED_RELEASE_TABLET | Freq: Every day | ORAL | Status: DC
  Administered 2021-09-12 – 2021-09-13 (×2): 10 meq via ORAL
  Filled 2021-09-12 (×4): qty 1

## 2021-09-12 MED ORDER — ROSUVASTATIN CALCIUM 20 MG PO TABS
20.0000 mg | ORAL_TABLET | Freq: Every day | ORAL | Status: DC
  Administered 2021-09-12: 20 mg via ORAL
  Filled 2021-09-12: qty 1

## 2021-09-12 MED ORDER — LOPERAMIDE HCL 2 MG PO CAPS
4.0000 mg | ORAL_CAPSULE | Freq: Once | ORAL | Status: AC
  Administered 2021-09-12: 4 mg via ORAL
  Filled 2021-09-12: qty 2

## 2021-09-12 MED ORDER — PANTOPRAZOLE SODIUM 20 MG PO TBEC
20.0000 mg | DELAYED_RELEASE_TABLET | Freq: Every day | ORAL | Status: DC
  Administered 2021-09-12 – 2021-09-13 (×2): 20 mg via ORAL
  Filled 2021-09-12 (×2): qty 1

## 2021-09-12 MED ORDER — OMEPRAZOLE MAGNESIUM 20 MG PO TBEC: 20.0000 mg | DELAYED_RELEASE_TABLET | Freq: Every morning | ORAL | Status: DC

## 2021-09-12 MED ORDER — ASPIRIN EC 81 MG PO TBEC
81.0000 mg | DELAYED_RELEASE_TABLET | Freq: Every morning | ORAL | Status: DC
  Administered 2021-09-12 – 2021-09-13 (×2): 81 mg via ORAL
  Filled 2021-09-12 (×2): qty 1

## 2021-09-12 MED ORDER — LOSARTAN POTASSIUM 50 MG PO TABS
50.0000 mg | ORAL_TABLET | Freq: Every morning | ORAL | Status: DC
  Administered 2021-09-12: 50 mg via ORAL
  Filled 2021-09-12 (×2): qty 1

## 2021-09-12 MED ORDER — AMLODIPINE BESYLATE 2.5 MG PO TABS
2.5000 mg | ORAL_TABLET | Freq: Every morning | ORAL | Status: DC
  Administered 2021-09-12: 2.5 mg via ORAL
  Filled 2021-09-12 (×2): qty 1

## 2021-09-12 MED ORDER — MIRTAZAPINE 15 MG PO TABS
15.0000 mg | ORAL_TABLET | Freq: Every day | ORAL | Status: DC
  Administered 2021-09-12: 15 mg via ORAL
  Filled 2021-09-12 (×2): qty 1

## 2021-09-12 MED ORDER — HYDROCODONE-ACETAMINOPHEN 5-325 MG PO TABS: 1.0000 | ORAL_TABLET | Freq: Every day | ORAL | Status: DC | PRN

## 2021-09-12 NOTE — Progress Notes (Signed)
*  PRELIMINARY RESULTS* Echocardiogram 2D Echocardiogram has been performed.  [facility] 09/12/2021, 1:19 PM

## 2021-09-12 NOTE — Plan of Care (Signed)

## 2021-09-12 NOTE — Progress Notes (Signed)
Ref: Dixie Dials, MD   Subjective:  Awake. Nausea controlled. Abdominal pain resolved. VS stable. Echocardiogram shows normal LV systolic function with mild diastolic dysfunction. LV EF 60-65 %.  Objective:  Vital Signs in the last 24 hours: Temp:  [97.4 F (36.3 C)-97.7 F (36.5 C)] 97.7 F (36.5 C) (02/04 1216) Pulse Rate:  [74-100] 83 (02/04 1216) Cardiac Rhythm: Normal sinus rhythm (02/04 1038) Resp:  [13-28] 18 (02/04 1216) BP: (87-119)/(52-73) 111/54 (02/04 1216) SpO2:  [85 %-100 %] 92 % (02/04 1216) Weight:  [61 kg] 61 kg (02/04 0421)  Physical Exam: BP Readings from Last 1 Encounters:  09/12/21 (!) 111/54     Wt Readings from Last 1 Encounters:  09/12/21 61 kg    Weight change:  Body mass index is 21.71 kg/m. HEENT: [place]/AT, Eyes-Brown, wears glasses, Conjunctiva-Pink, Sclera-Non-icteric Neck: No JVD, No bruit, Trachea midline. Lungs:  Clear, Bilateral. Cardiac:  Regular rhythm, normal S1 and S2, no S3. III/VI systolic murmur. Abdomen:  Soft, non-tender. BS present. Extremities:  No edema present. No cyanosis. No clubbing. CNS: AxOx3, Cranial nerves grossly intact, moves all 4 extremities.  Skin: Warm and dry.   Intake/Output from previous day: 02/03 0701 - 02/04 0700 In: 200 [I.V.:200] Out: -     Lab Results: BMET    Component Value Date/Time   NA 136 09/12/2021 0229   NA 135 09/11/2021 0949   NA 129 (L) 04/06/2021 1956   K 3.5 09/12/2021 0229   K 3.3 (L) 09/11/2021 0949   K 3.3 (L) 04/06/2021 1956   CL 101 09/12/2021 0229   CL 101 09/11/2021 0949   CL 97 (L) 04/06/2021 1956   CO2 25 09/12/2021 0229   CO2 26 09/11/2021 0949   CO2 22 04/06/2021 1956   GLUCOSE 93 09/12/2021 0229   GLUCOSE 98 09/11/2021 0949   GLUCOSE 102 (H) 04/06/2021 1956   BUN 8 09/12/2021 0229   BUN 8 09/11/2021 0949   BUN 8 04/06/2021 1956   CREATININE 1.07 (H) 09/12/2021 0229   CREATININE 1.04 (H) 09/11/2021 2245   CREATININE 1.06 (H) 09/11/2021 0949   CALCIUM 8.8  (L) 09/12/2021 0229   CALCIUM 8.9 09/11/2021 0949   CALCIUM 9.0 04/06/2021 1956   GFRNONAA 51 (L) 09/12/2021 0229   GFRNONAA 53 (L) 09/11/2021 2245   GFRNONAA 52 (L) 09/11/2021 0949   GFRAA 46 (L) 11/15/2017 0439   GFRAA 40 (L) 11/14/2017 1250   GFRAA 56 (L) 11/09/2017 1341   CBC    Component Value Date/Time   WBC 3.7 (L) 09/12/2021 0229   RBC 3.98 09/12/2021 0229   HGB 12.4 09/12/2021 0229   HCT 35.7 (L) 09/12/2021 0229   PLT 308 09/12/2021 0229   MCV 89.7 09/12/2021 0229   MCH 31.2 09/12/2021 0229   MCHC 34.7 09/12/2021 0229   RDW 13.1 09/12/2021 0229   LYMPHSABS 1.8 04/06/2021 1956   MONOABS 0.3 04/06/2021 1956   EOSABS 0.2 04/06/2021 1956   BASOSABS 0.0 04/06/2021 1956   HEPATIC Function Panel Recent Labs    04/06/21 1956 09/11/21 0949  PROT 6.4* 6.4*   HEMOGLOBIN A1C No components found for: HGA1C,  MPG CARDIAC ENZYMES Lab Results  Component Value Date   CKTOTAL 323 (H) 11/15/2017   BNP No results for input(s): PROBNP in the last 8760 hours. TSH No results for input(s): TSH in the last 8760 hours. CHOLESTEROL Recent Labs    09/12/21 0229  CHOL 157    Scheduled Meds:  amLODipine  2.5 mg Oral  q morning   aspirin EC  81 mg Oral q morning   enoxaparin (LOVENOX) injection  40 mg Subcutaneous Q24H   losartan  50 mg Oral q morning   mirtazapine  15 mg Oral QHS   pantoprazole  20 mg Oral Daily   rosuvastatin  20 mg Oral QHS   Continuous Infusions:  sodium chloride 50 mL/hr at 09/12/21 0300   PRN Meds:.HYDROcodone-acetaminophen, nitroGLYCERIN  Assessment/Plan:  Acute pancreatitis Nausea/vomiting Chest pain HTN CAD S/P coronary stent HLD S/P pituitary adenoma resection  Plan: Advance diet. Consider cardiac cath sooner if chest pain is recurrent.  Potassium supplement.                                                    LOS: 1 day   Time spent including chart review, lab review, examination, discussion with patient/Family : 30 min   Dixie Dials  MD  09/12/2021, 2:34 PM

## 2021-09-13 LAB — BASIC METABOLIC PANEL
Anion gap: 10 (ref 5–15)
BUN: 8 mg/dL (ref 8–23)
CO2: 21 mmol/L — ABNORMAL LOW (ref 22–32)
Calcium: 8.8 mg/dL — ABNORMAL LOW (ref 8.9–10.3)
Chloride: 105 mmol/L (ref 98–111)
Creatinine, Ser: 1.01 mg/dL — ABNORMAL HIGH (ref 0.44–1.00)
GFR, Estimated: 55 mL/min — ABNORMAL LOW (ref >60)
Glucose, Bld: 72 mg/dL (ref 70–99)
Potassium: 3.5 mmol/L (ref 3.5–5.1)
Sodium: 136 mmol/L (ref 135–145)

## 2021-09-13 MED ORDER — POTASSIUM CHLORIDE CRYS ER 10 MEQ PO TBCR
10.0000 meq | EXTENDED_RELEASE_TABLET | Freq: Every day | ORAL | 6 refills | Status: DC
  Filled 2021-09-13: qty 30, 30d supply, fill #0

## 2021-09-13 MED ORDER — NITROGLYCERIN 0.4 MG SL SUBL
0.4000 mg | SUBLINGUAL_TABLET | SUBLINGUAL | 0 refills | Status: DC | PRN
  Filled 2021-09-13: qty 25, 7d supply, fill #0

## 2021-09-13 NOTE — TOC Transition Note (Signed)
Transition of Care [facility]) - CM/SW Discharge Note   Patient Details  Name: Adriana Jordan MRN: [id number] Date of Birth: Dec 16, 1934  [facility]) CM/SW Contact:  Zenon Mayo, RN Phone Number: 09/13/2021, 2:17 PM   Clinical Narrative:    Patient is for dc, has no needs.         Patient Goals and CMS Choice        Discharge Placement                       Discharge Plan and Services                                     Social Determinants of Health (SDOH) Interventions     Readmission Risk Interventions No flowsheet data found.

## 2021-09-13 NOTE — Plan of Care (Signed)
  Problem: Elimination: Goal: Will not experience complications related to bowel motility Outcome: Adequate for Discharge Goal: Will not experience complications related to urinary retention Outcome: Adequate for Discharge   Problem: Pain Managment: Goal: General experience of comfort will improve Outcome: Adequate for Discharge   Problem: Safety: Goal: Ability to remain free from injury will improve Outcome: Adequate for Discharge   Problem: Skin Integrity: Goal: Risk for impaired skin integrity will decrease Outcome: Adequate for Discharge   

## 2021-09-13 NOTE — Discharge Summary (Signed)
Physician Discharge Summary  Patient ID: Adriana Jordan MRN: [id number] DOB/AGE: 03/01/1935 87 y.o.  Admit date: 09/11/2021 Discharge date: 09/13/2021  Admission Diagnoses: Chest pain CAD S/P coronary stents Nausea/Vomiting Acute pancreatitis HTN HLD S/P pituitary adenoma resection  Discharge Diagnoses:  Principal Problem: Acute pancreatitis Active problem:  Chest pain, atypical  CAD  S/P Coronary stents  Nausea/Vomiting, resolved  HTN  HLD  S/P pituitary adenoma resection  Osteoarthritis  Discharged Condition: fair  Hospital Course: 87 years old female with PMH of pituitary adenoma resection, HTN, HLD, CD, S/P stents and arthritis had chest pain as pressure type and responding to SL NTG use. Her HS-troponin I were normal times 2. Her CT angio chest was negative for PE and her chest x-ray was unremarkable. She was treated with IV fluids + clear liquid diet followed by soft diet and then heart healthy diet. Her chest pain did not recur and she tolerated oral feeding without abdominal pain or nausea/vomiting.  She was offered cardiac catheterization but she wants to wait few weeks. She was discharged home in stable condition with f/u by me in 1 week and primary care in 1 month.  Consults: cardiology  Significant Diagnostic Studies: labs: CBC, BMET, HS-troponin I and Lipid panel were essentially unremarkable.  EKG: NSR, Left Anterior HB.  CXR: Unremarkable.  CT angio chest : Negative for PE. Positive coronary and aortic atherosclerosis.  Treatments: IV hydration.  Discharge Exam: Blood pressure 90/71, pulse 78, temperature 97.9 F (36.6 C), temperature source Oral, resp. rate 16, height 5\' 6"  (1.676 m), weight 60.9 kg, SpO2 92 %. General appearance: alert, cooperative and appears stated age. Head: Normocephalic, atraumatic. Eyes: Brown eyes, wears glasses, pink conjunctiva, corneas clear.   Neck: No adenopathy, no carotid bruit, no JVD, supple, symmetrical, trachea  midline and thyroid not enlarged. Resp: Clear to auscultation bilaterally. Cardio: Regular rate and rhythm, S1, S2 normal, II/VI systolic murmur, no click, rub or gallop. GI: Soft, non-tender; bowel sounds normal; no organomegaly. Extremities: No edema, cyanosis or clubbing. Skin: Warm and dry.  Neurologic: Alert and oriented X 3, normal strength and tone. Normal coordination and slow gait.  Disposition: Discharge disposition: 01-Home or Self Care        Allergies as of 09/13/2021       Reactions   Codeine Nausea And Vomiting   Tolerates hydrocodone        Medication List     TAKE these medications    amLODipine 2.5 MG tablet Commonly known as: NORVASC Take 2.5 mg by mouth every morning.   aspirin EC 81 MG tablet Take 81 mg by mouth every morning. Swallow whole.   HAIR SKIN & NAILS GUMMIES PO Take 1 tablet by mouth 2 (two) times daily.   HYDROcodone-acetaminophen 5-325 MG tablet Commonly known as: NORCO/VICODIN Take 1 tablet by mouth daily as needed (pain).   ibuprofen 200 MG tablet Commonly known as: ADVIL Take 400 mg by mouth daily as needed for headache.   losartan 50 MG tablet Commonly known as: COZAAR Take 50 mg by mouth every morning.   mirtazapine 15 MG tablet Commonly known as: REMERON Take 15 mg by mouth at bedtime.   nitroGLYCERIN 0.4 MG SL tablet Commonly known as: NITROSTAT Place 1 tablet (0.4 mg total) under the tongue every 5 (five) minutes x 3 doses as needed for chest pain.   omeprazole 20 MG tablet Commonly known as: PRILOSEC OTC Take 20 mg by mouth every morning.   potassium chloride 10 MEQ tablet  Commonly known as: KLOR-CON M Take 1 tablet (10 mEq total) by mouth daily. Start taking on: September 14, 2021   PreserVision AREDS 2 Caps Take 1 capsule by mouth 2 (two) times daily.   PREVAGEN PO Take 1 tablet by mouth every morning.   rosuvastatin 20 MG tablet Commonly known as: CRESTOR Take 20 mg by mouth at bedtime.         Follow-up Information     Dixie Dials, MD. Schedule an appointment as soon as possible for a visit in 1 week(s).   Specialty: Cardiology Contact information: [address] [place] [address] [phone]                 Time spent: Review of old chart, current chart, lab, x-ray, cardiac tests and discussion with patient/Nurse over 60 minutes.  Signed: Birdie Riddle 09/13/2021, 2:05 PM

## 2021-09-13 NOTE — TOC Progression Note (Signed)
Transition of Care [facility]) - Progression Note    Patient Details  Name: CALIANN LECKRONE MRN: [id number] Date of Birth: 05-Sep-1934  [facility]) CM/SW Contact  Zenon Mayo, RN Phone Number: 09/13/2021, 8:50 AM  Clinical Narrative:     Transition of Care [facility]) Screening Note   Patient Details  Name: MELONY TENPAS Date of Birth: 1935-05-09   Transition of Care [facility]) CM/SW Contact:    Zenon Mayo, RN Phone Number: 09/13/2021, 8:51 AM    Transition of Care Department [facility]) has reviewed patient and no TOC needs have been identified at this time. We will continue to monitor patient advancement through interdisciplinary progression rounds. If new patient transition needs arise, please place a TOC consult.          Expected Discharge Plan and Services                                                 Social Determinants of Health (SDOH) Interventions    Readmission Risk Interventions No flowsheet data found.

## 2021-09-14 ENCOUNTER — Other Ambulatory Visit (HOSPITAL_COMMUNITY): Payer: Self-pay

## 2021-09-26 ENCOUNTER — Emergency Department (HOSPITAL_COMMUNITY)
Admission: EM | Admit: 2021-09-26 | Discharge: 2021-09-26 | Disposition: A | Discharge status: Home / Self Care | Payer: Medicare PPO | Attending: Emergency Medicine | Admitting: Emergency Medicine

## 2021-09-26 ENCOUNTER — Other Ambulatory Visit: Payer: Self-pay

## 2021-09-26 ENCOUNTER — Emergency Department (HOSPITAL_COMMUNITY): Payer: Medicare PPO

## 2021-09-26 ENCOUNTER — Encounter (HOSPITAL_COMMUNITY): Payer: Self-pay

## 2021-09-26 DIAGNOSIS — R112 Nausea with vomiting, unspecified: Secondary | ICD-10-CM | POA: Diagnosis not present

## 2021-09-26 DIAGNOSIS — R1012 Left upper quadrant pain: Secondary | ICD-10-CM

## 2021-09-26 DIAGNOSIS — Z7982 Long term (current) use of aspirin: Secondary | ICD-10-CM | POA: Insufficient documentation

## 2021-09-26 DIAGNOSIS — Z79899 Other long term (current) drug therapy: Secondary | ICD-10-CM | POA: Insufficient documentation

## 2021-09-26 LAB — CBC WITH DIFFERENTIAL/PLATELET
Abs Immature Granulocytes: 0 10*3/uL (ref 0.00–0.07)
Basophils Absolute: 0 10*3/uL (ref 0.0–0.1)
Basophils Relative: 1 %
Eosinophils Absolute: 0.4 10*3/uL (ref 0.0–0.5)
Eosinophils Relative: 12 %
HCT: 35.7 % — ABNORMAL LOW (ref 36.0–46.0)
Hemoglobin: 12 g/dL (ref 12.0–15.0)
Lymphocytes Relative: 43 %
Lymphs Abs: 1.5 10*3/uL (ref 0.7–4.0)
MCH: 30.5 pg (ref 26.0–34.0)
MCHC: 33.6 g/dL (ref 30.0–36.0)
MCV: 90.8 fL (ref 80.0–100.0)
Monocytes Absolute: 0.5 10*3/uL (ref 0.1–1.0)
Monocytes Relative: 16 %
Neutro Abs: 1 10*3/uL — ABNORMAL LOW (ref 1.7–7.7)
Neutrophils Relative %: 28 %
Platelets: 306 10*3/uL (ref 150–400)
RBC: 3.93 MIL/uL (ref 3.87–5.11)
RDW: 12.5 % (ref 11.5–15.5)
WBC: 3.4 10*3/uL — ABNORMAL LOW (ref 4.0–10.5)
nRBC: 0 % (ref 0.0–0.2)
nRBC: 0 /100 WBC

## 2021-09-26 LAB — COMPREHENSIVE METABOLIC PANEL
ALT: 16 U/L (ref 0–44)
AST: 35 U/L (ref 15–41)
Albumin: 3.5 g/dL (ref 3.5–5.0)
Alkaline Phosphatase: 66 U/L (ref 38–126)
Anion gap: 8 (ref 5–15)
BUN: 8 mg/dL (ref 8–23)
CO2: 26 mmol/L (ref 22–32)
Calcium: 9.3 mg/dL (ref 8.9–10.3)
Chloride: 100 mmol/L (ref 98–111)
Creatinine, Ser: 0.99 mg/dL (ref 0.44–1.00)
GFR, Estimated: 56 mL/min — ABNORMAL LOW (ref >60)
Glucose, Bld: 104 mg/dL — ABNORMAL HIGH (ref 70–99)
Potassium: 3.5 mmol/L (ref 3.5–5.1)
Sodium: 134 mmol/L — ABNORMAL LOW (ref 135–145)
Total Bilirubin: 0.2 mg/dL — ABNORMAL LOW (ref 0.3–1.2)
Total Protein: 6.5 g/dL (ref 6.5–8.1)

## 2021-09-26 LAB — URINALYSIS, ROUTINE W REFLEX MICROSCOPIC
Bilirubin Urine: NEGATIVE
Glucose, UA: NEGATIVE mg/dL
Hgb urine dipstick: NEGATIVE
Ketones, ur: NEGATIVE mg/dL
Leukocytes,Ua: NEGATIVE
Nitrite: NEGATIVE
Protein, ur: NEGATIVE mg/dL
Specific Gravity, Urine: 1.027 (ref 1.005–1.030)
pH: 7 (ref 5.0–8.0)

## 2021-09-26 LAB — TROPONIN I (HIGH SENSITIVITY)
Troponin I (High Sensitivity): 6 ng/L (ref <18)
Troponin I (High Sensitivity): 5 ng/L (ref <18)

## 2021-09-26 LAB — LIPASE, BLOOD: Lipase: 53 U/L — ABNORMAL HIGH (ref 11–51)

## 2021-09-26 MED ORDER — MORPHINE SULFATE (PF) 4 MG/ML IV SOLN
3.0000 mg | Freq: Once | INTRAVENOUS | Status: AC
  Administered 2021-09-26: 3 mg via INTRAVENOUS
  Filled 2021-09-26: qty 1

## 2021-09-26 MED ORDER — OMEPRAZOLE MAGNESIUM 20 MG PO TBEC
20.0000 mg | DELAYED_RELEASE_TABLET | Freq: Every morning | ORAL | 0 refills | Status: DC
Start: 2021-09-26 — End: 2021-12-19

## 2021-09-26 MED ORDER — IOHEXOL 300 MG/ML  SOLN
75.0000 mL | Freq: Once | INTRAMUSCULAR | Status: AC | PRN
  Administered 2021-09-26: 75 mL via INTRAVENOUS

## 2021-09-26 MED ORDER — ALUM & MAG HYDROXIDE-SIMETH 200-200-20 MG/5ML PO SUSP
30.0000 mL | Freq: Once | ORAL | Status: AC
  Administered 2021-09-26: 30 mL via ORAL
  Filled 2021-09-26: qty 30

## 2021-09-26 NOTE — ED Notes (Signed)
Reviewed discharge instructions with patient. Follow-up care and medications reviewed. Patient  verbalized understanding. Patient A&Ox4, VSS, and ambulatory with steady gait upon discharge.  °

## 2021-09-26 NOTE — ED Notes (Signed)
Pt transported to CT ?

## 2021-09-26 NOTE — ED Triage Notes (Addendum)
Pt arrived to ED via EMS w/ C/O LUQ pain x 2 days w/ vomiting and L flank pain. Pt is from home and A&Ox4 and VS WNL w/ EMS. Pt in NAD at this time.

## 2021-09-26 NOTE — ED Provider Notes (Signed)
[place] EMERGENCY DEPARTMENT Provider Note   CSN: [id number] Arrival date & time: 09/26/21  0759  History/HPI:  Chief Complaint  Patient presents with   Abdominal Pain    Adriana Jordan who presents today with complaint of abdominal pain.  Patient reports having 3 days of left upper quadrant and left flank pain.  She has had associated nausea and vomiting.  No urinary symptoms.  No constipation.  No fevers at home.  She does have a history of abdominal surgery.  No alcohol or drug use.  No tobacco use.  Denies chest pain.  No shortness of breath.  Home Meds: Prior to Admission medications   Medication Sig Start Date End Date Taking? Authorizing Provider  amLODipine (NORVASC) 2.5 MG tablet Take 2.5 mg by mouth every morning.    [provider]  Apoaequorin (PREVAGEN PO) Take 1 tablet by mouth every morning.    [provider]  aspirin EC 81 MG tablet Take 81 mg by mouth every morning. Swallow whole.    [provider]  Biotin w/ Vitamins C & E (HAIR SKIN & NAILS GUMMIES PO) Take 1 tablet by mouth 2 (two) times daily.    [provider]  HYDROcodone-acetaminophen (NORCO/VICODIN) 5-325 MG tablet Take 1 tablet by mouth daily as needed (pain). 03/05/20   [provider]  ibuprofen (ADVIL) 200 MG tablet Take 400 mg by mouth daily as needed for headache.    [provider]  losartan (COZAAR) 50 MG tablet Take 50 mg by mouth every morning.    [provider]  mirtazapine (REMERON) 15 MG tablet Take 15 mg by mouth at bedtime. 09/10/21   [provider]  Multiple Vitamins-Minerals (PRESERVISION AREDS 2) CAPS Take 1 capsule by mouth 2 (two) times daily.    [provider]  nitroGLYCERIN (NITROSTAT) 0.4 MG SL tablet Place 1 tablet (0.4 mg total) under the tongue every 5 (five) minutes x 3 doses as needed for chest pain. 09/13/21   Dixie Dials, MD  omeprazole (PRILOSEC OTC) 20 MG tablet Take 1 tablet  (20 mg total) by mouth every morning. 09/26/21 10/26/21  Jacelyn Pi, MD  potassium chloride (KLOR-CON M) 10 MEQ tablet Take 1 tablet (10 mEq total) by mouth daily. 09/14/21   Dixie Dials, MD  rosuvastatin (CRESTOR) 20 MG tablet Take 20 mg by mouth at bedtime. 07/17/21   [provider]    Allergies: Codeine  ROS: Review of Systems  Constitutional:  Negative for chills, fever and weight loss.  Respiratory:  Negative for shortness of breath.   Cardiovascular:  Negative for chest pain and leg swelling.  Gastrointestinal:  Positive for abdominal pain, nausea and vomiting. Negative for constipation, diarrhea and melena.  Genitourinary:  Negative for dysuria.  Musculoskeletal:  Negative for myalgias.  Neurological:  Negative for weakness.   Physical Exam: Physical Exam Constitutional:      General: She is not in acute distress.    Appearance: She is well-developed. She is not ill-appearing or toxic-appearing.  Cardiovascular:     Rate and Rhythm: Normal rate and regular rhythm.  Pulmonary:     Effort: No respiratory distress.     Breath sounds: No wheezing.  Abdominal:     General: Abdomen is flat and scaphoid.     Tenderness: There is generalized abdominal tenderness and tenderness in the left upper quadrant.  Neurological:     Mental Status: She is alert.    Procedures: Procedures   MDM/ED Course:  This patient presents to the ED for concern of abdominal pain, this involves an extensive number of treatment options, and is a complaint that carries with it a high risk of complications and morbidity.  The differential diagnosis includes pancreatitis, kidney stone, diverticulitis, pneumonia, atypical chest pain. Patients presentation is complicated by their history of ACS, CAD  Additional history obtained: Records reviewed previous admission documents and Primary Care Documents  Lab Tests: I Ordered, and personally interpreted labs.  The pertinent results include:   lipase decreased from previous. No leukocytosis or electrolyte abnormalities  Imaging Studies ordered: I ordered imaging studies including CT scan Abdomen/pelvis and X-ray of chest   I independently visualized and interpreted imaging which showed CXR was reassuring with no acute findings.  CT abdomen pelvis was reassuring.  Negative for any acute abnormalities 11 diverticulosis. I agree with the radiologist interpretation  EKG (personally reviewed and interpreted): Negative for STEMI or ischemia.  Medical Decision Making: Patient presented with abdominal pain.  Patient with recent admission for questionable pancreatitis.  Lipase was downtrending today.  CT scan did not show any evidence for pancreatitis.  No kidney stones on CT scan.  No diverticulitis.  Patient is hemodynamically stable.  Work-up was reassuring.  She continued to have mild pain.  She was given a small dose of morphine with good pain relief.  She also tolerated p.o.  She was presented with concern for chest pain.  Troponins were collected to ensure this was not an atypical presentation for ACS.  Troponins were negative.  EKG was reassuring.  Etiology remains unclear, however could be related to GERD versus reflux versus gastritis.  Lengthy discussion with patient regarding negative work-up.  She was feeling better.  We will plan for discharge home.  I discussed return precautions with the patient including the importance of close outpatient follow-up.  She voiced understanding and agreement this plan.  Complexity of problems addressed: Patients presentation is most consistent with  acute presentation with potential threat to life or bodily function  Disposition: After consideration of the diagnostic results and the patients response to treatment,  I feel that the patent would benefit from discharge home .   Patient seen in conjunction with my attending, Dr. Alvino Chapel.  Clinical Impression/Dx: Final diagnoses:  Left upper  quadrant abdominal pain     Rx/Dc Orders: ED Discharge Orders          Ordered    omeprazole (PRILOSEC OTC) 20 MG tablet  Every morning        09/26/21 1354                 Jacelyn Pi, MD 09/26/21 1516    Davonna Belling, MD 09/27/21 [phone]

## 2021-09-26 NOTE — ED Notes (Signed)
Notified pt of need for urine specimen

## 2021-12-19 ENCOUNTER — Emergency Department (HOSPITAL_COMMUNITY): Payer: Medicare PPO

## 2021-12-19 ENCOUNTER — Emergency Department (HOSPITAL_COMMUNITY)
Admission: EM | Admit: 2021-12-19 | Discharge: 2021-12-19 | Disposition: A | Discharge status: Home / Self Care | Payer: Medicare PPO | Attending: Emergency Medicine | Admitting: Emergency Medicine

## 2021-12-19 ENCOUNTER — Encounter (HOSPITAL_COMMUNITY): Payer: Self-pay | Admitting: Emergency Medicine

## 2021-12-19 ENCOUNTER — Other Ambulatory Visit: Payer: Self-pay

## 2021-12-19 DIAGNOSIS — Z79899 Other long term (current) drug therapy: Secondary | ICD-10-CM | POA: Diagnosis not present

## 2021-12-19 DIAGNOSIS — R1032 Left lower quadrant pain: Secondary | ICD-10-CM | POA: Diagnosis present

## 2021-12-19 DIAGNOSIS — R110 Nausea: Secondary | ICD-10-CM | POA: Diagnosis not present

## 2021-12-19 DIAGNOSIS — R1012 Left upper quadrant pain: Secondary | ICD-10-CM

## 2021-12-19 DIAGNOSIS — Z7982 Long term (current) use of aspirin: Secondary | ICD-10-CM | POA: Insufficient documentation

## 2021-12-19 DIAGNOSIS — I2510 Atherosclerotic heart disease of native coronary artery without angina pectoris: Secondary | ICD-10-CM | POA: Insufficient documentation

## 2021-12-19 DIAGNOSIS — I10 Essential (primary) hypertension: Secondary | ICD-10-CM | POA: Insufficient documentation

## 2021-12-19 DIAGNOSIS — R7309 Other abnormal glucose: Secondary | ICD-10-CM | POA: Insufficient documentation

## 2021-12-19 LAB — CBC WITH DIFFERENTIAL/PLATELET
Abs Immature Granulocytes: 0.01 10*3/uL (ref 0.00–0.07)
Basophils Absolute: 0 10*3/uL (ref 0.0–0.1)
Basophils Relative: 1 %
Eosinophils Absolute: 0.3 10*3/uL (ref 0.0–0.5)
Eosinophils Relative: 7 %
HCT: 38 % (ref 36.0–46.0)
Hemoglobin: 13 g/dL (ref 12.0–15.0)
Immature Granulocytes: 0 %
Lymphocytes Relative: 52 %
Lymphs Abs: 1.9 10*3/uL (ref 0.7–4.0)
MCH: 30.9 pg (ref 26.0–34.0)
MCHC: 34.2 g/dL (ref 30.0–36.0)
MCV: 90.3 fL (ref 80.0–100.0)
Monocytes Absolute: 0.5 10*3/uL (ref 0.1–1.0)
Monocytes Relative: 14 %
Neutro Abs: 1 10*3/uL — ABNORMAL LOW (ref 1.7–7.7)
Neutrophils Relative %: 26 %
Platelets: 327 10*3/uL (ref 150–400)
RBC: 4.21 MIL/uL (ref 3.87–5.11)
RDW: 13.6 % (ref 11.5–15.5)
WBC: 3.7 10*3/uL — ABNORMAL LOW (ref 4.0–10.5)
nRBC: 0 % (ref 0.0–0.2)

## 2021-12-19 LAB — COMPREHENSIVE METABOLIC PANEL
ALT: 14 U/L (ref 0–44)
AST: 29 U/L (ref 15–41)
Albumin: 3.9 g/dL (ref 3.5–5.0)
Alkaline Phosphatase: 57 U/L (ref 38–126)
Anion gap: 8 (ref 5–15)
BUN: 10 mg/dL (ref 8–23)
CO2: 24 mmol/L (ref 22–32)
Calcium: 9.2 mg/dL (ref 8.9–10.3)
Chloride: 101 mmol/L (ref 98–111)
Creatinine, Ser: 1.06 mg/dL — ABNORMAL HIGH (ref 0.44–1.00)
GFR, Estimated: 51 mL/min — ABNORMAL LOW (ref >60)
Glucose, Bld: 106 mg/dL — ABNORMAL HIGH (ref 70–99)
Potassium: 3.3 mmol/L — ABNORMAL LOW (ref 3.5–5.1)
Sodium: 133 mmol/L — ABNORMAL LOW (ref 135–145)
Total Bilirubin: 0.8 mg/dL (ref 0.3–1.2)
Total Protein: 6.4 g/dL — ABNORMAL LOW (ref 6.5–8.1)

## 2021-12-19 LAB — URINALYSIS, ROUTINE W REFLEX MICROSCOPIC
Bilirubin Urine: NEGATIVE
Glucose, UA: NEGATIVE mg/dL
Hgb urine dipstick: NEGATIVE
Ketones, ur: NEGATIVE mg/dL
Leukocytes,Ua: NEGATIVE
Nitrite: NEGATIVE
Protein, ur: NEGATIVE mg/dL
Specific Gravity, Urine: 1.009 (ref 1.005–1.030)
pH: 6 (ref 5.0–8.0)

## 2021-12-19 LAB — TROPONIN I (HIGH SENSITIVITY)
Troponin I (High Sensitivity): 5 ng/L (ref <18)
Troponin I (High Sensitivity): 6 ng/L (ref <18)

## 2021-12-19 LAB — CBG MONITORING, ED: Glucose-Capillary: 105 mg/dL — ABNORMAL HIGH (ref 70–99)

## 2021-12-19 LAB — LIPASE, BLOOD: Lipase: 44 U/L (ref 11–51)

## 2021-12-19 MED ORDER — PANTOPRAZOLE SODIUM 40 MG PO TBEC: 40.0000 mg | DELAYED_RELEASE_TABLET | Freq: Every day | ORAL | Status: DC

## 2021-12-19 MED ORDER — LIDOCAINE VISCOUS HCL 2 % MT SOLN
15.0000 mL | Freq: Once | OROMUCOSAL | Status: AC
  Administered 2021-12-19: 15 mL via ORAL
  Filled 2021-12-19: qty 15

## 2021-12-19 MED ORDER — HYDROCODONE-ACETAMINOPHEN 5-325 MG PO TABS
2.0000 | ORAL_TABLET | Freq: Once | ORAL | Status: AC
  Administered 2021-12-19: 2 via ORAL
  Filled 2021-12-19: qty 2

## 2021-12-19 MED ORDER — NALOXONE HCL 0.4 MG/ML IJ SOLN
0.4000 mg | Freq: Once | INTRAMUSCULAR | Status: AC
  Administered 2021-12-19: 0.4 mg via INTRAVENOUS
  Filled 2021-12-19: qty 1

## 2021-12-19 MED ORDER — ALUM & MAG HYDROXIDE-SIMETH 200-200-20 MG/5ML PO SUSP
30.0000 mL | Freq: Once | ORAL | Status: AC
  Administered 2021-12-19: 30 mL via ORAL
  Filled 2021-12-19: qty 30

## 2021-12-19 MED ORDER — DICYCLOMINE HCL 10 MG/ML IM SOLN
20.0000 mg | Freq: Once | INTRAMUSCULAR | Status: AC
  Administered 2021-12-19: 20 mg via INTRAMUSCULAR
  Filled 2021-12-19: qty 2

## 2021-12-19 MED ORDER — IOHEXOL 350 MG/ML SOLN
80.0000 mL | Freq: Once | INTRAVENOUS | Status: AC | PRN
  Administered 2021-12-19: 80 mL via INTRAVENOUS

## 2021-12-19 MED ORDER — DICYCLOMINE HCL 20 MG PO TABS: 20.0000 mg | ORAL_TABLET | Freq: Two times a day (BID) | ORAL | 0 refills | Status: DC

## 2021-12-19 MED ORDER — PANTOPRAZOLE SODIUM 20 MG PO TBEC: 40.0000 mg | DELAYED_RELEASE_TABLET | Freq: Every day | ORAL | 0 refills | Status: DC

## 2021-12-19 NOTE — ED Notes (Signed)
Pt ambulated to the nurses station and back without difficulties. Pt did have standby assistance just in case of sudden weakness. Pt given coffee, water, and crackers. Breakfast ordered  ?

## 2021-12-19 NOTE — ED Notes (Signed)
Pt tolerated PO liquids and crackers well. Pt is agreeable to discharge plan.  ?

## 2021-12-19 NOTE — ED Notes (Signed)
Patient brought back from triage, pale and diaphoretic.  Narcan given d/t decreased respiratory effort and responsiveness.  Patient now alert and c/o the pain in her abdomen again.  ?

## 2021-12-19 NOTE — ED Provider Triage Note (Signed)
?  Emergency Medicine Provider Triage Evaluation Note ? ?MRN:  [id number]  ?Arrival date & time: 12/19/21    ?Medically screening exam initiated at 3:12 AM.   ?CC:   ?Abdominal Pain ?  ?HPI:  ?Adriana Jordan is a 87 y.o. year-old female presents to the ED with chief complaint of left lower abdominal pain.  Rates pain as severe.  Symptoms started 3 days ago.  Denies diarrhea or dysuria.  Denies hx of diverticulitis or kidney stone. ? ?History provided by patient. ?ROS:  ?-As included in HPI ?PE:  ? ?Vitals:  ? 12/19/21 0310  ?BP: (!) 152/71  ?Pulse: 75  ?Resp: 20  ?Temp: 98.1 ?F (36.7 ?C)  ?SpO2: 98%  ?  ?Non-toxic appearing ?No respiratory distress ? ?MDM:  ?Based on signs and symptoms, diverticulitis vs kidney stone is highest on my differential, followed by UTI. ?I've ordered labs and imaging in triage to expedite lab/diagnostic workup. ? ?Patient was informed that the remainder of the evaluation will be completed by another provider, this initial triage assessment does not replace that evaluation, and the importance of remaining in the ED until their evaluation is complete. ? ?  ?Montine Circle, PA-C ?12/19/21 0320 ? ?

## 2021-12-19 NOTE — ED Provider Notes (Signed)
?[place] ?Provider Note ? ? ?CSN: [id number] ?Arrival date & time: 12/19/21  0308 ? ?  ? ?History ? ?Chief Complaint  ?Patient presents with  ? Abdominal Pain  ? ? ?Adriana Jordan is a 87 y.o. female. ? ?The history is provided by the patient and medical records.  ?Abdominal Pain ?Adriana Jordan is a 87 y.o. female who presents to the Emergency Department complaining of abdominal pain.  She presents to the ED for evaluation of three days of severe LLQ abdominal pain that radiates to her back.  Has nausea.  No fever, vomiting, diarrhea.  Sxs are severe, constant, worsening.  ?  ? ?Home Medications ?Prior to Admission medications   ?Medication Sig Start Date End Date Taking? Authorizing Provider  ?Apoaequorin (PREVAGEN PO) Take 1 tablet by mouth every morning.   Yes [provider]  ?aspirin 325 MG tablet Take 325 mg by mouth daily.   Yes [provider]  ?Biotin w/ Vitamins C & E (HAIR SKIN & NAILS GUMMIES PO) Take 1 tablet by mouth daily.   Yes [provider]  ?cetirizine (ZYRTEC) 10 MG tablet Take 10 mg by mouth daily as needed for allergies.   Yes [provider]  ?HYDROcodone-acetaminophen (NORCO/VICODIN) 5-325 MG tablet Take 1 tablet by mouth daily as needed (pain). 03/05/20  Yes [provider]  ?ibuprofen (ADVIL) 200 MG tablet Take 400 mg by mouth daily as needed for headache.   Yes [provider]  ?losartan (COZAAR) 50 MG tablet Take 50 mg by mouth every morning.   Yes [provider]  ?metoprolol succinate (TOPROL-XL) 25 MG 24 hr tablet Take 25 mg by mouth daily. 09/17/21  Yes [provider]  ?mirtazapine (REMERON) 15 MG tablet Take 15 mg by mouth at bedtime. 09/10/21  Yes [provider]  ?nitroGLYCERIN (NITROSTAT) 0.4 MG SL tablet Place 1 tablet (0.4 mg total) under the tongue every 5 (five) minutes x 3 doses as needed for chest pain. 09/13/21  Yes Dixie Dials, MD  ?omeprazole  (PRILOSEC OTC) 20 MG tablet Take 1 tablet (20 mg total) by mouth every morning. 09/26/21 12/19/21 Yes Jacelyn Pi, MD  ?rosuvastatin (CRESTOR) 20 MG tablet Take 20 mg by mouth at bedtime. 07/17/21  Yes [provider]  ?potassium chloride (KLOR-CON M) 10 MEQ tablet Take 1 tablet (10 mEq total) by mouth daily. ?Patient not taking: Reported on 12/19/2021 09/14/21   Dixie Dials, MD  ?   ? ?Allergies    ?Codeine   ? ?Review of Systems   ?Review of Systems  ?Gastrointestinal:  Positive for abdominal pain.  ?All other systems reviewed and are negative. ? ?Physical Exam ?Updated Vital Signs ?BP (!) 147/71   Pulse 63   Temp 97.6 ?F (36.4 ?C)   Resp 12   LMP  (LMP Unknown)   SpO2 95%  ?Physical Exam ?Vitals and nursing note reviewed.  ?Constitutional:   ?   General: She is in acute distress.  ?   Appearance: She is well-developed. She is ill-appearing.  ?HENT:  ?   Head: Normocephalic and atraumatic.  ?Cardiovascular:  ?   Rate and Rhythm: Normal rate and regular rhythm.  ?   Heart sounds: No murmur heard. ?Pulmonary:  ?   Effort: Pulmonary effort is normal. No respiratory distress.  ?   Breath sounds: Normal breath sounds.  ?Abdominal:  ?   Palpations: Abdomen is soft.  ?   Tenderness: There is no guarding or  rebound.  ?   Comments: Moderate LLQ tenderness  ?Musculoskeletal:     ?   General: No swelling or tenderness.  ?   Comments: 2+ DP pulses bilaterally.   ?Skin: ?   General: Skin is warm and dry.  ?   Coloration: Skin is pale.  ?Neurological:  ?   Mental Status: She is alert and oriented to person, place, and time.  ?Psychiatric:     ?   Behavior: Behavior normal.  ? ? ?ED Results / Procedures / Treatments   ?Labs ?(all labs ordered are listed, but only abnormal results are displayed) ?Labs Reviewed  ?CBC WITH DIFFERENTIAL/PLATELET - Abnormal; Notable for the following components:  ?    Result Value  ? WBC 3.7 (*)   ? Neutro Abs 1.0 (*)   ? All other components within normal limits  ?COMPREHENSIVE  METABOLIC PANEL - Abnormal; Notable for the following components:  ? Sodium 133 (*)   ? Potassium 3.3 (*)   ? Glucose, Bld 106 (*)   ? Creatinine, Ser 1.06 (*)   ? Total Protein 6.4 (*)   ? GFR, Estimated 51 (*)   ? All other components within normal limits  ?URINALYSIS, ROUTINE W REFLEX MICROSCOPIC - Abnormal; Notable for the following components:  ? Color, Urine STRAW (*)   ? All other components within normal limits  ?CBG MONITORING, ED - Abnormal; Notable for the following components:  ? Glucose-Capillary 105 (*)   ? All other components within normal limits  ?LIPASE, BLOOD  ?TROPONIN I (HIGH SENSITIVITY)  ? ? ?EKG ?EKG Interpretation ? ?Date/Time:  Saturday Dec 19 2021 03:16:07 EDT ?Ventricular Rate:  73 ?PR Interval:  184 ?QRS Duration: 98 ?QT Interval:  442 ?QTC Calculation: 486 ?R Axis:   -27 ?Text Interpretation: Normal sinus rhythm Nonspecific T wave abnormality Abnormal ECG Confirmed by Quintella Reichert [phone]) on 12/19/2021 5:42:34 AM ? ?Radiology ?CT ABDOMEN PELVIS W CONTRAST ? ?Result Date: 12/19/2021 ?CLINICAL DATA:  Left lower quadrant abdominal pain EXAM: CT ABDOMEN AND PELVIS WITH CONTRAST TECHNIQUE: Multidetector CT imaging of the abdomen and pelvis was performed using the standard protocol following bolus administration of intravenous contrast. RADIATION DOSE REDUCTION: This exam was performed according to the departmental dose-optimization program which includes automated exposure control, adjustment of the mA and/or kV according to patient size and/or use of iterative reconstruction technique. CONTRAST:  59m OMNIPAQUE IOHEXOL 350 MG/ML SOLN COMPARISON:  09/26/2021 FINDINGS: Lower chest: Incidental note made of right lung base arteriovenous malformation, image 15/3. No acute abnormality identified. Hepatobiliary: Mild diffuse low attenuation within the liver suggest hepatic steatosis. No suspicious liver lesion identified. Gallbladder unremarkable. No bile duct dilatation. Pancreas: Unremarkable.  No pancreatic ductal dilatation or surrounding inflammatory changes. Spleen: Normal in size without focal abnormality. Adrenals/Urinary Tract: Adrenal glands are unremarkable. Kidneys are normal, without renal calculi, focal lesion, or hydronephrosis. Bladder is unremarkable. Stomach/Bowel: Small hiatal hernia. The appendix is not confidently identified. No pericecal inflammation identified. No small bowel wall thickening, inflammation or distension. Sigmoid diverticulosis identified. No significant wall thickening, inflammation or distension to suggest acute diverticulitis. Vascular/Lymphatic: Aortic atherosclerosis. No aneurysm. No signs of abdominopelvic adenopathy. Reproductive: Uterus and adnexal structures are unremarkable. Other: No free fluid or fluid collections identified. No abdominal wall hernia identified. Musculoskeletal: Diffuse osteopenia. Marked degenerative disc disease identified at L4-5 and L5-S1. Mild degenerative disc disease identified L3-4. Bilateral lower lumbar spine facet arthropathy. No acute osseous findings. IMPRESSION: 1. No acute findings within the abdomen or pelvis. 2. Sigmoid  diverticulosis without evidence for acute diverticulitis. 3. Hepatic steatosis. 4. Aortic Atherosclerosis (ICD10-I70.0). Electronically Signed   By: Kerby Moors M.D.   On: 12/19/2021 06:03   ? ?Procedures ?Procedures  ? ? ?Medications Ordered in ED ?Medications  ?HYDROcodone-acetaminophen (NORCO/VICODIN) 5-325 MG per tablet 2 tablet (2 tablets Oral Given 12/19/21 0339)  ?naloxone [facility]) injection 0.4 mg (0.4 mg Intravenous Given 12/19/21 0456)  ?iohexol (OMNIPAQUE) 350 MG/ML injection 80 mL (80 mLs Intravenous Contrast Given 12/19/21 0541)  ? ? ?ED Course/ Medical Decision Making/ A&P ?  ?                        ?Medical Decision Making ? ?Patient with history of coronary artery disease, pancreatitis, hypertension, hyperlipidemia here for evaluation of 3 days of left lower quadrant abdominal pain.  She was  treated with hydrocodone for her pain on ED arrival.  She did develop respiratory depression, diaphoresis after this medication and did require a dose of Narcan.  At time of my assessment she had received Narcan.  She

## 2021-12-19 NOTE — ED Triage Notes (Signed)
Pt brought to ED by [facility] with c/o LLQ abdominal pain x3 days.  ?

## 2021-12-19 NOTE — ED Notes (Signed)
Patient to CT.

## 2021-12-19 NOTE — ED Notes (Signed)
PA Browning at bedside. 

## 2021-12-19 NOTE — ED Provider Notes (Signed)
?  Physical Exam  ?BP (!) 156/76 (BP Location: Left Arm)   Pulse 62   Temp 97.7 ?F (36.5 ?C) (Oral)   Resp 14   LMP  (LMP Unknown)   SpO2 96%  ? ?Physical Exam ? ?Procedures  ?Procedures ? ?ED Course / MDM  ?  ? ?Received care of patient from previous providers.  Please see note for prior history, physical and care.  Briefly this is a 87 year old female who presents with concern for left lower and left upper quadrant abdominal pain that radiates around to the back.  The pain is severe, dull "like an ache ."  Its not worse with eating or moving, and has just been constant for the last 3 days. ? ?Reviewed prior medical records, which show prior visits for left upper quadrant abdominal pain and admission for chest pain in February, after which she was started on Prilosec.   ? ?Personally reviewed and interpreted CT imaging, and evaluated radiology read showing no acute abnormality in the abdomen or pelvis, no aneurysm, no obstructive nephrolithiasis.  Have low suspicion for aortic dissection in the setting of normal bilateral upper and lower extremity pulses, 3 days of symptoms, similar episode of pain in the past.  Troponin was negative after 3 days of symptoms and have low suspicion for ACS.  Urinalysis shows no signs of urinary tract infection.  Do not see emergent indication for hospitalization or surgery.  DDx for symptoms continues to include degenerative disc disease in the back with radiation, possible gastritis or peptic ulcer disease, or possible shingles without a rash.  Recommend continued follow-up with primary care physician.  Given prescription for pantoprazole, and Bentyl.  Pt able to ambulate and eat prior to discharge. Patient discharged in stable condition with understanding of reasons to return.  ? ? ? ? ? ? ?  ?Gareth Morgan, MD ?12/19/21 1101 ? ?

## 2022-02-19 ENCOUNTER — Encounter: Payer: Self-pay | Admitting: Obstetrics & Gynecology

## 2022-02-21 ENCOUNTER — Observation Stay (HOSPITAL_BASED_OUTPATIENT_CLINIC_OR_DEPARTMENT_OTHER)
Admission: EM | Admit: 2022-02-21 | Discharge: 2022-02-23 | Disposition: A | Discharge status: Home / Self Care | Payer: Medicare PPO | Attending: Student | Admitting: Student

## 2022-02-21 ENCOUNTER — Encounter (HOSPITAL_BASED_OUTPATIENT_CLINIC_OR_DEPARTMENT_OTHER): Payer: Self-pay | Admitting: Emergency Medicine

## 2022-02-21 ENCOUNTER — Other Ambulatory Visit: Payer: Self-pay

## 2022-02-21 ENCOUNTER — Emergency Department (HOSPITAL_BASED_OUTPATIENT_CLINIC_OR_DEPARTMENT_OTHER): Payer: Medicare PPO

## 2022-02-21 DIAGNOSIS — R2681 Unsteadiness on feet: Secondary | ICD-10-CM | POA: Insufficient documentation

## 2022-02-21 DIAGNOSIS — R531 Weakness: Secondary | ICD-10-CM | POA: Diagnosis present

## 2022-02-21 DIAGNOSIS — I10 Essential (primary) hypertension: Secondary | ICD-10-CM | POA: Diagnosis not present

## 2022-02-21 DIAGNOSIS — Z85858 Personal history of malignant neoplasm of other endocrine glands: Secondary | ICD-10-CM | POA: Diagnosis not present

## 2022-02-21 DIAGNOSIS — M6281 Muscle weakness (generalized): Secondary | ICD-10-CM | POA: Diagnosis not present

## 2022-02-21 DIAGNOSIS — Z79899 Other long term (current) drug therapy: Secondary | ICD-10-CM | POA: Insufficient documentation

## 2022-02-21 DIAGNOSIS — Z7189 Other specified counseling: Secondary | ICD-10-CM | POA: Insufficient documentation

## 2022-02-21 DIAGNOSIS — R269 Unspecified abnormalities of gait and mobility: Secondary | ICD-10-CM | POA: Diagnosis not present

## 2022-02-21 DIAGNOSIS — I2510 Atherosclerotic heart disease of native coronary artery without angina pectoris: Secondary | ICD-10-CM | POA: Diagnosis not present

## 2022-02-21 DIAGNOSIS — R519 Headache, unspecified: Secondary | ICD-10-CM | POA: Diagnosis not present

## 2022-02-21 DIAGNOSIS — S40262A Insect bite (nonvenomous) of left shoulder, initial encounter: Secondary | ICD-10-CM | POA: Insufficient documentation

## 2022-02-21 DIAGNOSIS — Z7982 Long term (current) use of aspirin: Secondary | ICD-10-CM | POA: Insufficient documentation

## 2022-02-21 DIAGNOSIS — E871 Hypo-osmolality and hyponatremia: Secondary | ICD-10-CM | POA: Diagnosis not present

## 2022-02-21 DIAGNOSIS — Z85828 Personal history of other malignant neoplasm of skin: Secondary | ICD-10-CM | POA: Insufficient documentation

## 2022-02-21 DIAGNOSIS — Z87891 Personal history of nicotine dependence: Secondary | ICD-10-CM | POA: Insufficient documentation

## 2022-02-21 DIAGNOSIS — W57XXXA Bitten or stung by nonvenomous insect and other nonvenomous arthropods, initial encounter: Secondary | ICD-10-CM | POA: Insufficient documentation

## 2022-02-21 DIAGNOSIS — Z85841 Personal history of malignant neoplasm of brain: Secondary | ICD-10-CM | POA: Diagnosis not present

## 2022-02-21 DIAGNOSIS — Y92007 Garden or yard of unspecified non-institutional (private) residence as the place of occurrence of the external cause: Secondary | ICD-10-CM | POA: Insufficient documentation

## 2022-02-21 LAB — CBC WITH DIFFERENTIAL/PLATELET
Abs Immature Granulocytes: 0 10*3/uL (ref 0.00–0.07)
Basophils Absolute: 0 10*3/uL (ref 0.0–0.1)
Basophils Relative: 1 %
Eosinophils Absolute: 0.2 10*3/uL (ref 0.0–0.5)
Eosinophils Relative: 7 %
HCT: 38.1 % (ref 36.0–46.0)
Hemoglobin: 13.5 g/dL (ref 12.0–15.0)
Immature Granulocytes: 0 %
Lymphocytes Relative: 60 %
Lymphs Abs: 1.8 10*3/uL (ref 0.7–4.0)
MCH: 30.1 pg (ref 26.0–34.0)
MCHC: 35.4 g/dL (ref 30.0–36.0)
MCV: 85 fL (ref 80.0–100.0)
Monocytes Absolute: 0.4 10*3/uL (ref 0.1–1.0)
Monocytes Relative: 13 %
Neutro Abs: 0.6 10*3/uL — ABNORMAL LOW (ref 1.7–7.7)
Neutrophils Relative %: 19 %
Platelets: 313 10*3/uL (ref 150–400)
RBC: 4.48 MIL/uL (ref 3.87–5.11)
RDW: 13 % (ref 11.5–15.5)
Smear Review: NORMAL
WBC Morphology: ABNORMAL
WBC: 3 10*3/uL — ABNORMAL LOW (ref 4.0–10.5)
nRBC: 0 % (ref 0.0–0.2)

## 2022-02-21 LAB — COMPREHENSIVE METABOLIC PANEL
ALT: 12 U/L (ref 0–44)
AST: 31 U/L (ref 15–41)
Albumin: 3.3 g/dL — ABNORMAL LOW (ref 3.5–5.0)
Alkaline Phosphatase: 44 U/L (ref 38–126)
Anion gap: 5 (ref 5–15)
BUN: 8 mg/dL (ref 8–23)
CO2: 25 mmol/L (ref 22–32)
Calcium: 8.5 mg/dL — ABNORMAL LOW (ref 8.9–10.3)
Chloride: 95 mmol/L — ABNORMAL LOW (ref 98–111)
Creatinine, Ser: 0.86 mg/dL (ref 0.44–1.00)
GFR, Estimated: >60 mL/min (ref >60)
Glucose, Bld: 91 mg/dL (ref 70–99)
Potassium: 3.8 mmol/L (ref 3.5–5.1)
Sodium: 125 mmol/L — ABNORMAL LOW (ref 135–145)
Total Bilirubin: 0.6 mg/dL (ref 0.3–1.2)
Total Protein: 5.9 g/dL — ABNORMAL LOW (ref 6.5–8.1)

## 2022-02-21 LAB — URINALYSIS, ROUTINE W REFLEX MICROSCOPIC
Bilirubin Urine: NEGATIVE
Glucose, UA: NEGATIVE mg/dL
Hgb urine dipstick: NEGATIVE
Ketones, ur: NEGATIVE mg/dL
Leukocytes,Ua: NEGATIVE
Nitrite: NEGATIVE
Protein, ur: NEGATIVE mg/dL
Specific Gravity, Urine: 1.02 (ref 1.005–1.030)
pH: 6 (ref 5.0–8.0)

## 2022-02-21 LAB — LACTIC ACID, PLASMA: Lactic Acid, Venous: 1 mmol/L (ref 0.5–1.9)

## 2022-02-21 LAB — LIPASE, BLOOD: Lipase: 49 U/L (ref 11–51)

## 2022-02-21 MED ORDER — SODIUM CHLORIDE 0.9 % IV BOLUS
1000.0000 mL | Freq: Once | INTRAVENOUS | Status: AC
  Administered 2022-02-21: 1000 mL via INTRAVENOUS

## 2022-02-21 MED ORDER — ALBUTEROL SULFATE (2.5 MG/3ML) 0.083% IN NEBU
2.5000 mg | INHALATION_SOLUTION | Freq: Four times a day (QID) | RESPIRATORY_TRACT | Status: DC
  Filled 2022-02-21: qty 3

## 2022-02-21 MED ORDER — HYDRALAZINE HCL 20 MG/ML IJ SOLN: 10.0000 mg | Freq: Four times a day (QID) | INTRAMUSCULAR | Status: DC | PRN

## 2022-02-21 MED ORDER — ACETAMINOPHEN 325 MG PO TABS: 650.0000 mg | ORAL_TABLET | Freq: Four times a day (QID) | ORAL | Status: DC | PRN

## 2022-02-21 MED ORDER — ENOXAPARIN SODIUM 40 MG/0.4ML IJ SOSY
40.0000 mg | PREFILLED_SYRINGE | INTRAMUSCULAR | Status: DC
  Administered 2022-02-22: 40 mg via SUBCUTANEOUS
  Filled 2022-02-21: qty 0.4

## 2022-02-21 MED ORDER — ACETAMINOPHEN 325 MG PO TABS
650.0000 mg | ORAL_TABLET | Freq: Once | ORAL | Status: AC
  Administered 2022-02-21: 650 mg via ORAL
  Filled 2022-02-21: qty 2

## 2022-02-21 MED ORDER — POLYETHYLENE GLYCOL 3350 17 G PO PACK
17.0000 g | PACK | Freq: Every day | ORAL | Status: DC | PRN
Start: 2022-02-21 — End: 2022-02-23

## 2022-02-21 MED ORDER — ALBUTEROL SULFATE (2.5 MG/3ML) 0.083% IN NEBU: 2.5000 mg | INHALATION_SOLUTION | Freq: Four times a day (QID) | RESPIRATORY_TRACT | Status: DC | PRN

## 2022-02-21 MED ORDER — DOXYCYCLINE HYCLATE 100 MG PO TABS
100.0000 mg | ORAL_TABLET | Freq: Once | ORAL | Status: DC
  Filled 2022-02-21: qty 1

## 2022-02-21 MED ORDER — SENNA 8.6 MG PO TABS
1.0000 | ORAL_TABLET | Freq: Two times a day (BID) | ORAL | Status: DC
  Administered 2022-02-22 – 2022-02-23 (×3): 8.6 mg via ORAL
  Filled 2022-02-21 (×4): qty 1

## 2022-02-21 MED ORDER — ACETAMINOPHEN 650 MG RE SUPP: 650.0000 mg | Freq: Four times a day (QID) | RECTAL | Status: DC | PRN

## 2022-02-21 MED ORDER — ONDANSETRON HCL 4 MG/2ML IJ SOLN
4.0000 mg | Freq: Four times a day (QID) | INTRAMUSCULAR | Status: DC | PRN
  Administered 2022-02-21 – 2022-02-22 (×2): 4 mg via INTRAVENOUS
  Filled 2022-02-21 (×2): qty 2

## 2022-02-21 MED ORDER — SODIUM CHLORIDE 0.9 % IV SOLN
INTRAVENOUS | Status: DC
Start: 2022-02-21 — End: 2022-02-23

## 2022-02-21 MED ORDER — HYDROCODONE-ACETAMINOPHEN 5-325 MG PO TABS
1.0000 | ORAL_TABLET | ORAL | Status: DC | PRN
  Administered 2022-02-21 – 2022-02-22 (×2): 2 via ORAL
  Filled 2022-02-21 (×2): qty 2

## 2022-02-21 NOTE — ED Notes (Signed)
Bilaterally equal

## 2022-02-21 NOTE — Progress Notes (Signed)
Pt arrived from [facility] to room 1512.

## 2022-02-21 NOTE — Progress Notes (Signed)
Plan of Care Note for accepted transfer   Patient: Adriana Jordan MRN: [id number]   Adriana Jordan: 02/21/2020  Facility requesting transfer: [facility].  Requesting Provider: Myna Bright, PA-C and Aletta Edouard, MD Reason for transfer: Hyponatremia. Facility course: The patient received 1000 mL of normal saline bolus.  Per ED provider: " Chief Complaint  Patient presents with   Weakness     Adriana Jordan is a 87 y.o. female patient who presents to the emergency department today for further evaluation of general weakness with associated nausea and vomiting. This has been ongoing for 4 days. Patient has been having decreased PO intake with fluids and foods.  She denies abdominal pain, urinary complaints, diarrhea, shortness of breath, chest pain.  Patient does state that she has been having cough and general malaise.  Of note, patient was bit by a tick approximately 3 days ago in the left shoulder."  Urinalysis, Routine w reflex microscopic Urine, Clean Catch [[id number]]   Collected: 02/21/22 1251   Updated: 02/21/22 1259   Specimen Source: Urine, Clean Catch    Color, Urine YELLOW   APPearance CLEAR   Specific Gravity, Urine 1.020   pH 6.0   Glucose, UA NEGATIVE mg/dL   Hgb urine dipstick NEGATIVE   Bilirubin Urine NEGATIVE   Ketones, ur NEGATIVE mg/dL   Protein, ur NEGATIVE mg/dL   Nitrite NEGATIVE   Leukocytes,Ua NEGATIVE  Lactic acid, plasma [[id number]]   Collected: 02/21/22 1119   Updated: 02/21/22 1204   Specimen Type: Blood    Lactic Acid, Venous 1.0 mmol/L  Culture, blood (routine x 2) [[id number]]   Collected: 02/21/22 1138   Updated: 02/21/22 1142   Specimen Type: Blood   Specimen Source: Peripheral   Rocky mtn spotted fvr abs pnl(IgG+IgM) [[id number]]   Collected: 02/21/22 1119   Updated: 02/21/22 1142   Specimen Type: Blood   Lyme Disease Serology w/Reflex [[id number]]   Collected: 02/21/22 1119   Updated: 02/21/22 1141   Culture, blood (routine x 2)  [[id number]]   Collected: 02/21/22 0920   Updated: 02/21/22 1114   Specimen Type: Blood   Specimen Source: Peripheral   Lipase, blood [[id number]]   Collected: 02/21/22 1010   Updated: 02/21/22 1041    Lipase 49 U/L  Comprehensive metabolic panel [[id number]] (Abnormal)   Collected: 02/21/22 1010   Updated: 02/21/22 1041    Sodium 125 Low  mmol/L   Potassium 3.8 mmol/L   Chloride 95 Low  mmol/L   CO2 25 mmol/L   Glucose, Bld 91 mg/dL   BUN 8 mg/dL   Creatinine, Ser 0.86 mg/dL   Calcium 8.5 Low  mg/dL   Total Protein 5.9 Low  g/dL   Albumin 3.3 Low  g/dL   AST 31 U/L   ALT 12 U/L   Alkaline Phosphatase 44 U/L   Total Bilirubin 0.6 mg/dL   GFR, Estimated >60 mL/min   Anion gap 5  CBC with Differential [[id number]] (Abnormal)   Collected: 02/21/22 0920   Updated: 02/21/22 1024   Specimen Type: Blood   Specimen Source: Vein    WBC 3.0 Low  K/uL   RBC 4.48 MIL/uL   Hemoglobin 13.5 g/dL   HCT 38.1 %   MCV 85.0 fL   MCH 30.1 pg   MCHC 35.4 g/dL   RDW 13.0 %   Platelets 313 K/uL   nRBC 0.0 %   Neutrophils Relative % 19 %   Neutro Abs 0.6 Low  K/uL   Lymphocytes Relative 60 %  Lymphs Abs 1.8 K/uL   Monocytes Relative 13 %   Monocytes Absolute 0.4 K/uL   Eosinophils Relative 7 %   Eosinophils Absolute 0.2 K/uL   Basophils Relative 1 %   Basophils Absolute 0.0 K/uL   WBC Morphology Abnormal lymphocytes present   RBC Morphology MORPHOLOGY UNREMARKABLE   Smear Review Normal platelet morphology   Immature Granulocytes 0 %   Abs Immature Granulocytes 0.00 K/uL   Imaging: EXAM: PORTABLE CHEST 1 VIEW COMPARISON:  Chest x-ray 09/26/2021.   FINDINGS: Lung volumes are normal. No consolidative airspace disease. No pleural effusions. No pneumothorax. No pulmonary nodule or mass noted. Pulmonary vasculature and the cardiomediastinal silhouette are within normal limits. Atherosclerosis in the thoracic aorta. Orthopedic fixation hardware in the lower cervical  spine incidentally noted.   IMPRESSION: 1. No radiographic evidence of acute cardiopulmonary disease. 2. Aortic atherosclerosis.   Electronically Signed   By: Vinnie Langton M.D.   On: 02/21/2022 10:19  Plan of care: The patient is accepted for admission to Telemetry unit, at [facility]   Author: Reubin Milan, MD 02/21/2022  Check [email or web address] for on-call coverage.  Nursing staff, Please call [place] number on Amion as soon as patient's arrival, so appropriate admitting provider can evaluate the pt.

## 2022-02-21 NOTE — ED Triage Notes (Signed)
Pt arrives pov with son, to triage in wheelchair, c/o weakness, and emesis with HA x 5 days. Pt also reports tick bite x 1 week pta. Red circular area to left shoulder.

## 2022-02-21 NOTE — ED Provider Notes (Signed)
[place] EMERGENCY DEPARTMENT Provider Note   CSN: [id number] Arrival date & time: 02/21/22  5956     History Chief Complaint  Patient presents with  . Weakness    IVANNA Jordan is a 87 y.o. female patient who presents to the emergency department today for further evaluation of general weakness with associated nausea and vomiting. This has been ongoing for 4 days. Patient has been having decreased PO intake with fluids and foods.  She denies abdominal pain, urinary complaints, diarrhea, shortness of breath, chest pain.  Patient does state that she has been having cough and general malaise.  Of note, patient was bit by a tick approximately 3 days ago in the left shoulder.   Weakness      Home Medications Prior to Admission medications   Medication Sig Start Date End Date Taking? Authorizing Provider  Apoaequorin (PREVAGEN PO) Take 1 tablet by mouth every morning.    [provider]  aspirin 325 MG tablet Take 325 mg by mouth daily.    [provider]  Biotin w/ Vitamins C & E (HAIR SKIN & NAILS GUMMIES PO) Take 1 tablet by mouth daily.    [provider]  cetirizine (ZYRTEC) 10 MG tablet Take 10 mg by mouth daily as needed for allergies.    [provider]  dicyclomine (BENTYL) 20 MG tablet Take 1 tablet (20 mg total) by mouth 2 (two) times daily. 12/19/21   Gareth Morgan, MD  HYDROcodone-acetaminophen (NORCO/VICODIN) 5-325 MG tablet Take 1 tablet by mouth daily as needed (pain). 03/05/20   [provider]  ibuprofen (ADVIL) 200 MG tablet Take 400 mg by mouth daily as needed for headache.    [provider]  losartan (COZAAR) 50 MG tablet Take 50 mg by mouth every morning.    [provider]  metoprolol succinate (TOPROL-XL) 25 MG 24 hr tablet Take 25 mg by mouth daily. 09/17/21   [provider]  mirtazapine (REMERON) 15 MG tablet Take 15 mg by mouth at bedtime. 09/10/21   [provider]   nitroGLYCERIN (NITROSTAT) 0.4 MG SL tablet Place 1 tablet (0.4 mg total) under the tongue every 5 (five) minutes x 3 doses as needed for chest pain. 09/13/21   Dixie Dials, MD  pantoprazole (PROTONIX) 20 MG tablet Take 2 tablets (40 mg total) by mouth daily for 14 days. 12/19/21 01/02/22  Gareth Morgan, MD  potassium chloride (KLOR-CON M) 10 MEQ tablet Take 1 tablet (10 mEq total) by mouth daily. Patient not taking: Reported on 12/19/2021 09/14/21   Dixie Dials, MD  rosuvastatin (CRESTOR) 20 MG tablet Take 20 mg by mouth at bedtime. 07/17/21   [provider]      Allergies    Codeine    Review of Systems   Review of Systems  Neurological:  Positive for weakness.  All other systems reviewed and are negative.   Physical Exam Updated Vital Signs BP (!) 146/72   Pulse 62   Temp 97.7 F (36.5 C)   Resp 17   Wt 60.9 kg   LMP  (LMP Unknown)   SpO2 92%   BMI 21.67 kg/m  Physical Exam Vitals and nursing note reviewed.  Constitutional:      General: She is not in acute distress.    Appearance: Normal appearance.  HENT:     Head: Normocephalic and atraumatic.     Mouth/Throat:     Mouth: Mucous membranes are dry.  Eyes:  General:        Right eye: No discharge.        Left eye: No discharge.  Cardiovascular:     Comments: Regular rate and rhythm.  S1/S2 are distinct without any evidence of murmur, rubs, or gallops.  Radial pulses are 2+ bilaterally.  Dorsalis pedis pulses are 2+ bilaterally.  No evidence of pedal edema. Pulmonary:     Comments: Clear to auscultation bilaterally.  Normal effort.  No respiratory distress.  No evidence of wheezes, rales, or rhonchi heard throughout. Abdominal:     General: Abdomen is flat. Bowel sounds are normal. There is no distension.     Tenderness: There is no abdominal tenderness. There is no guarding or rebound.  Musculoskeletal:        General: Normal range of motion.     Cervical back: Neck supple.  Skin:    General: Skin  is warm and dry.     Findings: No rash.     Comments: Erythematous raised lesion on the left shoulder.  No evidence of erythema migrans.  No evidence of tick head.  Neurological:     General: No focal deficit present.     Mental Status: She is alert.  Psychiatric:        Mood and Affect: Mood normal.        Behavior: Behavior normal.     ED Results / Procedures / Treatments   Labs (all labs ordered are listed, but only abnormal results are displayed) Labs Reviewed  CBC WITH DIFFERENTIAL/PLATELET - Abnormal; Notable for the following components:      Result Value   WBC 3.0 (*)    Neutro Abs 0.6 (*)    All other components within normal limits  COMPREHENSIVE METABOLIC PANEL - Abnormal; Notable for the following components:   Sodium 125 (*)    Chloride 95 (*)    Calcium 8.5 (*)    Total Protein 5.9 (*)    Albumin 3.3 (*)    All other components within normal limits  CULTURE, BLOOD (ROUTINE X 2)  CULTURE, BLOOD (ROUTINE X 2)  LIPASE, BLOOD  LACTIC ACID, PLASMA  LACTIC ACID, PLASMA  ROCKY MTN SPOTTED FVR ABS PNL(IGG+IGM)  LYME DISEASE SEROLOGY W/REFLEX    EKG None  Radiology DG Chest Port 1 View  Result Date: 02/21/2022 CLINICAL DATA:  87 year old female with history of weakness and emesis with headache for the past 5 days. EXAM: PORTABLE CHEST 1 VIEW COMPARISON:  Chest x-ray 09/26/2021. FINDINGS: Lung volumes are normal. No consolidative airspace disease. No pleural effusions. No pneumothorax. No pulmonary nodule or mass noted. Pulmonary vasculature and the cardiomediastinal silhouette are within normal limits. Atherosclerosis in the thoracic aorta. Orthopedic fixation hardware in the lower cervical spine incidentally noted. IMPRESSION: 1. No radiographic evidence of acute cardiopulmonary disease. 2. Aortic atherosclerosis. Electronically Signed   By: Vinnie Langton M.D.   On: 02/21/2022 10:19    Procedures Procedures    Medications Ordered in ED Medications  sodium  chloride 0.9 % bolus 1,000 mL ( Intravenous Stopped 02/21/22 1044)    ED Course/ Medical Decision Making/ A&P Clinical Course as of 02/21/22 1257  Sun Feb 21, 2022  1022 She is here for evaluation of nausea vomiting headache feeling weak.  No chest pain or abdominal pain.  Did have a recent tick bite.  Getting labs and imaging, IV fluids.  Disposition per results of testing [MB]  0923 CBC with Differential(!) There is evidence of leukopenia. [CF]  3007  Comprehensive metabolic panel(!) Very significant hyponatremia.  Hypochloremia.  Rest of her CMP is normal. [CF]  1143 Lipase, blood Normal. [CF]  1143 On reevaluation, patient states she is feeling slightly better after fluids but not much. [CF]  1103 DG Chest [facility] I personally ordered and interpreted a chest x-ray which does not reveal any signs of pneumonia.  I do agree with the radiologist interpretation. [CF]  1234 I spoke with Dr. Olevia Bowens with [facility] who agrees to admit the patient.  [CF]  1257 Lactic acid, plasma Lactic is normal. [CF]    Clinical Course User Index [CF] Hendricks Limes, PA-C [MB] Hayden Rasmussen, MD                           Medical Decision Making ARVIS MIGUEZ is a 87 y.o. female patient who presents to the emergency department today for further evaluation of general weakness, nausea, and vomiting.  Clinically, patient appears well with normal vital signs.  She is resting comfortably in the emergency department.  Mucous membranes are dry likely secondary to the vomiting.  I do see where she was bit by a tick but did not see any evidence of erythema migrans.  We will get basic labs and give her some fluids and plan to reassess.  Lactic acid and labs are unremarkable apart from the leukopenia and hyponatremia.  All these are highlighted in ED course.  Given the clinical scenario, I do feel the patient would benefit from further evaluation in the hospital for further management of her  hyponatremia and generalized weakness.  I sent off tickborne illness titers which are in process.  Cultures were also obtained.  I will work on getting her admitted to the hospitalist service.  She is in no acute distress at this time although still is generally weak after fluids.  Amount and/or Complexity of Data Reviewed Labs: ordered. Decision-making details documented in ED Course. Radiology: ordered. Decision-making details documented in ED Course.  Risk Decision regarding hospitalization.   Final Clinical Impression(s) / ED Diagnoses Final diagnoses:  Hyponatremia  Generalized weakness  Tick bite of left shoulder, initial encounter    Rx / DC Orders ED Discharge Orders     None         Cherrie Gauze 02/21/22 1238    Hayden Rasmussen, MD 02/21/22 [phone]

## 2022-02-21 NOTE — H&P (Signed)
[facility] History and Physical  Adriana Jordan ZHG:[id number] DOB: [birth year] DOA: 02/21/2022 PCP: Dixie Dials, MD  Admitted from: Home Chief Complaint: Headache  History of Present Illness: Adriana Jordan is a 87 y.o. female with PMH significant for HTN, HLD, CAD, history of pituitary tumor status post radiation, GERD, arthritis, chronic low back pain. Patient was brought to ED at [facility] by her son today with complaint of weakness, vomiting and headache for 5 days.  She apparently had a tick bite about 5 days ago in her garden.  She states she picked out the tick and told her son later.  Subsequently, she started developing a red circular area on the left shoulder. At baseline, she lives at home and was walking 1 mile a day until a week ago.  She has intermittent vomiting and her appetite is always poor.  However since the tick bite, she has very poor appetite, persistent headache, vomited 1 time yesterday morning.  In the ED, patient was afebrile, heart rate in 60s, blood pressure was elevated to 134/75, breathing on room air Labs showed WC count of 3, hemoglobin 13.5, platelet 313, sodium 125, lactic acid 1, urinalysis normal.  Chest x-ray unremarkable At the time of my evaluation, patient was lying on bed.  Looked tired, half awake.  Able to have a conversation and confirmed the history. No family at bedside.  I called her son Adriana Jordan and confirmed the history with him.  Review of Systems:  All systems were reviewed and were negative unless otherwise mentioned in the HPI   Past medical history: Past Medical History:  Diagnosis Date   Arthritis    "all over" (11/14/2017)   Brain tumor ([place])    "I still have it"; not cancer (11/14/2017)   Chronic lower back pain    Coronary artery disease    Dr. Doylene Canard   GERD (gastroesophageal reflux disease)    History of blood transfusion    "don't remember why" (11/14/2017)   History of radiation therapy 07/03/2018    brain, pituitary/ 12.5 Gy in 1 fraction   Hypercholesteremia    Hypertension    MI (myocardial infarction) ([place]) 2006   PONV (postoperative nausea and vomiting)    Skin cancer    "right forearm"    Past surgical history: Past Surgical History:  Procedure Laterality Date   ANTERIOR CERVICAL DECOMP/DISCECTOMY FUSION     BACK SURGERY     CARPAL TUNNEL RELEASE Bilateral    CORONARY ANGIOPLASTY     DES mid LAD and mid RCA 09/21/04   CRANIOTOMY N/A 05/21/2015   Procedure: Transsphenoidal resection of pituitary tumor with Dr. Radene Journey for approach;  Surgeon: Leeroy Cha, MD;  Location: [facility] NEURO ORS;  Service: Neurosurgery;  Laterality: N/A;  Transsphenoidal resection of pituitary tumor with Dr. Radene Journey for approach   SHOULDER OPEN ROTATOR CUFF REPAIR Bilateral    SKIN CANCER EXCISION Right    forearm   thumb surgery Right    placed srews to make straight   THYROID SURGERY     "goiter removed"    Social History:  reports that she quit smoking about 52 years ago. Her smoking use included cigarettes. She has a 0.60 pack-year smoking history. She has never used smokeless tobacco. She reports that she does not currently use alcohol. She reports that she does not use drugs.  Allergies:  Allergies  Allergen Reactions   Codeine Nausea And Vomiting    Tolerates hydrocodone   Codeine  Family history:  Family History  Problem Relation Age of Onset   Cancer Father 80       throat- smoker    Breast cancer Sister 27   Cancer Brother 18       lung- smoker     Home Meds: Prior to Admission medications   Medication Sig Start Date End Date Taking? Authorizing Provider  Apoaequorin (PREVAGEN PO) Take 1 tablet by mouth every morning.    [provider]  aspirin 325 MG tablet Take 325 mg by mouth daily.    [provider]  Biotin w/ Vitamins C & E (HAIR SKIN & NAILS GUMMIES PO) Take 1 tablet by mouth daily.    [provider]  cetirizine (ZYRTEC) 10  MG tablet Take 10 mg by mouth daily as needed for allergies.    [provider]  dicyclomine (BENTYL) 20 MG tablet Take 1 tablet (20 mg total) by mouth 2 (two) times daily. 12/19/21   Gareth Morgan, MD  HYDROcodone-acetaminophen (NORCO/VICODIN) 5-325 MG tablet Take 1 tablet by mouth daily as needed (pain). 03/05/20   [provider]  ibuprofen (ADVIL) 200 MG tablet Take 400 mg by mouth daily as needed for headache.    [provider]  losartan (COZAAR) 50 MG tablet Take 50 mg by mouth every morning.    [provider]  metoprolol succinate (TOPROL-XL) 25 MG 24 hr tablet Take 25 mg by mouth daily. 09/17/21   [provider]  mirtazapine (REMERON) 15 MG tablet Take 15 mg by mouth at bedtime. 09/10/21   [provider]  nitroGLYCERIN (NITROSTAT) 0.4 MG SL tablet Place 1 tablet (0.4 mg total) under the tongue every 5 (five) minutes x 3 doses as needed for chest pain. 09/13/21   Dixie Dials, MD  pantoprazole (PROTONIX) 20 MG tablet Take 2 tablets (40 mg total) by mouth daily for 14 days. 12/19/21 02/21/22  Gareth Morgan, MD  potassium chloride (KLOR-CON M) 10 MEQ tablet Take 1 tablet (10 mEq total) by mouth daily. 09/14/21   Dixie Dials, MD  rosuvastatin (CRESTOR) 20 MG tablet Take 20 mg by mouth at bedtime. 07/17/21   [provider]    Physical Exam: Vitals:   02/21/22 1200 02/21/22 1230 02/21/22 1300 02/21/22 1525  BP: 137/75 (!) 146/72 139/75 (!) 166/88  Pulse: 66 62 67 65  Resp: '10 17 16 18  '$ Temp:    97.7 F (36.5 C)  SpO2: 94% 92% 95% 97%  Weight:    60.4 kg  Height:    '5\' 6"'$  (1.676 m)   Wt Readings from Last 3 Encounters:  02/21/22 60.4 kg  09/26/21 62.1 kg  09/13/21 60.9 kg   Body mass index is 21.49 kg/m.  General exam: Pleasant, elderly Caucasian female.  In mild distress because of headache HEENT: Atraumatic, normocephalic, no obvious bleeding Lungs: Clear to auscultation bilaterally CVS: Regular rate and rhythm, no  murmur GI/Abd soft, nontender, nondistended, bowel sound present CNS: Alert, awake, oriented x3 Psychiatry: Sad affect Extremities: No pedal edema, no calf tenderness     Consult Orders  (From admission, onward)           Start     Ordered   02/21/22 1724  PT eval and treat  Routine        02/21/22 1725   02/21/22 1218  Consult to hospitalist  [place] for consult talked to [place] at 12:20  Once       Provider:  (Not yet  assigned)  Question Answer Comment  Place call to: [facility]   Reason for Consult Admit      02/21/22 1217            Labs on Admission:   CBC: Recent Labs  Lab 02/21/22 0920  WBC 3.0*  NEUTROABS 0.6*  HGB 13.5  HCT 38.1  MCV 85.0  PLT 272    Basic Metabolic Panel: Recent Labs  Lab 02/21/22 1010  NA 125*  K 3.8  CL 95*  CO2 25  GLUCOSE 91  BUN 8  CREATININE 0.86  CALCIUM 8.5*    Liver Function Tests: Recent Labs  Lab 02/21/22 1010  AST 31  ALT 12  ALKPHOS 44  BILITOT 0.6  PROT 5.9*  ALBUMIN 3.3*   Recent Labs  Lab 02/21/22 1010  LIPASE 49   No results for input(s): "AMMONIA" in the last 168 hours.  Cardiac Enzymes: No results for input(s): "CKTOTAL", "CKMB", "CKMBINDEX", "TROPONINI" in the last 168 hours.  BNP (last 3 results) No results for input(s): "BNP" in the last 8760 hours.  ProBNP (last 3 results) No results for input(s): "PROBNP" in the last 8760 hours.  CBG: No results for input(s): "GLUCAP" in the last 168 hours.  Lipase     Component Value Date/Time   LIPASE 49 02/21/2022 1010     Urinalysis    Component Value Date/Time   COLORURINE YELLOW 02/21/2022 1251   APPEARANCEUR CLEAR 02/21/2022 1251   LABSPEC 1.020 02/21/2022 1251   PHURINE 6.0 02/21/2022 1251   GLUCOSEU NEGATIVE 02/21/2022 1251   HGBUR NEGATIVE 02/21/2022 1251   BILIRUBINUR NEGATIVE 02/21/2022 1251   KETONESUR NEGATIVE 02/21/2022 1251   PROTEINUR NEGATIVE 02/21/2022 1251   UROBILINOGEN 0.2 03/01/2015 2045    NITRITE NEGATIVE 02/21/2022 1251   LEUKOCYTESUR NEGATIVE 02/21/2022 1251     Drugs of Abuse  No results found for: "LABOPIA", "COCAINSCRNUR", "LABBENZ", "AMPHETMU", "THCU", "LABBARB"    Radiological Exams on Admission: DG Chest Port 1 View  Result Date: 02/21/2022 CLINICAL DATA:  87 year old female with history of weakness and emesis with headache for the past 5 days. EXAM: PORTABLE CHEST 1 VIEW COMPARISON:  Chest x-ray 09/26/2021. FINDINGS: Lung volumes are normal. No consolidative airspace disease. No pleural effusions. No pneumothorax. No pulmonary nodule or mass noted. Pulmonary vasculature and the cardiomediastinal silhouette are within normal limits. Atherosclerosis in the thoracic aorta. Orthopedic fixation hardware in the lower cervical spine incidentally noted. IMPRESSION: 1. No radiographic evidence of acute cardiopulmonary disease. 2. Aortic atherosclerosis. Electronically Signed   By: Vinnie Langton M.D.   On: 02/21/2022 10:19     ------------------------------------------------------------------------------------------------------ Assessment/Plan: Principal Problem:   Hyponatremia  Rule out Lyme disease  -Presented with generalized weakness, headache, anorexia, vomiting.  Symptoms started since tick bite 5 days ago -Lab work sent from ED for severity of Lyme disease as well as [facility] spotted fever. Follow-up report.  Not sure how long it takes for the report to finalize.  I will empirically give 1 dose of doxycycline today.  Hyponatremia -Probably due to poor oral intake and vomiting. -Send urine studies. -Start normal saline at 100 mill per hour. -Repeat labs in the morning Recent Labs  Lab 02/21/22 1010  NA 125*   Headache -Probably part of the infection syndrome.  But given severity of her headache and history of pituitary tumor s/p radiation in the past, I would obtain a CT head -Norco as needed for pain  HTN -PTA on Toprol 25 mg daily, losartan 50  mg  daily -Continue both  CAD/HLD -PTA on aspirin 325 mg daily, Crestor 20 mg daily, -Continue both  history of pituitary tumor  -s/p radiation  GERD -PTA on Protonix -Continue Protonix  Arthritis chronic low back pain -PTA on Norco 5/325 mg as needed, ibuprofen -Continue Norco as needed  Mobility -PT eval  Goals of care - -  Code Status: Full Code full code  Diet:  Diet Order             Diet clear liquid Room service appropriate? Yes; Fluid consistency: Thin  Diet effective now                  DVT prophylaxis: Lovenox subcu enoxaparin (LOVENOX) injection 40 mg Start: 02/21/22 1800   Antimicrobials: Doxycycline 1 dose empirically Fluid: NS at 100 mill per hour Consultants: None Family Communication: Called and updated patient's son Adriana Jordan. Dispo: The patient is from: Home              Anticipated d/c is to: Home              Anticipated d/c date is: Pending clinical course  ------------------------------------------------------------------------------------- Severity of Illness: The appropriate patient status for this patient is OBSERVATION. Observation status is judged to be reasonable and necessary in order to provide the required intensity of service to ensure the patient's safety. The patient's presenting symptoms, physical exam findings, and initial radiographic and laboratory data in the context of their medical condition is felt to place them at decreased risk for further clinical deterioration. Furthermore, it is anticipated that the patient will be medically stable for discharge from the hospital within 2 midnights of admission.    Signed, Terrilee Croak, MD [facility] 02/21/2022

## 2022-02-22 ENCOUNTER — Observation Stay (HOSPITAL_COMMUNITY): Payer: Medicare PPO

## 2022-02-22 DIAGNOSIS — E871 Hypo-osmolality and hyponatremia: Secondary | ICD-10-CM | POA: Diagnosis not present

## 2022-02-22 LAB — SODIUM, URINE, RANDOM: Sodium, Ur: 118 mmol/L

## 2022-02-22 LAB — COMPREHENSIVE METABOLIC PANEL
ALT: 12 U/L (ref 0–44)
AST: 29 U/L (ref 15–41)
Albumin: 3.4 g/dL — ABNORMAL LOW (ref 3.5–5.0)
Alkaline Phosphatase: 46 U/L (ref 38–126)
Anion gap: 7 (ref 5–15)
BUN: 7 mg/dL — ABNORMAL LOW (ref 8–23)
CO2: 21 mmol/L — ABNORMAL LOW (ref 22–32)
Calcium: 8.6 mg/dL — ABNORMAL LOW (ref 8.9–10.3)
Chloride: 97 mmol/L — ABNORMAL LOW (ref 98–111)
Creatinine, Ser: 0.73 mg/dL (ref 0.44–1.00)
GFR, Estimated: >60 mL/min (ref >60)
Glucose, Bld: 98 mg/dL (ref 70–99)
Potassium: 3.1 mmol/L — ABNORMAL LOW (ref 3.5–5.1)
Sodium: 125 mmol/L — ABNORMAL LOW (ref 135–145)
Total Bilirubin: 0.7 mg/dL (ref 0.3–1.2)
Total Protein: 6.1 g/dL — ABNORMAL LOW (ref 6.5–8.1)

## 2022-02-22 LAB — OSMOLALITY, URINE: Osmolality, Ur: 452 mOsm/kg (ref 300–900)

## 2022-02-22 LAB — LYME DISEASE SEROLOGY W/REFLEX: Lyme Total Antibody EIA: NEGATIVE

## 2022-02-22 MED ORDER — POTASSIUM CHLORIDE CRYS ER 20 MEQ PO TBCR
40.0000 meq | EXTENDED_RELEASE_TABLET | Freq: Once | ORAL | Status: AC
  Administered 2022-02-22: 40 meq via ORAL
  Filled 2022-02-22: qty 2

## 2022-02-22 MED ORDER — DOXYCYCLINE HYCLATE 100 MG PO TABS
100.0000 mg | ORAL_TABLET | Freq: Two times a day (BID) | ORAL | Status: DC
  Administered 2022-02-22 – 2022-02-23 (×3): 100 mg via ORAL
  Filled 2022-02-22 (×3): qty 1

## 2022-02-22 NOTE — Evaluation (Signed)
Physical Therapy Evaluation Patient Details Name: Adriana Jordan MRN: [id number] DOB: 11-Jun-1935 Today's Date: 02/22/2022  History of Present Illness  Adriana Jordan is a 87 y.o. female with PMH significant for HTN, HLD, CAD, history of pituitary tumor status post radiation, GERD, arthritis, chronic low back pain.  Patient was brought to ED with complaint of weakness, vomiting and headache,  apparently had a tick bite about 5 days PTA. Patient foun to have hyponatremia  Clinical Impression  The patient admitted for above problems.  Patient mobilized in room with min guard, patient reports feeling weak. Patient should progress  to return home with support of son. Pt admitted with above diagnosis.   Pt currently with functional limitations due to the deficits listed below (see PT Problem List). Pt will benefit from skilled PT to increase their independence and safety with mobility to allow discharge to the venue listed below.    s mobilizing        Recommendations for follow up therapy are one component of a multi-disciplinary discharge planning process, led by the attending physician.  Recommendations may be updated based on patient status, additional functional criteria and insurance authorization.  Follow Up Recommendations No PT follow up      Assistance Recommended at Discharge PRN  Patient can return home with the following  Assist for transportation;Help with stairs or ramp for entrance    Equipment Recommendations None recommended by PT  Recommendations for Other Services       Functional Status Assessment Patient has had a recent decline in their functional status and demonstrates the ability to make significant improvements in function in a reasonable and predictable amount of time.     Precautions / Restrictions Precautions Precautions: Fall      Mobility  Bed Mobility Overal bed mobility: Independent                  Transfers Overall transfer level:  Needs assistance Equipment used: None Transfers: Sit to/from Stand, Bed to chair/wheelchair/BSC Sit to Stand: Supervision   Step pivot transfers: Supervision       General transfer comment: transfer to [facility] and back    Ambulation/Gait Ambulation/Gait assistance: Min guard Gait Distance (Feet): 50 Feet Assistive device: None (at times supported on BEd when ambulated) Gait Pattern/deviations: Step-through pattern Gait velocity: decr     General Gait Details: gait steady and slow, guarded  Stairs            Wheelchair Mobility    Modified Rankin (Stroke Patients Only)       Balance Overall balance assessment: Mild deficits observed, not formally tested                                           Pertinent Vitals/Pain Pain Assessment Pain Assessment: No/denies pain    Home Living Family/patient expects to be discharged to:: Private residence Living Arrangements: Children Available Help at Discharge: Family;Available 24 hours/day Type of Home: House Home Access: Stairs to enter   [employer] of Steps: 3   Home Layout: One level Home Equipment: None      Prior Function Prior Level of Function : Independent/Modified Independent             Mobility Comments: drives, shops       Hand Dominance        Extremity/Trunk Assessment   Upper Extremity Assessment  Upper Extremity Assessment: Overall WFL for tasks assessed    Lower Extremity Assessment Lower Extremity Assessment: Overall WFL for tasks assessed    Cervical / Trunk Assessment Cervical / Trunk Assessment: Normal  Communication   Communication: No difficulties  Cognition Arousal/Alertness: Awake/alert Behavior During Therapy: WFL for tasks assessed/performed, Flat affect Overall Cognitive Status: Within Functional Limits for tasks assessed                                          General Comments      Exercises     Assessment/Plan     PT Assessment Patient needs continued PT services  PT Problem List Decreased strength;Decreased activity tolerance;Decreased mobility       PT Treatment Interventions Therapeutic activities;DME instruction;Gait training;Functional mobility training    PT Goals (Current goals can be found in the Care Plan section)  Acute Rehab PT Goals Patient Stated Goal: to go home PT Goal Formulation: With patient Time For Goal Achievement: 03/08/22 Potential to Achieve Goals: Good    Frequency Min 3X/week     Co-evaluation               AM-PAC PT "6 Clicks" Mobility  Outcome Measure Help needed turning from your back to your side while in a flat bed without using bedrails?: None Help needed moving from lying on your back to sitting on the side of a flat bed without using bedrails?: None Help needed moving to and from a bed to a chair (including a wheelchair)?: A Little Help needed standing up from a chair using your arms (e.g., wheelchair or bedside chair)?: A Little Help needed to walk in hospital room?: A Little Help needed climbing 3-5 steps with a railing? : A Little 6 Click Score: 20    End of Session   Activity Tolerance: Patient limited by fatigue;Patient tolerated treatment well Patient left: in bed;with call bell/phone within reach;with bed alarm set Nurse Communication: Mobility status PT Visit Diagnosis: Unsteadiness on feet (R26.81);Muscle weakness (generalized) (M62.81);Difficulty in walking, not elsewhere classified (R26.2)    Time: 9470-9628 PT Time Calculation (min) (ACUTE ONLY): 13 min   Charges:   PT Evaluation $PT Eval Low Complexity: 1 Low          [facility] Office [phone] Weekend [phone]   Claretha Cooper 02/22/2022, 4:25 PM

## 2022-02-22 NOTE — Progress Notes (Signed)
PROGRESS NOTE  Adriana Jordan  DOB: 12-Jul-1935  PCP: Dixie Dials, MD KGY:[id number]  DOA: 02/21/2022  LOS: 0 days  Hospital Day: 2  Brief narrative: Adriana Jordan is a 87 y.o. female with PMH significant for HTN, HLD, CAD, history of pituitary tumor status post radiation, GERD, arthritis, chronic low back pain. Patient was brought to ED at [facility] by her son on 7/15 with complaint of weakness, vomiting and headache for 5 days.  She apparently had a tick bite about 5 days ago in her garden.  She states she picked out the tick and told her son later.  Subsequently, she started developing a red circular area on the left shoulder. At baseline, she lives at home and was walking 1 mile a day until a week ago.  She has intermittent vomiting and her appetite is always poor.  Since the tick bite, her appetite got worse, she started having persistent headache.   In the ED, patient was afebrile, heart rate in 60s, blood pressure was elevated to 134/75, breathing on room air Labs showed WC count of 3, hemoglobin 13.5, platelet 313, sodium 125, lactic acid 1, urinalysis normal.  Chest x-ray unremarkable Admitted to hospitalist service  Subjective: Patient was seen and examined this morning.  Pleasant elderly Caucasian female.  Propped up in bed.  Taking her breakfast.  Headache relieved.  Brother at bedside.  Assessment and plan: Rule out Lyme disease  -Presented with generalized weakness, headache, anorexia, vomiting.  Symptoms started since tick bite 5 days ago -Lab work sent from ED for severity of Lyme disease as well as [facility] spotted fever. Follow-up report.  Not sure how long it takes for the report to finalize.  Empirically on doxycycline.  Left shoulder cellulitis -Patient has a small area of redness at the site of tick bite and anterior aspect of left shoulder.  Very minimally tender.  No open drainage.  Does not seem to be expanding in size. -I have placed her on  doxycycline twice daily to cover the cellulitis as well as possible Lyme disease.  Neosporin ointment.   Hyponatremia -Probably due to poor oral intake and vomiting. -Urine studies sent. -Continue NS at 100 mill per hour. -Repeat labs in the morning Recent Labs  Lab 02/21/22 1010 02/22/22 0514  NA 125* 125*   Headache -Probably part of the infection syndrome.  CT scan of head negative. -Norco as needed for pain   HTN -PTA on Toprol 25 mg daily, losartan 50 mg daily -Continue both   CAD/HLD -PTA on aspirin 325 mg daily, Crestor 20 mg daily, -Continue both   history of pituitary tumor  -s/p radiation in the past   GERD -PTA on Protonix -Continue Protonix   Arthritis chronic low back pain -PTA on Norco 5/325 mg as needed, ibuprofen -Continue Norco as needed  Goals of care   Code Status: Full Code   Mobility: Pending PT eval  Skin assessment:     Nutritional status:  Body mass index is 21.49 kg/m.          Diet:  Diet Order             Diet clear liquid Room service appropriate? Yes; Fluid consistency: Thin  Diet effective now                   DVT prophylaxis:  enoxaparin (LOVENOX) injection 40 mg Start: 02/21/22 1800   Antimicrobials: Doxycycline twice daily Fluid: NS at 100  mill per hour Consultants: None Family Communication: Brother at bedside  Status is: Observation  Continue in-hospital care because: Pending Lyme disease serology, sodium level still low, pending PT eval Level of care: Telemetry   Dispo: The patient is from: Home              Anticipated d/c is to: Hopefully home in 1 to 2 days              Patient currently is not medically stable to d/c.   Difficult to place patient No     Infusions:   sodium chloride 100 mL/hr at 02/22/22 1553    Scheduled Meds:  doxycycline  100 mg Oral Once   doxycycline  100 mg Oral Q12H   enoxaparin (LOVENOX) injection  40 mg Subcutaneous Q24H   senna  1 tablet Oral BID    PRN  meds: acetaminophen **OR** acetaminophen, albuterol, hydrALAZINE, HYDROcodone-acetaminophen, ondansetron (ZOFRAN) IV, polyethylene glycol   Antimicrobials: Anti-infectives (From admission, onward)    Start     Dose/Rate Route Frequency Ordered Stop   02/22/22 1000  doxycycline (VIBRA-TABS) tablet 100 mg        100 mg Oral Every 12 hours 02/22/22 0818 02/27/22 0959   02/21/22 1815  doxycycline (VIBRA-TABS) tablet 100 mg        100 mg Oral  Once 02/21/22 1720         Objective: Vitals:   02/22/22 0342 02/22/22 1332  BP: 114/65 (!) 153/84  Pulse: 66 72  Resp: 17 16  Temp: (!) 97.5 F (36.4 C) 98.1 F (36.7 C)  SpO2: 90% 93%    Intake/Output Summary (Last 24 hours) at 02/22/2022 1615 Last data filed at 02/22/2022 1514 Gross per 24 hour  Intake 1260.31 ml  Output 700 ml  Net 560.31 ml   Filed Weights   02/21/22 0921 02/21/22 1525  Weight: 60.9 kg 60.4 kg   Weight change:  Body mass index is 21.49 kg/m.   Physical Exam: General exam: Pleasant, elderly Caucasian female.  Not in physical distress today Skin: No rashes, lesions or ulcers. HEENT: Atraumatic, normocephalic, no obvious bleeding Lungs: Clear to auscultation bilaterally.  Right anterior chest wall/left shoulder area with small area of cellulitis at the site of tick bite.  Picture below CVS: Regular rate and rhythm, no murmur GI/Abd soft, nontender, nondistended, bowels are present CNS: Alert, awake, oriented x3 Psychiatry: Mood appropriate Extremities: No pedal edema, no calf tenderness    Data Review: I have personally reviewed the laboratory data and studies available.  F/u labs ordered Unresulted Labs (From admission, onward)     Start     Ordered   02/23/22 1594  Basic metabolic panel  Daily at 5am,   R      02/22/22 1615   02/21/22 1703  Osmolality, urine  Once,   R        02/21/22 1702   02/21/22 1109  [facility]y [facility]tn spotted fvr abs pnl(IgG+IgM)  Once,   R        02/21/22 1108             Signed, Terrilee Croak, MD [facility] 02/22/2022

## 2022-02-23 DIAGNOSIS — M545 Low back pain, unspecified: Secondary | ICD-10-CM

## 2022-02-23 DIAGNOSIS — G8929 Other chronic pain: Secondary | ICD-10-CM

## 2022-02-23 DIAGNOSIS — Z8679 Personal history of other diseases of the circulatory system: Secondary | ICD-10-CM | POA: Diagnosis not present

## 2022-02-23 DIAGNOSIS — S20362A Insect bite (nonvenomous) of left front wall of thorax, initial encounter: Secondary | ICD-10-CM | POA: Diagnosis not present

## 2022-02-23 DIAGNOSIS — I10 Essential (primary) hypertension: Secondary | ICD-10-CM

## 2022-02-23 DIAGNOSIS — W57XXXA Bitten or stung by nonvenomous insect and other nonvenomous arthropods, initial encounter: Secondary | ICD-10-CM

## 2022-02-23 DIAGNOSIS — R531 Weakness: Secondary | ICD-10-CM

## 2022-02-23 DIAGNOSIS — R519 Headache, unspecified: Secondary | ICD-10-CM | POA: Diagnosis not present

## 2022-02-23 DIAGNOSIS — E871 Hypo-osmolality and hyponatremia: Secondary | ICD-10-CM | POA: Diagnosis not present

## 2022-02-23 LAB — CBC
HCT: 34.9 % — ABNORMAL LOW (ref 36.0–46.0)
Hemoglobin: 12.2 g/dL (ref 12.0–15.0)
MCH: 30.6 pg (ref 26.0–34.0)
MCHC: 35 g/dL (ref 30.0–36.0)
MCV: 87.5 fL (ref 80.0–100.0)
Platelets: 303 10*3/uL (ref 150–400)
RBC: 3.99 MIL/uL (ref 3.87–5.11)
RDW: 13.4 % (ref 11.5–15.5)
WBC: 2.9 10*3/uL — ABNORMAL LOW (ref 4.0–10.5)
nRBC: 0 % (ref 0.0–0.2)

## 2022-02-23 LAB — BASIC METABOLIC PANEL
Anion gap: 7 (ref 5–15)
BUN: 5 mg/dL — ABNORMAL LOW (ref 8–23)
CO2: 21 mmol/L — ABNORMAL LOW (ref 22–32)
Calcium: 8.9 mg/dL (ref 8.9–10.3)
Chloride: 102 mmol/L (ref 98–111)
Creatinine, Ser: 0.72 mg/dL (ref 0.44–1.00)
GFR, Estimated: >60 mL/min (ref >60)
Glucose, Bld: 78 mg/dL (ref 70–99)
Potassium: 3.6 mmol/L (ref 3.5–5.1)
Sodium: 130 mmol/L — ABNORMAL LOW (ref 135–145)

## 2022-02-23 LAB — TSH: TSH: 3.875 u[IU]/mL (ref 0.350–4.500)

## 2022-02-23 MED ORDER — SODIUM CHLORIDE 1 G PO TABS
1.0000 g | ORAL_TABLET | Freq: Three times a day (TID) | ORAL | Status: DC
  Administered 2022-02-23: 1 g via ORAL
  Filled 2022-02-23: qty 1

## 2022-02-23 MED ORDER — DOXYCYCLINE HYCLATE 100 MG PO TABS: 100.0000 mg | ORAL_TABLET | Freq: Two times a day (BID) | ORAL | 0 refills | Status: DC

## 2022-02-23 MED ORDER — SODIUM CHLORIDE 1 G PO TABS: 1.0000 g | ORAL_TABLET | Freq: Two times a day (BID) | ORAL | 0 refills | Status: DC

## 2022-02-23 NOTE — Plan of Care (Signed)

## 2022-02-23 NOTE — Progress Notes (Signed)
  Transition of Care [facility]) Screening Note   Patient Details  Name: Adriana Jordan Date of Birth: January 25, 1935   [facility] CM/SW Contact:    Vassie Moselle, LCSW Phone Number: 02/23/2022, 8:45 AM    Transition of Care [facility]) has reviewed patient and no TOC needs have been identified at this time. We will continue to monitor patient advancement through interdisciplinary progression rounds. If new patient transition needs arise, please place a TOC consult.

## 2022-02-23 NOTE — Discharge Summary (Signed)
Physician Discharge Summary  Adriana Jordan ACZ:[id number] DOB: 21-Nov-1934 DOA: 02/21/2022  PCP: Dixie Dials, MD  Admit date: 02/21/2022 Discharge date: 02/23/2022 Admitted From: Home Disposition: Home Recommendations for Outpatient Follow-up:  Follow up with PCP in in 1 week Check BMP and CBC at follow-up Please follow up on the following pending results: Lyme and RMSF serology  Home Health: Not indicated Equipment/Devices: Not indicated  Discharge Condition: Stable CODE STATUS: Full code  Follow-up Information     Dixie Dials, MD. Schedule an appointment as soon as possible for a visit in 1 week(s).   Specialty: Cardiology Contact information: [place] [address] [phone]                 Hospital course 87 year old F with PMH of CAD, HTN, HLD, chronic low back pain, arthritis, GERD, pituitary tumor s/p radiation presenting with nausea, generalized weakness and headache, and admitted for hyponatremia.  Patient reports tick bite to his left upper chest 5 days prior to presentation.   In ED, vitals stable.  WBC 3.  NA 125.  Lactic acid 1.  UA normal.  CXR without acute finding.  Patient was admitted for hyponatremia, rule out tickborne disease/Lyme disease and left shoulder cellulitis.  Lyme and RMSF serologies ordered.  She was started on IV fluid and doxycycline.   Hyponatremia improved to 130 with IV normal saline.  Urine sodium elevated to 113 suggesting SIADH.  She does not drink excessive water. TSH was within normal.  She is not on medications that could contribute to hyponatremia.  Patient is discharged on p.o. sodium chloride 1 g twice daily for 1 week.   In regards to tick bite, patient had mild circular erythema.  It is too early for her to have constitutional symptoms such as weakness, headache, anorexia and vomiting.  Lyme and RMSF serology were pending.  She is discharged on p.o. doxycycline 100 mg twice daily for 8 more  days..  Advised to avoid sun exposure  See individual problem list below for more.   Problems addressed during this hospitalization Hyponatremia: Improved with IV fluid but urine sodium suggests SIADH. -Discharged on p.o. sodium chloride 1 g twice daily for 1 week -Recommend repeat BMP in 1 week  Left shoulder cellulitis: Ruled out.  Erythema is likely from the tick bite  Possible tickborne disease: Lyme and RMSF serology pending. -Discharged on p.o. doxycycline for 8 more days.  Headache/generalized weakness/anorexia: Likely due to hyponatremia.  Resolved. -No need identified by therapy.  History of CAD/HLD: -Continue home Crestor. -Continued high-dose aspirin  GERD -Continue PPI  Chronic low back pain/arthritis -Continue home meds  Hypokalemia: Resolved.              Vital signs Vitals:   02/22/22 1332 02/22/22 1900 02/22/22 2000 02/23/22 0346  BP: (!) 153/84  (!) 150/78 126/82  Pulse: 72  73 81  Temp: 98.1 F (36.7 C)  97.8 F (36.6 C) 97.8 F (36.6 C)  Resp: '16  19 17  '$ Height:  '5\' 6"'$  (1.676 m) '5\' 6"'$  (1.676 m)   Weight:  60.4 kg 60.4 kg   SpO2: 93%  95% 92%  TempSrc:   Oral   BMI (Calculated):  21.5 21.5      Discharge exam  GENERAL: No apparent distress.  Nontoxic. HEENT: MMM.  Vision and hearing grossly intact.  NECK: Supple.  No apparent JVD.  RESP:  No IWOB.  Fair aeration bilaterally. CVS:  RRR. Heart sounds normal.  ABD/GI/GU: BS+. Abd soft, NTND.  MSK/EXT:  Moves extremities. No apparent deformity. No edema.  SKIN: Small erythematous nodule about a centimeter wide over left upper chest. NEURO: Awake and alert. Oriented appropriately.  No apparent focal neuro deficit. PSYCH: Calm. Normal affect.   Discharge Instructions Discharge Instructions     Call MD for:  difficulty breathing, headache or visual disturbances   Complete by: As directed    Call MD for:  extreme fatigue   Complete by: As directed    Call MD for:  persistant dizziness  or light-headedness   Complete by: As directed    Call MD for:  persistant nausea and vomiting   Complete by: As directed    Diet general   Complete by: As directed    Discharge instructions   Complete by: As directed    It has been a pleasure taking care of you!  You were hospitalized due to nausea, weakness and headache likely from low sodium level.  It is unclear what caused your low sodium level but medication such as losartan could contribute.  As such, we have stopped your losartan.  We are discharging you on sodium tablets for the next 1 week.  There was also concern about tick bite for which you have been started on doxycycline.  We are discharging him on doxycycline to complete treatment course. Please follow-up with your primary care doctor in 1 week to have your levels rechecked.  Review your new medication list and the directions on your medications before you take them.   Take care,   Increase activity slowly   Complete by: As directed       Allergies as of 02/23/2022       Reactions   Codeine Nausea And Vomiting   Tolerates hydrocodone        Medication List     STOP taking these medications    aspirin 325 MG tablet   HAIR SKIN & NAILS GUMMIES PO   ibuprofen 200 MG tablet Commonly known as: ADVIL   losartan 50 MG tablet Commonly known as: COZAAR       TAKE these medications    dicyclomine 20 MG tablet Commonly known as: BENTYL Take 1 tablet (20 mg total) by mouth 2 (two) times daily.   doxycycline 100 MG tablet Commonly known as: VIBRA-TABS Take 1 tablet (100 mg total) by mouth every 12 (twelve) hours for 8 days.   metoprolol succinate 25 MG 24 hr tablet Commonly known as: TOPROL-XL Take 25 mg by mouth at bedtime.   mirtazapine 15 MG tablet Commonly known as: REMERON Take 15 mg by mouth at bedtime.   nitroGLYCERIN 0.4 MG SL tablet Commonly known as: NITROSTAT Place 1 tablet (0.4 mg total) under the tongue every 5 (five) minutes x 3  doses as needed for chest pain.   pantoprazole 20 MG tablet Commonly known as: PROTONIX Take 2 tablets (40 mg total) by mouth daily for 14 days.   potassium chloride 10 MEQ tablet Commonly known as: KLOR-CON M Take 1 tablet (10 mEq total) by mouth daily.   PreserVision AREDS 2 Caps Take 1 capsule by mouth every morning.   PREVAGEN PO Take 1 tablet by mouth every morning.   rosuvastatin 20 MG tablet Commonly known as: CRESTOR Take 20 mg by mouth at bedtime.   sodium chloride 1 g tablet Take 1 tablet (1 g total) by mouth 2 (two) times daily with a meal for 7 days.  Consultations: None  Procedures/Studies:   CT HEAD WO CONTRAST (5MM)  Result Date: 02/22/2022 CLINICAL DATA:  Initial evaluation for headache. EXAM: CT HEAD WITHOUT CONTRAST TECHNIQUE: Contiguous axial images were obtained from the base of the skull through the vertex without intravenous contrast. RADIATION DOSE REDUCTION: This exam was performed according to the departmental dose-optimization program which includes automated exposure control, adjustment of the mA and/or kV according to patient size and/or use of iterative reconstruction technique. COMPARISON:  CT from 04/06/2021. FINDINGS: Brain: Generalized age-related cerebral atrophy. Patchy hypodensity involving the supratentorial cerebral white matter, most consistent with chronic small vessel ischemic disease, moderately advanced in nature. No acute intracranial hemorrhage. No acute large vessel territory infarct. No mass lesion, mass effect, or midline shift. No hydrocephalus or extra-axial fluid collection. Vascular: No hyperdense vessel. Scattered vascular calcifications noted within the carotid siphons. Skull: Scalp soft tissues and calvarium within normal limits. Sinuses/Orbits: Globes and orbital soft tissues within normal limits. Chronic right sphenoid sinusitis noted. Scattered mucosal thickening noted within the ethmoidal air cells. Mastoid air cells  remain clear. Other: None. IMPRESSION: 1. No acute intracranial abnormality. 2. Generalized age-related cerebral atrophy with moderate chronic small vessel ischemic disease. 3. Chronic right sphenoid sinusitis. Electronically Signed   By: Jeannine Boga M.D.   On: 02/22/2022 01:43   DG Chest Port 1 View  Result Date: 02/21/2022 CLINICAL DATA:  87 year old female with history of weakness and emesis with headache for the past 5 days. EXAM: PORTABLE CHEST 1 VIEW COMPARISON:  Chest x-ray 09/26/2021. FINDINGS: Lung volumes are normal. No consolidative airspace disease. No pleural effusions. No pneumothorax. No pulmonary nodule or mass noted. Pulmonary vasculature and the cardiomediastinal silhouette are within normal limits. Atherosclerosis in the thoracic aorta. Orthopedic fixation hardware in the lower cervical spine incidentally noted. IMPRESSION: 1. No radiographic evidence of acute cardiopulmonary disease. 2. Aortic atherosclerosis. Electronically Signed   By: Vinnie Langton M.D.   On: 02/21/2022 10:19       The results of significant diagnostics from this hospitalization (including imaging, microbiology, ancillary and laboratory) are listed below for reference.     Microbiology: Recent Results (from the past 240 hour(s))  Culture, blood (routine x 2)     Status: None (Preliminary result)   Collection Time: 02/21/22  9:20 AM   Specimen: Left Antecubital; Blood  Result Value Ref Range Status   Specimen Description   Final    LEFT ANTECUBITAL BLOOD Performed at [facility], [address] [place], [place] [address]    Special Requests   Final    Blood Culture adequate volume BOTTLES DRAWN AEROBIC AND ANAEROBIC Performed at [facility], [address] [place], [place] [address]    Culture   Final    NO GROWTH 2 DAYS Performed at [facility], [place] [address], [place], [place] [address]    Report Status PENDING  Incomplete  Culture, blood (routine x  2)     Status: None (Preliminary result)   Collection Time: 02/21/22 11:38 AM   Specimen: BLOOD RIGHT HAND  Result Value Ref Range Status   Specimen Description   Final    BLOOD RIGHT HAND Performed at [facility], [place] [address], [place], [place] [address]    Special Requests   Final    BOTTLES DRAWN AEROBIC ONLY Blood Culture adequate volume Performed at [facility], [address] [place], [place] [address]    Culture   Final  NO GROWTH 2 DAYS Performed at [facility], [place] [address] [place], [place] [address]    Report Status PENDING  Incomplete     Labs:  CBC: Recent Labs  Lab 02/21/22 0920 02/23/22 0602  WBC 3.0* 2.9*  NEUTROABS 0.6*  --   HGB 13.5 12.2  HCT 38.1 34.9*  MCV 85.0 87.5  PLT 313 303   BMP &GFR Recent Labs  Lab 02/21/22 1010 02/22/22 0514 02/23/22 0602  NA 125* 125* 130*  K 3.8 3.1* 3.6  CL 95* 97* 102  CO2 25 21* 21*  GLUCOSE 91 98 78  BUN 8 7* 5*  CREATININE 0.86 0.73 0.72  CALCIUM 8.5* 8.6* 8.9   Estimated Creatinine Clearance: 48.1 mL/min (by C-G formula based on SCr of 0.72 mg/dL). Liver & Pancreas: Recent Labs  Lab 02/21/22 1010 02/22/22 0514  AST 31 29  ALT 12 12  ALKPHOS 44 46  BILITOT 0.6 0.7  PROT 5.9* 6.1*  ALBUMIN 3.3* 3.4*   Recent Labs  Lab 02/21/22 1010  LIPASE 49   No results for input(s): "AMMONIA" in the last 168 hours. Diabetic: No results for input(s): "HGBA1C" in the last 72 hours. No results for input(s): "GLUCAP" in the last 168 hours. Cardiac Enzymes: No results for input(s): "CKTOTAL", "CKMB", "CKMBINDEX", "TROPONINI" in the last 168 hours. No results for input(s): "PROBNP" in the last 8760 hours. Coagulation Profile: No results for input(s): "INR", "PROTIME" in the last 168 hours. Thyroid Function Tests: Recent Labs    02/23/22 0602  TSH 3.875   Lipid Profile: No results for input(s): "CHOL", "HDL", "LDLCALC", "TRIG", "CHOLHDL", "LDLDIRECT" in the last 72  hours. Anemia Panel: No results for input(s): "VITAMINB12", "FOLATE", "FERRITIN", "TIBC", "IRON", "RETICCTPCT" in the last 72 hours. Urine analysis:    Component Value Date/Time   COLORURINE YELLOW 02/21/2022 1251   APPEARANCEUR CLEAR 02/21/2022 1251   LABSPEC 1.020 02/21/2022 1251   PHURINE 6.0 02/21/2022 1251   GLUCOSEU NEGATIVE 02/21/2022 1251   HGBUR NEGATIVE 02/21/2022 1251   BILIRUBINUR NEGATIVE 02/21/2022 1251   KETONESUR NEGATIVE 02/21/2022 1251   PROTEINUR NEGATIVE 02/21/2022 1251   UROBILINOGEN 0.2 03/01/2015 2045   NITRITE NEGATIVE 02/21/2022 1251   LEUKOCYTESUR NEGATIVE 02/21/2022 1251   Sepsis Labs: Invalid input(s): "PROCALCITONIN", "LACTICIDVEN"   SIGNED:  Mercy Riding, MD  [facility] 02/23/2022, 6:37 PM

## 2022-02-23 NOTE — Progress Notes (Signed)
Patient will be discharging home later today if tolerates soft diet. Family will transport patient home. Belongings will be returned. Education on medications will be provided.

## 2022-02-24 LAB — ROCKY MTN SPOTTED FVR ABS PNL(IGG+IGM)
RMSF IgG: NEGATIVE
RMSF IgM: 0.79 index (ref 0.00–0.89)

## 2022-02-25 ENCOUNTER — Telehealth: Payer: Self-pay | Admitting: *Deleted

## 2022-02-25 NOTE — Telephone Encounter (Signed)
Order faxed to [facility].   Encounter closed.

## 2022-02-25 NOTE — Telephone Encounter (Signed)
Incoming call from [facility], Estill Bamberg.   Order request sent to [facility] for follow-up left breast Dx MMG and Left breast ultrasound for left breast asymmetry on screening MMG 02/18/22. They will need order to schedule patient for f/u.    Return signed order to [phone]  Dr. Dellis Filbert -have you received this order?

## 2022-02-26 LAB — CULTURE, BLOOD (ROUTINE X 2)
Culture: NO GROWTH
Culture: NO GROWTH
Special Requests: ADEQUATE
Special Requests: ADEQUATE

## 2022-03-01 ENCOUNTER — Other Ambulatory Visit: Payer: Self-pay

## 2022-03-01 ENCOUNTER — Inpatient Hospital Stay (HOSPITAL_COMMUNITY)
Admission: EM | Admit: 2022-03-01 | Discharge: 2022-03-05 | DRG: 643 | Disposition: A | Discharge status: Skilled Nursing Facility | Payer: Medicare PPO | Attending: Internal Medicine | Admitting: Internal Medicine

## 2022-03-01 ENCOUNTER — Telehealth: Payer: Self-pay | Admitting: *Deleted

## 2022-03-01 ENCOUNTER — Emergency Department (HOSPITAL_COMMUNITY): Payer: Medicare PPO

## 2022-03-01 ENCOUNTER — Encounter (HOSPITAL_COMMUNITY): Payer: Self-pay

## 2022-03-01 ENCOUNTER — Inpatient Hospital Stay (HOSPITAL_COMMUNITY): Payer: Medicare PPO

## 2022-03-01 DIAGNOSIS — E43 Unspecified severe protein-calorie malnutrition: Secondary | ICD-10-CM | POA: Insufficient documentation

## 2022-03-01 DIAGNOSIS — Z923 Personal history of irradiation: Secondary | ICD-10-CM

## 2022-03-01 DIAGNOSIS — E875 Hyperkalemia: Secondary | ICD-10-CM | POA: Diagnosis present

## 2022-03-01 DIAGNOSIS — E2740 Unspecified adrenocortical insufficiency: Secondary | ICD-10-CM | POA: Diagnosis present

## 2022-03-01 DIAGNOSIS — Z87891 Personal history of nicotine dependence: Secondary | ICD-10-CM

## 2022-03-01 DIAGNOSIS — E876 Hypokalemia: Secondary | ICD-10-CM

## 2022-03-01 DIAGNOSIS — Z20822 Contact with and (suspected) exposure to covid-19: Secondary | ICD-10-CM | POA: Diagnosis present

## 2022-03-01 DIAGNOSIS — S20369A Insect bite (nonvenomous) of unspecified front wall of thorax, initial encounter: Secondary | ICD-10-CM | POA: Diagnosis present

## 2022-03-01 DIAGNOSIS — E860 Dehydration: Secondary | ICD-10-CM | POA: Diagnosis present

## 2022-03-01 DIAGNOSIS — G8929 Other chronic pain: Secondary | ICD-10-CM | POA: Diagnosis present

## 2022-03-01 DIAGNOSIS — Z885 Allergy status to narcotic agent status: Secondary | ICD-10-CM | POA: Diagnosis not present

## 2022-03-01 DIAGNOSIS — Z6821 Body mass index (BMI) 21.0-21.9, adult: Secondary | ICD-10-CM

## 2022-03-01 DIAGNOSIS — I2510 Atherosclerotic heart disease of native coronary artery without angina pectoris: Secondary | ICD-10-CM | POA: Diagnosis present

## 2022-03-01 DIAGNOSIS — R627 Adult failure to thrive: Secondary | ICD-10-CM | POA: Diagnosis present

## 2022-03-01 DIAGNOSIS — R296 Repeated falls: Secondary | ICD-10-CM

## 2022-03-01 DIAGNOSIS — G9341 Metabolic encephalopathy: Secondary | ICD-10-CM | POA: Diagnosis present

## 2022-03-01 DIAGNOSIS — K219 Gastro-esophageal reflux disease without esophagitis: Secondary | ICD-10-CM | POA: Diagnosis present

## 2022-03-01 DIAGNOSIS — D352 Benign neoplasm of pituitary gland: Secondary | ICD-10-CM | POA: Diagnosis present

## 2022-03-01 DIAGNOSIS — W57XXXA Bitten or stung by nonvenomous insect and other nonvenomous arthropods, initial encounter: Secondary | ICD-10-CM

## 2022-03-01 DIAGNOSIS — M545 Low back pain, unspecified: Secondary | ICD-10-CM | POA: Diagnosis present

## 2022-03-01 DIAGNOSIS — I252 Old myocardial infarction: Secondary | ICD-10-CM | POA: Diagnosis not present

## 2022-03-01 DIAGNOSIS — E871 Hypo-osmolality and hyponatremia: Secondary | ICD-10-CM | POA: Diagnosis present

## 2022-03-01 DIAGNOSIS — Z85828 Personal history of other malignant neoplasm of skin: Secondary | ICD-10-CM

## 2022-03-01 DIAGNOSIS — Z79899 Other long term (current) drug therapy: Secondary | ICD-10-CM

## 2022-03-01 DIAGNOSIS — E872 Acidosis, unspecified: Secondary | ICD-10-CM | POA: Diagnosis present

## 2022-03-01 DIAGNOSIS — R4182 Altered mental status, unspecified: Principal | ICD-10-CM

## 2022-03-01 DIAGNOSIS — R531 Weakness: Secondary | ICD-10-CM

## 2022-03-01 DIAGNOSIS — N179 Acute kidney failure, unspecified: Secondary | ICD-10-CM | POA: Diagnosis present

## 2022-03-01 DIAGNOSIS — I10 Essential (primary) hypertension: Secondary | ICD-10-CM | POA: Diagnosis present

## 2022-03-01 DIAGNOSIS — E785 Hyperlipidemia, unspecified: Secondary | ICD-10-CM | POA: Diagnosis present

## 2022-03-01 DIAGNOSIS — Z981 Arthrodesis status: Secondary | ICD-10-CM

## 2022-03-01 DIAGNOSIS — E440 Moderate protein-calorie malnutrition: Secondary | ICD-10-CM

## 2022-03-01 DIAGNOSIS — E7800 Pure hypercholesterolemia, unspecified: Secondary | ICD-10-CM | POA: Diagnosis present

## 2022-03-01 DIAGNOSIS — Z955 Presence of coronary angioplasty implant and graft: Secondary | ICD-10-CM

## 2022-03-01 DIAGNOSIS — E861 Hypovolemia: Secondary | ICD-10-CM | POA: Diagnosis present

## 2022-03-01 LAB — URINALYSIS, ROUTINE W REFLEX MICROSCOPIC
Bilirubin Urine: NEGATIVE
Glucose, UA: NEGATIVE mg/dL
Hgb urine dipstick: NEGATIVE
Ketones, ur: NEGATIVE mg/dL
Leukocytes,Ua: NEGATIVE
Nitrite: NEGATIVE
Protein, ur: NEGATIVE mg/dL
Specific Gravity, Urine: 1.009 (ref 1.005–1.030)
pH: 5 (ref 5.0–8.0)

## 2022-03-01 LAB — RENAL FUNCTION PANEL
Albumin: 2.7 g/dL — ABNORMAL LOW (ref 3.5–5.0)
Albumin: 3.4 g/dL — ABNORMAL LOW (ref 3.5–5.0)
Anion gap: 8 (ref 5–15)
Anion gap: 13 (ref 5–15)
BUN: 13 mg/dL (ref 8–23)
BUN: 13 mg/dL (ref 8–23)
CO2: 22 mmol/L (ref 22–32)
CO2: 23 mmol/L (ref 22–32)
Calcium: 8.2 mg/dL — ABNORMAL LOW (ref 8.9–10.3)
Calcium: 9.3 mg/dL (ref 8.9–10.3)
Chloride: 100 mmol/L (ref 98–111)
Chloride: 95 mmol/L — ABNORMAL LOW (ref 98–111)
Creatinine, Ser: 0.85 mg/dL (ref 0.44–1.00)
Creatinine, Ser: 0.98 mg/dL (ref 0.44–1.00)
GFR, Estimated: >60 mL/min (ref >60)
GFR, Estimated: 57 mL/min — ABNORMAL LOW (ref >60)
Glucose, Bld: 83 mg/dL (ref 70–99)
Glucose, Bld: 73 mg/dL (ref 70–99)
Phosphorus: 3 mg/dL (ref 2.5–4.6)
Phosphorus: 3.7 mg/dL (ref 2.5–4.6)
Potassium: 3 mmol/L — ABNORMAL LOW (ref 3.5–5.1)
Potassium: 3.4 mmol/L — ABNORMAL LOW (ref 3.5–5.1)
Sodium: 130 mmol/L — ABNORMAL LOW (ref 135–145)
Sodium: 131 mmol/L — ABNORMAL LOW (ref 135–145)

## 2022-03-01 LAB — CBC WITH DIFFERENTIAL/PLATELET
Abs Immature Granulocytes: 0 10*3/uL (ref 0.00–0.07)
Basophils Absolute: 0 10*3/uL (ref 0.0–0.1)
Basophils Relative: 1 %
Eosinophils Absolute: 0.1 10*3/uL (ref 0.0–0.5)
Eosinophils Relative: 4 %
HCT: 35.6 % — ABNORMAL LOW (ref 36.0–46.0)
Hemoglobin: 12.4 g/dL (ref 12.0–15.0)
Immature Granulocytes: 0 %
Lymphocytes Relative: 37 %
Lymphs Abs: 1.1 10*3/uL (ref 0.7–4.0)
MCH: 30.5 pg (ref 26.0–34.0)
MCHC: 34.8 g/dL (ref 30.0–36.0)
MCV: 87.5 fL (ref 80.0–100.0)
Monocytes Absolute: 0.4 10*3/uL (ref 0.1–1.0)
Monocytes Relative: 14 %
Neutro Abs: 1.3 10*3/uL — ABNORMAL LOW (ref 1.7–7.7)
Neutrophils Relative %: 44 %
Platelets: 369 10*3/uL (ref 150–400)
RBC: 4.07 MIL/uL (ref 3.87–5.11)
RDW: 13.4 % (ref 11.5–15.5)
WBC: 3 10*3/uL — ABNORMAL LOW (ref 4.0–10.5)
nRBC: 0 % (ref 0.0–0.2)

## 2022-03-01 LAB — BLOOD GAS, ARTERIAL
Acid-Base Excess: 1.4 mmol/L (ref 0.0–2.0)
Bicarbonate: 24.7 mmol/L (ref 20.0–28.0)
O2 Saturation: 97.6 %
Patient temperature: 37
pCO2 arterial: 34 mmHg (ref 32–48)
pH, Arterial: 7.47 — ABNORMAL HIGH (ref 7.35–7.45)
pO2, Arterial: 77 mmHg — ABNORMAL LOW (ref 83–108)

## 2022-03-01 LAB — RESP PANEL BY RT-PCR (FLU A&B, COVID) ARPGX2
Influenza A by PCR: NEGATIVE
Influenza B by PCR: NEGATIVE
SARS Coronavirus 2 by RT PCR: NEGATIVE

## 2022-03-01 LAB — COMPREHENSIVE METABOLIC PANEL
ALT: 33 U/L (ref 0–44)
AST: 69 U/L — ABNORMAL HIGH (ref 15–41)
Albumin: 3.6 g/dL (ref 3.5–5.0)
Alkaline Phosphatase: 68 U/L (ref 38–126)
Anion gap: 10 (ref 5–15)
BUN: 17 mg/dL (ref 8–23)
CO2: 25 mmol/L (ref 22–32)
Calcium: 9.1 mg/dL (ref 8.9–10.3)
Chloride: 93 mmol/L — ABNORMAL LOW (ref 98–111)
Creatinine, Ser: 1.12 mg/dL — ABNORMAL HIGH (ref 0.44–1.00)
GFR, Estimated: 48 mL/min — ABNORMAL LOW (ref >60)
Glucose, Bld: 88 mg/dL (ref 70–99)
Potassium: 3.5 mmol/L (ref 3.5–5.1)
Sodium: 128 mmol/L — ABNORMAL LOW (ref 135–145)
Total Bilirubin: 0.5 mg/dL (ref 0.3–1.2)
Total Protein: 7.1 g/dL (ref 6.5–8.1)

## 2022-03-01 LAB — PHOSPHORUS: Phosphorus: 3.8 mg/dL (ref 2.5–4.6)

## 2022-03-01 LAB — I-STAT CHEM 8, ED
BUN: 17 mg/dL (ref 8–23)
Calcium, Ion: 1.14 mmol/L — ABNORMAL LOW (ref 1.15–1.40)
Chloride: 89 mmol/L — ABNORMAL LOW (ref 98–111)
Creatinine, Ser: 1.2 mg/dL — ABNORMAL HIGH (ref 0.44–1.00)
Glucose, Bld: 85 mg/dL (ref 70–99)
HCT: 39 % (ref 36.0–46.0)
Hemoglobin: 13.3 g/dL (ref 12.0–15.0)
Potassium: 3.4 mmol/L — ABNORMAL LOW (ref 3.5–5.1)
Sodium: 126 mmol/L — ABNORMAL LOW (ref 135–145)
TCO2: 26 mmol/L (ref 22–32)

## 2022-03-01 LAB — MAGNESIUM: Magnesium: 1.8 mg/dL (ref 1.7–2.4)

## 2022-03-01 LAB — AMMONIA: Ammonia: <10 umol/L (ref 9–35)

## 2022-03-01 LAB — TROPONIN I (HIGH SENSITIVITY): Troponin I (High Sensitivity): 5 ng/L (ref <18)

## 2022-03-01 LAB — CBG MONITORING, ED: Glucose-Capillary: 79 mg/dL (ref 70–99)

## 2022-03-01 LAB — CK: Total CK: 584 U/L — ABNORMAL HIGH (ref 38–234)

## 2022-03-01 MED ORDER — PANTOPRAZOLE SODIUM 40 MG PO TBEC
40.0000 mg | DELAYED_RELEASE_TABLET | Freq: Every day | ORAL | Status: DC
  Administered 2022-03-01 – 2022-03-05 (×5): 40 mg via ORAL
  Filled 2022-03-01 (×5): qty 1

## 2022-03-01 MED ORDER — HYDRALAZINE HCL 20 MG/ML IJ SOLN: 10.0000 mg | Freq: Three times a day (TID) | INTRAMUSCULAR | Status: DC | PRN

## 2022-03-01 MED ORDER — HEPARIN SODIUM (PORCINE) 5000 UNIT/ML IJ SOLN
5000.0000 [IU] | Freq: Three times a day (TID) | INTRAMUSCULAR | Status: DC
Start: 2022-03-01 — End: 2022-03-05
  Administered 2022-03-01 – 2022-03-05 (×12): 5000 [IU] via SUBCUTANEOUS
  Filled 2022-03-01 (×13): qty 1

## 2022-03-01 MED ORDER — ONDANSETRON HCL 4 MG PO TABS: 4.0000 mg | ORAL_TABLET | Freq: Four times a day (QID) | ORAL | Status: DC | PRN

## 2022-03-01 MED ORDER — ACETAMINOPHEN 325 MG PO TABS: 650.0000 mg | ORAL_TABLET | Freq: Four times a day (QID) | ORAL | Status: DC | PRN

## 2022-03-01 MED ORDER — POTASSIUM CHLORIDE CRYS ER 20 MEQ PO TBCR
40.0000 meq | EXTENDED_RELEASE_TABLET | Freq: Two times a day (BID) | ORAL | Status: DC
  Administered 2022-03-01 (×2): 40 meq via ORAL
  Filled 2022-03-01 (×2): qty 2

## 2022-03-01 MED ORDER — SODIUM CHLORIDE 0.9 % IV SOLN: INTRAVENOUS | Status: DC

## 2022-03-01 MED ORDER — GADOBUTROL 1 MMOL/ML IV SOLN
6.0000 mL | Freq: Once | INTRAVENOUS | Status: AC | PRN
  Administered 2022-03-01: 6 mL via INTRAVENOUS

## 2022-03-01 MED ORDER — HYDROCODONE-ACETAMINOPHEN 5-325 MG PO TABS
1.0000 | ORAL_TABLET | ORAL | Status: DC | PRN
  Administered 2022-03-02: 2 via ORAL
  Filled 2022-03-01: qty 2

## 2022-03-01 MED ORDER — LACTATED RINGERS IV BOLUS
500.0000 mL | Freq: Once | INTRAVENOUS | Status: AC
  Administered 2022-03-01: 500 mL via INTRAVENOUS

## 2022-03-01 MED ORDER — PROSIGHT PO TABS
1.0000 | ORAL_TABLET | Freq: Every morning | ORAL | Status: DC
  Administered 2022-03-02 – 2022-03-05 (×4): 1 via ORAL
  Filled 2022-03-01 (×4): qty 1

## 2022-03-01 MED ORDER — ONDANSETRON HCL 4 MG/2ML IJ SOLN
4.0000 mg | Freq: Four times a day (QID) | INTRAMUSCULAR | Status: DC | PRN
  Administered 2022-03-01: 4 mg via INTRAVENOUS
  Filled 2022-03-01: qty 2

## 2022-03-01 MED ORDER — DOXYCYCLINE HYCLATE 100 MG PO TABS
100.0000 mg | ORAL_TABLET | Freq: Two times a day (BID) | ORAL | Status: AC
  Administered 2022-03-01 – 2022-03-03 (×6): 100 mg via ORAL
  Filled 2022-03-01 (×6): qty 1

## 2022-03-01 MED ORDER — DICYCLOMINE HCL 20 MG PO TABS
20.0000 mg | ORAL_TABLET | Freq: Two times a day (BID) | ORAL | Status: DC
  Administered 2022-03-01 – 2022-03-05 (×9): 20 mg via ORAL
  Filled 2022-03-01 (×9): qty 1

## 2022-03-01 MED ORDER — ACETAMINOPHEN 650 MG RE SUPP: 650.0000 mg | Freq: Four times a day (QID) | RECTAL | Status: DC | PRN

## 2022-03-01 MED ORDER — SODIUM CHLORIDE 1 G PO TABS
1.0000 g | ORAL_TABLET | Freq: Two times a day (BID) | ORAL | Status: DC
  Administered 2022-03-01 – 2022-03-05 (×8): 1 g via ORAL
  Filled 2022-03-01 (×9): qty 1

## 2022-03-01 NOTE — ED Notes (Signed)
Son, Jiles Prows, was called by this Probation officer and advised that pt is in room and he is welcome to come be with her whenever he is able. Son tells this Probation officer that he picked her up from hospital on Tuesday (7/18) and she walked into the house, however by Wednesday (7/19), she required assistance for all ADLs. He also states that he found her on the bathroom floor yesterday morning (7/23) and this morning found her on the floor between her bed and a piece of furniture.   Jiles Prows can be reached at [phone] and says he will be coming to the hospital shortly.

## 2022-03-01 NOTE — ED Triage Notes (Signed)
Bibems sp fall at home. Pt was standing up w/ assist to use bedside commode. Denies injury. Did not hit head. No loc. No thinners. Pt is somnolent but arouseable to voice and aox3. Gcs 14. Per ems family is more concerned for a tick bite pt had on her right upper chest which occurred x2wks ago. At that time pt was treated her and discharged w/ abx. Per ems family is concerned for increased lethargy and weakness after that incident. Vss. Nadn.

## 2022-03-01 NOTE — H&P (Signed)
History and Physical    Patient: Adriana Jordan FIE:[id number] DOB: [birth year] DOA: 03/01/2022 DOS: the patient was seen and examined on 03/01/2022 PCP: Dixie Dials, MD  Patient coming from: Home  Chief Complaint:  Chief Complaint  Patient presents with   Fall   HPI: Adriana Jordan is a 87 y.o. female with medical history significant of pituitary adenoma, HTN, HLD. Presenting with generalized weakness. She was recently admitted for the same. She was discharged to home about 6 days ago. History is from her son. He reports that she has been weak at home since discharge. She has been eating very little and complains of nausea. She has had 2 unwitnessed falls since her discharge. In his opinion, she seems to be getting progressively worse. When her symptoms did not improve, he decided to bring her to the hospital for evaluation.    Review of Systems: unable to review all systems due to the inability of the patient to answer questions. Past Medical History:  Diagnosis Date   Arthritis    "all over" (11/14/2017)   Brain tumor ([place])    "I still have it"; not cancer (11/14/2017)   Chronic lower back pain    Coronary artery disease    Dr. Doylene Canard   GERD (gastroesophageal reflux disease)    History of blood transfusion    "don't remember why" (11/14/2017)   History of radiation therapy 07/03/2018   brain, pituitary/ 12.5 Gy in 1 fraction   Hypercholesteremia    Hypertension    MI (myocardial infarction) ([place]) 2006   PONV (postoperative nausea and vomiting)    Skin cancer    "right forearm"   Past Surgical History:  Procedure Laterality Date   ANTERIOR CERVICAL DECOMP/DISCECTOMY FUSION     BACK SURGERY     CARPAL TUNNEL RELEASE Bilateral    CORONARY ANGIOPLASTY     DES mid LAD and mid RCA 09/21/04   CRANIOTOMY N/A 05/21/2015   Procedure: Transsphenoidal resection of pituitary tumor with Dr. Radene Journey for approach;  Surgeon: Leeroy Cha, MD;  Location: [facility] NEURO ORS;  Service:  Neurosurgery;  Laterality: N/A;  Transsphenoidal resection of pituitary tumor with Dr. Radene Journey for approach   SHOULDER OPEN ROTATOR CUFF REPAIR Bilateral    SKIN CANCER EXCISION Right    forearm   thumb surgery Right    placed srews to make straight   THYROID SURGERY     "goiter removed"   Social History:  reports that she quit smoking about 52 years ago. Her smoking use included cigarettes. She has a 0.60 pack-year smoking history. She has never used smokeless tobacco. She reports that she does not currently use alcohol. She reports that she does not use drugs.  Allergies  Allergen Reactions   Codeine Nausea And Vomiting    Tolerates hydrocodone    Family History  Problem Relation Age of Onset   Cancer Father 5       throat- smoker    Breast cancer Sister 85   Cancer Brother 47       lung- smoker    Prior to Admission medications   Medication Sig Start Date End Date Taking? Authorizing Provider  Apoaequorin (PREVAGEN PO) Take 1 tablet by mouth every morning.    [provider]  dicyclomine (BENTYL) 20 MG tablet Take 1 tablet (20 mg total) by mouth 2 (two) times daily. Patient not taking: Reported on 02/22/2022 12/19/21   Gareth Morgan, MD  doxycycline (VIBRA-TABS) 100 MG tablet Take 1  tablet (100 mg total) by mouth every 12 (twelve) hours for 8 days. 02/23/22 03/03/22  Mercy Riding, MD  metoprolol succinate (TOPROL-XL) 25 MG 24 hr tablet Take 25 mg by mouth at bedtime. Patient not taking: Reported on 02/22/2022 09/17/21   [provider]  mirtazapine (REMERON) 15 MG tablet Take 15 mg by mouth at bedtime. Patient not taking: Reported on 02/22/2022 09/10/21   [provider]  Multiple Vitamins-Minerals (PRESERVISION AREDS 2) CAPS Take 1 capsule by mouth every morning.    [provider]  nitroGLYCERIN (NITROSTAT) 0.4 MG SL tablet Place 1 tablet (0.4 mg total) under the tongue every 5 (five) minutes x 3 doses as needed for chest pain. Patient  not taking: Reported on 02/22/2022 09/13/21   Dixie Dials, MD  pantoprazole (PROTONIX) 20 MG tablet Take 2 tablets (40 mg total) by mouth daily for 14 days. Patient not taking: Reported on 02/22/2022 12/19/21 02/21/22  Gareth Morgan, MD  potassium chloride (KLOR-CON M) 10 MEQ tablet Take 1 tablet (10 mEq total) by mouth daily. Patient not taking: Reported on 02/22/2022 09/14/21   Dixie Dials, MD  rosuvastatin (CRESTOR) 20 MG tablet Take 20 mg by mouth at bedtime. Patient not taking: Reported on 02/22/2022 07/17/21   [provider]  sodium chloride 1 g tablet Take 1 tablet (1 g total) by mouth 2 (two) times daily with a meal for 7 days. 02/23/22 03/02/22  Mercy Riding, MD    Physical Exam: Vitals:   03/01/22 0845 03/01/22 0900 03/01/22 0930 03/01/22 1030  BP:  (!) 157/75 137/81 128/73  Pulse: 68 69 70 69  Resp: 19 18 (!) 24 15  Temp:      TempSrc:      SpO2: 97% 96% 97% 98%   General: 87 y.o. female resting in bed in NAD Eyes: PERRL, normal sclera ENMT: Nares patent w/o discharge, orophaynx clear, dentition normal, ears w/o discharge/lesions/ulcers Neck: Supple, trachea midline Cardiovascular: RRR, +S1, S2, no m/g/r, equal pulses throughout Respiratory: CTABL, no w/r/r, normal WOB GI: BS+, NDNT, no masses noted, no organomegaly noted MSK: No e/c/c Neuro: A&O x 3, no focal deficits Psyc: slow to answer, but appropriate conversation, calm/cooperative  Data Reviewed:  Lab Results  Component Value Date   NA 126 (L) 03/01/2022   K 3.4 (L) 03/01/2022   CO2 25 03/01/2022   GLUCOSE 85 03/01/2022   BUN 17 03/01/2022   CREATININE 1.20 (H) 03/01/2022   CALCIUM 9.1 03/01/2022   GFRNONAA 48 (L) 03/01/2022   Lab Results  Component Value Date   WBC 3.0 (L) 03/01/2022   HGB 13.3 03/01/2022   HCT 39.0 03/01/2022   MCV 87.5 03/01/2022   PLT 369 03/01/2022   CTH 1. No evidence of acute intracranial abnormality. 2. Moderate chronic small vessel ischemic disease.  CXR 1.  No  radiographic evidence of acute cardiopulmonary disease. 2. Aortic atherosclerosis.  XR Lumbar spine 1. No acute radiographic abnormality of the lumbar spine. 2. Multilevel degenerative disc disease and lumbar spondylosis, as above.  Assessment and Plan: Hypontremia Hypokalemia     - admit to inpt, med-surg     - replace K+; check Mg2+     - not clear if she is taking her salt tablets, will resume      - follow q6h renal function panels  Generalized weakness FTT Multiple falls Moderate protein-calorie malnutrition     - son reports that she has not been eating for some time     -  dietitian consult     - PT/OT consults     - rehydrate her and encourage diet     - fix lytes     - UA negative, CXR negative     - ammonia negative, ABG ok     - check CK     - CTH is negative; MRI brain ordered  AKI     - renal US     - fluids, watch nephrotoxins  HTN     - losartan was discontinued at last admission d/t AKI; she was sent home on metoprolol only     - her HR is in the 60s; question if this is contributing to her weakness     - will hold BB for now, can control BP w/ hydralazine for now  HLD     - hold home regimen for now pending CK results   GERD     - continue home regimen   Pituitary tumor     - s/p radiation     - MRI brain ordered as part of w/u above     - continue outpt follow up  Tick bite     - recently discharge on doxy to complete 03/03/22; continue     - titres were negative   Advance Care Planning:   Code Status: FULL  Consults: None  Family Communication: spoke with son by phone  Severity of Illness: The appropriate patient status for this patient is INPATIENT. Inpatient status is judged to be reasonable and necessary in order to provide the required intensity of service to ensure the patient's safety. The patient's presenting symptoms, physical exam findings, and initial radiographic and laboratory data in the context of their chronic comorbidities  is felt to place them at high risk for further clinical deterioration. Furthermore, it is not anticipated that the patient will be medically stable for discharge from the hospital within 2 midnights of admission.   * I certify that at the point of admission it is my clinical judgment that the patient will require inpatient hospital care spanning beyond 2 midnights from the point of admission due to high intensity of service, high risk for further deterioration and high frequency of surveillance required.*  Author: Jonnie Finner, DO 03/01/2022 10:52 AM  For on call review [email or web address].

## 2022-03-01 NOTE — ED Provider Notes (Signed)
[facility] DEPT Provider Note   CSN: [id number] Arrival date & time: 03/01/22  9518     History  Chief Complaint  Patient presents with   Adriana Jordan    Adriana Jordan is a 87 y.o. female.  HPI 87 year old female presents with generalized weakness and confusion.  History is from [place], her son, over the phone.  Patient was bit by a tick and then subsequently admitted to the hospital and discharged 6 days ago.  She was walking while in the hospital but now the son states that starting 1 day later she cannot walk.  Seems like both legs are dragging.  He has been staying with her and yesterday and this morning he found her on the floor.  He had last seen her about 3 hours prior and then around 5 AM found her on the floor today.  Does not seem like any obvious injuries.  Patient did seem somewhat confused and was talking about her dog that did not make sense.  Besides the medications prescribed, doxycycline and sodium chloride, no other recent medication changes.  Patient tells me that she was just in the hospital yesterday and currently has a headache and some sore throat.  Otherwise, patient is quite sleepy and while I can get up to talk to me a little bit, she will fairly quickly go back to sleep.  Home Medications Prior to Admission medications   Medication Sig Start Date End Date Taking? Authorizing Provider  Apoaequorin (PREVAGEN PO) Take 1 tablet by mouth every morning.    [provider]  dicyclomine (BENTYL) 20 MG tablet Take 1 tablet (20 mg total) by mouth 2 (two) times daily. Patient not taking: Reported on 02/22/2022 12/19/21   Gareth Morgan, MD  doxycycline (VIBRA-TABS) 100 MG tablet Take 1 tablet (100 mg total) by mouth every 12 (twelve) hours for 8 days. 02/23/22 03/03/22  Mercy Riding, MD  metoprolol succinate (TOPROL-XL) 25 MG 24 hr tablet Take 25 mg by mouth at bedtime. Patient not taking: Reported on 02/22/2022 09/17/21   [provider]  mirtazapine (REMERON) 15 MG tablet Take 15 mg by mouth at bedtime. Patient not taking: Reported on 02/22/2022 09/10/21   [provider]  Multiple Vitamins-Minerals (PRESERVISION AREDS 2) CAPS Take 1 capsule by mouth every morning.    [provider]  nitroGLYCERIN (NITROSTAT) 0.4 MG SL tablet Place 1 tablet (0.4 mg total) under the tongue every 5 (five) minutes x 3 doses as needed for chest pain. Patient not taking: Reported on 02/22/2022 09/13/21   Dixie Dials, MD  pantoprazole (PROTONIX) 20 MG tablet Take 2 tablets (40 mg total) by mouth daily for 14 days. Patient not taking: Reported on 02/22/2022 12/19/21 02/21/22  Gareth Morgan, MD  potassium chloride (KLOR-CON M) 10 MEQ tablet Take 1 tablet (10 mEq total) by mouth daily. Patient not taking: Reported on 02/22/2022 09/14/21   Dixie Dials, MD  rosuvastatin (CRESTOR) 20 MG tablet Take 20 mg by mouth at bedtime. Patient not taking: Reported on 02/22/2022 07/17/21   [provider]  sodium chloride 1 g tablet Take 1 tablet (1 g total) by mouth 2 (two) times daily with a meal for 7 days. 02/23/22 03/02/22  Mercy Riding, MD      Allergies    Codeine    Review of Systems   Review of Systems  Unable to perform ROS: Mental status change    Physical Exam Updated Vital Signs BP (!) 147/72  Pulse 65   Temp (!) 97.5 F (36.4 C) (Oral)   Resp 19   LMP  (LMP Unknown)   SpO2 95%  Physical Exam Vitals and nursing note reviewed.  Constitutional:      Appearance: She is well-developed.  HENT:     Head: Normocephalic and atraumatic.     Mouth/Throat:     Mouth: Mucous membranes are dry.     Comments: Appears to have some mild thrush on the tongue.  No obvious oral lesions otherwise Eyes:     Extraocular Movements: Extraocular movements intact.     Pupils: Pupils are equal, round, and reactive to light.     Comments: Seems hard for patient to fully open her eyelids but she will move her eyes in all  directions  Neck:     Comments: No meningismus Cardiovascular:     Rate and Rhythm: Normal rate and regular rhythm.     Heart sounds: Normal heart sounds.  Pulmonary:     Effort: Pulmonary effort is normal.     Breath sounds: Normal breath sounds.  Abdominal:     Palpations: Abdomen is soft.     Tenderness: There is no abdominal tenderness.  Musculoskeletal:     Cervical back: No rigidity.     Thoracic back: No tenderness.     Lumbar back: Tenderness present.  Skin:    General: Skin is warm and dry.     Comments: Small circular, mildly raised erythematous lesion on the anterior left shoulder  Neurological:     Mental Status: She is alert.     Deep Tendon Reflexes:     Reflex Scores:      Patellar reflexes are 2+ on the right side and 2+ on the left side.    Comments: Patient knows she is in the hospital.  Knows is July 2023.  Thinks it is Sunday as well as she thinks that she just saw the doctor yesterday in the hospital. Patient has equal strength in all 4 extremities though poor effort in the lower extremities/generalized weakness.  No focal weakness noted     ED Results / Procedures / Treatments   Labs (all labs ordered are listed, but only abnormal results are displayed) Labs Reviewed  COMPREHENSIVE METABOLIC PANEL - Abnormal; Notable for the following components:      Result Value   Sodium 128 (*)    Chloride 93 (*)    Creatinine, Ser 1.12 (*)    AST 69 (*)    GFR, Estimated 48 (*)    All other components within normal limits  CBC WITH DIFFERENTIAL/PLATELET - Abnormal; Notable for the following components:   WBC 3.0 (*)    HCT 35.6 (*)    Neutro Abs 1.3 (*)    All other components within normal limits  BLOOD GAS, ARTERIAL - Abnormal; Notable for the following components:   pH, Arterial 7.47 (*)    pO2, Arterial 77 (*)    All other components within normal limits  RENAL FUNCTION PANEL - Abnormal; Notable for the following components:   Sodium 130 (*)     Potassium 3.0 (*)    Calcium 8.2 (*)    Albumin 2.7 (*)    All other components within normal limits  CK - Abnormal; Notable for the following components:   Total CK 584 (*)    All other components within normal limits  I-STAT CHEM 8, ED - Abnormal; Notable for the following components:   Sodium 126 (*)  Potassium 3.4 (*)    Chloride 89 (*)    Creatinine, Ser 1.20 (*)    Calcium, Ion 1.14 (*)    All other components within normal limits  RESP PANEL BY RT-PCR (FLU A&B, COVID) ARPGX2  URINALYSIS, ROUTINE W REFLEX MICROSCOPIC  AMMONIA  MAGNESIUM  PHOSPHORUS  RENAL FUNCTION PANEL  RENAL FUNCTION PANEL  CBC  CBG MONITORING, ED  TROPONIN I (HIGH SENSITIVITY)    EKG EKG Interpretation  Date/Time:  Monday March 01 2022 07:25:40 EDT Ventricular Rate:  66 PR Interval:  236 QRS Duration: 106 QT Interval:  451 QTC Calculation: 473 R Axis:   -36 Text Interpretation: Sinus rhythm Prolonged PR interval Incomplete RBBB and LAFB Abnormal R-wave progression, early transition no significant change since February 21 2022 Confirmed by Sherwood Gambler [phone]) on 03/01/2022 7:46:29 AM  Radiology MR Brain W and Wo Contrast  Result Date: 03/01/2022 CLINICAL DATA:  Mental status change with unknown cause EXAM: MRI HEAD WITHOUT AND WITH CONTRAST TECHNIQUE: Multiplanar, multiecho pulse sequences of the brain and surrounding structures were obtained without and with intravenous contrast. CONTRAST:  33m GADAVIST GADOBUTROL 1 MMOL/ML IV SOLN COMPARISON:  Head CT from earlier today and brain MRI 03/21/2021 FINDINGS: Brain: Hypoenhancing mass in the left aspect of the pituitary gland with cavernous sinus involvement and mild lateral deflection of the cavernous carotid, unchanged when allowing for motion artifact, ~ 13 x 6 mm on coronal postcontrast imaging. Chronic small vessel ischemia and cerebral volume loss, mild for age. No acute infarct, hydrocephalus, or hemorrhage. Vascular: Major vessels are enhancing  Skull and upper cervical spine: No focal marrow lesion. Cervical spine fusion with metal artifact Sinuses/Orbits: Patchy opacification of right ethmoid and frontal air cells. Other: Intermittent motion artifact. IMPRESSION: Aging brain without acute superimposed finding. Stable left pituitary adenoma. Motion degraded. Electronically Signed   By: JJorje GuildM.D.   On: 03/01/2022 12:51   CT Head Wo Contrast  Result Date: 03/01/2022 CLINICAL DATA:  Mental status change, unknown cause.  Fall. EXAM: CT HEAD WITHOUT CONTRAST TECHNIQUE: Contiguous axial images were obtained from the base of the skull through the vertex without intravenous contrast. RADIATION DOSE REDUCTION: This exam was performed according to the departmental dose-optimization program which includes automated exposure control, adjustment of the mA and/or kV according to patient size and/or use of iterative reconstruction technique. COMPARISON:  Head CT 02/22/2022 FINDINGS: Brain: There is no evidence of an acute infarct, intracranial hemorrhage, midline shift, or extra-axial fluid collection. Mild cerebral atrophy is within normal limits for age. Patchy hypodensities in the cerebral white matter bilaterally are unchanged and nonspecific but compatible with moderate chronic small vessel ischemic disease. Asymmetric soft tissue in the left aspect of the sella extending into the left cavernous sinus corresponds to a previously treated pituitary adenoma, not evaluated in detail on this CT. Vascular: Calcified atherosclerosis at the skull base. No hyperdense vessel. Skull: No fracture or suspicious osseous lesion. Sinuses/Orbits: Chronic right sphenoid sinusitis. Clear mastoid air cells. Unremarkable orbits. Other: None. IMPRESSION: 1. No evidence of acute intracranial abnormality. 2. Moderate chronic small vessel ischemic disease. Electronically Signed   By: ALogan BoresM.D.   On: 03/01/2022 08:15   DG Chest 2 View  Result Date:  03/01/2022 CLINICAL DATA:  87year old female with history of trauma from a fall. Back pain. EXAM: CHEST - 2 VIEW COMPARISON:  Chest x-ray 02/21/2022. FINDINGS: Lung volumes are normal. No consolidative airspace disease. No pleural effusions. No pneumothorax. No pulmonary nodule or mass noted.  Pulmonary vasculature and the cardiomediastinal silhouette are within normal limits. Atherosclerosis in the thoracic aorta. Orthopedic fixation hardware in the lower cervical spine incompletely imaged. Soft tissue anchor in the right humeral head incidentally noted. IMPRESSION: 1.  No radiographic evidence of acute cardiopulmonary disease. 2. Aortic atherosclerosis. Electronically Signed   By: Vinnie Langton M.D.   On: 03/01/2022 08:02   DG Lumbar Spine Complete  Result Date: 03/01/2022 CLINICAL DATA:  87 year old female with history of trauma from a fall complaining of back pain. EXAM: LUMBAR SPINE - COMPLETE 4+ VIEW COMPARISON:  No priors. FINDINGS: Five views of the lumbar spine demonstrate no acute displaced fracture or compression type fracture. Alignment appears anatomic. Multilevel degenerative disc disease, most severe at L4-L5. Severe multilevel facet arthropathy, most pronounced at L4-L5 and L5-S1. Extensive atherosclerosis of the abdominal aorta. IMPRESSION: 1. No acute radiographic abnormality of the lumbar spine. 2. Multilevel degenerative disc disease and lumbar spondylosis, as above. Electronically Signed   By: Vinnie Langton M.D.   On: 03/01/2022 08:01    Procedures Procedures    Medications Ordered in ED Medications  0.9 %  sodium chloride infusion ( Intravenous New Bag/Given 03/01/22 1243)  sodium chloride tablet 1 g (has no administration in time range)  PreserVision AREDS 2 CAPS 1 capsule (has no administration in time range)  dicyclomine (BENTYL) tablet 20 mg (20 mg Oral Given 03/01/22 1553)  pantoprazole (PROTONIX) EC tablet 40 mg (40 mg Oral Given 03/01/22 1553)  doxycycline  (VIBRA-TABS) tablet 100 mg (100 mg Oral Given 03/01/22 1553)  heparin injection 5,000 Units (5,000 Units Subcutaneous Given 03/01/22 1553)  acetaminophen (TYLENOL) tablet 650 mg (has no administration in time range)    Or  acetaminophen (TYLENOL) suppository 650 mg (has no administration in time range)  HYDROcodone-acetaminophen (NORCO/VICODIN) 5-325 MG per tablet 1-2 tablet (has no administration in time range)  ondansetron (ZOFRAN) tablet 4 mg (has no administration in time range)    Or  ondansetron (ZOFRAN) injection 4 mg (has no administration in time range)  hydrALAZINE (APRESOLINE) injection 10 mg (has no administration in time range)  potassium chloride SA (KLOR-CON M) CR tablet 40 mEq (40 mEq Oral Given 03/01/22 1553)  lactated ringers bolus 500 mL (0 mLs Intravenous Stopped 03/01/22 0829)  gadobutrol (GADAVIST) 1 MMOL/ML injection 6 mL (6 mLs Intravenous Contrast Given 03/01/22 1225)    ED Course/ Medical Decision Making/ A&P                           Medical Decision Making Amount and/or Complexity of Data Reviewed Independent Historian:     Details: Son Labs: ordered.    Details: Sodium mildly low (128), mild AKI. Leukopenia unchanged Radiology: ordered and independent interpretation performed.    Details: CXR without pneumonia. CT head no head bleed ECG/medicine tests: ordered and independent interpretation performed.    Details: no acute ischemia  Risk Prescription drug management. Decision regarding hospitalization.   Patient with delirium. No clear cause. Mild hyponatremia probably isn't low enough for that. Doubt tick borne illness given she's on doxy and had negative titers last week. Low suspicion for CNS infection, no fever, meningismus, focal findings. At this point, will need admission given AMS. Will order MRI based on previous CNS neoplasm/treatment. Discussed with Dr. Marylyn Ishihara who will admit        Final Clinical Impression(s) / ED Diagnoses Final diagnoses:   Altered mental status, unspecified altered mental status type    Rx /  DC Orders ED Discharge Orders     None         Sherwood Gambler, MD 03/01/22 [phone]

## 2022-03-01 NOTE — ED Notes (Signed)
Patient transported to MRI 

## 2022-03-01 NOTE — Telephone Encounter (Signed)
Received order from [facility] health stating patient needs additional imaging for diag. Left breast mammo and left breast ultrasound. Order faxed to [phone]

## 2022-03-01 NOTE — ED Notes (Signed)
Pt returned from MRI °

## 2022-03-02 DIAGNOSIS — E871 Hypo-osmolality and hyponatremia: Secondary | ICD-10-CM | POA: Diagnosis not present

## 2022-03-02 LAB — RENAL FUNCTION PANEL
Albumin: 3.3 g/dL — ABNORMAL LOW (ref 3.5–5.0)
Albumin: 3.7 g/dL (ref 3.5–5.0)
Anion gap: 10 (ref 5–15)
Anion gap: 14 (ref 5–15)
BUN: 13 mg/dL (ref 8–23)
BUN: 12 mg/dL (ref 8–23)
CO2: 21 mmol/L — ABNORMAL LOW (ref 22–32)
CO2: 17 mmol/L — ABNORMAL LOW (ref 22–32)
Calcium: 8.9 mg/dL (ref 8.9–10.3)
Calcium: 9.4 mg/dL (ref 8.9–10.3)
Chloride: 100 mmol/L (ref 98–111)
Chloride: 100 mmol/L (ref 98–111)
Creatinine, Ser: 0.91 mg/dL (ref 0.44–1.00)
Creatinine, Ser: 0.92 mg/dL (ref 0.44–1.00)
GFR, Estimated: >60 mL/min (ref >60)
GFR, Estimated: >60 mL/min (ref >60)
Glucose, Bld: 86 mg/dL (ref 70–99)
Glucose, Bld: 73 mg/dL (ref 70–99)
Phosphorus: 3.7 mg/dL (ref 2.5–4.6)
Phosphorus: 3.4 mg/dL (ref 2.5–4.6)
Potassium: 5.5 mmol/L — ABNORMAL HIGH (ref 3.5–5.1)
Potassium: 5.2 mmol/L — ABNORMAL HIGH (ref 3.5–5.1)
Sodium: 131 mmol/L — ABNORMAL LOW (ref 135–145)
Sodium: 131 mmol/L — ABNORMAL LOW (ref 135–145)

## 2022-03-02 MED ORDER — ORAL CARE MOUTH RINSE: 15.0000 mL | OROMUCOSAL | Status: DC | PRN

## 2022-03-02 MED ORDER — SODIUM BICARBONATE 650 MG PO TABS
1300.0000 mg | ORAL_TABLET | Freq: Four times a day (QID) | ORAL | Status: AC
  Administered 2022-03-02 (×4): 1300 mg via ORAL
  Filled 2022-03-02 (×4): qty 2

## 2022-03-02 MED ORDER — ENSURE ENLIVE PO LIQD
237.0000 mL | Freq: Three times a day (TID) | ORAL | Status: DC
  Administered 2022-03-02 – 2022-03-04 (×7): 237 mL via ORAL

## 2022-03-02 NOTE — Progress Notes (Signed)
Initial Nutrition Assessment  DOCUMENTATION CODES:   Severe malnutrition in context of chronic illness  INTERVENTION:   -Ensure Plus High Protein po TID, each supplement provides 350 kcal and 20 grams of protein.   -Change diet to dysphagia 3 diet given reports of difficult swallowing  -Consider swallow evaluation  NUTRITION DIAGNOSIS:   Severe Malnutrition related to chronic illness (pituitary adenoma) as evidenced by mild fat depletion, severe muscle depletion, energy intake < or equal to 75% for > or equal to 1 month.  GOAL:   Patient will meet greater than or equal to 90% of their needs  MONITOR:   PO intake, Supplement acceptance, Weight trends, Labs, I & O's  REASON FOR ASSESSMENT:   Consult Assessment of nutrition requirement/status  ASSESSMENT:   87 y.o. female with medical history significant of pituitary adenoma, HTN, HLD. Presenting with generalized weakness.  Patient in room, son at bedside. Pt reports feeling a  lot of pain so currently her appetite is poor. Pain r/t a recent fall she had. States her legs hurt to move. Per son, states her appetite has been decreasing over the past month. States she doesn't eat much at meal times. Had a few deviled eggs for breakfast the other day or a bowl of honey nut cheerios. Pt had trouble swallowing pills and some meats or harder foods. Will change diet to dysphagia 3 diet. Pt has tried to drink Ensure this past week, drinking 1/2 bottle at a time. Will order for this admission as well.  Per weight records, weight has remained stable.  Medications: Bentyl, Prosight MVI  Labs reviewed: Low Na Elevated K   NUTRITION - FOCUSED PHYSICAL EXAM:  Flowsheet Row Most Recent Value  Orbital Region Mild depletion  Upper Arm Region Mild depletion  Thoracic and Lumbar Region Unable to assess  Buccal Region Mild depletion  Temple Region Mild depletion  Clavicle Bone Region Severe depletion  Clavicle and Acromion Bone Region  Severe depletion  Scapular Bone Region Severe depletion  Dorsal Hand Severe depletion  Patellar Region Unable to assess  [reports pain  when moving legs]  Anterior Thigh Region Unable to assess  Posterior Calf Region Unable to assess  Edema (RD Assessment) None  Hair Reviewed  Eyes Reviewed  Mouth Reviewed  [uses dentures]  Skin Reviewed       Diet Order:   Diet Order             Diet Heart Room service appropriate? Yes; Fluid consistency: Thin  Diet effective now                   EDUCATION NEEDS:   No education needs have been identified at this time  Skin:  Skin Assessment: Reviewed RN Assessment  Last BM:  PTA  Height:   Ht Readings from Last 1 Encounters:  03/01/22 '5\' 6"'$  (1.676 m)    Weight:   Wt Readings from Last 1 Encounters:  03/01/22 59.5 kg    BMI:  Body mass index is 21.17 kg/m.  Estimated Nutritional Needs:   Kcal:  1500-1700  Protein:  70-80g  Fluid:  1,7L/day   Clayton Bibles, MS, RD, LDN Inpatient Clinical Dietitian Contact information available via Amion

## 2022-03-02 NOTE — Progress Notes (Signed)
PROGRESS NOTE  Adriana Jordan GGY:[id number] DOB: 05-04-1935 DOA: 03/01/2022 PCP: Dixie Dials, MD  HPI/Recap of past 24 hours: Adriana Jordan is a 87 y.o. female with medical history significant of pituitary adenoma, HTN, HLD. Presenting with generalized weakness. She was recently admitted for the same. She was discharged to home about 6 days ago. History is from her son. He reports that she has been weak at home since discharge. She has been eating very little and complains of nausea. She has had 2 unwitnessed falls since her discharge. In his opinion, she seems to be getting progressively worse. When her symptoms did not improve, he decided to bring her to the hospital for evaluation.    03/02/2022: The patient was seen and examined at bedside.  She is laying in bed minimally interactive.  She has no new complaints.  Her son is present at bedside.  She thinks she is at home.  Assessment/Plan: Principal Problem:   Hyponatremia Active Problems:   Pituitary adenoma (HCC)   Hypertension   Hyperlipidemia   GERD (gastroesophageal reflux disease)   AKI (acute kidney injury) (HCC)   Hypokalemia   FTT (failure to thrive) in adult   Generalized weakness   Protein-calorie malnutrition, moderate (HCC)   Multiple falls   Tick bite  Hypovolemic hyponatremia Currently on normal saline at 75 cc/h. Repeat chemistry panel in the morning.  Hyperkalemia Serum potassium 5.2 Monitor with p.o. sodium bicarb administration 1500 mg 4 times daily x4 doses. Repeat chemistry panel in the morning  Non anion gap metabolic acidosis Serum bicarb 17 Anion gap 14 Started sodium bicarb 1300 mg 4 times daily x4 doses. Repeat chemistry panel in the morning.   Generalized weakness FTT Multiple falls Moderate protein-calorie malnutrition     - son reports that she has not been eating for some time     - dietitian consult     - PT/OT consults     - rehydrate her and encourage diet     - UA  negative, CXR negative     - ammonia negative, ABG ok     - check CK 584.     - CTH is negative; MRI brain ordered   Resolved with IV fluid, AKI     Continue gentle IV fluid hydration.   HTN     - losartan was discontinued at last admission d/t AKI; she was sent home on metoprolol only     - her HR is in the 60s; question if this is contributing to her weakness     - will hold BB for now, can control BP w/ hydralazine for now   HLD     - hold home regimen for now pending CK results   GERD     - continue home regimen    Pituitary tumor     - s/p radiation     - MRI brain ordered as part of w/u above     - continue outpt follow up   Tick bite     - recently discharge on doxy to complete 03/03/22; continue     - titres were negative    Advance Care Planning:   Code Status: FULL   Consults: None   Family Communication: Updated her son at bedside.   CODE STATUS: Full code    Status is: Inpatient The patient requires at least 2 midnights for further evaluation and treatment of present condition.    Objective: Vitals:   03/01/22 2225 03/02/22  0205 03/02/22 0700 03/02/22 1324  BP: 127/71 139/86 128/80 129/76  Pulse: 68 71 70 78  Resp: '17 17 17 18  '$ Temp: 98.4 F (36.9 C) 97.7 F (36.5 C) 98 F (36.7 C) 98 F (36.7 C)  TempSrc: Oral Oral Oral Oral  SpO2: 93% 98% 99% 94%  Weight:      Height:        Intake/Output Summary (Last 24 hours) at 03/02/2022 1500 Last data filed at 03/02/2022 0601 Gross per 24 hour  Intake 1297.41 ml  Output --  Net 1297.41 ml   Filed Weights   03/01/22 1721  Weight: 59.5 kg    Exam:  General: 87 y.o. year-old female well developed well nourished in no acute distress.  Minimally interactive Cardiovascular: Regular rate and rhythm with no rubs or gallops.  No thyromegaly or JVD noted.   Respiratory: Clear to auscultation with no wheezes or rales.  Poor inspiratory effort. Abdomen: Soft nontender nondistended with normal bowel  sounds x4 quadrants. Musculoskeletal: No lower extremity edema. 2/4 pulses in all 4 extremities. Skin: No ulcerative lesions noted or rashes, Psychiatry: Unable to assess mood due to minimal interaction.   Data Reviewed: CBC: Recent Labs  Lab 03/01/22 0725 03/01/22 0737  WBC 3.0*  --   NEUTROABS 1.3*  --   HGB 12.4 13.3  HCT 35.6* 39.0  MCV 87.5  --   PLT 369  --    Basic Metabolic Panel: Recent Labs  Lab 03/01/22 0725 03/01/22 0735 03/01/22 0737 03/01/22 1133 03/01/22 1756 03/02/22 0042 03/02/22 0600  NA 128*  --  126* 130* 131* 131* 131*  K 3.5  --  3.4* 3.0* 3.4* 5.5* 5.2*  CL 93*  --  89* 100 95* 100 100  CO2 25  --   --  22 23 21* 17*  GLUCOSE 88  --  85 83 73 86 73  BUN 17  --  '17 13 13 13 12  '$ CREATININE 1.12*  --  1.20* 0.85 0.98 0.91 0.92  CALCIUM 9.1  --   --  8.2* 9.3 8.9 9.4  MG  --  1.8  --   --   --   --   --   PHOS  --  3.8  --  3.0 3.7 3.7 3.4   GFR: Estimated Creatinine Clearance: 41.9 mL/min (by C-G formula based on SCr of 0.92 mg/dL). Liver Function Tests: Recent Labs  Lab 03/01/22 0725 03/01/22 1133 03/01/22 1756 03/02/22 0042 03/02/22 0600  AST 69*  --   --   --   --   ALT 33  --   --   --   --   ALKPHOS 68  --   --   --   --   BILITOT 0.5  --   --   --   --   PROT 7.1  --   --   --   --   ALBUMIN 3.6 2.7* 3.4* 3.3* 3.7   No results for input(s): "LIPASE", "AMYLASE" in the last 168 hours. Recent Labs  Lab 03/01/22 0725  AMMONIA <10   Coagulation Profile: No results for input(s): "INR", "PROTIME" in the last 168 hours. Cardiac Enzymes: Recent Labs  Lab 03/01/22 0735  CKTOTAL 584*   BNP (last 3 results) No results for input(s): "PROBNP" in the last 8760 hours. HbA1C: No results for input(s): "HGBA1C" in the last 72 hours. CBG: Recent Labs  Lab 03/01/22 0722  GLUCAP 79   Lipid Profile: No results  for input(s): "CHOL", "HDL", "LDLCALC", "TRIG", "CHOLHDL", "LDLDIRECT" in the last 72 hours. Thyroid Function Tests: No  results for input(s): "TSH", "T4TOTAL", "FREET4", "T3FREE", "THYROIDAB" in the last 72 hours. Anemia Panel: No results for input(s): "VITAMINB12", "FOLATE", "FERRITIN", "TIBC", "IRON", "RETICCTPCT" in the last 72 hours. Urine analysis:    Component Value Date/Time   COLORURINE YELLOW 03/01/2022 [place] 03/01/2022 0824   LABSPEC 1.009 03/01/2022 0824   PHURINE 5.0 03/01/2022 0824   GLUCOSEU NEGATIVE 03/01/2022 0824   HGBUR NEGATIVE 03/01/2022 0824   BILIRUBINUR NEGATIVE 03/01/2022 0824   KETONESUR NEGATIVE 03/01/2022 0824   PROTEINUR NEGATIVE 03/01/2022 0824   UROBILINOGEN 0.2 03/01/2015 2045   NITRITE NEGATIVE 03/01/2022 0824   LEUKOCYTESUR NEGATIVE 03/01/2022 0824   Sepsis Labs: '@LABRCNTIP'$ (procalcitonin:4,lacticidven:4)  ) Recent Results (from the past 240 hour(s))  Culture, blood (routine x 2)     Status: None   Collection Time: 02/21/22  9:20 AM   Specimen: Left Antecubital; Blood  Result Value Ref Range Status   Specimen Description   Final    LEFT ANTECUBITAL BLOOD Performed at [facility], [address] [place], [place] [address]    Special Requests   Final    Blood Culture adequate volume BOTTLES DRAWN AEROBIC AND ANAEROBIC Performed at [facility], [address] [place], [place] [address]    Culture   Final    NO GROWTH 5 DAYS Performed at [facility], [place] [address], [place], [place] [address]    Report Status 02/26/2022 FINAL  Final  Culture, blood (routine x 2)     Status: None   Collection Time: 02/21/22 11:38 AM   Specimen: BLOOD RIGHT HAND  Result Value Ref Range Status   Specimen Description   Final    BLOOD RIGHT HAND Performed at [facility], [place] [address] [place], [place] [address]    Special Requests   Final    BOTTLES DRAWN AEROBIC ONLY Blood Culture adequate volume Performed at [facility], [address] [place], [place] [address]    Culture   Final    NO GROWTH 5  DAYS Performed at [facility], [place] [address] [place], [place] [address]    Report Status 02/26/2022 FINAL  Final  Resp Panel by RT-PCR (Flu A&B, Covid) Anterior Nasal Swab     Status: None   Collection Time: 03/01/22  7:08 AM   Specimen: Anterior Nasal Swab  Result Value Ref Range Status   SARS Coronavirus 2 by RT PCR NEGATIVE NEGATIVE Final    Comment: (NOTE) SARS-CoV-2 target nucleic acids are NOT DETECTED.  The SARS-CoV-2 RNA is generally detectable in upper respiratory specimens during the acute phase of infection. The lowest concentration of SARS-CoV-2 viral copies this assay can detect is 138 copies/mL. A negative result does not preclude SARS-Cov-2 infection and should not be used as the sole basis for treatment or other patient management decisions. A negative result may occur with  improper specimen collection/handling, submission of specimen other than nasopharyngeal swab, presence of viral mutation(s) within the areas targeted by this assay, and inadequate number of viral copies(<138 copies/mL). A negative result must be combined with clinical observations, patient history, and epidemiological information. The expected result is Negative.  Fact Sheet for Patients:  EntrepreneurPulse.com.au  Fact Sheet for Healthcare Providers:  IncredibleEmployment.be  This test is no t yet approved or cleared by the [place] FDA and  has  been authorized for detection and/or diagnosis of SARS-CoV-2 by FDA under an Emergency Use Authorization (EUA). This EUA will remain  in effect (meaning this test can be used) for the duration of the COVID-19 declaration under Section 564(b)(1) of the Act, 21 U.S.C.section 360bbb-3(b)(1), unless the authorization is terminated  or revoked sooner.       Influenza A by PCR NEGATIVE NEGATIVE Final   Influenza B by PCR NEGATIVE NEGATIVE Final    Comment: (NOTE) The Xpert Xpress SARS-CoV-2/FLU/RSV  plus assay is intended as an aid in the diagnosis of influenza from Nasopharyngeal swab specimens and should not be used as a sole basis for treatment. Nasal washings and aspirates are unacceptable for Xpert Xpress SARS-CoV-2/FLU/RSV testing.  Fact Sheet for Patients: EntrepreneurPulse.com.au  Fact Sheet for Healthcare Providers: IncredibleEmployment.be  This test is not yet approved or cleared by the [place] FDA and has been authorized for detection and/or diagnosis of SARS-CoV-2 by FDA under an Emergency Use Authorization (EUA). This EUA will remain in effect (meaning this test can be used) for the duration of the COVID-19 declaration under Section 564(b)(1) of the Act, 21 U.S.C. section 360bbb-3(b)(1), unless the authorization is terminated or revoked.  Performed at [facility], [place] [address], [place], [place] [address]       Studies: No results found.  Scheduled Meds:  dicyclomine  20 mg Oral BID   doxycycline  100 mg Oral Q12H   feeding supplement  237 mL Oral TID BM   heparin  5,000 Units Subcutaneous Q8H   multivitamin  1 tablet Oral q morning   pantoprazole  40 mg Oral Daily   sodium bicarbonate  1,300 mg Oral QID   sodium chloride  1 g Oral BID WC    Continuous Infusions:  sodium chloride 75 mL/hr at 03/02/22 0216     LOS: 1 day     Kayleen Memos, MD [facility] Pager [phone]  If 7PM-7AM, please contact night-coverage [email or web address] Password [email or web address] 03/02/2022, 3:00 PM

## 2022-03-02 NOTE — H&P (Signed)
CSW started work up on Pt as many things are still pending. Pt PASRR was generated.FL2 started not signed or sent out.

## 2022-03-02 NOTE — Evaluation (Signed)
Occupational Therapy Evaluation Patient Details Name: Adriana Jordan MRN: [id number] DOB: [birth year] Today's Date: 03/02/2022   History of Present Illness Patient is a 87 year old female who presented to the ED after two falls at home and weakness since d/c from hospital about 1 week prior. patient was admitted with hyponatremia, failure to thrive, and hypokalemia.   PMH: HTN, HLD, CAD, pituitary tumor, GERD, arthritis, chronic low back pain.   Clinical Impression   Patient is a 87 year old female who was admitted for above. Patient's session was limited with onset of hallucinations of bugs and water on floor with patient unable to attend to tasks and resistive to redirection. Patient was mod A to transfer from RW to [facility] and recliner in room. Patient unable to sequence through brushing teeth with easily distracted. No family in room at this time. Patient does report living with son. Patient was noted to have decreased functional activity tolerance, decreased endurance, decreased standing balance, decreased safety awareness, and decreased knowledge of AD/AE impacting participation in ADLs.  Patient would continue to benefit from skilled OT services at this time while admitted and after d/c to address noted deficits in order to improve overall safety and independence in ADLs.        Recommendations for follow up therapy are one component of a multi-disciplinary discharge planning process, led by the attending physician.  Recommendations may be updated based on patient status, additional functional criteria and insurance authorization.   Follow Up Recommendations  Skilled nursing-short term rehab (<3 hours/day) (pending level of support at home)    Assistance Recommended at Discharge Frequent or constant Supervision/Assistance  Patient can return home with the following A little help with walking and/or transfers;Assistance with cooking/housework;A little help with  bathing/dressing/bathroom;Direct supervision/assist for financial management;Assist for transportation;Help with stairs or ramp for entrance;Direct supervision/assist for medications management    Functional Status Assessment  Patient has had a recent decline in their functional status and demonstrates the ability to make significant improvements in function in a reasonable and predictable amount of time.  Equipment Recommendations  Other (comment) (RW)    Recommendations for Other Services       Precautions / Restrictions Precautions Precautions: Fall Restrictions Weight Bearing Restrictions: No      Mobility Bed Mobility Overal bed mobility: Modified Independent                  Transfers                          Balance Overall balance assessment: Mild deficits observed, not formally tested                                         ADL either performed or assessed with clinical judgement   ADL Overall ADL's : Needs assistance/impaired Eating/Feeding: Sitting;Minimal assistance     Grooming Details (indicate cue type and reason): attempted set up to brush teeth with patient easily distracted during task and unable to sequence through with patietn easily distracted. Upper Body Bathing: Minimal assistance;Sitting   Lower Body Bathing: Sitting/lateral leans;Moderate assistance   Upper Body Dressing : Moderate assistance;Sitting   Lower Body Dressing: Moderate assistance;Sit to/from stand Lower Body Dressing Details (indicate cue type and reason): patient was able to don socks with min guard sitting EOB Toilet Transfer: Moderate assistance;Rolling walker (2 wheels) Toilet  Transfer Details (indicate cue type and reason): with +2 present for safety with patient having hallucinations and easily distracted away from task at hand Toileting- Clothing Manipulation and Hygiene: Minimal assistance;Sit to/from stand;Min guard Toileting - Clothing  Manipulation Details (indicate cue type and reason): standing at [facility] with noted posterior leaning     Functional mobility during ADLs: +2 for safety/equipment;Minimal assistance;Rolling walker (2 wheels)       Vision Baseline Vision/History: 1 Wears glasses Additional Comments: unable to assess with patient noted to have hallucinations during session     Perception     Praxis      Pertinent Vitals/Pain Pain Assessment Pain Assessment: Faces Faces Pain Scale: Hurts a little bit Pain Location: back Pain Descriptors / Indicators: Discomfort Pain Intervention(s): Monitored during session, Repositioned     Hand Dominance     Extremity/Trunk Assessment Upper Extremity Assessment Upper Extremity Assessment: Difficult to assess due to impaired cognition   Lower Extremity Assessment Lower Extremity Assessment: Defer to PT evaluation   Cervical / Trunk Assessment Cervical / Trunk Assessment: Normal   Communication Communication Communication: No difficulties   Cognition Arousal/Alertness: Awake/alert Behavior During Therapy: Flat affect, Impulsive Overall Cognitive Status: No family/caregiver present to determine baseline cognitive functioning                                 General Comments: patient was noted to have hallucinations during session seeking roaches walking around recliner and water on the floor.     General Comments       Exercises     Shoulder Instructions      Home Living Family/patient expects to be discharged to:: Private residence Living Arrangements: Children Available Help at Discharge: Family;Available 24 hours/day Type of Home: House Home Access: Stairs to enter [employer] of Steps: 3   Home Layout: One level     Bathroom Shower/Tub: Teacher, early years/pre: Standard     Home Equipment: None   Additional Comments: info taken from chart as patient had increased confusion and hallicinations during  session      Prior Functioning/Environment Prior Level of Function : Independent/Modified Independent             Mobility Comments: drives, shops          OT Problem List: Decreased activity tolerance;Impaired balance (sitting and/or standing);Decreased cognition;Decreased safety awareness;Decreased knowledge of use of DME or AE      OT Treatment/Interventions: Self-care/ADL training;Therapeutic exercise;Neuromuscular education;Energy conservation;DME and/or AE instruction;Therapeutic activities;Patient/family education;Balance training    OT Goals(Current goals can be found in the care plan section) Acute Rehab OT Goals Patient Stated Goal: none stated OT Goal Formulation: Patient unable to participate in goal setting Time For Goal Achievement: 03/16/22 Potential to Achieve Goals: Fair  OT Frequency: Min 2X/week    Co-evaluation              AM-PAC OT "6 Clicks" Daily Activity     Outcome Measure Help from another person eating meals?: A Little Help from another person taking care of personal grooming?: A Lot Help from another person toileting, which includes using toliet, bedpan, or urinal?: A Lot Help from another person bathing (including washing, rinsing, drying)?: A Lot Help from another person to put on and taking off regular upper body clothing?: A Little Help from another person to put on and taking off regular lower body clothing?: A Lot 6 Click Score: 14  End of Session Equipment Utilized During Treatment: Rolling walker (2 wheels) Nurse Communication: Other (comment) (nurse in room)  Activity Tolerance: Other (comment) (limited by hallucinations and participation in task) Patient left: in chair;with call bell/phone within reach;with chair alarm set;with nursing/sitter in room  OT Visit Diagnosis: Unsteadiness on feet (R26.81);Muscle weakness (generalized) (M62.81);History of falling (Z91.81)                Time: 0352-4818 OT Time Calculation (min):  32 min Charges:  OT General Charges $OT Visit: 1 Visit OT Evaluation $OT Eval Moderate Complexity: 1 Mod OT Treatments $Self Care/Home Management : 8-22 mins  Jackelyn Poling OTR/L, MS Acute Rehabilitation Department Office# [phone] Pager# [phone]   Marcellina Millin 03/02/2022, 8:37 AM

## 2022-03-02 NOTE — H&P (Signed)
CSW called Pt son, the son is wanting the mom to go to rehab stated that he can not care for her in this condition. Son Is wanting mom to get the help she needs to get better. CSW started FL2 needs to be signed etc.

## 2022-03-02 NOTE — Evaluation (Signed)
Physical Therapy Evaluation Patient Details Name: Adriana Jordan MRN: [id number] DOB: 1935-06-23 Today's Date: 03/02/2022  History of Present Illness  87 year old female who presented to the ED after two falls at home and weakness since d/c from hospital about 1 week prior. patient was admitted with hyponatremia, failure to thrive, and hypokalemia.   PMH: HTN, HLD, CAD, pituitary tumor, GERD, arthritis, chronic low back pain.  Clinical Impression  On eval, pt required Mod A for mobility. She stood x 2 with a RW before performing a stand pivot to get back to bed. Pt was becoming a little restless/fidgety in the recliner so assisted her back to bed. She is currently confused. No family is present during this session. Pt presents with general weakness, decreased activity tolerance, and impaired gait and balance. She is at high risk for falls when mobilizing. PT recommendation is for ST SNF rehab.        Recommendations for follow up therapy are one component of a multi-disciplinary discharge planning process, led by the attending physician.  Recommendations may be updated based on patient status, additional functional criteria and insurance authorization.  Follow Up Recommendations Skilled nursing-short term rehab (<3 hours/day) Can patient physically be transported by private vehicle: Yes    Assistance Recommended at Discharge Frequent or constant Supervision/Assistance  Patient can return home with the following  Two people to help with walking and/or transfers;A lot of help with bathing/dressing/bathroom;Assistance with cooking/housework;Assist for transportation;Direct supervision/assist for medications management;Help with stairs or ramp for entrance;Direct supervision/assist for financial management    Equipment Recommendations Rolling walker (2 wheels)  Recommendations for Other Services       Functional Status Assessment Patient has had a recent decline in their functional status and  demonstrates the ability to make significant improvements in function in a reasonable and predictable amount of time.     Precautions / Restrictions Precautions Precautions: Fall Restrictions Weight Bearing Restrictions: No      Mobility  Bed Mobility Overal bed mobility: Needs Assistance Bed Mobility: Sit to Supine       Sit to supine: Min assist   General bed mobility comments: Assist for LEs onto bed. Increased time. Cues required    Transfers Overall transfer level: Needs assistance Equipment used: Rolling walker (2 wheels) Transfers: Sit to/from Stand Sit to Stand: Mod assist Stand pivot transfers: Mod assist         General transfer comment: Sit to stand x 2. On first stand, worked on [employer], weigthshifting, heel raises. Moderate posterior bias with LOB without assistance from therapist. Multimodal cueing for safety, technique, hand placement. Stood a 2nd time and pivoted back to bed using RW. High fall risk.    Ambulation/Gait               General Gait Details: NT on today-high fall risk. She was able to perform a stand pivot with RW to get back to bed.  Stairs            Wheelchair Mobility    Modified Rankin (Stroke Patients Only)       Balance Overall balance assessment: Needs assistance, History of Falls         Standing balance support: Reliant on assistive device for balance, During functional activity, Bilateral upper extremity supported Standing balance-Leahy Scale: Poor                               Pertinent  Vitals/Pain Pain Assessment Pain Assessment: Faces Faces Pain Scale: No hurt    Home Living Family/patient expects to be discharged to:: Private residence Living Arrangements: Children Available Help at Discharge: Family;Available 24 hours/day Type of Home: House Home Access: Stairs to enter   [employer] of Steps: 3   Home Layout: One level Home Equipment: None Additional  Comments: info taken from chart as patient had increased confusion and hallicinations during session    Prior Function Prior Level of Function : Independent/Modified Independent             Mobility Comments: drives, shops       Hand Dominance        Extremity/Trunk Assessment   Upper Extremity Assessment Upper Extremity Assessment: Defer to OT evaluation    Lower Extremity Assessment Lower Extremity Assessment: Generalized weakness    Cervical / Trunk Assessment Cervical / Trunk Assessment: Normal  Communication   Communication: No difficulties  Cognition Arousal/Alertness: Awake/alert Behavior During Therapy: Flat affect Overall Cognitive Status: No family/caregiver present to determine baseline cognitive functioning Area of Impairment: Orientation, Safety/judgement, Problem solving                 Orientation Level: Disoriented to, Situation, Time, Place       Safety/Judgement: Decreased awareness of safety, Decreased awareness of deficits   Problem Solving: Slow processing, Difficulty sequencing, Requires tactile cues, Requires verbal cues          General Comments      Exercises     Assessment/Plan    PT Assessment Patient needs continued PT services  PT Problem List Decreased strength;Decreased balance;Decreased cognition;Decreased mobility;Decreased activity tolerance;Decreased knowledge of use of DME;Decreased safety awareness       PT Treatment Interventions DME instruction;Functional mobility training;Balance training;Patient/family education;Therapeutic activities;Gait training;Therapeutic exercise    PT Goals (Current goals can be found in the Care Plan section)  Acute Rehab PT Goals Patient Stated Goal: pt unable to state on today 2* confusion PT Goal Formulation: Patient unable to participate in goal setting Time For Goal Achievement: 03/16/22 Potential to Achieve Goals: Good    Frequency Min 2X/week     Co-evaluation                AM-PAC PT "6 Clicks" Mobility  Outcome Measure Help needed turning from your back to your side while in a flat bed without using bedrails?: A Little Help needed moving from lying on your back to sitting on the side of a flat bed without using bedrails?: A Little Help needed moving to and from a bed to a chair (including a wheelchair)?: A Lot Help needed standing up from a chair using your arms (e.g., wheelchair or bedside chair)?: A Lot Help needed to walk in hospital room?: Total Help needed climbing 3-5 steps with a railing? : Total 6 Click Score: 12    End of Session Equipment Utilized During Treatment: Gait belt Activity Tolerance: Patient tolerated treatment well Patient left: in bed;with call bell/phone within reach;with bed alarm set   PT Visit Diagnosis: Muscle weakness (generalized) (M62.81);History of falling (Z91.81);Difficulty in walking, not elsewhere classified (R26.2);Unsteadiness on feet (R26.81)    Time: 6294-7654 PT Time Calculation (min) (ACUTE ONLY): 16 min   Charges:   PT Evaluation $PT Eval Moderate Complexity: 1 Mod             Doreatha Massed, PT Acute Rehabilitation  Office: [phone] Pager: [phone]

## 2022-03-03 DIAGNOSIS — E871 Hypo-osmolality and hyponatremia: Secondary | ICD-10-CM | POA: Diagnosis not present

## 2022-03-03 DIAGNOSIS — E43 Unspecified severe protein-calorie malnutrition: Secondary | ICD-10-CM | POA: Insufficient documentation

## 2022-03-03 LAB — CBC
HCT: 37.3 % (ref 36.0–46.0)
Hemoglobin: 12.6 g/dL (ref 12.0–15.0)
MCH: 30.1 pg (ref 26.0–34.0)
MCHC: 33.8 g/dL (ref 30.0–36.0)
MCV: 89 fL (ref 80.0–100.0)
Platelets: 423 10*3/uL — ABNORMAL HIGH (ref 150–400)
RBC: 4.19 MIL/uL (ref 3.87–5.11)
RDW: 13.6 % (ref 11.5–15.5)
WBC: 3 10*3/uL — ABNORMAL LOW (ref 4.0–10.5)
nRBC: 0 % (ref 0.0–0.2)

## 2022-03-03 LAB — BASIC METABOLIC PANEL
Anion gap: 10 (ref 5–15)
BUN: 12 mg/dL (ref 8–23)
CO2: 24 mmol/L (ref 22–32)
Calcium: 8.7 mg/dL — ABNORMAL LOW (ref 8.9–10.3)
Chloride: 98 mmol/L (ref 98–111)
Creatinine, Ser: 0.82 mg/dL (ref 0.44–1.00)
GFR, Estimated: >60 mL/min (ref >60)
Glucose, Bld: 103 mg/dL — ABNORMAL HIGH (ref 70–99)
Potassium: 3.7 mmol/L (ref 3.5–5.1)
Sodium: 132 mmol/L — ABNORMAL LOW (ref 135–145)

## 2022-03-03 LAB — VITAMIN B12: Vitamin B-12: 1455 pg/mL — ABNORMAL HIGH (ref 180–914)

## 2022-03-03 LAB — GLUCOSE, CAPILLARY: Glucose-Capillary: 121 mg/dL — ABNORMAL HIGH (ref 70–99)

## 2022-03-03 LAB — CORTISOL: Cortisol, Plasma: 2.8 ug/dL

## 2022-03-03 LAB — VITAMIN D 25 HYDROXY (VIT D DEFICIENCY, FRACTURES): Vit D, 25-Hydroxy: 39.42 ng/mL (ref 30–100)

## 2022-03-03 MED ORDER — PHENOL 1.4 % MT LIQD: 1.0000 | OROMUCOSAL | Status: DC | PRN

## 2022-03-03 MED ORDER — HYDROCORTISONE 10 MG PO TABS
10.0000 mg | ORAL_TABLET | Freq: Every day | ORAL | Status: DC
  Administered 2022-03-03 – 2022-03-05 (×3): 10 mg via ORAL
  Filled 2022-03-03 (×3): qty 1

## 2022-03-03 MED ORDER — HYDROCORTISONE 10 MG PO TABS
20.0000 mg | ORAL_TABLET | ORAL | Status: DC
  Administered 2022-03-04 – 2022-03-05 (×2): 20 mg via ORAL
  Filled 2022-03-03 (×2): qty 2

## 2022-03-03 MED ORDER — LIP MEDEX EX OINT
1.0000 | TOPICAL_OINTMENT | CUTANEOUS | Status: DC | PRN
  Administered 2022-03-04: 1 via TOPICAL
  Filled 2022-03-03: qty 7

## 2022-03-03 MED ORDER — HYDROCORTISONE 10 MG PO TABS
5.0000 mg | ORAL_TABLET | Freq: Every day | ORAL | Status: DC
Start: 2022-03-03 — End: 2022-03-03

## 2022-03-03 MED ORDER — SODIUM ZIRCONIUM CYCLOSILICATE 5 G PO PACK
5.0000 g | PACK | Freq: Once | ORAL | Status: DC
  Filled 2022-03-03: qty 1

## 2022-03-03 MED ORDER — SODIUM BICARBONATE 650 MG PO TABS
1300.0000 mg | ORAL_TABLET | Freq: Three times a day (TID) | ORAL | Status: DC
  Administered 2022-03-03 – 2022-03-04 (×2): 1300 mg via ORAL
  Filled 2022-03-03 (×2): qty 2

## 2022-03-03 MED ORDER — SODIUM BICARBONATE 650 MG PO TABS
1300.0000 mg | ORAL_TABLET | Freq: Four times a day (QID) | ORAL | Status: DC
  Administered 2022-03-03 (×2): 1300 mg via ORAL
  Filled 2022-03-03 (×2): qty 2

## 2022-03-03 NOTE — Progress Notes (Addendum)
PROGRESS NOTE    Adriana Jordan  IOX:[id number] DOB: March 27, 1935 DOA: 03/01/2022 PCP: Dixie Dials, MD   Brief Narrative: 87 year old with past medical history significant for pituitary adenoma, hypertension, hyperlipidemia presenting with generalized weakness.  Patient was recently admitted for the same, discharged to home about 6 days prior to admission.  Patient has been weak since discharge.  With poor oral intake, nausea.  She had 2 unwitnessed fall.  Patient was found to have hyponatremia, hyperkalemia, non-anion gap metabolic acidosis.  She received IV fluids.  Started on bicarb tablets.  For generalized weakness, cortisol was obtained which was low consistent with adrenal insufficiency.  She will be a started on hydrocortisone.   Assessment & Plan:   Principal Problem:   Hyponatremia Active Problems:   Pituitary adenoma (HCC)   Hypertension   Hyperlipidemia   GERD (gastroesophageal reflux disease)   AKI (acute kidney injury) (HCC)   Hypokalemia   FTT (failure to thrive) in adult   Generalized weakness   Protein-calorie malnutrition, moderate (HCC)   Multiple falls   Tick bite   Protein-calorie malnutrition, severe  1-Hyponatremia: This could be related to adrenal insufficiency. She will be started today on hydrocortisone.  Plan to repeat B-met  tomorrow, she might need fludrocortisone.  2-Adrenal insufficiency: Patient presented with hyponatremia, hyperkalemia, non-anion gap metabolic acidosis and generalized weakness. Disorder level low at 2.8 Plan to start hydrocortisone twice daily She might need also fludrocortisone  3-Hyperkalemia: Repeat Bmet. Might need lokelma  4-no anion gap metabolic acidosis: Continue with bicarb tablets  Generalized weakness, failure to thrive, multiple falls. Suspect related to adrenal insufficiency She will need rehab MRI brain negative for acute stroke is stable pituitary adenoma UA, chest x-ray negative.  Ammonia  negative.  Hypertension: Holding beta-blocker due to weakness.  She was started on hydralazine Continue losartan due to hyperkalemia  Pituitary tumor     - s/p radiation  Recent tick bite: She was recently discharged on doxycycline, to complete antibiotics 7/26 Titers negative  AKI;  Cr peak to 1.2  Baseline cr: 0.8--0.9 Received IV fluids.   Severe Malnutrition.  In setting of chronic illness.  Clarification. Patient has severe malnutrition/.  On ensure.   Acute Metabolic encephalopathy Patient was notice to be confuse and sleepy at times.  In setting hyponatremia, dehydration/   Nutrition Problem: Severe Malnutrition Etiology: chronic illness (pituitary adenoma)    Signs/Symptoms: mild fat depletion, severe muscle depletion, energy intake < or equal to 75% for > or equal to 1 month    Interventions: Ensure Enlive (each supplement provides 350kcal and 20 grams of protein), MVI  Estimated body mass index is 21.17 kg/m as calculated from the following:   Height as of this encounter: '5\' 6"'  (1.676 m).   Weight as of this encounter: 59.5 kg.   DVT prophylaxis: Heparin  Code Status: Full code Family Communication: Son over the phone Disposition Plan:  Status is: Inpatient Remains inpatient appropriate because: management of weakness, adrenal insufficiency.     Consultants:  None  Procedures:  None  Antimicrobials:    Subjective: She is alert, feeling weak.   Objective: Vitals:   03/02/22 0205 03/02/22 0700 03/02/22 1324 03/03/22 0524  BP: 139/86 128/80 129/76 (!) 152/77  Pulse: 71 70 78 80  Resp: '17 17 18 16  ' Temp: 97.7 F (36.5 C) 98 F (36.7 C) 98 F (36.7 C) 98.1 F (36.7 C)  TempSrc: Oral Oral Oral Oral  SpO2: 98% 99% 94% 93%  Weight:  Height:        Intake/Output Summary (Last 24 hours) at 03/03/2022 1339 Last data filed at 03/02/2022 1500 Gross per 24 hour  Intake 175.18 ml  Output --  Net 175.18 ml   Filed Weights   03/01/22  1721  Weight: 59.5 kg    Examination:  General exam: Appears calm and comfortable  Respiratory system: Clear to auscultation. Respiratory effort normal. Cardiovascular system: S1 & S2 heard, RRR. No JVD, murmurs, rubs, gallops or clicks. No pedal edema. Gastrointestinal system: Abdomen is nondistended, soft and nontender. No organomegaly or masses felt. Normal bowel sounds heard. Central nervous system: Alert and oriented to person.  Extremities: Symmetric 5 x 5 power.   Data Reviewed: I have personally reviewed following labs and imaging studies  CBC: Recent Labs  Lab 03/01/22 0725 03/01/22 0737 03/03/22 0833  WBC 3.0*  --  3.0*  NEUTROABS 1.3*  --   --   HGB 12.4 13.3 12.6  HCT 35.6* 39.0 37.3  MCV 87.5  --  89.0  PLT 369  --  500*   Basic Metabolic Panel: Recent Labs  Lab 03/01/22 0725 03/01/22 0735 03/01/22 0737 03/01/22 1133 03/01/22 1756 03/02/22 0042 03/02/22 0600  NA 128*  --  126* 130* 131* 131* 131*  K 3.5  --  3.4* 3.0* 3.4* 5.5* 5.2*  CL 93*  --  89* 100 95* 100 100  CO2 25  --   --  22 23 21* 17*  GLUCOSE 88  --  85 83 73 86 73  BUN 17  --  '17 13 13 13 12  ' CREATININE 1.12*  --  1.20* 0.85 0.98 0.91 0.92  CALCIUM 9.1  --   --  8.2* 9.3 8.9 9.4  MG  --  1.8  --   --   --   --   --   PHOS  --  3.8  --  3.0 3.7 3.7 3.4   GFR: Estimated Creatinine Clearance: 41.9 mL/min (by C-G formula based on SCr of 0.92 mg/dL). Liver Function Tests: Recent Labs  Lab 03/01/22 0725 03/01/22 1133 03/01/22 1756 03/02/22 0042 03/02/22 0600  AST 69*  --   --   --   --   ALT 33  --   --   --   --   ALKPHOS 68  --   --   --   --   BILITOT 0.5  --   --   --   --   PROT 7.1  --   --   --   --   ALBUMIN 3.6 2.7* 3.4* 3.3* 3.7   No results for input(s): "LIPASE", "AMYLASE" in the last 168 hours. Recent Labs  Lab 03/01/22 0725  AMMONIA <10   Coagulation Profile: No results for input(s): "INR", "PROTIME" in the last 168 hours. Cardiac Enzymes: Recent Labs  Lab  03/01/22 0735  CKTOTAL 584*   BNP (last 3 results) No results for input(s): "PROBNP" in the last 8760 hours. HbA1C: No results for input(s): "HGBA1C" in the last 72 hours. CBG: Recent Labs  Lab 03/01/22 0722  GLUCAP 79   Lipid Profile: No results for input(s): "CHOL", "HDL", "LDLCALC", "TRIG", "CHOLHDL", "LDLDIRECT" in the last 72 hours. Thyroid Function Tests: No results for input(s): "TSH", "T4TOTAL", "FREET4", "T3FREE", "THYROIDAB" in the last 72 hours. Anemia Panel: Recent Labs    03/03/22 0833  VITAMINB12 1,455*   Sepsis Labs: No results for input(s): "PROCALCITON", "LATICACIDVEN" in the last 168 hours.  Recent  Results (from the past 240 hour(s))  Resp Panel by RT-PCR (Flu A&B, Covid) Anterior Nasal Swab     Status: None   Collection Time: 03/01/22  7:08 AM   Specimen: Anterior Nasal Swab  Result Value Ref Range Status   SARS Coronavirus 2 by RT PCR NEGATIVE NEGATIVE Final    Comment: (NOTE) SARS-CoV-2 target nucleic acids are NOT DETECTED.  The SARS-CoV-2 RNA is generally detectable in upper respiratory specimens during the acute phase of infection. The lowest concentration of SARS-CoV-2 viral copies this assay can detect is 138 copies/mL. A negative result does not preclude SARS-Cov-2 infection and should not be used as the sole basis for treatment or other patient management decisions. A negative result may occur with  improper specimen collection/handling, submission of specimen other than nasopharyngeal swab, presence of viral mutation(s) within the areas targeted by this assay, and inadequate number of viral copies(<138 copies/mL). A negative result must be combined with clinical observations, patient history, and epidemiological information. The expected result is Negative.  Fact Sheet for Patients:  EntrepreneurPulse.com.au  Fact Sheet for Healthcare Providers:  IncredibleEmployment.be  This test is no t yet  approved or cleared by the [place] FDA and  has been authorized for detection and/or diagnosis of SARS-CoV-2 by FDA under an Emergency Use Authorization (EUA). This EUA will remain  in effect (meaning this test can be used) for the duration of the COVID-19 declaration under Section 564(b)(1) of the Act, 21 U.S.C.section 360bbb-3(b)(1), unless the authorization is terminated  or revoked sooner.       Influenza A by PCR NEGATIVE NEGATIVE Final   Influenza B by PCR NEGATIVE NEGATIVE Final    Comment: (NOTE) The Xpert Xpress SARS-CoV-2/FLU/RSV plus assay is intended as an aid in the diagnosis of influenza from Nasopharyngeal swab specimens and should not be used as a sole basis for treatment. Nasal washings and aspirates are unacceptable for Xpert Xpress SARS-CoV-2/FLU/RSV testing.  Fact Sheet for Patients: EntrepreneurPulse.com.au  Fact Sheet for Healthcare Providers: IncredibleEmployment.be  This test is not yet approved or cleared by the [place] FDA and has been authorized for detection and/or diagnosis of SARS-CoV-2 by FDA under an Emergency Use Authorization (EUA). This EUA will remain in effect (meaning this test can be used) for the duration of the COVID-19 declaration under Section 564(b)(1) of the Act, 21 U.S.C. section 360bbb-3(b)(1), unless the authorization is terminated or revoked.  Performed at [facility], [place] [address] [place], [place] [address]          Radiology Studies: No results found.      Scheduled Meds:  dicyclomine  20 mg Oral BID   doxycycline  100 mg Oral Q12H   feeding supplement  237 mL Oral TID BM   heparin  5,000 Units Subcutaneous Q8H   hydrocortisone  10 mg Oral Daily   [START ON 03/04/2022] hydrocortisone  20 mg Oral BH-[id number]   multivitamin  1 tablet Oral q morning   pantoprazole  40 mg Oral Daily   sodium bicarbonate  1,300 mg Oral QID   sodium chloride  1 g Oral  BID WC   sodium zirconium cyclosilicate  5 g Oral Once   Continuous Infusions:  sodium chloride 75 mL/hr at 03/02/22 0216     LOS: 2 days    Time spent: 35 minutes.    Elmarie Shiley, MD [facility]   If 7PM-7AM, please contact night-coverage [email or web address]  03/03/2022, 1:39 PM

## 2022-03-03 NOTE — NC FL2 (Signed)
[place] LEVEL OF CARE SCREENING TOOL     IDENTIFICATION  Patient Name: Adriana Jordan Birthdate: July 17, 1935 Sex: female Admission Date (Current Location): 03/01/2022  [facility] and [place] Number:  Herbalist and Address:  [facility],  [place], [place]      Provider Number: [id number]  Attending Physician Name and Address:  Elmarie Shiley, MD  Relative Name and Phone Number:  Ardeen Fillers [phone]    Current Level of Care: Hospital Recommended Level of Care: [place] Prior Approval Number:    Date Approved/Denied:   PASRR Number: [id number]  Discharge Plan: SNF    Current Diagnoses: Patient Active Problem List   Diagnosis Date Noted   Protein-calorie malnutrition, severe 03/03/2022   Hypokalemia 03/01/2022   FTT (failure to thrive) in adult 03/01/2022   Generalized weakness 03/01/2022   Protein-calorie malnutrition, moderate ([place]) 03/01/2022   Multiple falls 03/01/2022   Tick bite 03/01/2022   Hyponatremia 02/21/2022   Chest pain, atypical 09/11/2021   Stroke ([place]) 10/11/2019   Leukopenia 11/15/2017   Acute respiratory failure with hypoxia ([place]) 11/15/2017   AKI (acute kidney injury) ([place]) 11/15/2017   Elevated CK 11/15/2017   Influenza A 11/14/2017   Hypertension 11/14/2017   Hyperlipidemia 11/14/2017   GERD (gastroesophageal reflux disease) 11/14/2017   Adenoma 05/21/2015   Pituitary tumor ([place]) 05/21/2015   Pituitary adenoma ([place]) 04/07/2015    Orientation RESPIRATION BLADDER Height & Weight     Self, Time, Situation, Place  Normal External catheter Weight: 131 lb 2.8 oz (59.5 kg) Height:  '5\' 6"'$  (167.6 cm)  BEHAVIORAL SYMPTOMS/MOOD NEUROLOGICAL BOWEL NUTRITION STATUS      Incontinent Diet (Regular)  AMBULATORY STATUS COMMUNICATION OF NEEDS Skin   Limited Assist Verbally Normal                       Personal Care Assistance Level of Assistance  Bathing,  Feeding, Dressing Bathing Assistance: Limited assistance Feeding assistance: Limited assistance Dressing Assistance: Limited assistance     Functional Limitations Info  Sight, Hearing Sight Info: Adequate Hearing Info: Adequate      SPECIAL CARE FACTORS FREQUENCY  PT (By licensed PT), OT (By licensed OT)     PT Frequency: 5x/wk OT Frequency: 5x/wk            Contractures Contractures Info: Not present    Additional Factors Info  Code Status, Allergies Code Status Info: Full Allergies Info: Codeine           Current Medications (03/03/2022):  This is the current hospital active medication list Current Facility-Administered Medications  Medication Dose Route Frequency Provider Last Rate Last Admin   0.9 %  sodium chloride infusion   Intravenous Continuous Marylyn Ishihara, Tyrone A, DO 75 mL/hr at 03/02/22 0216 New Bag at 03/02/22 0216   acetaminophen (TYLENOL) tablet 650 mg  650 mg Oral Q6H PRN Cherylann Ratel A, DO       Or   acetaminophen (TYLENOL) suppository 650 mg  650 mg Rectal Q6H PRN Marylyn Ishihara, Tyrone A, DO       dicyclomine (BENTYL) tablet 20 mg  20 mg Oral BID Marylyn Ishihara, Tyrone A, DO   20 mg at 03/02/22 2206   doxycycline (VIBRA-TABS) tablet 100 mg  100 mg Oral Q12H Kyle, Tyrone A, DO   100 mg at 03/02/22 2206   feeding supplement (ENSURE ENLIVE / ENSURE PLUS) liquid 237 mL  237 mL Oral TID BM Hall,  Lorenda Cahill, DO   237 mL at 03/02/22 2206   heparin injection 5,000 Units  5,000 Units Subcutaneous Q8H Kyle, Tyrone A, DO   5,000 Units at 03/03/22 0510   hydrALAZINE (APRESOLINE) injection 10 mg  10 mg Intravenous Q8H PRN Marylyn Ishihara, Tyrone A, DO       HYDROcodone-acetaminophen (NORCO/VICODIN) 5-325 MG per tablet 1-2 tablet  1-2 tablet Oral Q4H PRN Marylyn Ishihara, Tyrone A, DO   2 tablet at 03/02/22 1324   multivitamin (PROSIGHT) tablet 1 tablet  1 tablet Oral q Jordan Marylyn Ishihara, Tyrone A, DO   1 tablet at 03/02/22 0927   ondansetron (ZOFRAN) tablet 4 mg  4 mg Oral Q6H PRN Cherylann Ratel A, DO       Or    ondansetron (ZOFRAN) injection 4 mg  4 mg Intravenous Q6H PRN Marylyn Ishihara, Tyrone A, DO   4 mg at 03/01/22 1718   Oral care mouth rinse  15 mL Mouth Rinse PRN Irene Pap N, DO       pantoprazole (PROTONIX) EC tablet 40 mg  40 mg Oral Daily Kyle, Tyrone A, DO   40 mg at 03/02/22 2482   sodium bicarbonate tablet 1,300 mg  1,300 mg Oral QID Regalado, Belkys A, MD       sodium chloride tablet 1 g  1 g Oral BID WC Kyle, Tyrone A, DO   1 g at 03/02/22 1843   sodium zirconium cyclosilicate (LOKELMA) packet 5 g  5 g Oral Once Regalado, Belkys A, MD         Discharge Medications: Please see discharge summary for a list of discharge medications.  Relevant Imaging Results:  Relevant Lab Results:   Additional Information SSN [id number]  Vassie Moselle, LCSW

## 2022-03-03 NOTE — TOC Initial Note (Signed)
Transition of Care [facility]) - Initial/Assessment Note    Patient Details  Name: Adriana Jordan MRN: [id number] Date of Birth: 1934-08-29  Transition of Care [facility]) CM/SW Contact:    Vassie Moselle, LCSW Phone Number: 03/03/2022, 10:13 AM  Clinical Narrative:                 Pt recommended for SNF placement and son is in agreeance with this plan. Pt has been faxed out for SNF placement and currently awaiting bed offers.   Expected Discharge Plan: Skilled Nursing Facility Barriers to Discharge: Continued Medical Work up   Patient Goals and CMS Choice Patient states their goals for this hospitalization and ongoing recovery are:: To return home   Choice offered to / list presented to : Adult Children  Expected Discharge Plan and Services Expected Discharge Plan: [place] In-house Referral: Clinical Social Work Discharge Planning Services: CM Consult Post Acute Care Choice: [place] arrangements for the past 2 months: [place]                                      Prior Living Arrangements/Services Living arrangements for the past 2 months: Single Family Home Lives with:: Adult Children Patient language and need for interpreter reviewed:: Yes Do you feel safe going back to the place where you live?: Yes      Need for Family Participation in Patient Care: Yes (Comment) Care giver support system in place?: No (comment) Current home services: DME Criminal Activity/Legal Involvement Pertinent to Current Situation/Hospitalization: No - Comment as needed  Activities of Daily Living Home Assistive Devices/Equipment: Eyeglasses, Gilford Rile (specify type) ADL Screening (condition at time of admission) Patient's cognitive ability adequate to safely complete daily activities?: Yes Is the patient deaf or have difficulty hearing?: No Does the patient have difficulty seeing, even when wearing glasses/contacts?: No Does the patient  have difficulty concentrating, remembering, or making decisions?: No Patient able to express need for assistance with ADLs?: Yes Does the patient have difficulty dressing or bathing?: No Independently performs ADLs?: No Communication: Independent Dressing (OT): Independent Grooming: Independent Feeding: Independent Bathing: Independent Toileting: Needs assistance Is this a change from baseline?: Change from baseline, expected to last >3days In/Out Bed: Needs assistance Is this a change from baseline?: Change from baseline, expected to last >3 days Walks in Home: Dependent Is this a change from baseline?: Pre-admission baseline Does the patient have difficulty walking or climbing stairs?: Yes Weakness of Legs: Both Weakness of Arms/Hands: Both  Permission Sought/Granted Permission sought to share information with : Facility Sport and exercise psychologist, Family Supports Permission granted to share information with : Yes, Verbal Permission Granted  Share Information with NAME: [place] granted to share info w Relationship: Son  Permission granted to share info w Contact Information: [phone]  Emotional Assessment       Orientation: : Oriented to Self Alcohol / Substance Use: Not Applicable Psych Involvement: No (comment)  Admission diagnosis:  Hyponatremia [E87.1] Altered mental status, unspecified altered mental status type [R41.82] Patient Active Problem List   Diagnosis Date Noted   Protein-calorie malnutrition, severe 03/03/2022   Hypokalemia 03/01/2022   FTT (failure to thrive) in adult 03/01/2022   Generalized weakness 03/01/2022   Protein-calorie malnutrition, moderate ([place]) 03/01/2022   Multiple falls 03/01/2022   Tick bite 03/01/2022   Hyponatremia 02/21/2022   Chest pain, atypical 09/11/2021  Stroke ([place]) 10/11/2019   Leukopenia 11/15/2017   Acute respiratory failure with hypoxia ([place]) 11/15/2017   AKI (acute kidney injury) ([place]) 11/15/2017    Elevated CK 11/15/2017   Influenza A 11/14/2017   Hypertension 11/14/2017   Hyperlipidemia 11/14/2017   GERD (gastroesophageal reflux disease) 11/14/2017   Adenoma 05/21/2015   Pituitary tumor ([place]) 05/21/2015   Pituitary adenoma ([place]) 04/07/2015   PCP:  Dixie Dials, MD Pharmacy:   [place], [place] [address] Phone: [phone] Fax: [place] [address] [address] Phone: [phone] Fax: [phone]     Social Determinants of Health (SDOH) Interventions    Readmission Risk Interventions     No data to display

## 2022-03-04 DIAGNOSIS — E871 Hypo-osmolality and hyponatremia: Secondary | ICD-10-CM | POA: Diagnosis not present

## 2022-03-04 LAB — BASIC METABOLIC PANEL
Anion gap: 9 (ref 5–15)
BUN: 11 mg/dL (ref 8–23)
CO2: 23 mmol/L (ref 22–32)
Calcium: 8.6 mg/dL — ABNORMAL LOW (ref 8.9–10.3)
Chloride: 101 mmol/L (ref 98–111)
Creatinine, Ser: 0.89 mg/dL (ref 0.44–1.00)
GFR, Estimated: >60 mL/min (ref >60)
Glucose, Bld: 88 mg/dL (ref 70–99)
Potassium: 3.8 mmol/L (ref 3.5–5.1)
Sodium: 133 mmol/L — ABNORMAL LOW (ref 135–145)

## 2022-03-04 MED ORDER — SODIUM BICARBONATE 650 MG PO TABS
1300.0000 mg | ORAL_TABLET | Freq: Two times a day (BID) | ORAL | Status: DC
Start: 2022-03-04 — End: 2022-03-05
  Administered 2022-03-04 – 2022-03-05 (×2): 1300 mg via ORAL
  Filled 2022-03-04 (×2): qty 2

## 2022-03-04 MED ORDER — ORAL CARE MOUTH RINSE: 15.0000 mL | OROMUCOSAL | Status: DC | PRN

## 2022-03-04 NOTE — Progress Notes (Signed)
Signed             CSW called Pt son, the son is wanting the mom to go to rehab stated that he can not care for her in this condition. Son Is wanting mom to get the help she needs to get better. CSW started FL2 needs to be signed etc.

## 2022-03-04 NOTE — TOC Progression Note (Addendum)
Transition of Care [facility]) - Progression Note    Patient Details  Name: Adriana Jordan MRN: [id number] Date of Birth: 01-16-1935  Transition of Care [facility]) CM/SW Contact  Servando Snare, LCSW Phone Number: 03/04/2022, 11:43 AM  Clinical Narrative:    TOC following for dc planning. Patient and son agreeable to SNF. Patient and family chose bed at [facility]. TOC started insurance auth.  Facility is able to accept when Josem Kaufmann is received. Can admit on Saturday if needed.   12:45 PM Patient has insurance auth and bed. Can dc tomorrow is medically ready. Attending and RN notified.    Expected Discharge Plan: [place] Barriers to Discharge: Continued Medical Work up  Expected Discharge Plan and Services Expected Discharge Plan: [place] In-house Referral: Clinical Social Work Discharge Planning Services: CM Consult Post Acute Care Choice: [place] arrangements for the past 2 months: Single Family Home                                       Social Determinants of Health (SDOH) Interventions    Readmission Risk Interventions     No data to display

## 2022-03-04 NOTE — Progress Notes (Signed)
PROGRESS NOTE    Adriana Jordan  STM:[id number] DOB: [birth year] DOA: 03/01/2022 PCP: Dixie Dials, MD   Brief Narrative: 87 year old with past medical history significant for pituitary adenoma, hypertension, hyperlipidemia presenting with generalized weakness.  Patient was recently admitted for the same, discharged to home about 6 days prior to admission.  Patient has been weak since discharge.  With poor oral intake, nausea.  She had 2 unwitnessed fall.  Patient was found to have hyponatremia, hyperkalemia, non-anion gap metabolic acidosis.  She received IV fluids.  Started on bicarb tablets.  For generalized weakness, cortisol was obtained which was low consistent with adrenal insufficiency.  She was  started on hydrocortisone. Symptoms improving.    Assessment & Plan:   Principal Problem:   Hyponatremia Active Problems:   Pituitary adenoma (HCC)   Hypertension   Hyperlipidemia   GERD (gastroesophageal reflux disease)   AKI (acute kidney injury) (HCC)   Hypokalemia   FTT (failure to thrive) in adult   Generalized weakness   Protein-calorie malnutrition, moderate (HCC)   Multiple falls   Tick bite   Protein-calorie malnutrition, severe  1-Hyponatremia: This could be related to adrenal insufficiency. She will be started today on hydrocortisone.  Sodium stable.   2-Adrenal insufficiency: Patient presented with hyponatremia, hyperkalemia, non-anion gap metabolic acidosis and generalized weakness. Disorder level low at 2.8 Started on  hydrocortisone twice daily She might need also fludrocortisone, but sodium stable.  Symptoms improving. She is more alert. Feels less weak.   3-Hyperkalemia: Resolved.   4-no anion gap metabolic acidosis: Continue with bicarb tablets  Generalized weakness, failure to thrive, multiple falls. Suspect related to adrenal insufficiency She will need rehab MRI brain negative for acute stroke is stable pituitary adenoma UA, chest x-ray  negative.  Ammonia negative. Started on hydrocortisone.   Hypertension: Holding beta-blocker due to weakness.  She was started on hydralazine Continue losartan due to hyperkalemia  Pituitary tumor     - s/p radiation  Recent tick bite: She was recently discharged on doxycycline, to complete antibiotics 7/26 Titers negative  AKI;  Cr peak to 1.2  Baseline cr: 0.8--0.9 Received IV fluids.   Severe Malnutrition.  In setting of chronic illness.  Clarification. Patient has severe malnutrition/.  On ensure.   Acute Metabolic encephalopathy Patient was notice to be confuse and sleepy at times.  In setting hyponatremia, dehydration/   Nutrition Problem: Severe Malnutrition Etiology: chronic illness (pituitary adenoma)    Signs/Symptoms: mild fat depletion, severe muscle depletion, energy intake < or equal to 75% for > or equal to 1 month    Interventions: Ensure Enlive (each supplement provides 350kcal and 20 grams of protein), MVI  Estimated body mass index is 21.17 kg/m as calculated from the following:   Height as of this encounter: '5\' 6"'$  (1.676 m).   Weight as of this encounter: 59.5 kg.   DVT prophylaxis: Heparin  Code Status: Full code Family Communication: Son who was at bedside.  Disposition Plan:  Status is: Inpatient Remains inpatient appropriate because: management of weakness, adrenal insufficiency.     Consultants:  None  Procedures:  None  Antimicrobials:   Subjective She is more alert, appears less weak. Feels better.   Objective: Vitals:   03/03/22 1330 03/03/22 2249 03/04/22 0617 03/04/22 1407  BP: 137/81 136/74 123/68 120/70  Pulse: 81 81 80 77  Resp:  '16 16 16  '$ Temp: 98.8 F (37.1 C) 98.4 F (36.9 C) 98.4 F (36.9 C) 98.2 F (36.8 C)  TempSrc:  Oral Oral Oral Oral  SpO2: 92% 90% 91% 90%  Weight:      Height:       No intake or output data in the 24 hours ending 03/04/22 1443  Filed Weights   03/01/22 1721  Weight: 59.5 kg     Examination:  General exam: NAD Respiratory system: CTA Cardiovascular system: S 1, S 2 RRR Gastrointestinal system: BS present, soft, nt Central nervous system: alert, follows command.  Extremities: no edema  Data Reviewed: I have personally reviewed following labs and imaging studies  CBC: Recent Labs  Lab 03/01/22 0725 03/01/22 0737 03/03/22 0833  WBC 3.0*  --  3.0*  NEUTROABS 1.3*  --   --   HGB 12.4 13.3 12.6  HCT 35.6* 39.0 37.3  MCV 87.5  --  89.0  PLT 369  --  423*    Basic Metabolic Panel: Recent Labs  Lab 03/01/22 0735 03/01/22 0737 03/01/22 1133 03/01/22 1756 03/02/22 0042 03/02/22 0600 03/03/22 1438 03/04/22 0611  NA  --    < > 130* 131* 131* 131* 132* 133*  K  --    < > 3.0* 3.4* 5.5* 5.2* 3.7 3.8  CL  --    < > 100 95* 100 100 98 101  CO2  --   --  22 23 21* 17* 24 23  GLUCOSE  --    < > 83 73 86 73 103* 88  BUN  --    < > '13 13 13 12 12 11  '$ CREATININE  --    < > 0.85 0.98 0.91 0.92 0.82 0.89  CALCIUM  --   --  8.2* 9.3 8.9 9.4 8.7* 8.6*  MG 1.8  --   --   --   --   --   --   --   PHOS 3.8  --  3.0 3.7 3.7 3.4  --   --    < > = values in this interval not displayed.    GFR: Estimated Creatinine Clearance: 43.3 mL/min (by C-G formula based on SCr of 0.89 mg/dL). Liver Function Tests: Recent Labs  Lab 03/01/22 0725 03/01/22 1133 03/01/22 1756 03/02/22 0042 03/02/22 0600  AST 69*  --   --   --   --   ALT 33  --   --   --   --   ALKPHOS 68  --   --   --   --   BILITOT 0.5  --   --   --   --   PROT 7.1  --   --   --   --   ALBUMIN 3.6 2.7* 3.4* 3.3* 3.7    No results for input(s): "LIPASE", "AMYLASE" in the last 168 hours. Recent Labs  Lab 03/01/22 0725  AMMONIA <10    Coagulation Profile: No results for input(s): "INR", "PROTIME" in the last 168 hours. Cardiac Enzymes: Recent Labs  Lab 03/01/22 0735  CKTOTAL 584*    BNP (last 3 results) No results for input(s): "PROBNP" in the last 8760 hours. HbA1C: No results for  input(s): "HGBA1C" in the last 72 hours. CBG: Recent Labs  Lab 03/01/22 0722 03/03/22 2158  GLUCAP 79 121*    Lipid Profile: No results for input(s): "CHOL", "HDL", "LDLCALC", "TRIG", "CHOLHDL", "LDLDIRECT" in the last 72 hours. Thyroid Function Tests: No results for input(s): "TSH", "T4TOTAL", "FREET4", "T3FREE", "THYROIDAB" in the last 72 hours. Anemia Panel: Recent Labs    03/03/22 0833  VITAMINB12 1,455*  Sepsis Labs: No results for input(s): "PROCALCITON", "LATICACIDVEN" in the last 168 hours.  Recent Results (from the past 240 hour(s))  Resp Panel by RT-PCR (Flu A&B, Covid) Anterior Nasal Swab     Status: None   Collection Time: 03/01/22  7:08 AM   Specimen: Anterior Nasal Swab  Result Value Ref Range Status   SARS Coronavirus 2 by RT PCR NEGATIVE NEGATIVE Final    Comment: (NOTE) SARS-CoV-2 target nucleic acids are NOT DETECTED.  The SARS-CoV-2 RNA is generally detectable in upper respiratory specimens during the acute phase of infection. The lowest concentration of SARS-CoV-2 viral copies this assay can detect is 138 copies/mL. A negative result does not preclude SARS-Cov-2 infection and should not be used as the sole basis for treatment or other patient management decisions. A negative result may occur with  improper specimen collection/handling, submission of specimen other than nasopharyngeal swab, presence of viral mutation(s) within the areas targeted by this assay, and inadequate number of viral copies(<138 copies/mL). A negative result must be combined with clinical observations, patient history, and epidemiological information. The expected result is Negative.  Fact Sheet for Patients:  EntrepreneurPulse.com.au  Fact Sheet for Healthcare Providers:  IncredibleEmployment.be  This test is no t yet approved or cleared by the [place] FDA and  has been authorized for detection and/or diagnosis of SARS-CoV-2  by FDA under an Emergency Use Authorization (EUA). This EUA will remain  in effect (meaning this test can be used) for the duration of the COVID-19 declaration under Section 564(b)(1) of the Act, 21 U.S.C.section 360bbb-3(b)(1), unless the authorization is terminated  or revoked sooner.       Influenza A by PCR NEGATIVE NEGATIVE Final   Influenza B by PCR NEGATIVE NEGATIVE Final    Comment: (NOTE) The Xpert Xpress SARS-CoV-2/FLU/RSV plus assay is intended as an aid in the diagnosis of influenza from Nasopharyngeal swab specimens and should not be used as a sole basis for treatment. Nasal washings and aspirates are unacceptable for Xpert Xpress SARS-CoV-2/FLU/RSV testing.  Fact Sheet for Patients: EntrepreneurPulse.com.au  Fact Sheet for Healthcare Providers: IncredibleEmployment.be  This test is not yet approved or cleared by the [place] FDA and has been authorized for detection and/or diagnosis of SARS-CoV-2 by FDA under an Emergency Use Authorization (EUA). This EUA will remain in effect (meaning this test can be used) for the duration of the COVID-19 declaration under Section 564(b)(1) of the Act, 21 U.S.C. section 360bbb-3(b)(1), unless the authorization is terminated or revoked.  Performed at [facility], [place] [address], [place], [place] [address]          Radiology Studies: No results found.      Scheduled Meds:  dicyclomine  20 mg Oral BID   feeding supplement  237 mL Oral TID BM   heparin  5,000 Units Subcutaneous Q8H   hydrocortisone  10 mg Oral Daily   hydrocortisone  20 mg Oral [id number]   multivitamin  1 tablet Oral q morning   pantoprazole  40 mg Oral Daily   sodium bicarbonate  1,300 mg Oral TID   sodium chloride  1 g Oral BID WC   Continuous Infusions:  sodium chloride 75 mL/hr at 03/04/22 0644     LOS: 3 days    Time spent: 35 minutes.    Elmarie Shiley, MD [facility]   If 7PM-7AM, please contact night-coverage [email or web address]  03/04/2022, 2:43 PM

## 2022-03-05 DIAGNOSIS — E2740 Unspecified adrenocortical insufficiency: Secondary | ICD-10-CM | POA: Diagnosis not present

## 2022-03-05 MED ORDER — SODIUM BICARBONATE 650 MG PO TABS: 1300.0000 mg | ORAL_TABLET | Freq: Two times a day (BID) | ORAL | 0 refills | Status: DC

## 2022-03-05 MED ORDER — BISACODYL 10 MG RE SUPP
10.0000 mg | Freq: Once | RECTAL | Status: AC
Start: 2022-03-05 — End: 2022-03-05
  Administered 2022-03-05: 10 mg via RECTAL
  Filled 2022-03-05: qty 1

## 2022-03-05 MED ORDER — HYDROCORTISONE 10 MG PO TABS: 10.0000 mg | ORAL_TABLET | Freq: Every day | ORAL | 3 refills | Status: DC

## 2022-03-05 MED ORDER — HYDROCORTISONE 20 MG PO TABS: 20.0000 mg | ORAL_TABLET | ORAL | 3 refills | Status: DC

## 2022-03-05 NOTE — Care Management Important Message (Signed)
Important Message  Patient Details IM Letter placed in Patients room. Name: Adriana Jordan MRN: [id number] Date of Birth: 1935-04-21   Medicare Important Message Given:  Yes     Kerin Salen 03/05/2022, 9:55 AM

## 2022-03-05 NOTE — Discharge Summary (Signed)
Physician Discharge Summary   Patient: Adriana Jordan MRN: [id number] DOB: [birth year]  Admit date:     03/01/2022  Discharge date: 03/05/22  Discharge Physician: Elmarie Shiley   PCP: Dixie Dials, MD   Recommendations at discharge:   Needs rehab.  Needs referral to endocrinologist for further evaluation of adrenal insufficiency.  She was started on hydrocortisone.  Son is aware patient will need this medication.   Discharge Diagnoses: Principal Problem:   Adrenal insufficiency (HCC) Active Problems:   Pituitary adenoma (HCC)   Hypertension   Hyperlipidemia   GERD (gastroesophageal reflux disease)   AKI (acute kidney injury) (HCC)   Hyponatremia   Hypokalemia   FTT (failure to thrive) in adult   Generalized weakness   Protein-calorie malnutrition, moderate (HCC)   Multiple falls   Tick bite   Protein-calorie malnutrition, severe  Resolved Problems:   * No resolved hospital problems. *  Hospital Course: 87 year old with past medical history significant for pituitary adenoma, hypertension, hyperlipidemia presenting with generalized weakness.  Patient was recently admitted for the same, discharged to home about 6 days prior to admission.  Patient has been weak since discharge.  With poor oral intake, nausea.  She had 2 unwitnessed fall.   Patient was found to have hyponatremia, hyperkalemia, non-anion gap metabolic acidosis.  She received IV fluids.  Started on bicarb tablets.  For generalized weakness, cortisol was obtained which was low consistent with adrenal insufficiency.  She was  started on hydrocortisone. Symptoms improving.   Assessment and Plan: 1-Hyponatremia: This could be related to adrenal insufficiency. She was started on hydrocortisone.  Sodium stable.    2-Adrenal insufficiency: Patient presented with hyponatremia, hyperkalemia, non-anion gap metabolic acidosis and generalized weakness. Cortisol  level low at 2.8 Started on  hydrocortisone twice  daily She might need also fludrocortisone, but sodium stable.  Symptoms improving. She is more alert. Feels less weak.    3-Hyperkalemia: Resolved.    4-no anion gap metabolic acidosis: Continue with bicarb tablets   Generalized weakness, failure to thrive, multiple falls. Suspect related to adrenal insufficiency She will need rehab MRI brain negative for acute stroke is stable pituitary adenoma UA, chest x-ray negative.  Ammonia negative. Started on hydrocortisone.  Improved.   Hypertension: Holding beta-blocker due to weakness.  She was started on hydralazine Losartan stop due to hyperkalemia.    Pituitary tumor     - s/p radiation   Recent tick bite: She was recently discharged on doxycycline, to complete antibiotics 7/26 Titers negative   AKI;  Cr peak to 1.2  Baseline cr: 0.8--0.9 Received IV fluids.    Severe Malnutrition.  In setting of chronic illness.  Clarification. Patient has severe malnutrition/.  On ensure.    Acute Metabolic encephalopathy Patient was notice to be confuse and sleepy at times.  In setting hyponatremia, dehydration/  Improved.    Nutrition Problem: Severe Malnutrition Etiology: chronic illness (pituitary adenoma)         Consultants: None Procedures performed: None Disposition: Skilled nursing facility Diet recommendation:  Discharge Diet Orders (From admission, onward)     Start     Ordered   03/05/22 0000  Diet - low sodium heart healthy        03/05/22 1122           Regular diet DISCHARGE MEDICATION: Allergies as of 03/05/2022       Reactions   Codeine Nausea And Vomiting   Tolerates hydrocodone        Medication  List     STOP taking these medications    dicyclomine 20 MG tablet Commonly known as: BENTYL   doxycycline 100 MG tablet Commonly known as: VIBRA-TABS   losartan 50 MG tablet Commonly known as: COZAAR   mirtazapine 15 MG tablet Commonly known as: REMERON   potassium chloride 10 MEQ  tablet Commonly known as: KLOR-CON M   sodium chloride 1 g tablet       TAKE these medications    hydrocortisone 20 MG tablet Commonly known as: CORTEF Take 1 tablet (20 mg total) by mouth every morning. Start taking on: March 06, 2022   hydrocortisone 10 MG tablet Commonly known as: CORTEF Take 1 tablet (10 mg total) by mouth daily. Start taking on: March 06, 2022   metoprolol succinate 25 MG 24 hr tablet Commonly known as: TOPROL-XL Take 25 mg by mouth at bedtime.   nitroGLYCERIN 0.4 MG SL tablet Commonly known as: NITROSTAT Place 1 tablet (0.4 mg total) under the tongue every 5 (five) minutes x 3 doses as needed for chest pain.   omeprazole 20 MG capsule Commonly known as: PRILOSEC Take 20 mg by mouth daily.   pantoprazole 20 MG tablet Commonly known as: PROTONIX Take 2 tablets (40 mg total) by mouth daily for 14 days.   PreserVision AREDS 2 Caps Take 1 capsule by mouth every morning.   PREVAGEN PO Take 1 tablet by mouth every morning.   rosuvastatin 20 MG tablet Commonly known as: CRESTOR Take 20 mg by mouth at bedtime.   sodium bicarbonate 650 MG tablet Take 2 tablets (1,300 mg total) by mouth 2 (two) times daily.        Discharge Exam: Filed Weights   03/01/22 1721  Weight: 59.5 kg   General; NAD Lungs; CTA  Condition at discharge: stable  The results of significant diagnostics from this hospitalization (including imaging, microbiology, ancillary and laboratory) are listed below for reference.   Imaging Studies: MR Brain W and Wo Contrast  Result Date: 03/01/2022 CLINICAL DATA:  Mental status change with unknown cause EXAM: MRI HEAD WITHOUT AND WITH CONTRAST TECHNIQUE: Multiplanar, multiecho pulse sequences of the brain and surrounding structures were obtained without and with intravenous contrast. CONTRAST:  30m GADAVIST GADOBUTROL 1 MMOL/ML IV SOLN COMPARISON:  Head CT from earlier today and brain MRI 03/21/2021 FINDINGS: Brain: Hypoenhancing  mass in the left aspect of the pituitary gland with cavernous sinus involvement and mild lateral deflection of the cavernous carotid, unchanged when allowing for motion artifact, ~ 13 x 6 mm on coronal postcontrast imaging. Chronic small vessel ischemia and cerebral volume loss, mild for age. No acute infarct, hydrocephalus, or hemorrhage. Vascular: Major vessels are enhancing Skull and upper cervical spine: No focal marrow lesion. Cervical spine fusion with metal artifact Sinuses/Orbits: Patchy opacification of right ethmoid and frontal air cells. Other: Intermittent motion artifact. IMPRESSION: Aging brain without acute superimposed finding. Stable left pituitary adenoma. Motion degraded. Electronically Signed   By: JJorje GuildM.D.   On: 03/01/2022 12:51   CT Head Wo Contrast  Result Date: 03/01/2022 CLINICAL DATA:  Mental status change, unknown cause.  Fall. EXAM: CT HEAD WITHOUT CONTRAST TECHNIQUE: Contiguous axial images were obtained from the base of the skull through the vertex without intravenous contrast. RADIATION DOSE REDUCTION: This exam was performed according to the departmental dose-optimization program which includes automated exposure control, adjustment of the mA and/or kV according to patient size and/or use of iterative reconstruction technique. COMPARISON:  Head CT 02/22/2022 FINDINGS:  Brain: There is no evidence of an acute infarct, intracranial hemorrhage, midline shift, or extra-axial fluid collection. Mild cerebral atrophy is within normal limits for age. Patchy hypodensities in the cerebral white matter bilaterally are unchanged and nonspecific but compatible with moderate chronic small vessel ischemic disease. Asymmetric soft tissue in the left aspect of the sella extending into the left cavernous sinus corresponds to a previously treated pituitary adenoma, not evaluated in detail on this CT. Vascular: Calcified atherosclerosis at the skull base. No hyperdense vessel. Skull: No  fracture or suspicious osseous lesion. Sinuses/Orbits: Chronic right sphenoid sinusitis. Clear mastoid air cells. Unremarkable orbits. Other: None. IMPRESSION: 1. No evidence of acute intracranial abnormality. 2. Moderate chronic small vessel ischemic disease. Electronically Signed   By: Logan Bores M.D.   On: 03/01/2022 08:15   DG Chest 2 View  Result Date: 03/01/2022 CLINICAL DATA:  87 year old female with history of trauma from a fall. Back pain. EXAM: CHEST - 2 VIEW COMPARISON:  Chest x-ray 02/21/2022. FINDINGS: Lung volumes are normal. No consolidative airspace disease. No pleural effusions. No pneumothorax. No pulmonary nodule or mass noted. Pulmonary vasculature and the cardiomediastinal silhouette are within normal limits. Atherosclerosis in the thoracic aorta. Orthopedic fixation hardware in the lower cervical spine incompletely imaged. Soft tissue anchor in the right humeral head incidentally noted. IMPRESSION: 1.  No radiographic evidence of acute cardiopulmonary disease. 2. Aortic atherosclerosis. Electronically Signed   By: Vinnie Langton M.D.   On: 03/01/2022 08:02   DG Lumbar Spine Complete  Result Date: 03/01/2022 CLINICAL DATA:  87 year old female with history of trauma from a fall complaining of back pain. EXAM: LUMBAR SPINE - COMPLETE 4+ VIEW COMPARISON:  No priors. FINDINGS: Five views of the lumbar spine demonstrate no acute displaced fracture or compression type fracture. Alignment appears anatomic. Multilevel degenerative disc disease, most severe at L4-L5. Severe multilevel facet arthropathy, most pronounced at L4-L5 and L5-S1. Extensive atherosclerosis of the abdominal aorta. IMPRESSION: 1. No acute radiographic abnormality of the lumbar spine. 2. Multilevel degenerative disc disease and lumbar spondylosis, as above. Electronically Signed   By: Vinnie Langton M.D.   On: 03/01/2022 08:01   CT HEAD WO CONTRAST (5MM)  Result Date: 02/22/2022 CLINICAL DATA:  Initial evaluation  for headache. EXAM: CT HEAD WITHOUT CONTRAST TECHNIQUE: Contiguous axial images were obtained from the base of the skull through the vertex without intravenous contrast. RADIATION DOSE REDUCTION: This exam was performed according to the departmental dose-optimization program which includes automated exposure control, adjustment of the mA and/or kV according to patient size and/or use of iterative reconstruction technique. COMPARISON:  CT from 04/06/2021. FINDINGS: Brain: Generalized age-related cerebral atrophy. Patchy hypodensity involving the supratentorial cerebral white matter, most consistent with chronic small vessel ischemic disease, moderately advanced in nature. No acute intracranial hemorrhage. No acute large vessel territory infarct. No mass lesion, mass effect, or midline shift. No hydrocephalus or extra-axial fluid collection. Vascular: No hyperdense vessel. Scattered vascular calcifications noted within the carotid siphons. Skull: Scalp soft tissues and calvarium within normal limits. Sinuses/Orbits: Globes and orbital soft tissues within normal limits. Chronic right sphenoid sinusitis noted. Scattered mucosal thickening noted within the ethmoidal air cells. Mastoid air cells remain clear. Other: None. IMPRESSION: 1. No acute intracranial abnormality. 2. Generalized age-related cerebral atrophy with moderate chronic small vessel ischemic disease. 3. Chronic right sphenoid sinusitis. Electronically Signed   By: Jeannine Boga M.D.   On: 02/22/2022 01:43   DG Chest Port 1 View  Result Date: 02/21/2022 CLINICAL DATA:  87 year old female with history of weakness and emesis with headache for the past 5 days. EXAM: PORTABLE CHEST 1 VIEW COMPARISON:  Chest x-ray 09/26/2021. FINDINGS: Lung volumes are normal. No consolidative airspace disease. No pleural effusions. No pneumothorax. No pulmonary nodule or mass noted. Pulmonary vasculature and the cardiomediastinal silhouette are within normal limits.  Atherosclerosis in the thoracic aorta. Orthopedic fixation hardware in the lower cervical spine incidentally noted. IMPRESSION: 1. No radiographic evidence of acute cardiopulmonary disease. 2. Aortic atherosclerosis. Electronically Signed   By: Vinnie Langton M.D.   On: 02/21/2022 10:19    Microbiology: Results for orders placed or performed during the hospital encounter of 03/01/22  Resp Panel by RT-PCR (Flu A&B, Covid) Anterior Nasal Swab     Status: None   Collection Time: 03/01/22  7:08 AM   Specimen: Anterior Nasal Swab  Result Value Ref Range Status   SARS Coronavirus 2 by RT PCR NEGATIVE NEGATIVE Final    Comment: (NOTE) SARS-CoV-2 target nucleic acids are NOT DETECTED.  The SARS-CoV-2 RNA is generally detectable in upper respiratory specimens during the acute phase of infection. The lowest concentration of SARS-CoV-2 viral copies this assay can detect is 138 copies/mL. A negative result does not preclude SARS-Cov-2 infection and should not be used as the sole basis for treatment or other patient management decisions. A negative result may occur with  improper specimen collection/handling, submission of specimen other than nasopharyngeal swab, presence of viral mutation(s) within the areas targeted by this assay, and inadequate number of viral copies(<138 copies/mL). A negative result must be combined with clinical observations, patient history, and epidemiological information. The expected result is Negative.  Fact Sheet for Patients:  EntrepreneurPulse.com.au  Fact Sheet for Healthcare Providers:  IncredibleEmployment.be  This test is no t yet approved or cleared by the [place] FDA and  has been authorized for detection and/or diagnosis of SARS-CoV-2 by FDA under an Emergency Use Authorization (EUA). This EUA will remain  in effect (meaning this test can be used) for the duration of the COVID-19 declaration under Section 564(b)(1)  of the Act, 21 U.S.C.section 360bbb-3(b)(1), unless the authorization is terminated  or revoked sooner.       Influenza A by PCR NEGATIVE NEGATIVE Final   Influenza B by PCR NEGATIVE NEGATIVE Final    Comment: (NOTE) The Xpert Xpress SARS-CoV-2/FLU/RSV plus assay is intended as an aid in the diagnosis of influenza from Nasopharyngeal swab specimens and should not be used as a sole basis for treatment. Nasal washings and aspirates are unacceptable for Xpert Xpress SARS-CoV-2/FLU/RSV testing.  Fact Sheet for Patients: EntrepreneurPulse.com.au  Fact Sheet for Healthcare Providers: IncredibleEmployment.be  This test is not yet approved or cleared by the [place] FDA and has been authorized for detection and/or diagnosis of SARS-CoV-2 by FDA under an Emergency Use Authorization (EUA). This EUA will remain in effect (meaning this test can be used) for the duration of the COVID-19 declaration under Section 564(b)(1) of the Act, 21 U.S.C. section 360bbb-3(b)(1), unless the authorization is terminated or revoked.  Performed at [facility], [place] [address], [place], [place] [address]     Labs: CBC: Recent Labs  Lab 03/01/22 0725 03/01/22 0737 03/03/22 0833  WBC 3.0*  --  3.0*  NEUTROABS 1.3*  --   --   HGB 12.4 13.3 12.6  HCT 35.6* 39.0 37.3  MCV 87.5  --  89.0  PLT 369  --  093*   Basic Metabolic Panel: Recent Labs  Lab 03/01/22  7628 03/01/22 0737 03/01/22 1133 03/01/22 1756 03/02/22 0042 03/02/22 0600 03/03/22 1438 03/04/22 0611  NA  --    < > 130* 131* 131* 131* 132* 133*  K  --    < > 3.0* 3.4* 5.5* 5.2* 3.7 3.8  CL  --    < > 100 95* 100 100 98 101  CO2  --   --  22 23 21* 17* 24 23  GLUCOSE  --    < > 83 73 86 73 103* 88  BUN  --    < > '13 13 13 12 12 11  '$ CREATININE  --    < > 0.85 0.98 0.91 0.92 0.82 0.89  CALCIUM  --   --  8.2* 9.3 8.9 9.4 8.7* 8.6*  MG 1.8  --   --   --   --   --   --   --    PHOS 3.8  --  3.0 3.7 3.7 3.4  --   --    < > = values in this interval not displayed.   Liver Function Tests: Recent Labs  Lab 03/01/22 0725 03/01/22 1133 03/01/22 1756 03/02/22 0042 03/02/22 0600  AST 69*  --   --   --   --   ALT 33  --   --   --   --   ALKPHOS 68  --   --   --   --   BILITOT 0.5  --   --   --   --   PROT 7.1  --   --   --   --   ALBUMIN 3.6 2.7* 3.4* 3.3* 3.7   CBG: Recent Labs  Lab 03/01/22 0722 03/03/22 2158  GLUCAP 79 121*    Discharge time spent: greater than 30 minutes.  Signed: Elmarie Shiley, MD [facility] 03/05/2022

## 2022-03-05 NOTE — Progress Notes (Signed)
No IV access on initial assessment. Patient apparently pulled IV access out ambulating in the night. Dr. Tyrell Antonio instructed not to place new access as patient is supposed to discharge to SNF today.

## 2022-03-05 NOTE — Progress Notes (Signed)
Occupational Therapy Treatment Patient Details Name: Adriana Jordan MRN: [id number] DOB: [birth year] Today's Date: 03/05/2022   History of present illness 87 year old female who presented to the ED after two falls at home and weakness since d/c from hospital about 1 week prior. patient was admitted with hyponatremia, failure to thrive, and hypokalemia.   PMH: HTN, HLD, CAD, pituitary tumor, GERD, arthritis, chronic low back pain.   OT comments  Patient progressing as evidenced by an increased score on 6-Clicks AM-PAC measure of occupational Performance with previous score of 14/24, and current score of 19/24. Pt showed improved mentation since initial OT visit without sign of visual hallucination and with good attention to task. Pt mobilizing to EOB with supervision and performed 4 sit  to stands from EOB without use of Ues with Min As and 3-4 more stands from EOB with use of Ues and Min guard assist.  Pt did score 4 reps on 30 second Sit to stand Test indicating a continued high fall risk.  Patient remains limited by recent confusion/hallucinations, generalized weakness and decreased activity tolerance along with deficits noted below. Pt continues to demonstrate good rehab potential and would benefit from continued skilled OT to increase safety and independence with ADLs and functional transfers to allow pt to return home safely and reduce caregiver burden and fall risk.    Recommendations for follow up therapy are one component of a multi-disciplinary discharge planning process, led by the attending physician.  Recommendations may be updated based on patient status, additional functional criteria and insurance authorization.    Follow Up Recommendations  Skilled nursing-short term rehab (<3 hours/day)    Assistance Recommended at Discharge Frequent or constant Supervision/Assistance  Patient can return home with the following  A little help with walking and/or transfers;Assistance with  cooking/housework;A little help with bathing/dressing/bathroom;Direct supervision/assist for financial management;Assist for transportation;Help with stairs or ramp for entrance;Direct supervision/assist for medications management   Equipment Recommendations   (RW)    Recommendations for Other Services      Precautions / Restrictions Precautions Precautions: Fall Restrictions Weight Bearing Restrictions: No       Mobility Bed Mobility Overal bed mobility: Needs Assistance Bed Mobility: Supine to Sit, Sit to Supine     Supine to sit: Supervision Sit to supine: Supervision        Transfers                         Balance Overall balance assessment: Needs assistance, History of Falls   Sitting balance-Leahy Scale: Good       Standing balance-Leahy Scale: Poor                             ADL either performed or assessed with clinical judgement   ADL Overall ADL's :  (Pt reports all ADLs completed already this morning and family confirms. Pt agreeable to exercises and transfer training.)                         Toilet Transfer: Minimal assistance;Min guard Toilet Transfer Details (indicate cue type and reason): Pt stood from EOB x 4 reps without use of UEs and Min As and again with use of UEs and Min guard for 3 reps. Stopped transfer training at that point due to pt with Bilatearl knee OA and having some increased pain.  Extremity/Trunk Assessment Upper Extremity Assessment Upper Extremity Assessment: Overall WFL for tasks assessed   Lower Extremity Assessment Lower Extremity Assessment: Generalized weakness   Cervical / Trunk Assessment Cervical / Trunk Assessment: Normal    Vision Baseline Vision/History: 1 Wears glasses Ability to See in Adequate Light: 1 Impaired     Perception     Praxis      Cognition Arousal/Alertness: Awake/alert Behavior During Therapy: WFL for tasks  assessed/performed Overall Cognitive Status: Within Functional Limits for tasks assessed                   Orientation Level: Situation             General Comments: patient was noted to have hallucinations during previous OT session but not showing any signs of this. Following insructions well and oriented to all but situation.        Exercises General Exercises - Upper Extremity Shoulder Flexion: AROM, Seated, 10 reps, Both Elbow Flexion: AROM, Seated, 10 reps, Both Elbow Extension: AROM, Seated, 10 reps, Both Other Exercises Other Exercises: EOB 10 reps rows with scap retractions to BUEs. Alternating straight leg raises in supine (initiated by pt) 10 reps each    Shoulder Instructions       General Comments      Pertinent Vitals/ Pain       Pain Assessment Pain Assessment: Faces Faces Pain Scale: Hurts a little bit Pain Location: Knees after ~7 sit to stands Pain Descriptors / Indicators: Sore Pain Intervention(s): Limited activity within patient's tolerance, Monitored during session, Repositioned  Home Living                                          Prior Functioning/Environment              Frequency  Min 2X/week        Progress Toward Goals  OT Goals(current goals can now be found in the care plan section)  Progress towards OT goals: Progressing toward goals  Acute Rehab OT Goals Patient Stated Goal: To go to rehab today OT Goal Formulation: With patient/family Time For Goal Achievement: 03/16/22 Potential to Achieve Goals: Good  Plan Discharge plan remains appropriate    Co-evaluation                 AM-PAC OT "6 Clicks" Daily Activity     Outcome Measure   Help from another person eating meals?: None Help from another person taking care of personal grooming?: A Little Help from another person toileting, which includes using toliet, bedpan, or urinal?: A Little Help from another person bathing (including  washing, rinsing, drying)?: A Little Help from another person to put on and taking off regular upper body clothing?: A Little Help from another person to put on and taking off regular lower body clothing?: A Little 6 Click Score: 19    End of Session    OT Visit Diagnosis: Unsteadiness on feet (R26.81);Muscle weakness (generalized) (M62.81);History of falling (Z91.81)   Activity Tolerance Patient tolerated treatment well;Other (comment) (Asked to limit exercises so pt has energy for her anticipated transition to SNF today.)   Patient Left in bed;with call bell/phone within reach;with bed alarm set;with family/visitor present   Nurse Communication          Time: 4268-3419 OT Time Calculation (min): 16 min  Charges: OT General Charges $OT Visit:  1 Visit OT Treatments $Therapeutic Activity: 8-22 mins  Anderson Malta, OT Acute Rehab Services Office: [phone] 03/05/2022  Julien Girt 03/05/2022, 11:44 AM

## 2022-03-05 NOTE — TOC Progression Note (Signed)
Transition of Care [facility]) - Progression Note    Patient Details  Name: Adriana Jordan MRN: [id number] Date of Birth: 03-Nov-1934  Transition of Care [facility]) CM/SW Contact  Servando Snare, [place] Phone Number: 03/05/2022, 11:37 AM  Clinical Narrative:    Patient to dc to [facility] rehab in [place]. Son Frankclay notified. Patient to transport by [facility]. Room 146 RN # for report: [phone]   Expected Discharge Plan: [place] Barriers to Discharge: No Barriers Identified  Expected Discharge Plan and Services Expected Discharge Plan: [place] In-house Referral: Clinical Social Work Discharge Planning Services: CM Consult Post Acute Care Choice: [place] Living arrangements for the past 2 months: Single Family Home Expected Discharge Date: 03/05/22               DME Arranged: N/A DME Agency: NA       HH Arranged: NA HH Agency: NA         Social Determinants of Health (SDOH) Interventions    Readmission Risk Interventions     No data to display

## 2022-03-19 ENCOUNTER — Telehealth: Payer: Self-pay | Admitting: Internal Medicine

## 2022-03-19 NOTE — Telephone Encounter (Signed)
R/s pts appt with Dr. Mickeal Skinner per 8/11 staff msg from [place]. Pt is aware of new appt date/time.

## 2022-03-22 ENCOUNTER — Inpatient Hospital Stay: Payer: Medicare PPO

## 2022-03-23 ENCOUNTER — Inpatient Hospital Stay: Payer: Medicare PPO | Admitting: Internal Medicine

## 2022-03-25 ENCOUNTER — Ambulatory Visit (HOSPITAL_COMMUNITY)
Admission: RE | Admit: 2022-03-25 | Discharge: 2022-03-25 | Disposition: A | Discharge status: Home / Self Care | Payer: Medicare PPO | Source: Ambulatory Visit | Attending: Internal Medicine | Admitting: Internal Medicine

## 2022-03-25 DIAGNOSIS — D352 Benign neoplasm of pituitary gland: Secondary | ICD-10-CM | POA: Diagnosis not present

## 2022-03-25 MED ORDER — GADOBUTROL 1 MMOL/ML IV SOLN
7.5000 mL | Freq: Once | INTRAVENOUS | Status: AC | PRN
  Administered 2022-03-25: 7.5 mL via INTRAVENOUS

## 2022-03-29 ENCOUNTER — Inpatient Hospital Stay: Payer: Medicare PPO | Attending: Internal Medicine

## 2022-03-29 DIAGNOSIS — D352 Benign neoplasm of pituitary gland: Secondary | ICD-10-CM | POA: Insufficient documentation

## 2022-03-29 DIAGNOSIS — Z79899 Other long term (current) drug therapy: Secondary | ICD-10-CM | POA: Insufficient documentation

## 2022-03-29 DIAGNOSIS — Z85828 Personal history of other malignant neoplasm of skin: Secondary | ICD-10-CM | POA: Insufficient documentation

## 2022-03-29 DIAGNOSIS — Z803 Family history of malignant neoplasm of breast: Secondary | ICD-10-CM | POA: Insufficient documentation

## 2022-03-29 DIAGNOSIS — Z87891 Personal history of nicotine dependence: Secondary | ICD-10-CM | POA: Insufficient documentation

## 2022-03-29 DIAGNOSIS — I6782 Cerebral ischemia: Secondary | ICD-10-CM | POA: Insufficient documentation

## 2022-03-29 DIAGNOSIS — I252 Old myocardial infarction: Secondary | ICD-10-CM | POA: Insufficient documentation

## 2022-03-29 DIAGNOSIS — E7800 Pure hypercholesterolemia, unspecified: Secondary | ICD-10-CM | POA: Insufficient documentation

## 2022-03-29 DIAGNOSIS — K219 Gastro-esophageal reflux disease without esophagitis: Secondary | ICD-10-CM | POA: Insufficient documentation

## 2022-03-29 DIAGNOSIS — Z801 Family history of malignant neoplasm of trachea, bronchus and lung: Secondary | ICD-10-CM | POA: Insufficient documentation

## 2022-03-29 DIAGNOSIS — I10 Essential (primary) hypertension: Secondary | ICD-10-CM | POA: Insufficient documentation

## 2022-03-29 DIAGNOSIS — J322 Chronic ethmoidal sinusitis: Secondary | ICD-10-CM | POA: Insufficient documentation

## 2022-03-29 DIAGNOSIS — I2510 Atherosclerotic heart disease of native coronary artery without angina pectoris: Secondary | ICD-10-CM | POA: Insufficient documentation

## 2022-03-29 DIAGNOSIS — G8929 Other chronic pain: Secondary | ICD-10-CM | POA: Insufficient documentation

## 2022-04-01 ENCOUNTER — Other Ambulatory Visit: Payer: Self-pay

## 2022-04-01 ENCOUNTER — Inpatient Hospital Stay: Payer: Medicare PPO | Admitting: Internal Medicine

## 2022-04-01 VITALS — BP 140/81 | HR 80 | Temp 98.1°F | Resp 17 | Ht 66.0 in | Wt 125.7 lb

## 2022-04-01 DIAGNOSIS — J322 Chronic ethmoidal sinusitis: Secondary | ICD-10-CM | POA: Diagnosis not present

## 2022-04-01 DIAGNOSIS — Z87891 Personal history of nicotine dependence: Secondary | ICD-10-CM | POA: Diagnosis not present

## 2022-04-01 DIAGNOSIS — Z79899 Other long term (current) drug therapy: Secondary | ICD-10-CM | POA: Diagnosis not present

## 2022-04-01 DIAGNOSIS — E7800 Pure hypercholesterolemia, unspecified: Secondary | ICD-10-CM | POA: Diagnosis not present

## 2022-04-01 DIAGNOSIS — Z803 Family history of malignant neoplasm of breast: Secondary | ICD-10-CM | POA: Diagnosis not present

## 2022-04-01 DIAGNOSIS — Z85828 Personal history of other malignant neoplasm of skin: Secondary | ICD-10-CM | POA: Diagnosis not present

## 2022-04-01 DIAGNOSIS — I6782 Cerebral ischemia: Secondary | ICD-10-CM | POA: Diagnosis not present

## 2022-04-01 DIAGNOSIS — G8929 Other chronic pain: Secondary | ICD-10-CM | POA: Diagnosis not present

## 2022-04-01 DIAGNOSIS — K219 Gastro-esophageal reflux disease without esophagitis: Secondary | ICD-10-CM | POA: Diagnosis not present

## 2022-04-01 DIAGNOSIS — I2510 Atherosclerotic heart disease of native coronary artery without angina pectoris: Secondary | ICD-10-CM | POA: Diagnosis not present

## 2022-04-01 DIAGNOSIS — D352 Benign neoplasm of pituitary gland: Secondary | ICD-10-CM

## 2022-04-01 DIAGNOSIS — I10 Essential (primary) hypertension: Secondary | ICD-10-CM | POA: Diagnosis not present

## 2022-04-01 DIAGNOSIS — I252 Old myocardial infarction: Secondary | ICD-10-CM | POA: Diagnosis not present

## 2022-04-01 DIAGNOSIS — Z801 Family history of malignant neoplasm of trachea, bronchus and lung: Secondary | ICD-10-CM | POA: Diagnosis not present

## 2022-04-01 NOTE — Progress Notes (Signed)
[facility] [place], [place] [address] [phone]   Interval Evaluation  Date of Service: 04/01/22 Patient Name: Adriana Jordan Patient MRN: [id number] Patient DOB: 1935-01-29 Provider: Ventura Sellers, MD  Identifying Statement:  Adriana Jordan is a 87 y.o. female with  pituitary   adenoma    Oncologic History: 05/21/15: Resection by Dr. Joya Salm 07/03/18: [place] with Dr. Isidore Moos following radiographic progression  Interval History: Adriana Jordan presents to clinic today for follow up after recent MRI brain.  She did have an admission last month for altered mental status.  She was found to have low cortisol and started on hydrocortisone.  Since discharge she feels considerably improved. No headaches or visual impairment.  No recurrence of stroke symptoms.  H+P (10/11/19) Patient presents to clinic for consultation and evaluation after incidental stroke identified on routine follow up MRI for treated pituitary adenoma.  She denies any focal neurologic symptoms or any stroke-like symptoms.  No seizures or headaches.  Continues to live independently with full functional status, walks upwards of 2 miles each day for exercise.   Medications: Current Outpatient Medications on File Prior to Visit  Medication Sig Dispense Refill   Apoaequorin (PREVAGEN PO) Take 1 tablet by mouth every morning.     hydrocortisone (CORTEF) 10 MG tablet Take 1 tablet (10 mg total) by mouth daily. 30 tablet 3   hydrocortisone (CORTEF) 20 MG tablet Take 1 tablet (20 mg total) by mouth every morning. 30 tablet 3   Multiple Vitamins-Minerals (PRESERVISION AREDS 2) CAPS Take 1 capsule by mouth every morning.     omeprazole (PRILOSEC) 20 MG capsule Take 20 mg by mouth daily.     rosuvastatin (CRESTOR) 20 MG tablet Take 20 mg by mouth at bedtime.     sodium bicarbonate 650 MG tablet Take 2 tablets (1,300 mg total) by mouth 2 (two) times daily. 60 tablet 0    metoprolol succinate (TOPROL-XL) 25 MG 24 hr tablet Take 25 mg by mouth at bedtime. (Patient not taking: Reported on 02/22/2022)     nitroGLYCERIN (NITROSTAT) 0.4 MG SL tablet Place 1 tablet (0.4 mg total) under the tongue every 5 (five) minutes x 3 doses as needed for chest pain. (Patient not taking: Reported on 02/22/2022) 25 tablet 0   pantoprazole (PROTONIX) 20 MG tablet Take 2 tablets (40 mg total) by mouth daily for 14 days. (Patient not taking: Reported on 02/22/2022) 28 tablet 0   No current facility-administered medications on file prior to visit.    Allergies:  Allergies  Allergen Reactions   Codeine Nausea And Vomiting    Tolerates hydrocodone   Past Medical History:  Past Medical History:  Diagnosis Date   Arthritis    "all over" (11/14/2017)   Brain tumor ([place])    "I still have it"; not cancer (11/14/2017)   Chronic lower back pain    Coronary artery disease    Dr. Doylene Canard   GERD (gastroesophageal reflux disease)    History of blood transfusion    "don't remember why" (11/14/2017)   History of radiation therapy 07/03/2018   brain, pituitary/ 12.5 Gy in 1 fraction   Hypercholesteremia    Hypertension    MI (myocardial infarction) ([place]) 2006   PONV (postoperative nausea and vomiting)    Skin cancer    "right forearm"   Past Surgical History:  Past Surgical History:  Procedure Laterality Date   ANTERIOR CERVICAL DECOMP/DISCECTOMY FUSION  BACK SURGERY     CARPAL TUNNEL RELEASE Bilateral    CORONARY ANGIOPLASTY     DES mid LAD and mid RCA 09/21/04   CRANIOTOMY N/A 05/21/2015   Procedure: Transsphenoidal resection of pituitary tumor with Dr. Radene Journey for approach;  Surgeon: Leeroy Cha, MD;  Location: [facility] NEURO ORS;  Service: Neurosurgery;  Laterality: N/A;  Transsphenoidal resection of pituitary tumor with Dr. Radene Journey for approach   SHOULDER OPEN ROTATOR CUFF REPAIR Bilateral    SKIN CANCER EXCISION Right    forearm   thumb surgery Right    placed srews to  make straight   THYROID SURGERY     "goiter removed"   Social History:  Social History   Socioeconomic History   Marital status: Divorced    Spouse name: Not on file   Number of children: Not on file   Years of education: Not on file   Highest education level: Not on file  Occupational History   Not on file  Tobacco Use   Smoking status: Former    Packs/day: 0.12    Years: 5.00    Total pack years: 0.60    Types: Cigarettes    Quit date: 04/26/1969    Years since quitting: 52.9   Smokeless tobacco: Never  Vaping Use   Vaping Use: Never used  Substance and Sexual Activity   Alcohol use: Not Currently   Drug use: No   Sexual activity: Not Currently    Birth control/protection: Post-menopausal    Comment: 1ST intercourse- 19, partners- 52,   Other Topics Concern   Not on file  Social History Narrative   Not on file   Social Determinants of Health   Financial Resource Strain: Not on file  Food Insecurity: Not on file  Transportation Needs: No Transportation Needs (06/20/2018)   PRAPARE - Hydrologist (Medical): No    Lack of Transportation (Non-Medical): No  Physical Activity: Not on file  Stress: Not on file  Social Connections: Not on file  Intimate Partner Violence: Not At Risk (06/20/2018)   Humiliation, Afraid, Rape, and Kick questionnaire    Fear of Current or Ex-Partner: No    Emotionally Abused: No    Physically Abused: No    Sexually Abused: No   Family History:  Family History  Problem Relation Age of Onset   Cancer Father 70       throat- smoker    Breast cancer Sister 83   Cancer Brother 35       lung- smoker    Review of Systems: Constitutional: Doesn't report fevers, chills or abnormal weight loss Eyes: Doesn't report blurriness of vision Ears, nose, mouth, throat, and face: Doesn't report sore throat Respiratory: Doesn't report cough, dyspnea or wheezes Cardiovascular: Doesn't report palpitation, chest  discomfort  Gastrointestinal:  Doesn't report nausea, constipation, diarrhea GU: Doesn't report incontinence Skin: Doesn't report skin rashes Neurological: Per HPI Musculoskeletal: Doesn't report joint pain Behavioral/Psych: Doesn't report anxiety  Physical Exam: Vitals:   04/01/22 1106  BP: (!) 140/81  Pulse: 80  Resp: 17  Temp: 98.1 F (36.7 C)  SpO2: 97%   KPS: 90. General: Alert, cooperative, pleasant, in no acute distress Head: Normal EENT: No conjunctival injection or scleral icterus.  Lungs: Resp effort normal Cardiac: Regular rate Abdomen: Non-distended abdomen Skin: No rashes cyanosis or petechiae. Extremities: No clubbing or edema  Neurologic Exam: Mental Status: Awake, alert, attentive to examiner. Oriented to self and environment. Language is  fluent with intact comprehension.  Cranial Nerves: Visual acuity is grossly normal. Visual fields are full. Extra-ocular movements intact. No ptosis. Face is symmetric Motor: Tone and bulk are normal. Power is full in both arms and legs. Reflexes are symmetric, no pathologic reflexes present.  Sensory: Intact to light touch Gait: Normal.   Labs: I have reviewed the data as listed    Component Value Date/Time   NA 133 (L) 03/04/2022 0611   K 3.8 03/04/2022 0611   CL 101 03/04/2022 0611   CO2 23 03/04/2022 0611   GLUCOSE 88 03/04/2022 0611   BUN 11 03/04/2022 0611   CREATININE 0.89 03/04/2022 0611   CALCIUM 8.6 (L) 03/04/2022 0611   PROT 7.1 03/01/2022 0725   ALBUMIN 3.7 03/02/2022 0600   AST 69 (H) 03/01/2022 0725   ALT 33 03/01/2022 0725   ALKPHOS 68 03/01/2022 0725   BILITOT 0.5 03/01/2022 0725   GFRNONAA >60 03/04/2022 0611   GFRAA 46 (L) 11/15/2017 0439   Lab Results  Component Value Date   WBC 3.0 (L) 03/03/2022   NEUTROABS 1.3 (L) 03/01/2022   HGB 12.6 03/03/2022   HCT 37.3 03/03/2022   MCV 89.0 03/03/2022   PLT 423 (H) 03/03/2022    Imaging:  [facility] Clinician Interpretation: I have personally  reviewed the CNS images as listed.  My interpretation, in the context of the patient's clinical presentation, is stable disease  MR BRAIN W WO CONTRAST  Result Date: 03/27/2022 CLINICAL DATA:  History of pituitary tumor with resection in 2016 and SRS in 2019 EXAM: MRI HEAD WITHOUT AND WITH CONTRAST TECHNIQUE: Multiplanar, multiecho pulse sequences of the brain and surrounding structures were obtained without and with intravenous contrast. CONTRAST:  7.5m GADAVIST GADOBUTROL 1 MMOL/ML IV SOLN COMPARISON:  03/21/2021. A 03/01/2022 brain MRI is motion degraded and not optimized for the pituitary gland. FINDINGS: Brain: Heterogeneous tissue in the sella, indistinguishable from a normal pituitary gland, with left eccentric masslike features reaching the lateral inter carotid line just below the left oculomotor nerve. Overall dimensions are unchanged with an 8 mm craniocaudal and 13 mm anterior to posterior span at the thickest portion which is left para median. Pituitary stalk remains deviated to the right without thickening. No change in the characteristics of the soft tissue and no suprasellar involvement. Chronic small vessel ischemia in the cerebral white matter. Generalized brain atrophy with small chronic cerebellar infarcts. No gross progression. No hemorrhage, hydrocephalus, or collection Vascular: Major flow voids and vascular enhancements are preserved Skull and upper cervical spine: Normal marrow signal. Sinuses/Orbits: Chronic right posterior ethmoid sinusitis with central inspissated material. IMPRESSION: Stable left eccentric pituitary adenoma with left cavernous sinus involvement. Chronic small vessel ischemia. Electronically Signed   By: JJorje GuildM.D.   On: 03/27/2022 16:53    Assessment/Plan Pituitary adenoma ([facility] [D35.2]  Stroke  Adriana METZNERis clinically and radiographically stable today.  Mental status, vision have improved since starting the hydocortisone.  She will follow up  with endocrinology.  Recommend continuing ASA at 325 daily and Crestor '20mg'$  daily for secondary stroke prevention.    We ask that HPHEONIX WISBYreturn to clinic in 18 months following next brain MRI, or sooner as needed.    All questions were answered. The patient knows to call the clinic with any problems, questions or concerns. No barriers to learning were detected.  The total time spent in the encounter was 30 minutes and more than 50% was on counseling and review of test  results   Ventura Sellers, MD Medical Director of Neuro-Oncology [facility] 04/01/22 11:27 AM

## 2022-06-21 ENCOUNTER — Emergency Department (HOSPITAL_COMMUNITY): Payer: Medicare PPO

## 2022-06-21 ENCOUNTER — Other Ambulatory Visit: Payer: Self-pay

## 2022-06-21 ENCOUNTER — Encounter (HOSPITAL_COMMUNITY): Payer: Self-pay | Admitting: Emergency Medicine

## 2022-06-21 ENCOUNTER — Observation Stay (HOSPITAL_COMMUNITY)
Admission: EM | Admit: 2022-06-21 | Discharge: 2022-06-23 | Disposition: A | Discharge status: Home Health | Payer: Medicare PPO | Attending: Internal Medicine | Admitting: Internal Medicine

## 2022-06-21 ENCOUNTER — Observation Stay (HOSPITAL_COMMUNITY): Payer: Medicare PPO

## 2022-06-21 DIAGNOSIS — I2510 Atherosclerotic heart disease of native coronary artery without angina pectoris: Secondary | ICD-10-CM | POA: Diagnosis not present

## 2022-06-21 DIAGNOSIS — M6281 Muscle weakness (generalized): Secondary | ICD-10-CM | POA: Insufficient documentation

## 2022-06-21 DIAGNOSIS — E871 Hypo-osmolality and hyponatremia: Secondary | ICD-10-CM | POA: Diagnosis not present

## 2022-06-21 DIAGNOSIS — B349 Viral infection, unspecified: Secondary | ICD-10-CM | POA: Diagnosis not present

## 2022-06-21 DIAGNOSIS — N179 Acute kidney failure, unspecified: Secondary | ICD-10-CM | POA: Diagnosis not present

## 2022-06-21 DIAGNOSIS — R748 Abnormal levels of other serum enzymes: Secondary | ICD-10-CM | POA: Diagnosis present

## 2022-06-21 DIAGNOSIS — R252 Cramp and spasm: Secondary | ICD-10-CM | POA: Diagnosis not present

## 2022-06-21 DIAGNOSIS — Z79899 Other long term (current) drug therapy: Secondary | ICD-10-CM | POA: Insufficient documentation

## 2022-06-21 DIAGNOSIS — R0602 Shortness of breath: Secondary | ICD-10-CM | POA: Insufficient documentation

## 2022-06-21 DIAGNOSIS — Z85828 Personal history of other malignant neoplasm of skin: Secondary | ICD-10-CM | POA: Diagnosis not present

## 2022-06-21 DIAGNOSIS — Z1152 Encounter for screening for COVID-19: Secondary | ICD-10-CM | POA: Insufficient documentation

## 2022-06-21 DIAGNOSIS — Z87891 Personal history of nicotine dependence: Secondary | ICD-10-CM | POA: Diagnosis not present

## 2022-06-21 DIAGNOSIS — Z955 Presence of coronary angioplasty implant and graft: Secondary | ICD-10-CM | POA: Diagnosis not present

## 2022-06-21 DIAGNOSIS — E872 Acidosis, unspecified: Secondary | ICD-10-CM | POA: Diagnosis not present

## 2022-06-21 DIAGNOSIS — I10 Essential (primary) hypertension: Secondary | ICD-10-CM | POA: Diagnosis not present

## 2022-06-21 DIAGNOSIS — R531 Weakness: Secondary | ICD-10-CM | POA: Diagnosis present

## 2022-06-21 DIAGNOSIS — R739 Hyperglycemia, unspecified: Secondary | ICD-10-CM | POA: Insufficient documentation

## 2022-06-21 DIAGNOSIS — D352 Benign neoplasm of pituitary gland: Secondary | ICD-10-CM | POA: Diagnosis present

## 2022-06-21 DIAGNOSIS — R519 Headache, unspecified: Secondary | ICD-10-CM | POA: Diagnosis not present

## 2022-06-21 LAB — COMPREHENSIVE METABOLIC PANEL
ALT: 25 U/L (ref 0–44)
AST: 47 U/L — ABNORMAL HIGH (ref 15–41)
Albumin: 4.1 g/dL (ref 3.5–5.0)
Alkaline Phosphatase: 81 U/L (ref 38–126)
Anion gap: 22 — ABNORMAL HIGH (ref 5–15)
BUN: 14 mg/dL (ref 8–23)
CO2: 17 mmol/L — ABNORMAL LOW (ref 22–32)
Calcium: 9.7 mg/dL (ref 8.9–10.3)
Chloride: 94 mmol/L — ABNORMAL LOW (ref 98–111)
Creatinine, Ser: 1.01 mg/dL — ABNORMAL HIGH (ref 0.44–1.00)
GFR, Estimated: 55 mL/min — ABNORMAL LOW (ref >60)
Glucose, Bld: 76 mg/dL (ref 70–99)
Potassium: 3.5 mmol/L (ref 3.5–5.1)
Sodium: 133 mmol/L — ABNORMAL LOW (ref 135–145)
Total Bilirubin: 0.8 mg/dL (ref 0.3–1.2)
Total Protein: 8.2 g/dL — ABNORMAL HIGH (ref 6.5–8.1)

## 2022-06-21 LAB — SARS CORONAVIRUS 2 BY RT PCR: SARS Coronavirus 2 by RT PCR: NEGATIVE

## 2022-06-21 LAB — RESP PANEL BY RT-PCR (FLU A&B, COVID) ARPGX2
Influenza A by PCR: NEGATIVE
Influenza B by PCR: NEGATIVE
SARS Coronavirus 2 by RT PCR: NEGATIVE

## 2022-06-21 LAB — I-STAT VENOUS BLOOD GAS, ED
Acid-base deficit: 4 mmol/L — ABNORMAL HIGH (ref 0.0–2.0)
Bicarbonate: 20.4 mmol/L (ref 20.0–28.0)
Calcium, Ion: 0.95 mmol/L — ABNORMAL LOW (ref 1.15–1.40)
HCT: 46 % (ref 36.0–46.0)
Hemoglobin: 15.6 g/dL — ABNORMAL HIGH (ref 12.0–15.0)
O2 Saturation: 100 %
Potassium: 4.3 mmol/L (ref 3.5–5.1)
Sodium: 126 mmol/L — ABNORMAL LOW (ref 135–145)
TCO2: 21 mmol/L — ABNORMAL LOW (ref 22–32)
pCO2, Ven: 36.3 mmHg — ABNORMAL LOW (ref 44–60)
pH, Ven: 7.357 (ref 7.25–7.43)
pO2, Ven: 231 mmHg — ABNORMAL HIGH (ref 32–45)

## 2022-06-21 LAB — CBC WITH DIFFERENTIAL/PLATELET
Abs Immature Granulocytes: 0 10*3/uL (ref 0.00–0.07)
Basophils Absolute: 0 10*3/uL (ref 0.0–0.1)
Basophils Relative: 0 %
Eosinophils Absolute: 0.2 10*3/uL (ref 0.0–0.5)
Eosinophils Relative: 9 %
HCT: 46.7 % — ABNORMAL HIGH (ref 36.0–46.0)
Hemoglobin: 15.6 g/dL — ABNORMAL HIGH (ref 12.0–15.0)
Lymphocytes Relative: 41 %
Lymphs Abs: 1 10*3/uL (ref 0.7–4.0)
MCH: 32.1 pg (ref 26.0–34.0)
MCHC: 33.4 g/dL (ref 30.0–36.0)
MCV: 96.1 fL (ref 80.0–100.0)
Monocytes Absolute: 0.2 10*3/uL (ref 0.1–1.0)
Monocytes Relative: 9 %
Neutro Abs: 1 10*3/uL — ABNORMAL LOW (ref 1.7–7.7)
Neutrophils Relative %: 41 %
Platelets: 293 10*3/uL (ref 150–400)
RBC: 4.86 MIL/uL (ref 3.87–5.11)
RDW: 14.7 % (ref 11.5–15.5)
WBC: 2.4 10*3/uL — ABNORMAL LOW (ref 4.0–10.5)
nRBC: 0 % (ref 0.0–0.2)
nRBC: 0 /100 WBC

## 2022-06-21 LAB — CK: Total CK: 502 U/L — ABNORMAL HIGH (ref 38–234)

## 2022-06-21 LAB — TSH: TSH: 3.088 u[IU]/mL (ref 0.350–4.500)

## 2022-06-21 LAB — CORTISOL: Cortisol, Plasma: 6.8 ug/dL

## 2022-06-21 MED ORDER — OXYCODONE HCL 5 MG PO TABS
5.0000 mg | ORAL_TABLET | ORAL | Status: DC | PRN
  Administered 2022-06-21: 5 mg via ORAL
  Filled 2022-06-21: qty 1

## 2022-06-21 MED ORDER — SODIUM CHLORIDE 0.9 % IV BOLUS
1000.0000 mL | Freq: Once | INTRAVENOUS | Status: AC
Start: 2022-06-21 — End: 2022-06-21
  Administered 2022-06-21: 1000 mL via INTRAVENOUS

## 2022-06-21 MED ORDER — DEXTROSE 5 % IV SOLN
10.0000 mg/kg | Freq: Two times a day (BID) | INTRAVENOUS | Status: DC
  Filled 2022-06-21: qty 13.7

## 2022-06-21 MED ORDER — ONDANSETRON HCL 4 MG PO TABS: 4.0000 mg | ORAL_TABLET | Freq: Four times a day (QID) | ORAL | Status: DC | PRN

## 2022-06-21 MED ORDER — KETOROLAC TROMETHAMINE 15 MG/ML IJ SOLN
15.0000 mg | Freq: Once | INTRAMUSCULAR | Status: AC
  Administered 2022-06-21: 15 mg via INTRAVENOUS
  Filled 2022-06-21: qty 1

## 2022-06-21 MED ORDER — DEXAMETHASONE SODIUM PHOSPHATE 10 MG/ML IJ SOLN
10.0000 mg | Freq: Once | INTRAMUSCULAR | Status: AC
  Administered 2022-06-21: 10 mg via INTRAVENOUS
  Filled 2022-06-21: qty 1

## 2022-06-21 MED ORDER — ACETAMINOPHEN 325 MG PO TABS: 650.0000 mg | ORAL_TABLET | Freq: Four times a day (QID) | ORAL | Status: DC | PRN

## 2022-06-21 MED ORDER — DEXTROSE 5 % IV SOLN
10.0000 mg/kg | Freq: Once | INTRAVENOUS | Status: AC
  Administered 2022-06-21: 570 mg via INTRAVENOUS
  Filled 2022-06-21: qty 11.4

## 2022-06-21 MED ORDER — HYDROCORTISONE 10 MG PO TABS
10.0000 mg | ORAL_TABLET | Freq: Every evening | ORAL | Status: DC
  Administered 2022-06-21 – 2022-06-22 (×2): 10 mg via ORAL
  Filled 2022-06-21 (×4): qty 1

## 2022-06-21 MED ORDER — HYDROMORPHONE HCL 1 MG/ML IJ SOLN: 0.5000 mg | INTRAMUSCULAR | Status: DC | PRN

## 2022-06-21 MED ORDER — METOCLOPRAMIDE HCL 5 MG/ML IJ SOLN
10.0000 mg | Freq: Once | INTRAMUSCULAR | Status: AC
  Administered 2022-06-21: 10 mg via INTRAVENOUS
  Filled 2022-06-21: qty 2

## 2022-06-21 MED ORDER — LACTATED RINGERS IV SOLN: INTRAVENOUS | Status: DC

## 2022-06-21 MED ORDER — HYDROCORTISONE 20 MG PO TABS
20.0000 mg | ORAL_TABLET | ORAL | Status: DC
  Administered 2022-06-22 – 2022-06-23 (×2): 20 mg via ORAL
  Filled 2022-06-21 (×2): qty 1

## 2022-06-21 MED ORDER — ONDANSETRON HCL 4 MG/2ML IJ SOLN: 4.0000 mg | Freq: Four times a day (QID) | INTRAMUSCULAR | Status: DC | PRN

## 2022-06-21 MED ORDER — ACETAMINOPHEN 650 MG RE SUPP: 650.0000 mg | Freq: Four times a day (QID) | RECTAL | Status: DC | PRN

## 2022-06-21 MED ORDER — POTASSIUM CHLORIDE IN NACL 20-0.9 MEQ/L-% IV SOLN: INTRAVENOUS | Status: DC

## 2022-06-21 NOTE — ED Triage Notes (Signed)
Pt from home via EMS with generalized weakness, headache, and malaise x 4 days. Decreased PO intake, endorses nausea without vomiting.

## 2022-06-21 NOTE — Assessment & Plan Note (Signed)
-   Continue IV fluids overnight - Check BMP tomorrow

## 2022-06-21 NOTE — Assessment & Plan Note (Signed)
Dizziness Symptom onset was acute over 3-4 days.  The constellation of new severe acute generalized weakness and now severe headache suggests viral syndrome, possibly aseptic meningitis (note HSV-like rash on right lip).  Pituitary adenoma raises possibility of severe hypothyroidism although time frame doesn't fit.  Stroke or intracranial hemorrhage ruled out.   - Rule out flu - Empiric acyclovir given HSV like rash on lip and severe headache - If flu positive, will stop acyclovir and start Tamiflu - If flu negative, will obtain LP

## 2022-06-21 NOTE — Assessment & Plan Note (Addendum)
Is post to have outpatient endocrinology follow-up, has not done this yet  Does not appear to be in adrenal crisis at this time - Continue home hydrocortisone

## 2022-06-21 NOTE — Assessment & Plan Note (Signed)
BP normalized, not on meds at home

## 2022-06-21 NOTE — ED Provider Triage Note (Signed)
Emergency Medicine Provider Triage Evaluation Note  Adriana Jordan , a 87 y.o. female  was evaluated in triage.  Pt complains of weakness, nausea  Review of Systems  Positive: nausea Negative: No diarrhea  Physical Exam  LMP  (LMP Unknown)  Gen:   Awake, weak appearing  Resp:  Normal effort  MSK:   Moves extremities without difficulty  Other:    Medical Decision Making  Medically screening exam initiated at 12:49 PM.  Appropriate orders placed.  Adriana Jordan was informed that the remainder of the evaluation will be completed by another provider, this initial triage assessment does not replace that evaluation, and the importance of remaining in the ED until their evaluation is complete.     Fransico Meadow, [place] 06/21/22 1250

## 2022-06-21 NOTE — H&P (Signed)
History and Physical    Patient: Adriana Jordan NWG:[id number] DOB: March 25, 1935 DOA: 06/21/2022 DOS: the patient was seen and examined on 06/21/2022 PCP: Dixie Dials, MD  Patient coming from: Home  Chief Complaint:  Chief Complaint  Patient presents with   Weakness       HPI:  Mrs. Adriana Jordan is an 87 y.o. F with hx pituitary adenoma s/p radiation, adrenal insufficiency diagnosed in the hospital, not yet seen Endocrinology, now on Cortef, HTN and CAD who presented with few days generalized weakness and severe headache.  In the ER, CT head normal.  Electrolytes and renal function normal.  CBC unremarkable.  VBG normal, CK elevated but at baseline.  ECG NSR.  MRI brain was obtained due to concern for basilar stroke and showed no acute findings, only old lacunar infarcts.  Admitted for pain control, anti-emetics      Review of Systems  Constitutional:  Positive for malaise/fatigue. Negative for chills and fever.  HENT:  Positive for sore throat.   Respiratory:  Negative for cough.   Neurological:  Positive for dizziness, weakness and headaches. Negative for speech change, focal weakness, seizures and loss of consciousness.  All other systems reviewed and are negative.    Past Medical History:  Diagnosis Date   Arthritis    "all over" (11/14/2017)   Brain tumor ([place])    "I still have it"; not cancer (11/14/2017)   Chronic lower back pain    Coronary artery disease    Dr. Doylene Canard   GERD (gastroesophageal reflux disease)    History of blood transfusion    "don't remember why" (11/14/2017)   History of radiation therapy 07/03/2018   brain, pituitary/ 12.5 Gy in 1 fraction   Hypercholesteremia    Hypertension    MI (myocardial infarction) ([place]) 2006   PONV (postoperative nausea and vomiting)    Skin cancer    "right forearm"   Past Surgical History:  Procedure Laterality Date   ANTERIOR CERVICAL DECOMP/DISCECTOMY FUSION     BACK SURGERY     CARPAL TUNNEL RELEASE  Bilateral    CORONARY ANGIOPLASTY     DES mid LAD and mid RCA 09/21/04   CRANIOTOMY N/A 05/21/2015   Procedure: Transsphenoidal resection of pituitary tumor with Dr. Radene Journey for approach;  Surgeon: Leeroy Cha, MD;  Location: [facility] NEURO ORS;  Service: Neurosurgery;  Laterality: N/A;  Transsphenoidal resection of pituitary tumor with Dr. Radene Journey for approach   SHOULDER OPEN ROTATOR CUFF REPAIR Bilateral    SKIN CANCER EXCISION Right    forearm   thumb surgery Right    placed srews to make straight   THYROID SURGERY     "goiter removed"   Social History:  reports that she quit smoking about 53 years ago. Her smoking use included cigarettes. She has a 0.60 pack-year smoking history. She has never used smokeless tobacco. She reports that she does not currently use alcohol. She reports that she does not use drugs.  Allergies  Allergen Reactions   Codeine Nausea And Vomiting    Tolerates hydrocodone    Family History  Problem Relation Age of Onset   Cancer Father 53       throat- smoker    Breast cancer Sister 16   Cancer Brother 17       lung- smoker    Prior to Admission medications   Medication Sig Start Date End Date Taking? Authorizing Provider  hydrocortisone (CORTEF) 10 MG tablet Take 1 tablet (10 mg total)  by mouth daily. Patient taking differently: Take 10 mg by mouth every evening. 03/06/22  Yes Regalado, Belkys A, MD  hydrocortisone (CORTEF) 20 MG tablet Take 1 tablet (20 mg total) by mouth every morning. 03/06/22  Yes Regalado, Belkys A, MD  Multiple Vitamins-Minerals (PRESERVISION AREDS 2) CAPS Take 1 capsule by mouth every morning.   Yes [provider]    Physical Exam: Vitals:   06/21/22 1600 06/21/22 1800 06/21/22 1813 06/21/22 1930  BP: (!) 157/88 (!) 148/73  (!) 144/85  Pulse: (!) 109 (!) 101  (!) 104  Resp: '10 17  19  '$ Temp:   98.5 F (36.9 C)   TempSrc:   Oral   SpO2: 96% 95%  95%          General appearance: Elderly adult female,  appears weak and tired, sluggish, poorly responsive     HEENT: Corneas clear, lids and lashes puffy, oropharynx dry, dentition poor, lips dry, normal auditory acuity Cardiac: RRR, no murmurs, rubs, or gallops, cap refill normal, JVP normal, no peripheral edema Respiratory: Normal respiratory rate and rhythm, lungs clear without rales or wheezes Abdomen: Abdomen soft without tenderness palpation or guarding in all quadrants MSK: Symmetrical without gross deformities of the hands, large joints of the legs Skin: Skin intact without significant rashes or lesions, no myxedema on the shins Neuro: Muscle tone severely decreased, speech fluent but hypophonic, patient's recall, recent and remote seems somewhat impaired, movement is symmetric, no nystagmus Psych: Attention span and concentration seem diminished, affect blunted, slowed speech      Data Reviewed: Discussed with emergency room physician Patient metabolic panel shows mild hyponatremia, normal renal function Complete blood count unremarkable, mild leukopenia CK 500 Cortisol 6.8 COVID-negative VBG unremarkable Echocardiogram from last February shows normal EF, grade 1 diastolic dysfunction, EKG this admission personally reviewed, shows normal sinus rhythm MRI brain shows old lacunar strokes, otherwise unremarkable    Assessment and Plan: * Headache  Generalized weakness Dizziness Symptom onset was acute over 3-4 days.  The constellation of new severe acute generalized weakness and now severe headache suggests viral syndrome, possibly aseptic meningitis (note HSV-like rash on right lip).  Pituitary adenoma raises possibility of severe hypothyroidism although time frame doesn't fit.  Stroke or intracranial hemorrhage ruled out.   - Rule out flu - Empiric acyclovir given HSV like rash on lip and severe headache - If flu positive, will stop acyclovir and start Tamiflu - If flu negative, will obtain LP    Hyponatremia Sodium 133,  asymptomatic - Continue IV fluids overnight - Check BMP tomorrow  Elevated CK This appears to be chronic, I am uncertain the significance.  Hypertension BP normalized, not on meds at home  Pituitary adenoma [facility]) Is post to have outpatient endocrinology follow-up, has not done this yet  Does not appear to be in adrenal crisis at this time - Continue home hydrocortisone         Advance Care Planning: Full code  Consults: None needed  Family Communication: Daughter at the bedside  Severity of Illness: The appropriate patient status for this patient is OBSERVATION. Observation status is judged to be reasonable and necessary in order to provide the required intensity of service to ensure the patient's safety. The patient's presenting symptoms, physical exam findings, and initial radiographic and laboratory data in the context of their medical condition is felt to place them at decreased risk for further clinical deterioration. Furthermore, it is anticipated that the patient will be medically stable for discharge  from the hospital within 2 midnights of admission.   Author: Edwin Dada, MD 06/21/2022 7:46 PM  For on call review [email or web address].

## 2022-06-21 NOTE — Progress Notes (Signed)
Flu negative.  Discussed with Neurology, they will see.  With her age and burden of white matter disease, there is considerable uncertainty whether this episode is the same or different than her last few presentations with similar generalized complaints.  Note Na dropping.  Stop fluids Trend BMP closely overnight

## 2022-06-21 NOTE — Hospital Course (Addendum)
Adriana Jordan is an 87 y.o. F with hx pituitary adenoma s/p radiation, adrenal insufficiency diagnosed in the hospital, not yet seen Endocrinology, now on Cortef, HTN and CAD who presented with few days generalized weakness and severe headache.  In the ER, CT head normal.  Electrolytes and renal function normal.  CBC unremarkable.  VBG normal, CK elevated but at baseline.  ECG NSR.  MRI brain was obtained due to concern for basilar stroke and showed no acute findings, only old lacunar infarcts.  Admitted for pain control, anti-emetics

## 2022-06-21 NOTE — Progress Notes (Signed)
Pharmacy Antibiotic Note  Adriana Jordan is a 87 y.o. female for which pharmacy has been consulted for acyclovir dosing for  herpes encephalitis .  SCr 1.01  Plan: Acyclovir IV '10mg'$  q12h LR 75 ml/hr Trend WBC, Fever, Renal function F/u cultures, clinical course, WBC, fever De-escalate when able     Temp (24hrs), Avg:98.1 F (36.7 C), Min:97.7 F (36.5 C), Max:98.5 F (36.9 C)  Recent Labs  Lab 06/21/22 1251  WBC 2.4*  CREATININE 1.01*    CrCl cannot be calculated (Unknown ideal weight.).    Allergies  Allergen Reactions   Codeine Nausea And Vomiting    Tolerates hydrocodone    Antimicrobials this admission: Acyclovir 11/13  >>  Microbiology results: Pending  Thank you for allowing pharmacy to be a part of this patient's care.  Lorelei Pont, PharmD, BCPS 06/21/2022 7:58 PM ED Clinical Pharmacist -  [phone]

## 2022-06-21 NOTE — ED Notes (Signed)
ED TO INPATIENT HANDOFF REPORT  ED Nurse Name and Phone #: Caryl Pina RN [phone]  S Name/Age/Gender Adriana Jordan 87 y.o. female Room/Bed: 017C/017C  Code Status   Code Status: Prior  Home/SNF/Other Home Patient oriented to: self, place, time, and situation Is this baseline? Yes   Triage Complete: Triage complete  Chief Complaint Headache [R51.9]  Triage Note Pt from home via EMS with generalized weakness, headache, and malaise x 4 days. Decreased PO intake, endorses nausea without vomiting.    Allergies Allergies  Allergen Reactions   Codeine Nausea And Vomiting    Tolerates hydrocodone    Level of Care/Admitting Diagnosis ED Disposition     ED Disposition  Admit   Condition  --   Comment  Hospital Area: [facility] [[id number]]  Level of Care: Med-Surg [16]  May place patient in observation at [facility] or [facility] if equivalent level of care is available:: Yes  Covid Evaluation: Confirmed COVID Negative  Diagnosis: Headache [[id number]]  Admitting Physician: Edwin Dada [[id number]]  Attending Physician: Edwin Dada [[id number]]          B Medical/Surgery History Past Medical History:  Diagnosis Date   Arthritis    "all over" (11/14/2017)   Brain tumor ([facility])    "I still have it"; not cancer (11/14/2017)   Chronic lower back pain    Coronary artery disease    Dr. Doylene Canard   GERD (gastroesophageal reflux disease)    History of blood transfusion    "don't remember why" (11/14/2017)   History of radiation therapy 07/03/2018   brain, pituitary/ 12.5 Gy in 1 fraction   Hypercholesteremia    Hypertension    MI (myocardial infarction) ([place]) 2006   PONV (postoperative nausea and vomiting)    Skin cancer    "right forearm"   Past Surgical History:  Procedure Laterality Date   ANTERIOR CERVICAL DECOMP/DISCECTOMY FUSION     BACK SURGERY     CARPAL TUNNEL RELEASE Bilateral    CORONARY ANGIOPLASTY     DES mid LAD and mid  RCA 09/21/04   CRANIOTOMY N/A 05/21/2015   Procedure: Transsphenoidal resection of pituitary tumor with Dr. Radene Journey for approach;  Surgeon: Leeroy Cha, MD;  Location: [facility] NEURO ORS;  Service: Neurosurgery;  Laterality: N/A;  Transsphenoidal resection of pituitary tumor with Dr. Radene Journey for approach   SHOULDER OPEN ROTATOR CUFF REPAIR Bilateral    SKIN CANCER EXCISION Right    forearm   thumb surgery Right    placed srews to make straight   THYROID SURGERY     "goiter removed"     A IV Location/Drains/Wounds Patient Lines/Drains/Airways Status     Active Line/Drains/Airways     Name Placement date Placement time Site Days   Peripheral IV 06/21/22 22 G 1.75" Anterior;Left Forearm 06/21/22  1515  Forearm  less than 1            Intake/Output Last 24 hours No intake or output data in the 24 hours ending 06/21/22 1635  Labs/Imaging Results for orders placed or performed during the hospital encounter of 06/21/22 (from the past 48 hour(s))  CBC with Differential     Status: Abnormal   Collection Time: 06/21/22 12:51 PM  Result Value Ref Range   WBC 2.4 (L) 4.0 - 10.5 K/uL   RBC 4.86 3.87 - 5.11 MIL/uL   Hemoglobin 15.6 (H) 12.0 - 15.0 g/dL   HCT 46.7 (H) 36.0 - 46.0 %   MCV  96.1 80.0 - 100.0 fL   MCH 32.1 26.0 - 34.0 pg   MCHC 33.4 30.0 - 36.0 g/dL   RDW 14.7 11.5 - 15.5 %   Platelets 293 150 - 400 K/uL   nRBC 0.0 0.0 - 0.2 %   Neutrophils Relative % 41 %   Neutro Abs 1.0 (L) 1.7 - 7.7 K/uL   Lymphocytes Relative 41 %   Lymphs Abs 1.0 0.7 - 4.0 K/uL   Monocytes Relative 9 %   Monocytes Absolute 0.2 0.1 - 1.0 K/uL   Eosinophils Relative 9 %   Eosinophils Absolute 0.2 0.0 - 0.5 K/uL   Basophils Relative 0 %   Basophils Absolute 0.0 0.0 - 0.1 K/uL   nRBC 0 0 /100 WBC   Abs Immature Granulocytes 0.00 0.00 - 0.07 K/uL    Comment: Performed at [facility] [address], [place], [place] [address]  Comprehensive metabolic panel     Status: Abnormal    Collection Time: 06/21/22 12:51 PM  Result Value Ref Range   Sodium 133 (L) 135 - 145 mmol/L   Potassium 3.5 3.5 - 5.1 mmol/L   Chloride 94 (L) 98 - 111 mmol/L   CO2 17 (L) 22 - 32 mmol/L   Glucose, Bld 76 70 - 99 mg/dL    Comment: Glucose reference range applies only to samples taken after fasting for at least 8 hours.   BUN 14 8 - 23 mg/dL   Creatinine, Ser 1.01 (H) 0.44 - 1.00 mg/dL   Calcium 9.7 8.9 - 10.3 mg/dL   Total Protein 8.2 (H) 6.5 - 8.1 g/dL   Albumin 4.1 3.5 - 5.0 g/dL   AST 47 (H) 15 - 41 U/L   ALT 25 0 - 44 U/L   Alkaline Phosphatase 81 38 - 126 U/L   Total Bilirubin 0.8 0.3 - 1.2 mg/dL   GFR, Estimated 55 (L) >60 mL/min    Comment: (NOTE) Calculated using the CKD-EPI Creatinine Equation (2021)    Anion gap 22 (H) 5 - 15    Comment: Electrolytes repeated to confirm. Performed at [facility], [place] [address], [place], [place] [address]   CK     Status: Abnormal   Collection Time: 06/21/22 12:51 PM  Result Value Ref Range   Total CK 502 (H) 38 - 234 U/L    Comment: Performed at [facility], [place] [address] [place], [place] [address]  Cortisol     Status: None   Collection Time: 06/21/22  2:00 PM  Result Value Ref Range   Cortisol, Plasma 6.8 ug/dL    Comment: (NOTE) AM    6.7 - 22.6 ug/dL PM   <10.0       ug/dL Performed at [facility] [address] [place], [place] [address]   SARS Coronavirus 2 by RT PCR (hospital order, performed in [facility] hospital lab) *cepheid single result test* Anterior Nasal Swab     Status: None   Collection Time: 06/21/22  2:14 PM   Specimen: Anterior Nasal Swab  Result Value Ref Range   SARS Coronavirus 2 by RT PCR NEGATIVE NEGATIVE    Comment: (NOTE) SARS-CoV-2 target nucleic acids are NOT DETECTED.  The SARS-CoV-2 RNA is generally detectable in upper and lower respiratory specimens during the acute phase of infection. The lowest concentration of SARS-CoV-2 viral copies this assay can detect is  250 copies / mL. A negative result does not preclude SARS-CoV-2 infection and should  not be used as the sole basis for treatment or other patient management decisions.  A negative result may occur with improper specimen collection / handling, submission of specimen other than nasopharyngeal swab, presence of viral mutation(s) within the areas targeted by this assay, and inadequate number of viral copies (<250 copies / mL). A negative result must be combined with clinical observations, patient history, and epidemiological information.  Fact Sheet for Patients:   https://www.patel.info/  Fact Sheet for Healthcare Providers: https://hall.com/  This test is not yet approved or  cleared by the [place] FDA and has been authorized for detection and/or diagnosis of SARS-CoV-2 by FDA under an Emergency Use Authorization (EUA).  This EUA will remain in effect (meaning this test can be used) for the duration of the COVID-19 declaration under Section 564(b)(1) of the Act, 21 U.S.C. section 360bbb-3(b)(1), unless the authorization is terminated or revoked sooner.  Performed at [facility], [place] [address], [place], [place] [address]   I-Stat venous blood gas, [facility] ED only)     Status: Abnormal   Collection Time: 06/21/22  2:20 PM  Result Value Ref Range   pH, Ven 7.357 7.25 - 7.43   pCO2, Ven 36.3 (L) 44 - 60 mmHg   pO2, Ven 231 (H) 32 - 45 mmHg   Bicarbonate 20.4 20.0 - 28.0 mmol/L   TCO2 21 (L) 22 - 32 mmol/L   O2 Saturation 100 %   Acid-base deficit 4.0 (H) 0.0 - 2.0 mmol/L   Sodium 126 (L) 135 - 145 mmol/L   Potassium 4.3 3.5 - 5.1 mmol/L   Calcium, Ion 0.95 (L) 1.15 - 1.40 mmol/L   HCT 46.0 36.0 - 46.0 %   Hemoglobin 15.6 (H) 12.0 - 15.0 g/dL   Sample type VENOUS    CT Head Wo Contrast  Result Date: 06/21/2022 CLINICAL DATA:  Headache, new onset.  Malaise for 4 days. EXAM: CT HEAD WITHOUT CONTRAST TECHNIQUE: Contiguous axial  images were obtained from the base of the skull through the vertex without intravenous contrast. RADIATION DOSE REDUCTION: This exam was performed according to the departmental dose-optimization program which includes automated exposure control, adjustment of the mA and/or kV according to patient size and/or use of iterative reconstruction technique. COMPARISON:  CT head without contrast 03/01/2022. MRI head without contrast 03/25/2022. FINDINGS: Brain: Mild generalized atrophy and moderate diffuse white matter disease is similar the prior studies. No acute infarct, hemorrhage, or mass lesion is present. Basal ganglia are intact. The ventricles are proportionate to the degree of atrophy. No significant extraaxial fluid collection is present. The brainstem and cerebellum are within normal limits. Vascular: Atherosclerotic calcifications are present within the cavernous internal carotid arteries bilaterally. No hyperdense vessel is present. Skull: Calvarium is intact. No focal lytic or blastic lesions are present. No significant extracranial soft tissue lesion is present. Sinuses/Orbits: Chronic opacification of the right sphenoid sinus is present. Minimal mucosal thickening is present the left sphenoid sinus. The paranasal sinuses and mastoid air cells are otherwise clear. The globes and orbits are within normal limits. IMPRESSION: 1. No acute intracranial abnormality or significant interval change. 2. Stable atrophy and moderate white matter disease. This likely reflects the sequela of chronic microvascular ischemia. 3. Chronic opacification of the right sphenoid sinus. Electronically Signed   By: San Morelle M.D.   On: 06/21/2022 14:57    Pending Labs Unresulted Labs (From admission, onward)     Start     Ordered   06/21/22 1541  Urinalysis,  Routine w reflex microscopic  Once,   URGENT        06/21/22 1540            Vitals/Pain Today's Vitals   06/21/22 1522 06/21/22 1558 06/21/22 1559  06/21/22 1600  BP: (!) 147/82   (!) 157/88  Pulse: (!) 102 (!) 112 (!) 110 (!) 109  Resp: 20   10  Temp:      TempSrc:      SpO2: 93% 91% (!) 89% 96%  PainSc:        Isolation Precautions No active isolations  Medications Medications  metoCLOPramide (REGLAN) injection 10 mg (10 mg Intravenous Given 06/21/22 1522)  sodium chloride 0.9 % bolus 1,000 mL (1,000 mLs Intravenous New Bag/Given 06/21/22 1524)  dexamethasone (DECADRON) injection 10 mg (10 mg Intravenous Given 06/21/22 1559)  ketorolac (TORADOL) 15 MG/ML injection 15 mg (15 mg Intravenous Given 06/21/22 1559)    Mobility walks with person assist Low fall risk   Focused Assessments Neuro Assessment Handoff:           Neuro Assessment:   Neuro Checks:      Last Documented NIHSS Modified Score:   Has TPA been given? No    R Recommendations: See Admitting Provider Note  Report given to:   Additional Notes: Pt normally independent, increased weakness since Thursday. Poor PO intake, headache, and sore throat with fatigue. A&Ox4.

## 2022-06-21 NOTE — ED Notes (Signed)
Patient transported to MRI 

## 2022-06-21 NOTE — ED Provider Notes (Signed)
[facility] EMERGENCY DEPARTMENT Provider Note   CSN: [id number] Arrival date & time: 06/21/22  1233     History  Chief Complaint  Patient presents with   Weakness    Adriana Jordan is a 87 y.o. female presenting to the ED today with multiple complaints since Thursday.  States symptoms originally began with a headache and dizziness.  Headache encompasses entire head.  Without vision changes, eye pain, fever, chills, or neck stiffness.  States that dizziness is so strong she feels she is unable to walk without assistance.  Feels off balance and as if she cannot walk in a straight line.  Usually ambulates without well without assistance.  Endorses some throat soreness however denies recent URI symptoms such as cough, congestion, runny nose.  Also reports significant decreased food and fluid intake.  Last meal sometime on Thursday. Had "a few sips of Ensure" this morning only.  Also complaining of somewhat chronic but worsening muscle cramps of the lower legs.  Feels weak all over.  Also with shortness of breath, feels as if she cannot move air well.  No Hx of COPD or asthma.  Denies urinary symptoms though endorses decreased stool output over the last few days.  Hx of pituitary adenoma, HTN, hyperlipidemia, GERD, protein malnutrition, multiple falls, and adrenal insufficiency.  The history is provided by the patient and medical records.  Weakness     Home Medications Prior to Admission medications   Medication Sig Start Date End Date Taking? Authorizing Provider  Apoaequorin (PREVAGEN PO) Take 1 tablet by mouth every morning.    [provider]  hydrocortisone (CORTEF) 10 MG tablet Take 1 tablet (10 mg total) by mouth daily. 03/06/22   Regalado, Belkys A, MD  hydrocortisone (CORTEF) 20 MG tablet Take 1 tablet (20 mg total) by mouth every morning. 03/06/22   Regalado, Belkys A, MD  metoprolol succinate (TOPROL-XL) 25 MG 24 hr tablet Take 25 mg by mouth at  bedtime. Patient not taking: Reported on 02/22/2022 09/17/21   [provider]  Multiple Vitamins-Minerals (PRESERVISION AREDS 2) CAPS Take 1 capsule by mouth every morning.    [provider]  nitroGLYCERIN (NITROSTAT) 0.4 MG SL tablet Place 1 tablet (0.4 mg total) under the tongue every 5 (five) minutes x 3 doses as needed for chest pain. Patient not taking: Reported on 02/22/2022 09/13/21   Dixie Dials, MD  omeprazole (PRILOSEC) 20 MG capsule Take 20 mg by mouth daily.    [provider]  pantoprazole (PROTONIX) 20 MG tablet Take 2 tablets (40 mg total) by mouth daily for 14 days. Patient not taking: Reported on 02/22/2022 12/19/21 02/21/22  Gareth Morgan, MD  rosuvastatin (CRESTOR) 20 MG tablet Take 20 mg by mouth at bedtime. 07/17/21   [provider]  sodium bicarbonate 650 MG tablet Take 2 tablets (1,300 mg total) by mouth 2 (two) times daily. 03/05/22   Regalado, Cassie Freer, MD      Allergies    Codeine    Review of Systems   Review of Systems  Neurological:  Positive for weakness.    Physical Exam Updated Vital Signs BP 130/76 (BP Location: Right Arm)   Pulse 97   Temp 97.7 F (36.5 C) (Oral)   Resp 14   LMP  (LMP Unknown)   SpO2 97%  Physical Exam Vitals and nursing note reviewed.  Constitutional:      General: She is not in acute distress.    Appearance: She is well-developed.  She is ill-appearing. She is not diaphoretic.  HENT:     Head: Normocephalic and atraumatic.     Right Ear: External ear normal.     Left Ear: External ear normal.     Nose: Nose normal.  Eyes:     General: Lids are normal. Gaze aligned appropriately. No scleral icterus.    Extraocular Movements: Extraocular movements intact.     Conjunctiva/sclera: Conjunctivae normal.     Pupils: Pupils are equal, round, and reactive to light.  Cardiovascular:     Rate and Rhythm: Regular rhythm. Tachycardia present.     Pulses:          Radial pulses are 2+ on the right  side and 2+ on the left side.       Popliteal pulses are 2+ on the right side and 2+ on the left side.       Dorsalis pedis pulses are 2+ on the right side and 2+ on the left side.       Posterior tibial pulses are 2+ on the right side and 2+ on the left side.     Heart sounds: Normal heart sounds. No murmur heard. Pulmonary:     Effort: Pulmonary effort is normal. No respiratory distress.     Breath sounds: Normal breath sounds.     Comments: Tachypneic, shallow breaths, equal chest rise.  Communicates with soft whisper. Chest:     Chest wall: No tenderness.  Abdominal:     Palpations: Abdomen is soft.     Tenderness: There is no abdominal tenderness.  Musculoskeletal:        General: No swelling.     Cervical back: Neck supple.     Right lower leg: No edema.     Left lower leg: No edema.  Skin:    General: Skin is warm and dry.     Capillary Refill: Capillary refill takes less than 2 seconds.     Coloration: Skin is not cyanotic.  Neurological:     Mental Status: She is alert and oriented to person, place, and time.     GCS: GCS eye subscore is 4. GCS verbal subscore is 5. GCS motor subscore is 6.     Cranial Nerves: No dysarthria or facial asymmetry.     Sensory: No sensory deficit.     Motor: Weakness (Global) present. No pronator drift.     Coordination: Coordination normal. Finger-Nose-Finger Test and Heel to Shin Test normal.     Gait: Gait abnormal (Subjective dizziness and weakness).  Psychiatric:        Mood and Affect: Mood normal.        Behavior: Behavior is cooperative.     ED Results / Procedures / Treatments   Labs (all labs ordered are listed, but only abnormal results are displayed) Labs Reviewed  CBC WITH DIFFERENTIAL/PLATELET - Abnormal; Notable for the following components:      Result Value   WBC 2.4 (*)    Hemoglobin 15.6 (*)    HCT 46.7 (*)    Neutro Abs 1.0 (*)    All other components within normal limits  I-STAT VENOUS BLOOD GAS, ED -  Abnormal; Notable for the following components:   pCO2, Ven 36.3 (*)    pO2, Ven 231 (*)    TCO2 21 (*)    Acid-base deficit 4.0 (*)    Sodium 126 (*)    Calcium, Ion 0.95 (*)    Hemoglobin 15.6 (*)    All other  components within normal limits  SARS CORONAVIRUS 2 BY RT PCR  COMPREHENSIVE METABOLIC PANEL  URINALYSIS, ROUTINE W REFLEX MICROSCOPIC  CORTISOL  CK    EKG None  Radiology No results found.  Procedures Procedures    Medications Ordered in ED Medications  metoCLOPramide (REGLAN) injection 10 mg (has no administration in time range)  sodium chloride 0.9 % bolus 1,000 mL (has no administration in time range)    ED Course/ Medical Decision Making/ A&P Clinical Course as of 06/21/22 1509  Mon Jun 21, 2022  1457 H/o protein malnutrition, MI, HTN. P/w generalized weakness, HA, dizzyness worse with ambulation since Thursday. Feels unsteady on feet. Records say h/o CVA but patient denies. Walks w/o walker or cane at baseline. Cannot lift out of bed currently. Poor p.o. intake since Thursday, no solids since then. If CT neg may need MRI. Also SOB. Also on hydrocort for adrenal insufficiency. Getting IVFs, reglan. Getting CK for lower extremity cramps, on statin.  [DG]  8299 Could be adrenal insufficiency, FTT?  [DG]    Clinical Course User Index [DG] Renard Matter, MD                           Medical Decision Making  87 y.o. female presents to the ED for concern of Weakness     This involves an extensive number of treatment options, and is a complaint that carries with it a high risk of complications and morbidity.     Past Medical History / Co-morbidities / Social History: Hx of Hx of pituitary adenoma, HTN, hyperlipidemia, GERD, protein malnutrition, multiple falls, and adrenal insufficiency. Social Determinants of Health include: Elderly  Additional History:  Obtained by chart review.  Notably recent diagnosis of adrenal insufficiency earlier this  year.  Lab Tests: I ordered, and personally interpreted labs.  The pertinent results include:   WBC 2.4, mild elevated H&H llikely due to dehydration  Imaging Studies: I ordered imaging studies including CT head, pending  Cardiac Monitoring: The patient was maintained on a cardiac monitor.  I personally viewed and interpreted the cardiac monitored which showed an underlying rhythm of: Sinus tachycardia  ED Course / Critical Interventions: Pt ill-appearing on exam.  Appears clinically weak and dehydrated.  Speaks in soft whispers and in short phrases.  Presenting with headache, dizziness, disequilibrium, shortness of breath, throat soreness, general weakness and lower extremity cramps.  Headache, dizziness, disequilibrium, and weakness started Thursday with no apparent preceding symptoms/factors per pt.  Shortness of breath  started in the last 2-3 weeks.  Lower extremity cramps chronic over a few months, however worsened over the last few days.  Tachypneic, tachycardic and hypertensive on exam without fever.  Decreased WBC 2.4.  Increased H&H, likely due to dehydration.  On statin, which may be contributing to muscle aches.  Possible atypical rhabdomyolysis.  Unclear at this time.  Takes steroids daily for adrenal insufficiency diagnosed this year per chart review.  Concern for possible metabolic acidosis, pituitary adenoma, and/or posterior circulation stroke.  Sepsis still possible, meets SIRs criteria though without clear evidence of infection at this point.  PE still considered but appears less likely at this time.  Believe MRI of brain appropriate regardless of CT head findings for further evaluation.  Also gentle IVF for rehydration recommended.   Disposition: 3716 care of Edsel Petrin transferred to Resident Dr. Osie Bond at the end of my shift as the patient will require reassessment once labs/imaging have  resulted.  Patient presentation, ED course, and plan of care discussed with  review of all pertinent labs and imaging.  Please see his/her note for further details regarding further ED course and disposition.   Plan at time of handoff is still broad.  Concern for possible adrenal insufficiency vs sepsis vs posterior circulation stroke vs PE vs FTT.  Still pending initial labs.  Anticipate admission.  This may be altered or completely changed at the discretion of the oncoming team pending results of further workup.  This patient was a shared case encounter with attending Dr. Kathrynn Humble, who directly contributed to the proposed treatment course and cosigned this note including patient's presenting symptoms, physical exam, and planned diagnostics and interventions.  Attending physician stated agreement with plan or made changes to plan which were implemented.     This chart was dictated using voice recognition software.  Despite best efforts to proofread, errors can occur which can change the documentation meaning.         Final Clinical Impression(s) / ED Diagnoses Final diagnoses:  None    Rx / DC Orders ED Discharge Orders     None         Candace Cruise 24/82/50 Cassia, Ankit, MD 06/21/22 1544

## 2022-06-21 NOTE — Consult Note (Signed)
Neurology Consultation Reason for Consult: HA, nausea, vomitting and confusion  Requesting Physician: Myrene Buddy   CC: Headache, generalized weakness  History is obtained from: Patient and chart review, family not available overnight  HPI: Adriana Jordan is a 87 y.o. female with a past medical history significant for pituitary adenoma s/p surgical resection and radiation, recent concern for adrenal insufficiency, hypertension, hyperlipidemia, coronary artery disease with remote MI, right arm skin cancer, cervical spine degenerative disc disease s/p ACDF.  She reports that she missed church on Sunday due to headache and has been having severe leg cramping for the last 2 to 3 weeks and makes it difficult for her to sleep at night.  She notes she has taken some over-the-counter pills designed for leg cramping without improvement in her symptoms.  She has also been drinking vinegar in an attempt to relieve the cramps.  She notes some left leg heaviness that she feels only at night in bed that does not affect her ambulation and poor appetite since Thursday with nausea but no vomiting until she came into the hospital (1 single episode here).  She notes she has been taking her hydrocortisone prescribed on discharge in late July for concern for adrenal insufficiency, awaiting follow-up with endocrinology, and she has not been throwing up this medication.  Regarding her headache, she notes that it is not positional, it is pounding and in bilateral eyes, and has not improved with over-the-counter pain medications, it is not associated with any vision changes, she is not having any fevers although she has had some chills with the change in weather.  Nurse at bedside confirms she has been quite well overnight other than at times some slight disorientation which readily clears  For her pituitary adenoma she follows with Dr. Mickeal Skinner who last saw her in August 23 and noted she was doing well with clinical  and radiographic stability.  There was some concern from primary team about possible rash on her right lip for which she was started on empiric acyclovir and neurology was consulted  Of note she was admitted on 7/18 for similar generalized weakness and there was concern for a tick bite for which she was discharged with a 10-day course of doxycycline.  [facility] spotted fever titers eventually  resulted negative She was subsequently admitted from 7/24 - 7/28 and there was some concern for adrenal insufficiency given her morning cortisol of 2.8 for which she was started on hydrocortisone  ROS: All other review of systems was negative except as noted in the HPI.   Past Medical History:  Diagnosis Date   Arthritis    "all over" (11/14/2017)   Brain tumor ([place])    "I still have it"; not cancer (11/14/2017)   Chronic lower back pain    Coronary artery disease    Dr. Doylene Canard   GERD (gastroesophageal reflux disease)    History of blood transfusion    "don't remember why" (11/14/2017)   History of radiation therapy 07/03/2018   brain, pituitary/ 12.5 Gy in 1 fraction   Hypercholesteremia    Hypertension    MI (myocardial infarction) ([place]) 2006   PONV (postoperative nausea and vomiting)    Skin cancer    "right forearm"   Past Surgical History:  Procedure Laterality Date   ANTERIOR CERVICAL DECOMP/DISCECTOMY FUSION     BACK SURGERY     CARPAL TUNNEL RELEASE Bilateral    CORONARY ANGIOPLASTY     DES mid LAD and mid RCA  09/21/04   CRANIOTOMY N/A 05/21/2015   Procedure: Transsphenoidal resection of pituitary tumor with Dr. Radene Journey for approach;  Surgeon: Leeroy Cha, MD;  Location: [facility] NEURO ORS;  Service: Neurosurgery;  Laterality: N/A;  Transsphenoidal resection of pituitary tumor with Dr. Radene Journey for approach   SHOULDER OPEN ROTATOR CUFF REPAIR Bilateral    SKIN CANCER EXCISION Right    forearm   thumb surgery Right    placed srews to make straight   THYROID  SURGERY     "goiter removed"   Current Outpatient Medications  Medication Instructions   hydrocortisone (CORTEF) 20 mg, Oral, BH-each morning   hydrocortisone (CORTEF) 10 mg, Oral, Daily   Multiple Vitamins-Minerals (PRESERVISION AREDS 2) CAPS 1 capsule, Oral, Every morning   Family History  Problem Relation Age of Onset   Cancer Father 34       throat- smoker    Breast cancer Sister 68   Cancer Brother 20       lung- smoker    Social History:  reports that she quit smoking about 53 years ago. Her smoking use included cigarettes. She has a 0.60 pack-year smoking history. She has never used smokeless tobacco. She reports that she does not currently use alcohol. She reports that she does not use drugs.   Exam: Current vital signs: BP (!) 144/85   Pulse (!) 104   Temp 98.5 F (36.9 C) (Oral)   Resp 19   LMP  (LMP Unknown)   SpO2 95%  Vital signs in last 24 hours: Temp:  [97.7 F (36.5 C)-98.5 F (36.9 C)] 98.5 F (36.9 C) (11/13 1813) Pulse Rate:  [97-112] 104 (11/13 1930) Resp:  [10-28] 19 (11/13 1930) BP: (130-157)/(73-107) 144/85 (11/13 1930) SpO2:  [89 %-99 %] 95 % (11/13 1930)   Physical Exam  Constitutional: Appears well-developed and well-nourished.  At the time of my evaluation at 6 AM she reports that her headache is resolved Psych: Affect pleasant and cooperative Eyes: No scleral injection HENT: No oropharyngeal obstruction.  Some dryness to her lips but no obvious rash that I can appreciate.  No nuchal rigidity MSK: no joint deformities.  Cardiovascular: Perfusing extremities well Respiratory: Effort normal, non-labored breathing GI: Soft.  No distension.  Skin: Warm dry and intact visible skin  Neuro: Mental Status: Patient is awake, alert, oriented to person, place, month, year, and situation. Patient is able to give a clear and coherent history, consistently reporting the same details recorded in the chart regarding recent events and easily able to  recall the name of the neurosurgeon who did her pituitary surgery No signs of aphasia or neglect Cranial Nerves: II: Visual Fields are full. Pupils are equal, round, and reactive to light.  Funduscopic examination limited by patient's tolerance of the examination, but she specifically reports no photophobia with this examination III,IV, VI: EOMI without ptosis or diploplia.  V: Facial sensation is symmetric to temperature VII: Facial movement is symmetric.  VIII: hearing is intact to voice X: Uvula elevates symmetrically XI: Shoulder shrug is symmetric. XII: tongue is midline without atrophy or fasciculations.  Motor: Tone is normal. Bulk is normal. 5/5 strength was present in all four extremities.  She does report some subjective heaviness of the left leg Sensory: Sensation is symmetric to light touch and temperature in the arms and legs, other than the fact she reports slightly reduced sensation in the left leg to temperature compared to the right Deep Tendon Reflexes: 2+ and symmetric in the brachioradialis, 2+  left patellae, unable to elicit right patella, unable to elicit Achilles.  Plantars: Toes are downgoing bilaterally.  Cerebellar: FNF and HKS are intact bilaterally Gait:  Deferred   I have reviewed labs in epic and the results pertinent to this consultation are: Labs are notable for initial sodium of 133 with a drop to 126 and subsequently down to 131 Creatinine was mildly elevated from baseline to 1.01 from a baseline of 0.89 GFR 55 AST mildly elevated at 47, ALT 25 CK mildly elevated at 502 (previously 584 in July of this year, 323 in April 2019) Chronic leukopenia with white count 2.4  Lab Results  Component Value Date   VITAMINB12 1,455 (H) 03/03/2022     I have reviewed the images obtained:  MRI brain personally reviewed, agree with radiology:    1. No acute intracranial abnormality or significant interval change. 2. Stable atrophy and white matter disease  likely reflects the sequela of chronic microvascular ischemia.  Impression: Given rapid resolution in patient's headache and her benign neurological exam at the time of my evaluation, I think the risks of continuing acyclovir treatment outweigh the benefits, would not expect HSV encephalitis to have such a dramatic improvement.  Regarding her mild CK elevation, this appears chronic and given she is minimally symptomatic on examination can be followed up outpatient.  Mild intermittent confusion may be related primarily to age and her mild chronic microvascular changes on MRI.  Low concern for seizure but given multiple hospital presentations for confusion and risk factor of prior intracranial surgery, I do think it is reasonable to obtain an EEG.  She does have some electrolyte derangements (hyponatremia, hypochloremia, anion gap), potentially due to a recent non-specific viral syndrome leading to her symptoms and/or poor oral intake. Appreciate workup of these issues per primary team.  Recommendations: -Discontinued acyclovir -No indication for lumbar puncture at this time -Routine EEG, -Outpatient neurocognitive testing -Neurology will follow-up EEG otherwise will be available as needed going forward   [place] [phone] Available 7 AM to 7 PM, outside these hours please contact Neurologist on call listed on AMION , But yet I got up, anion gap anion gap, Which could be

## 2022-06-21 NOTE — ED Provider Notes (Signed)
  Assumption of Care    I assumed care of Adriana Jordan on 70/26/3785 at 3 PM from [place], [place].   Briefly, Adriana Jordan is a 87 y.o. female who presented for generalized weakness, dizziness with ambulation, poor p.o. intake. The signout from the previous ED provider included: Clinical Course as of 06/21/22 1809  Mon Jun 21, 2022  1457 H/o protein malnutrition, MI, HTN. P/w generalized weakness, HA, dizzyness worse with ambulation since Thursday. Feels unsteady on feet. Records say h/o CVA but patient denies. Walks w/o walker or cane at baseline. Cannot lift out of bed currently. Poor p.o. intake since Thursday, no solids since then. If CT neg may need MRI. Also SOB. Also on hydrocort for adrenal insufficiency. Getting IVFs, reglan. Getting CK for lower extremity cramps, on statin.  [DG]  8850 Could be adrenal insufficiency, FTT?  [DG]    Clinical Course User Index [DG] Renard Matter, MD     Please refer to the original provider's note for additional information regarding the care of Edsel Petrin.  Reassessment: Vital Signs:  The most current vitals were  Vitals:   06/21/22 1559 06/21/22 1600  BP:  (!) 157/88  Pulse: (!) 110 (!) 109  Resp:  10  Temp:    SpO2: (!) 89% 96%    Physical Exam:  Hemodynamics:  The patient is hemodynamically stable. Mental Status:  The patient is alert. PE: Mild right facial droop at mouth, weakness with right cheek inflation. Normal WOB.  Right upper lip rash. No neck stiffness, full ROM.    Additional MDM: On exam patient does have mild right-sided facial weakness compared to left.  She also has a small rash above the right lip, potential for possible zoster.  Feel patient does require admission for further work-up and brain MRI to further evaluate.  Will administer additional medications for headache (single dose of Toradol, dexamethasone) as her headache has continued after the Reglan.  Given inability to ambulate, lack  of return to baseline, and concerns for new neurologic deficits, feel the patient requires admission for further management.  Patient admitted to hospitalist for further management.  Diagnosis: No diagnosis found.         Renard Matter, MD 06/21/22 Dionicia Abler    Carmin Muskrat, MD 06/21/22 [phone]

## 2022-06-21 NOTE — Assessment & Plan Note (Signed)
This appears to be chronic, I am uncertain the significance.

## 2022-06-22 ENCOUNTER — Observation Stay (HOSPITAL_COMMUNITY): Payer: Medicare PPO

## 2022-06-22 DIAGNOSIS — D352 Benign neoplasm of pituitary gland: Secondary | ICD-10-CM | POA: Diagnosis not present

## 2022-06-22 DIAGNOSIS — R4182 Altered mental status, unspecified: Secondary | ICD-10-CM | POA: Diagnosis not present

## 2022-06-22 DIAGNOSIS — E872 Acidosis, unspecified: Secondary | ICD-10-CM

## 2022-06-22 DIAGNOSIS — R531 Weakness: Secondary | ICD-10-CM | POA: Diagnosis not present

## 2022-06-22 DIAGNOSIS — R519 Headache, unspecified: Secondary | ICD-10-CM | POA: Diagnosis not present

## 2022-06-22 LAB — COMPREHENSIVE METABOLIC PANEL
ALT: 20 U/L (ref 0–44)
AST: 42 U/L — ABNORMAL HIGH (ref 15–41)
Albumin: 3.6 g/dL (ref 3.5–5.0)
Alkaline Phosphatase: 75 U/L (ref 38–126)
Anion gap: 20 — ABNORMAL HIGH (ref 5–15)
BUN: 15 mg/dL (ref 8–23)
CO2: 14 mmol/L — ABNORMAL LOW (ref 22–32)
Calcium: 9.1 mg/dL (ref 8.9–10.3)
Chloride: 97 mmol/L — ABNORMAL LOW (ref 98–111)
Creatinine, Ser: 1.27 mg/dL — ABNORMAL HIGH (ref 0.44–1.00)
GFR, Estimated: 41 mL/min — ABNORMAL LOW (ref >60)
Glucose, Bld: 150 mg/dL — ABNORMAL HIGH (ref 70–99)
Potassium: 4.3 mmol/L (ref 3.5–5.1)
Sodium: 131 mmol/L — ABNORMAL LOW (ref 135–145)
Total Bilirubin: 0.7 mg/dL (ref 0.3–1.2)
Total Protein: 7.3 g/dL (ref 6.5–8.1)

## 2022-06-22 LAB — BASIC METABOLIC PANEL
Anion gap: 19 — ABNORMAL HIGH (ref 5–15)
Anion gap: 15 (ref 5–15)
BUN: 12 mg/dL (ref 8–23)
BUN: 18 mg/dL (ref 8–23)
CO2: 16 mmol/L — ABNORMAL LOW (ref 22–32)
CO2: 19 mmol/L — ABNORMAL LOW (ref 22–32)
Calcium: 9.2 mg/dL (ref 8.9–10.3)
Calcium: 9.4 mg/dL (ref 8.9–10.3)
Chloride: 96 mmol/L — ABNORMAL LOW (ref 98–111)
Chloride: 100 mmol/L (ref 98–111)
Creatinine, Ser: 1.07 mg/dL — ABNORMAL HIGH (ref 0.44–1.00)
Creatinine, Ser: 1.3 mg/dL — ABNORMAL HIGH (ref 0.44–1.00)
GFR, Estimated: 51 mL/min — ABNORMAL LOW (ref >60)
GFR, Estimated: 40 mL/min — ABNORMAL LOW (ref >60)
Glucose, Bld: 138 mg/dL — ABNORMAL HIGH (ref 70–99)
Glucose, Bld: 215 mg/dL — ABNORMAL HIGH (ref 70–99)
Potassium: 3.9 mmol/L (ref 3.5–5.1)
Potassium: 3.5 mmol/L (ref 3.5–5.1)
Sodium: 131 mmol/L — ABNORMAL LOW (ref 135–145)
Sodium: 134 mmol/L — ABNORMAL LOW (ref 135–145)

## 2022-06-22 LAB — CBC
HCT: 39.8 % (ref 36.0–46.0)
Hemoglobin: 14.6 g/dL (ref 12.0–15.0)
MCH: 32.9 pg (ref 26.0–34.0)
MCHC: 36.7 g/dL — ABNORMAL HIGH (ref 30.0–36.0)
MCV: 89.6 fL (ref 80.0–100.0)
Platelets: 318 10*3/uL (ref 150–400)
RBC: 4.44 MIL/uL (ref 3.87–5.11)
RDW: 14.5 % (ref 11.5–15.5)
WBC: 3.9 10*3/uL — ABNORMAL LOW (ref 4.0–10.5)
nRBC: 0 % (ref 0.0–0.2)

## 2022-06-22 LAB — CK: Total CK: 317 U/L — ABNORMAL HIGH (ref 38–234)

## 2022-06-22 LAB — BETA-HYDROXYBUTYRIC ACID: Beta-Hydroxybutyric Acid: 2.05 mmol/L — ABNORMAL HIGH (ref 0.05–0.27)

## 2022-06-22 LAB — HEMOGLOBIN A1C
Hgb A1c MFr Bld: 5.7 % — ABNORMAL HIGH (ref 4.8–5.6)
Mean Plasma Glucose: 116.89 mg/dL

## 2022-06-22 LAB — SALICYLATE LEVEL: Salicylate Lvl: <7 mg/dL — ABNORMAL LOW (ref 7.0–30.0)

## 2022-06-22 LAB — LACTIC ACID, PLASMA
Lactic Acid, Venous: 1.5 mmol/L (ref 0.5–1.9)
Lactic Acid, Venous: 1.7 mmol/L (ref 0.5–1.9)

## 2022-06-22 LAB — OSMOLALITY: Osmolality: 291 mOsm/kg (ref 275–295)

## 2022-06-22 LAB — MAGNESIUM: Magnesium: 1.8 mg/dL (ref 1.7–2.4)

## 2022-06-22 LAB — ETHANOL: Alcohol, Ethyl (B): <10 mg/dL (ref <10)

## 2022-06-22 MED ORDER — POTASSIUM CHLORIDE CRYS ER 20 MEQ PO TBCR
40.0000 meq | EXTENDED_RELEASE_TABLET | Freq: Once | ORAL | Status: AC
  Administered 2022-06-22: 40 meq via ORAL
  Filled 2022-06-22: qty 2

## 2022-06-22 MED ORDER — ENSURE ENLIVE PO LIQD
237.0000 mL | Freq: Two times a day (BID) | ORAL | Status: DC
  Administered 2022-06-23: 237 mL via ORAL

## 2022-06-22 MED ORDER — MAGNESIUM SULFATE 2 GM/50ML IV SOLN
2.0000 g | Freq: Once | INTRAVENOUS | Status: AC
  Administered 2022-06-22: 2 g via INTRAVENOUS
  Filled 2022-06-22: qty 50

## 2022-06-22 MED ORDER — ADULT MULTIVITAMIN W/MINERALS CH
1.0000 | ORAL_TABLET | Freq: Every day | ORAL | Status: DC
  Administered 2022-06-22 – 2022-06-23 (×2): 1 via ORAL
  Filled 2022-06-22 (×2): qty 1

## 2022-06-22 NOTE — Progress Notes (Signed)
EEG complete - results pending 

## 2022-06-22 NOTE — Progress Notes (Signed)
PROGRESS NOTE    Adriana Jordan  VQQ:[id number] DOB: 14-Dec-1934 DOA: 06/21/2022 PCP: Dixie Dials, MD    Brief Narrative:  87 year old female with a history of pituitary adenoma status post radiation, adrenal sufficiency on Solu-Cortef, presents with generalized weakness and headache.  Headache has resolved since being in the hospital.  MRI brain unrevealing.  Seen by neurology with no further input recommended.  She did have EEG without any epileptiform discharges.  Other work-up did show high anion gap metabolic acidosis.  Further work-up underway.  Continue IV fluids.  PT/OT.   Assessment & Plan:   Principal Problem:   Headache Active Problems:   Pituitary adenoma (HCC)   Hypertension   Elevated CK   Hyponatremia   Generalized weakness   Headache -MRI brain unrevealing -Appears to have resolved with pain medication -Initially started on acyclovir due to concerns for HSV.  But with rapid improvement, unlikely this could be HSV.  Acyclovir discontinued. -Neurology input appreciated -EEG performed did not show any epileptiform discharges  High anion gap metabolic acidosis -Etiology is unclear, anion gap of 20 -She reports adequate p.o. intake without any vomiting or diarrhea -Other notes indicate that she may have had a recent viral syndrome which could be contributing to some degree of dehydration -She does not take excessive amounts of aspirin -She says she does drink a teaspoon of vinegar at night every day for muscle cramps -Check ABG to determine acid-base balance -Check lactic acid, beta hydroxybutyric acid, salicylate levels  Hyperglycemia -Fasting sugar noted to be elevated at 150 -Check A1c  Adrenal insufficiency -Reports compliance with home dose of cortisone -Follow-up outpatient endocrinology  Hyponatremia -Mild, appears to be relatively euvolemic -Continue on IV fluids for acidosis  Elevated CK -Appears to be a chronic finding -Significance  unclear  Generalized weakness -PT/OT  Muscle cramping -Potassium level normal, will check magnesium -May be related to acidosis   DVT prophylaxis: SCDs Start: 06/21/22 2035  Code Status: Full code Family Communication: No family present Disposition Plan: Status is: Observation The patient will require care spanning > 2 midnights and should be moved to inpatient because: Continued work-up of acidosis     Consultants:  Neurology  Procedures:  EEG  Antimicrobials:      Subjective: She says she is feeling better.  She has not gotten out of bed.  Headache has resolved.  Objective: Vitals:   06/22/22 0007 06/22/22 0431 06/22/22 0558 06/22/22 0824  BP: (!) 142/84 129/85  130/86  Pulse: (!) 103 99  97  Resp: 18   15  Temp: 97.7 F (36.5 C) (!) 97.5 F (36.4 C)  97.8 F (36.6 C)  TempSrc: Oral   Oral  SpO2:  94%    Weight: 68.4 kg     Height:   '5\' 6"'$  (1.676 m)     Intake/Output Summary (Last 24 hours) at 06/22/2022 1727 Last data filed at 06/22/2022 1606 Gross per 24 hour  Intake 1472.18 ml  Output 2000 ml  Net -527.82 ml   Filed Weights   06/21/22 2315 06/22/22 0007  Weight: 68.4 kg 68.4 kg    Examination:  General exam: Appears calm and comfortable  Respiratory system: Clear to auscultation. Respiratory effort normal. Cardiovascular system: S1 & S2 heard, RRR. No JVD, murmurs, rubs, gallops or clicks. No pedal edema. Gastrointestinal system: Abdomen is nondistended, soft and nontender. No organomegaly or masses felt. Normal bowel sounds heard. Central nervous system: Alert and oriented. No focal neurological deficits. Extremities: Symmetric 5 x  5 power. Skin: No rashes, lesions or ulcers Psychiatry: Judgement and insight appear normal. Mood & affect appropriate.     Data Reviewed: I have personally reviewed following labs and imaging studies  CBC: Recent Labs  Lab 06/21/22 1251 06/21/22 1420 06/22/22 0310  WBC 2.4*  --  3.9*  NEUTROABS 1.0*   --   --   HGB 15.6* 15.6* 14.6  HCT 46.7* 46.0 39.8  MCV 96.1  --  89.6  PLT 293  --  671   Basic Metabolic Panel: Recent Labs  Lab 06/21/22 1251 06/21/22 1420 06/22/22 0017 06/22/22 0539  NA 133* 126* 131* 131*  K 3.5 4.3 3.9 4.3  CL 94*  --  96* 97*  CO2 17*  --  16* 14*  GLUCOSE 76  --  138* 150*  BUN 14  --  12 15  CREATININE 1.01*  --  1.07* 1.27*  CALCIUM 9.7  --  9.2 9.1   GFR: Estimated Creatinine Clearance: 30.3 mL/min (A) (by C-G formula based on SCr of 1.27 mg/dL (H)). Liver Function Tests: Recent Labs  Lab 06/21/22 1251 06/22/22 0539  AST 47* 42*  ALT 25 20  ALKPHOS 81 75  BILITOT 0.8 0.7  PROT 8.2* 7.3  ALBUMIN 4.1 3.6   No results for input(s): "LIPASE", "AMYLASE" in the last 168 hours. No results for input(s): "AMMONIA" in the last 168 hours. Coagulation Profile: No results for input(s): "INR", "PROTIME" in the last 168 hours. Cardiac Enzymes: Recent Labs  Lab 06/21/22 1251  CKTOTAL 502*   BNP (last 3 results) No results for input(s): "PROBNP" in the last 8760 hours. HbA1C: No results for input(s): "HGBA1C" in the last 72 hours. CBG: No results for input(s): "GLUCAP" in the last 168 hours. Lipid Profile: No results for input(s): "CHOL", "HDL", "LDLCALC", "TRIG", "CHOLHDL", "LDLDIRECT" in the last 72 hours. Thyroid Function Tests: Recent Labs    06/21/22 1400  TSH 3.088   Anemia Panel: No results for input(s): "VITAMINB12", "FOLATE", "FERRITIN", "TIBC", "IRON", "RETICCTPCT" in the last 72 hours. Sepsis Labs: No results for input(s): "PROCALCITON", "LATICACIDVEN" in the last 168 hours.  Recent Results (from the past 240 hour(s))  SARS Coronavirus 2 by RT PCR (hospital order, performed in [facility] hospital lab) *cepheid single result test* Anterior Nasal Swab     Status: None   Collection Time: 06/21/22  2:14 PM   Specimen: Anterior Nasal Swab  Result Value Ref Range Status   SARS Coronavirus 2 by RT PCR NEGATIVE NEGATIVE Final     Comment: (NOTE) SARS-CoV-2 target nucleic acids are NOT DETECTED.  The SARS-CoV-2 RNA is generally detectable in upper and lower respiratory specimens during the acute phase of infection. The lowest concentration of SARS-CoV-2 viral copies this assay can detect is 250 copies / mL. A negative result does not preclude SARS-CoV-2 infection and should not be used as the sole basis for treatment or other patient management decisions.  A negative result may occur with improper specimen collection / handling, submission of specimen other than nasopharyngeal swab, presence of viral mutation(s) within the areas targeted by this assay, and inadequate number of viral copies (<250 copies / mL). A negative result must be combined with clinical observations, patient history, and epidemiological information.  Fact Sheet for Patients:   https://www.patel.info/  Fact Sheet for Healthcare Providers: https://hall.com/  This test is not yet approved or  cleared by the [place] FDA and has been authorized for detection and/or diagnosis of SARS-CoV-2 by FDA under  an Emergency Use Authorization (EUA).  This EUA will remain in effect (meaning this test can be used) for the duration of the COVID-19 declaration under Section 564(b)(1) of the Act, 21 U.S.C. section 360bbb-3(b)(1), unless the authorization is terminated or revoked sooner.  Performed at [facility], [place] [address], [place], [place] [address]   Resp Panel by RT-PCR (Flu A&B, Covid) Anterior Nasal Swab     Status: None   Collection Time: 06/21/22  2:14 PM   Specimen: Anterior Nasal Swab  Result Value Ref Range Status   SARS Coronavirus 2 by RT PCR NEGATIVE NEGATIVE Final    Comment: (NOTE) SARS-CoV-2 target nucleic acids are NOT DETECTED.  The SARS-CoV-2 RNA is generally detectable in upper respiratory specimens during the acute phase of infection. The lowest concentration of  SARS-CoV-2 viral copies this assay can detect is 138 copies/mL. A negative result does not preclude SARS-Cov-2 infection and should not be used as the sole basis for treatment or other patient management decisions. A negative result may occur with  improper specimen collection/handling, submission of specimen other than nasopharyngeal swab, presence of viral mutation(s) within the areas targeted by this assay, and inadequate number of viral copies(<138 copies/mL). A negative result must be combined with clinical observations, patient history, and epidemiological information. The expected result is Negative.  Fact Sheet for Patients:  EntrepreneurPulse.com.au  Fact Sheet for Healthcare Providers:  IncredibleEmployment.be  This test is no t yet approved or cleared by the [place] FDA and  has been authorized for detection and/or diagnosis of SARS-CoV-2 by FDA under an Emergency Use Authorization (EUA). This EUA will remain  in effect (meaning this test can be used) for the duration of the COVID-19 declaration under Section 564(b)(1) of the Act, 21 U.S.C.section 360bbb-3(b)(1), unless the authorization is terminated  or revoked sooner.       Influenza A by PCR NEGATIVE NEGATIVE Final   Influenza B by PCR NEGATIVE NEGATIVE Final    Comment: (NOTE) The Xpert Xpress SARS-CoV-2/FLU/RSV plus assay is intended as an aid in the diagnosis of influenza from Nasopharyngeal swab specimens and should not be used as a sole basis for treatment. Nasal washings and aspirates are unacceptable for Xpert Xpress SARS-CoV-2/FLU/RSV testing.  Fact Sheet for Patients: EntrepreneurPulse.com.au  Fact Sheet for Healthcare Providers: IncredibleEmployment.be  This test is not yet approved or cleared by the [place] FDA and has been authorized for detection and/or diagnosis of SARS-CoV-2 by FDA under an Emergency Use  Authorization (EUA). This EUA will remain in effect (meaning this test can be used) for the duration of the COVID-19 declaration under Section 564(b)(1) of the Act, 21 U.S.C. section 360bbb-3(b)(1), unless the authorization is terminated or revoked.  Performed at [facility], [place] [employer] [address] [place], [place] [address]          Radiology Studies: EEG adult  Result Date: 2022-07-05 Adriana Havens, MD     05-Jul-2022 12:19 PM Patient Name: Adriana Jordan MRN: [id number] Epilepsy Attending: Lora Jordan Referring Physician/Provider: Lorenza Chick, MD Date: 07/05/22 Duration: 23.39 mins Patient history: 87 year old female with altered mental status.  EEG to evaluate for seizure. Level of alertness: Awake, drowsy AEDs during EEG study: None Technical aspects: This EEG study was done with scalp electrodes positioned according to the 10-20 International system of electrode placement. Electrical activity was reviewed with band pass filter of 1-'70Hz'$ , sensitivity of 7 uV/mm, display speed of 43m/sec with a '60Hz'$  notched filter applied as  appropriate. EEG data were recorded continuously and digitally stored.  Video monitoring was available and reviewed as appropriate. Description: The posterior dominant rhythm consists of8 Hz activity of moderate voltage (25-35 uV) seen predominantly in posterior head regions, symmetric and reactive to eye opening and eye closing. Drowsiness was characterized by attenuation of the posterior background rhythm.   Hyperventilation and photic stimulation were not performed.   IMPRESSION: This study is within normal limits. No seizures or epileptiform discharges were seen throughout the recording. Adriana Jordan   MR BRAIN WO CONTRAST  Result Date: 06/21/2022 CLINICAL DATA:  Neuro deficit, acute, stroke suspected. Generalized weakness and dizziness. EXAM: MRI HEAD WITHOUT CONTRAST TECHNIQUE: Multiplanar, multiecho pulse sequences of the brain and  surrounding structures were obtained without intravenous contrast. COMPARISON:  CT head without contrast 06/21/2022. MR head without and with contrast 03/25/2022 FINDINGS: Brain: No acute infarct, hemorrhage, or mass lesion is present. Remote lacunar infarcts are again noted within the cerebellum bilaterally. Moderate periventricular and subcortical T2 hyperintensities bilaterally are stable. The ventricles are proportionate to the degree of atrophy. No significant extraaxial fluid collection is present. A remote lacunar infarct is present the posterior left lentiform nucleus. Basal ganglia are otherwise within normal limits. Brainstem is within normal limits. Cerebellum is otherwise unremarkable. The internal auditory canals are within normal limits. Vascular: Flow is present in the major intracranial arteries. Skull and upper cervical spine: Cervical spine fusion again noted. Craniocervical junction is within normal limits. Upper cervical spine otherwise normal. Marrow signal is normal. Sinuses/Orbits: The paranasal sinuses and mastoid air cells are clear. The globes and orbits are within normal limits. IMPRESSION: 1. No acute intracranial abnormality or significant interval change. 2. Stable atrophy and white matter disease likely reflects the sequela of chronic microvascular ischemia. Electronically Signed   By: San Morelle M.D.   On: 06/21/2022 19:12   CT Head Wo Contrast  Result Date: 06/21/2022 CLINICAL DATA:  Headache, new onset.  Malaise for 4 days. EXAM: CT HEAD WITHOUT CONTRAST TECHNIQUE: Contiguous axial images were obtained from the base of the skull through the vertex without intravenous contrast. RADIATION DOSE REDUCTION: This exam was performed according to the departmental dose-optimization program which includes automated exposure control, adjustment of the mA and/or kV according to patient size and/or use of iterative reconstruction technique. COMPARISON:  CT head without contrast  03/01/2022. MRI head without contrast 03/25/2022. FINDINGS: Brain: Mild generalized atrophy and moderate diffuse white matter disease is similar the prior studies. No acute infarct, hemorrhage, or mass lesion is present. Basal ganglia are intact. The ventricles are proportionate to the degree of atrophy. No significant extraaxial fluid collection is present. The brainstem and cerebellum are within normal limits. Vascular: Atherosclerotic calcifications are present within the cavernous internal carotid arteries bilaterally. No hyperdense vessel is present. Skull: Calvarium is intact. No focal lytic or blastic lesions are present. No significant extracranial soft tissue lesion is present. Sinuses/Orbits: Chronic opacification of the right sphenoid sinus is present. Minimal mucosal thickening is present the left sphenoid sinus. The paranasal sinuses and mastoid air cells are otherwise clear. The globes and orbits are within normal limits. IMPRESSION: 1. No acute intracranial abnormality or significant interval change. 2. Stable atrophy and moderate white matter disease. This likely reflects the sequela of chronic microvascular ischemia. 3. Chronic opacification of the right sphenoid sinus. Electronically Signed   By: San Morelle M.D.   On: 06/21/2022 14:57        Scheduled Meds:  [START ON 06/23/2022] feeding supplement  237 mL Oral BID BM   hydrocortisone  10 mg Oral QPM   hydrocortisone  20 mg Oral [id number]   multivitamin with minerals  1 tablet Oral Daily   Continuous Infusions:  lactated ringers 75 mL/hr at 06/22/22 1614     LOS: 0 days    Time spent: 67mns    JKathie Dike MD [facility]   If 7PM-7AM, please contact night-coverage [email or web address]  06/22/2022, 5:27 PM

## 2022-06-22 NOTE — Plan of Care (Signed)
I was asked to follow EEG by Dr. Curly Shores. EEG normal. Recommendations as in the consultative note Please call with questions as needed Plan discussed with Dr. Roderic Palau  -- Amie Portland, MD Neurologist [facility] Pager: [phone]

## 2022-06-22 NOTE — Progress Notes (Signed)
Initial Nutrition Assessment  DOCUMENTATION CODES:   Non-severe (moderate) malnutrition in context of acute illness/injury  INTERVENTION:  - Add Ensure Enlive po BID, each supplement provides 350 kcal and 20 grams of protein.  - Add MVI q day.   NUTRITION DIAGNOSIS:   Moderate Malnutrition related to acute illness as evidenced by energy intake < 75% for > 7 days, mild muscle depletion, mild fat depletion.  GOAL:   Patient will meet greater than or equal to 90% of their needs  MONITOR:   PO intake, Supplement acceptance  REASON FOR ASSESSMENT:   Malnutrition Screening Tool    ASSESSMENT:   87 y.o. female admits related to weakness. PMH includes: pituitary edema, CAD, GERD, hypercholesteremia, HTN, MI. Pt is currently receiving medical management for headache and generalized weakness.  Meds reviewed. IVF: LR @ 75 mL/hr. Labs reviewed: Na low.   The pt reports that she has been eating better today. She states that she has not eaten anything over the past 4 days. No significant wt loss per record. Per record, pt ate 50-70% of her meals today. Pt would like Ensure BID in between meals. RD will add supplements and continue to monitor PO intakes.   NUTRITION - FOCUSED PHYSICAL EXAM:  Flowsheet Row Most Recent Value  Orbital Region No depletion  Upper Arm Region Mild depletion  Thoracic and Lumbar Region Unable to assess  Buccal Region Mild depletion  Temple Region Mild depletion  Clavicle Bone Region Mild depletion  Clavicle and Acromion Bone Region Unable to assess  Scapular Bone Region Unable to assess  Dorsal Hand Mild depletion  Patellar Region No depletion  Anterior Thigh Region No depletion  Posterior Calf Region No depletion  Edema (RD Assessment) None  Hair Reviewed  Eyes Reviewed  Mouth Reviewed  Skin Reviewed  Nails Reviewed       Diet Order:   Diet Order             Diet regular Room service appropriate? Yes; Fluid consistency: Thin  Diet effective  now                   EDUCATION NEEDS:   Education needs have been addressed  Skin:  Skin Assessment: Reviewed RN Assessment  Last BM:  unknown  Height:   Ht Readings from Last 1 Encounters:  06/22/22 '5\' 6"'$  (1.676 m)    Weight:   Wt Readings from Last 1 Encounters:  06/22/22 68.4 kg    Ideal Body Weight:  59.1 kg  BMI:  Body mass index is 24.34 kg/m.  Estimated Nutritional Needs:   Kcal:  1610-9604 kcals  Protein:  85-100 gm  Fluid:  >/= 1.7 L  Adriana Jordan, RD, LDN, CNSC.

## 2022-06-22 NOTE — Care Management Obs Status (Cosign Needed)
[facility] NOTIFICATION   Patient Details  Name: LORANDA MASTEL MRN: [id number] Date of Birth: 06/22/1935   Medicare Observation Status Notification Given:  Yes    Curlene Labrum, RN 06/22/2022, 3:05 PM

## 2022-06-22 NOTE — Procedures (Signed)
Patient Name: Adriana Jordan  MRN: [id number]  Epilepsy Attending: Lora Havens  Referring Physician/Provider: Lorenza Chick, MD  Date: 06/22/2022 Duration: 23.39 mins  Patient history: 87 year old female with altered mental status.  EEG to evaluate for seizure.  Level of alertness: Awake, drowsy  AEDs during EEG study: None  Technical aspects: This EEG study was done with scalp electrodes positioned according to the 10-20 International system of electrode placement. Electrical activity was reviewed with band pass filter of 1-'70Hz'$ , sensitivity of 7 uV/mm, display speed of 60m/sec with a '60Hz'$  notched filter applied as appropriate. EEG data were recorded continuously and digitally stored.  Video monitoring was available and reviewed as appropriate.  Description: The posterior dominant rhythm consists of8 Hz activity of moderate voltage (25-35 uV) seen predominantly in posterior head regions, symmetric and reactive to eye opening and eye closing. Drowsiness was characterized by attenuation of the posterior background rhythm.   Hyperventilation and photic stimulation were not performed.     IMPRESSION: This study is within normal limits. No seizures or epileptiform discharges were seen throughout the recording.  [name] OBarbra Sarks

## 2022-06-22 NOTE — TOC Initial Note (Signed)
[facility] - Initial/Assessment Note    Patient Details  Name: Adriana Jordan MRN: [id number] Date of Birth: [birth year]  [facility]) CM/SW Contact:    Curlene Labrum, RN Phone Number: 06/22/2022, 3:17 PM  Clinical Narrative:                 CM met with the patient at the bedside - patient admitted for generalized weakness and headaches.  The patient was given a Medicare observation notice at the bedside.  The patient plans to return home when discharge - pending later date.  No TOC needs determined at this time.  Expected Discharge Plan: Home/Self Care Barriers to Discharge: Continued Medical Work up   Patient Goals and CMS Choice Patient states their goals for this hospitalization and ongoing recovery are:: To return home CMS Medicare.gov Compare Post Acute Care list provided to:: Patient Choice offered to / list presented to : Patient  Expected Discharge Plan and Services Expected Discharge Plan: Home/Self Care       Living arrangements for the past 2 months: Single Family Home                                      Prior Living Arrangements/Services Living arrangements for the past 2 months: Single Family Home Lives with:: Adult Children                   Activities of Daily Living Home Assistive Devices/Equipment: Dentures (specify type) ADL Screening (condition at time of admission) Patient's cognitive ability adequate to safely complete daily activities?: Yes Is the patient deaf or have difficulty hearing?: No Does the patient have difficulty seeing, even when wearing glasses/contacts?: No Does the patient have difficulty concentrating, remembering, or making decisions?: No Patient able to express need for assistance with ADLs?: No Does the patient have difficulty dressing or bathing?: No Independently performs ADLs?: Yes (appropriate for developmental age) Does the patient have difficulty walking or climbing  stairs?: No Weakness of Legs: Left Weakness of Arms/Hands: None  Permission Sought/Granted                  Emotional Assessment              Admission diagnosis:  Headache [R51.9] Patient Active Problem List   Diagnosis Date Noted   Headache 06/21/2022   Adrenal insufficiency ([place]) 03/05/2022   Protein-calorie malnutrition, severe 03/03/2022   Hypokalemia 03/01/2022   FTT (failure to thrive) in adult 03/01/2022   Generalized weakness 03/01/2022   Protein-calorie malnutrition, moderate ([place]) 03/01/2022   Multiple falls 03/01/2022   Tick bite 03/01/2022   Hyponatremia 02/21/2022   Chest pain, atypical 09/11/2021   Stroke ([place]) 10/11/2019   Leukopenia 11/15/2017   Acute respiratory failure with hypoxia ([place]) 11/15/2017   AKI (acute kidney injury) ([place]) 11/15/2017   Elevated CK 11/15/2017   Influenza A 11/14/2017   Hypertension 11/14/2017   Hyperlipidemia 11/14/2017   GERD (gastroesophageal reflux disease) 11/14/2017   Adenoma 05/21/2015   Pituitary tumor ([place]) 05/21/2015   Pituitary adenoma ([place]) 04/07/2015   PCP:  Dixie Dials, MD Pharmacy:   [facility], [place] [address] Phone: [phone] Fax: [phone]  [facility] [address] [place] [address] Phone: [phone] Fax: [phone]     Social Determinants of Health (  SDOH) Interventions    Readmission Risk Interventions     No data to display

## 2022-06-23 DIAGNOSIS — E871 Hypo-osmolality and hyponatremia: Secondary | ICD-10-CM | POA: Diagnosis not present

## 2022-06-23 DIAGNOSIS — E8729 Other acidosis: Secondary | ICD-10-CM | POA: Diagnosis not present

## 2022-06-23 DIAGNOSIS — N179 Acute kidney failure, unspecified: Secondary | ICD-10-CM | POA: Diagnosis not present

## 2022-06-23 DIAGNOSIS — R519 Headache, unspecified: Secondary | ICD-10-CM | POA: Diagnosis not present

## 2022-06-23 LAB — COMPREHENSIVE METABOLIC PANEL
ALT: 21 U/L (ref 0–44)
AST: 28 U/L (ref 15–41)
Albumin: 3.3 g/dL — ABNORMAL LOW (ref 3.5–5.0)
Alkaline Phosphatase: 65 U/L (ref 38–126)
Anion gap: 9 (ref 5–15)
BUN: 16 mg/dL (ref 8–23)
CO2: 22 mmol/L (ref 22–32)
Calcium: 9.4 mg/dL (ref 8.9–10.3)
Chloride: 107 mmol/L (ref 98–111)
Creatinine, Ser: 1.09 mg/dL — ABNORMAL HIGH (ref 0.44–1.00)
GFR, Estimated: 50 mL/min — ABNORMAL LOW (ref >60)
Glucose, Bld: 155 mg/dL — ABNORMAL HIGH (ref 70–99)
Potassium: 4.3 mmol/L (ref 3.5–5.1)
Sodium: 138 mmol/L (ref 135–145)
Total Bilirubin: 0.5 mg/dL (ref 0.3–1.2)
Total Protein: 6.5 g/dL (ref 6.5–8.1)

## 2022-06-23 LAB — SEDIMENTATION RATE: Sed Rate: 50 mm/hr — ABNORMAL HIGH (ref 0–22)

## 2022-06-23 LAB — BETA-HYDROXYBUTYRIC ACID: Beta-Hydroxybutyric Acid: 0.46 mmol/L — ABNORMAL HIGH (ref 0.05–0.27)

## 2022-06-23 MED ORDER — ENSURE ENLIVE PO LIQD: 237.0000 mL | Freq: Two times a day (BID) | ORAL | 12 refills | Status: DC

## 2022-06-23 MED ORDER — HYDROCORTISONE 10 MG PO TABS: 10.0000 mg | ORAL_TABLET | Freq: Every evening | ORAL | Status: DC

## 2022-06-23 NOTE — TOC Transition Note (Signed)
Transition of Care [facility]) - CM/SW Discharge Note   Patient Details  Name: Adriana Jordan MRN: [id number] Date of Birth: [birth year]  [facility]) CM/SW Contact:  Curlene Labrum, RN Phone Number: 06/23/2022, 10:09 AM   Clinical Narrative:    CM met with the patient at the bedside to discuss transitions of care to home today, once family has been confirmed to provide 24 hour support in the home.  I met with the patient and she states that she lives with her son, Jiles Prows, who is recovering from knee surgery from last week.  The patient has a retired niece, Charleston Ropes, that lives next door and plans to provide 24 hour supervision at the home and assistance with errands / transportation.  PT/OT eval completed this morning and home health is recommended.  I gave the patient Medicare choice regarding home health services and she did not have a preference.  I called Tommi Rumps, CM with [facility] and he accepted patient for services.  Home health orders placed to be signed by attending MD.  Transportation - Bedside nursing to call the patient's son and arrange to have family - Colletta [place] pick the patient up by car when ready for discharge.  No DMe recommended.  PCP - Patient's son plans to schedule hospital follow up with the patient PCP in the next week.  Patient will be discharged home today - no further needs.   Final next level of care: Home/Self Care Barriers to Discharge: Continued Medical Work up   Patient Goals and CMS Choice Patient states their goals for this hospitalization and ongoing recovery are:: To return home CMS Medicare.gov Compare Post Acute Care list provided to:: Patient Choice offered to / list presented to : Patient  Discharge Placement                       Discharge Plan and Services                                     Social Determinants of Health (SDOH) Interventions     Readmission Risk Interventions     No data to display

## 2022-06-23 NOTE — Plan of Care (Signed)

## 2022-06-23 NOTE — Discharge Summary (Signed)
Physician Discharge Summary  Adriana Jordan:[id number] DOB: 01-16-1935  PCP: Dixie Dials, MD  Admitted from: Home Discharged to: Home  Admit date: 06/21/2022 Discharge date: 06/23/2022  Recommendations for Outpatient Follow-up:    Follow-up Information     Dixie Dials, MD. Schedule an appointment as soon as possible for a visit in 1 week(s).   Specialty: Cardiology Why: To be seen with repeat labs (CBC & BMP). Contact information: [place] [address] [phone]         Care, [facility] Follow up.   Specialty: Home Health Services Why: [facility] will be providing home health services for physical therapy and occupational therapy.  They will call you in the next 24-48 hours to set up therapy times at the home. Contact information: [place] [address] [phone]                  Home Health: [place] Orders (From admission, onward)     Start     Ordered   06/23/22 [place]  At discharge       Question Answer Comment  To provide the following care/treatments PT   To provide the following care/treatments OT      06/23/22 1004             Equipment/Devices: None    Discharge Condition: Improved and stable   Code Status: Full Code Diet recommendation:  Discharge Diet Orders (From admission, onward)     Start     Ordered   06/23/22 0000  Diet - low sodium heart healthy        06/23/22 1213             Discharge Diagnoses:  Principal Problem:   Headache Active Problems:   Pituitary adenoma ([place])   Hypertension   Elevated CK   Hyponatremia   Generalized weakness   Brief Summary: 87 year old female, son lives with her, 4-5 nieces live within walking distance from her home, independent and active, with a history of pituitary adenoma status post radiation, adrenal sufficiency on Solu-Cortef, presents with generalized weakness and headache.  Headache has  resolved since being in the hospital.  MRI brain unrevealing.  Seen by neurology with no further input recommended.  She did have EEG without any epileptiform discharges.  Other work-up did show high anion gap metabolic acidosis.    Assessment & Plan:  Headache -MRI brain unrevealing -Appears to have resolved with pain medication without recurrence -Initially started on acyclovir due to concerns for HSV.  But with rapid improvement, unlikely this could be HSV.  Acyclovir discontinued. -Neurology input appreciated.  Neurology signoff note from 11/13 appreciated.  They felt that her mild CK elevation was chronic.  They also felt that her mild intermittent confusion may be related to primarily to age and her mild chronic microvascular changes on MRI.  They did recommend outpatient neurocognitive testing and will defer to her PCP for outpatient neurology consultation and follow-up. -EEG performed did not show any epileptiform discharges -For completion, obtained ESR which was mildly elevated at 50 but not in keeping with giant cell arteritis.   High anion gap metabolic acidosis -Etiology is unclear, anion gap of 20 and bicarbonate as low as 14 yesterday. -She reports adequate p.o. intake without any vomiting or diarrhea -Other notes indicate that she may have had a recent viral syndrome which could be contributing to some degree of dehydration -She does not  take excessive amounts of aspirin -She says she does drink a teaspoon of vinegar at night every day for muscle cramps -VBG showed normal pH without CO2 retention -Lactate was trended and normal.  Beta hydroxybutyrate mildly elevated but trending down towards normal.  Serum salicylate level checked and negative.   -Post IV fluid hydration, acidosis has resolved.  Bicarbonate has normalized.  Acute kidney injury: Baseline creatinine in the 0.8-0.9 range in July 2023.  Presented with creatinine of 1.01 which peaked to 1.3.  Likely prerenal due to  dehydration with hyponatremia.  Creatinine has improved to 1.09 on day of discharge and approaching her baseline.  Avoid nephrotoxic's and follow BMP closely as outpatient.   Hyperglycemia/prediabetes -Fasting sugar noted to be elevated at 150 -A1c: 5.7.  Periodic follow-up of A1c as outpatient.   Adrenal insufficiency, iatrogenic -Reports compliance with home dose of cortisone which she takes 20 mg in the morning and 10 mg in the evening -Follow-up outpatient endocrinology   Hyponatremia -Mild, appears to be relatively euvolemic -Treated with IV fluids and has resolved.  Serum osmolarity normal.   Elevated CK -Appears to be a chronic finding -Significance unclear.  CK down from 502 > 317.  Periodic outpatient follow-up as deemed necessary.   Generalized weakness -PT/OT evaluated and recommended home health services.  As per my discussion with patient's son and GOC discussion with patient's niece, apart from her son who recently had a knee replacement, one of the nieces will be staying with patient 24/7 for the next several days to provide assistance and supervision.  Patient declined SNF even if indicated.   Muscle cramping -Potassium and magnesium normal.  Resolved.    Consultations: Neurology  Procedures: None   Discharge Instructions  Discharge Instructions     Call MD for:  difficulty breathing, headache or visual disturbances   Complete by: As directed    Call MD for:  extreme fatigue   Complete by: As directed    Call MD for:  persistant dizziness or light-headedness   Complete by: As directed    Call MD for:  persistant nausea and vomiting   Complete by: As directed    Call MD for:  severe uncontrolled pain   Complete by: As directed    Call MD for:  temperature >100.4   Complete by: As directed    Diet - low sodium heart healthy   Complete by: As directed    Increase activity slowly   Complete by: As directed         Medication List     TAKE these  medications    feeding supplement Liqd Take 237 mLs by mouth 2 (two) times daily between meals.   hydrocortisone 20 MG tablet Commonly known as: CORTEF Take 1 tablet (20 mg total) by mouth every morning.   hydrocortisone 10 MG tablet Commonly known as: CORTEF Take 1 tablet (10 mg total) by mouth every evening.   PreserVision AREDS 2 Caps Take 1 capsule by mouth every morning.       Allergies  Allergen Reactions   Codeine Nausea And Vomiting    Tolerates hydrocodone      Procedures/Studies: EEG adult  Result Date: 10-Jul-2022 Lora Havens, MD     10-Jul-2022 12:19 PM Patient Name: Adriana Jordan MRN: [id number] Epilepsy Attending: Lora Havens Referring Physician/Provider: Lorenza Chick, MD Date: 10-Jul-2022 Duration: 23.39 mins Patient history: 87 year old female with altered mental status.  EEG to evaluate for seizure. Level of alertness: Awake,  drowsy AEDs during EEG study: None Technical aspects: This EEG study was done with scalp electrodes positioned according to the 10-20 International system of electrode placement. Electrical activity was reviewed with band pass filter of 1-_0 , sensitivity of 7 uV/mm, display speed of 27m/sec with a _1  notched filter applied as appropriate. EEG data were recorded continuously and digitally stored.  Video monitoring was available and reviewed as appropriate. Description: The posterior dominant rhythm consists of8 Hz activity of moderate voltage (25-35 uV) seen predominantly in posterior head regions, symmetric and reactive to eye opening and eye closing. Drowsiness was characterized by attenuation of the posterior background rhythm.   Hyperventilation and photic stimulation were not performed.   IMPRESSION: This study is within normal limits. No seizures or epileptiform discharges were seen throughout the recording. PLora Havens  MR BRAIN WO CONTRAST  Result Date: 06/21/2022 CLINICAL DATA:  Neuro deficit, acute, stroke  suspected. Generalized weakness and dizziness. EXAM: MRI HEAD WITHOUT CONTRAST TECHNIQUE: Multiplanar, multiecho pulse sequences of the brain and surrounding structures were obtained without intravenous contrast. COMPARISON:  CT head without contrast 06/21/2022. MR head without and with contrast 03/25/2022 FINDINGS: Brain: No acute infarct, hemorrhage, or mass lesion is present. Remote lacunar infarcts are again noted within the cerebellum bilaterally. Moderate periventricular and subcortical T2 hyperintensities bilaterally are stable. The ventricles are proportionate to the degree of atrophy. No significant extraaxial fluid collection is present. A remote lacunar infarct is present the posterior left lentiform nucleus. Basal ganglia are otherwise within normal limits. Brainstem is within normal limits. Cerebellum is otherwise unremarkable. The internal auditory canals are within normal limits. Vascular: Flow is present in the major intracranial arteries. Skull and upper cervical spine: Cervical spine fusion again noted. Craniocervical junction is within normal limits. Upper cervical spine otherwise normal. Marrow signal is normal. Sinuses/Orbits: The paranasal sinuses and mastoid air cells are clear. The globes and orbits are within normal limits. IMPRESSION: 1. No acute intracranial abnormality or significant interval change. 2. Stable atrophy and white matter disease likely reflects the sequela of chronic microvascular ischemia. Electronically Signed   By: CSan MorelleM.D.   On: 06/21/2022 19:12   CT Head Wo Contrast  Result Date: 06/21/2022 CLINICAL DATA:  Headache, new onset.  Malaise for 4 days. EXAM: CT HEAD WITHOUT CONTRAST TECHNIQUE: Contiguous axial images were obtained from the base of the skull through the vertex without intravenous contrast. RADIATION DOSE REDUCTION: This exam was performed according to the departmental dose-optimization program which includes automated exposure control,  adjustment of the mA and/or kV according to patient size and/or use of iterative reconstruction technique. COMPARISON:  CT head without contrast 03/01/2022. MRI head without contrast 03/25/2022. FINDINGS: Brain: Mild generalized atrophy and moderate diffuse white matter disease is similar the prior studies. No acute infarct, hemorrhage, or mass lesion is present. Basal ganglia are intact. The ventricles are proportionate to the degree of atrophy. No significant extraaxial fluid collection is present. The brainstem and cerebellum are within normal limits. Vascular: Atherosclerotic calcifications are present within the cavernous internal carotid arteries bilaterally. No hyperdense vessel is present. Skull: Calvarium is intact. No focal lytic or blastic lesions are present. No significant extracranial soft tissue lesion is present. Sinuses/Orbits: Chronic opacification of the right sphenoid sinus is present. Minimal mucosal thickening is present the left sphenoid sinus. The paranasal sinuses and mastoid air cells are otherwise clear. The globes and orbits are within normal limits. IMPRESSION: 1. No acute intracranial abnormality or significant interval change. 2. Stable atrophy  and moderate white matter disease. This likely reflects the sequela of chronic microvascular ischemia. 3. Chronic opacification of the right sphenoid sinus. Electronically Signed   By: San Morelle M.D.   On: 06/21/2022 14:57      Subjective: Patient sitting up in reclining chair.  Alert and oriented x4.  Denies complaints.  No headache, dizziness, lightheadedness or any other symptoms.  Discharge Exam:  Vitals:   06/22/22 0558 06/22/22 0824 06/22/22 1924 06/23/22 0459  BP:  130/86 (!) 144/85 (!) 151/83  Pulse:  97 (!) 104 93  Resp:  _0 Temp:  97.8 F (36.6 C) 98.4 F (36.9 C) 98.4 F (36.9 C)  TempSrc:  Oral Oral   SpO2:   93% 92%  Weight:      Height: _1  (1.676 m)       General: Elderly female,  moderately built and nourished sitting up comfortably in chair without distress. Cardiovascular: S1 & S2 heard, RRR, S1/S2 +. No murmurs, rubs, gallops or clicks. No JVD or pedal edema. Respiratory: Clear to auscultation without wheezing, rhonchi or crackles. No increased work of breathing. Abdominal:  Non distended, non tender & soft. No organomegaly or masses appreciated. Normal bowel sounds heard. CNS: Alert and oriented. No focal deficits. Extremities: no edema, no cyanosis    The results of significant diagnostics from this hospitalization (including imaging, microbiology, ancillary and laboratory) are listed below for reference.     Microbiology: Recent Results (from the past 240 hour(s))  SARS Coronavirus 2 by RT PCR (hospital order, performed in [facility] hospital lab) *cepheid single result test* Anterior Nasal Swab     Status: None   Collection Time: 06/21/22  2:14 PM   Specimen: Anterior Nasal Swab  Result Value Ref Range Status   SARS Coronavirus 2 by RT PCR NEGATIVE NEGATIVE Final    Comment: (NOTE) SARS-CoV-2 target nucleic acids are NOT DETECTED.  The SARS-CoV-2 RNA is generally detectable in upper and lower respiratory specimens during the acute phase of infection. The lowest concentration of SARS-CoV-2 viral copies this assay can detect is 250 copies / mL. A negative result does not preclude SARS-CoV-2 infection and should not be used as the sole basis for treatment or other patient management decisions.  A negative result may occur with improper specimen collection / handling, submission of specimen other than nasopharyngeal swab, presence of viral mutation(s) within the areas targeted by this assay, and inadequate number of viral copies (<250 copies / mL). A negative result must be combined with clinical observations, patient history, and epidemiological information.  Fact Sheet for Patients:   https://www.patel.info/  Fact Sheet for  Healthcare Providers: https://hall.com/  This test is not yet approved or  cleared by the [place] FDA and has been authorized for detection and/or diagnosis of SARS-CoV-2 by FDA under an Emergency Use Authorization (EUA).  This EUA will remain in effect (meaning this test can be used) for the duration of the COVID-19 declaration under Section 564(b)(1) of the Act, 21 U.S.C. section 360bbb-3(b)(1), unless the authorization is terminated or revoked sooner.  Performed at [facility], [place] [address] [place], [place] [address]   Resp Panel by RT-PCR (Flu A&B, Covid) Anterior Nasal Swab     Status: None   Collection Time: 06/21/22  2:14 PM   Specimen: Anterior Nasal Swab  Result Value Ref Range Status   SARS Coronavirus 2 by RT PCR NEGATIVE NEGATIVE Final    Comment: (NOTE) SARS-CoV-2 target nucleic acids  are NOT DETECTED.  The SARS-CoV-2 RNA is generally detectable in upper respiratory specimens during the acute phase of infection. The lowest concentration of SARS-CoV-2 viral copies this assay can detect is 138 copies/mL. A negative result does not preclude SARS-Cov-2 infection and should not be used as the sole basis for treatment or other patient management decisions. A negative result may occur with  improper specimen collection/handling, submission of specimen other than nasopharyngeal swab, presence of viral mutation(s) within the areas targeted by this assay, and inadequate number of viral copies(<138 copies/mL). A negative result must be combined with clinical observations, patient history, and epidemiological information. The expected result is Negative.  Fact Sheet for Patients:  EntrepreneurPulse.com.au  Fact Sheet for Healthcare Providers:  IncredibleEmployment.be  This test is no t yet approved or cleared by the [place] FDA and  has been authorized for detection and/or diagnosis of  SARS-CoV-2 by FDA under an Emergency Use Authorization (EUA). This EUA will remain  in effect (meaning this test can be used) for the duration of the COVID-19 declaration under Section 564(b)(1) of the Act, 21 U.S.C.section 360bbb-3(b)(1), unless the authorization is terminated  or revoked sooner.       Influenza A by PCR NEGATIVE NEGATIVE Final   Influenza B by PCR NEGATIVE NEGATIVE Final    Comment: (NOTE) The Xpert Xpress SARS-CoV-2/FLU/RSV plus assay is intended as an aid in the diagnosis of influenza from Nasopharyngeal swab specimens and should not be used as a sole basis for treatment. Nasal washings and aspirates are unacceptable for Xpert Xpress SARS-CoV-2/FLU/RSV testing.  Fact Sheet for Patients: EntrepreneurPulse.com.au  Fact Sheet for Healthcare Providers: IncredibleEmployment.be  This test is not yet approved or cleared by the [place] FDA and has been authorized for detection and/or diagnosis of SARS-CoV-2 by FDA under an Emergency Use Authorization (EUA). This EUA will remain in effect (meaning this test can be used) for the duration of the COVID-19 declaration under Section 564(b)(1) of the Act, 21 U.S.C. section 360bbb-3(b)(1), unless the authorization is terminated or revoked.  Performed at [facility], [place] [address], [place], [place] [address]      Labs: CBC: Recent Labs  Lab 06/21/22 1251 06/21/22 1420 06/22/22 0310  WBC 2.4*  --  3.9*  NEUTROABS 1.0*  --   --   HGB 15.6* 15.6* 14.6  HCT 46.7* 46.0 39.8  MCV 96.1  --  89.6  PLT 293  --  790    Basic Metabolic Panel: Recent Labs  Lab 06/21/22 1251 06/21/22 1420 06/22/22 0017 06/22/22 0539 06/22/22 1759 06/23/22 0555  NA 133* 126* 131* 131* 134* 138  K 3.5 4.3 3.9 4.3 3.5 4.3  CL 94*  --  96* 97* 100 107  CO2 17*  --  16* 14* 19* 22  GLUCOSE 76  --  138* 150* 215* 155*  BUN 14  --  _0 CREATININE 1.01*  --  1.07* 1.27* 1.30*  1.09*  CALCIUM 9.7  --  9.2 9.1 9.4 9.4  MG  --   --   --   --  1.8  --     Liver Function Tests: Recent Labs  Lab 06/21/22 1251 06/22/22 0539 06/23/22 0555  AST 47* 42* 28  ALT _1 ALKPHOS 81 75 65  BILITOT 0.8 0.7 0.5  PROT 8.2* 7.3 6.5  ALBUMIN 4.1 3.6 3.3*    Hgb A1c Recent Labs    06/22/22 1759  HGBA1C 5.7*  Thyroid function studies Recent Labs    06/21/22 1400  TSH 3.088   I discussed in detail with patient's son via phone, updated care and answered all questions.   Time coordinating discharge: 25 minutes  SIGNED:  Vernell Leep, MD,  FACP, [facility], [facility], [facility], [facility]   [facility] [place]     To contact the attending provider between 7A-7P or the covering provider during after hours 7P-7A, please log into the web site [email or web address] and access using universal [place] password for that web site. If you do not have the password, please call the hospital operator.

## 2022-06-23 NOTE — Discharge Instructions (Signed)

## 2022-06-23 NOTE — Evaluation (Signed)
Occupational Therapy Evaluation Patient Details Name: Adriana Jordan MRN: [id number] DOB: Oct 01, 1934 Today's Date: 06/23/2022   History of Present Illness 87 yo female presents to [facility] on 11/13 with headache and weakness. MRI negative for acute findings, EEG negative. PMH includes pituitary adenoma s/p surgical resection and radiation, recent concern for adrenal insufficiency, hypertension, hyperlipidemia, coronary artery disease with remote MI, right arm skin cancer, cervical spine degenerative disc disease s/p ACDF.   Clinical Impression   PTA pt lives at home independently (including driving) with her son. Currently requires Min A with mobility and ADL tasks due to deficits listed below. Spoke to niece Charleston Ropes) over the phone and she states she will be able to provide direct assistance with mobility and ADL tasks @ RW level. Recommend follow up with [facility]. Acute OT to follow.      Recommendations for follow up therapy are one component of a multi-disciplinary discharge planning process, led by the attending physician.  Recommendations may be updated based on patient status, additional functional criteria and insurance authorization.   Follow Up Recommendations  Home health OT     Assistance Recommended at Discharge Frequent or constant Supervision/Assistance  Patient can return home with the following A little help with walking and/or transfers;A little help with bathing/dressing/bathroom;Assistance with cooking/housework;Direct supervision/assist for medications management;Direct supervision/assist for financial management;Assist for transportation;Help with stairs or ramp for entrance    Functional Status Assessment  Patient has had a recent decline in their functional status and demonstrates the ability to make significant improvements in function in a reasonable and predictable amount of time.  Equipment Recommendations  None recommended by OT    Recommendations for Other Services        Precautions / Restrictions Precautions Precautions: Fall Restrictions Weight Bearing Restrictions: No      Mobility Bed Mobility               General bed mobility comments: OOB in chair    Transfers Overall transfer level: Needs assistance Equipment used: Rolling walker (2 wheels) Transfers: Sit to/from Stand Sit to Stand: Min assist           General transfer comment: multiple trys to stand; once standing feel backwards dueto posterior bias; imporved with repetition adn VC to shift weight anteriorly      Balance Overall balance assessment: Needs assistance Sitting-balance support: No upper extremity supported, Feet supported Sitting balance-Leahy Scale: Fair Sitting balance - Comments: posterior bias with LE movement Postural control: Posterior lean Standing balance support: No upper extremity supported, Bilateral upper extremity supported, Reliant on assistive device for balance, During functional activity Standing balance-Leahy Scale: Poor Standing balance comment: reliant on external assist                           ADL either performed or assessed with clinical judgement   ADL                                               Vision Baseline Vision/History: 1 Wears glasses (no apparent deficits)       Perception     Praxis      Pertinent Vitals/Pain Pain Assessment Pain Assessment: Faces Pain Location: generalized Pain Descriptors / Indicators: Discomfort Pain Intervention(s): Limited activity within patient's tolerance     Hand Dominance Right   Extremity/Trunk Assessment  Upper Extremity Assessment Upper Extremity Assessment: Overall WFL for tasks assessed   Lower Extremity Assessment Lower Extremity Assessment: Defer to PT evaluation   Cervical / Trunk Assessment Cervical / Trunk Assessment: Normal   Communication Communication Communication: No difficulties   Cognition Arousal/Alertness:  Awake/alert Behavior During Therapy: Flat affect Overall Cognitive Status: Impaired/Different from baseline Area of Impairment: Memory, Attention, Awareness, Safety/judgement, Problem solving                   Current Attention Level: Selective Memory: Decreased short-term memory   Safety/Judgement: Decreased awareness of safety, Decreased awareness of deficits Awareness: Emergent Problem Solving: Slow processing General Comments: Scored an 8 on the Short Blessed Test, indicating questionable cognitive impairment in need of further assessment; pt agrees she feels "fuzzy headed"     General Comments       Exercises     Shoulder Instructions      Home Living Family/patient expects to be discharged to:: Private residence Living Arrangements: Children Available Help at Discharge: Family;Available 24 hours/day Type of Home: House Home Access: Stairs to enter [employer] of Steps: 3 Entrance Stairs-Rails: Can reach both Home Layout: One level     Bathroom Shower/Tub: Teacher, early years/pre: Standard Bathroom Accessibility: Yes How Accessible: Accessible via walker Home Equipment: [place] (2 wheels);Cane - single point   Additional Comments: info taken from chart as patient had increased confusion and hallicinations during session      Prior Functioning/Environment Prior Level of Function : Independent/Modified Independent;Driving             Mobility Comments: pt reports she is completely independent, states no history of falls          OT Problem List:        OT Treatment/Interventions:      OT Goals(Current goals can be found in the care plan section) Acute Rehab OT Goals Patient Stated Goal: to go home OT Goal Formulation: With patient Time For Goal Achievement: 07/07/22 Potential to Achieve Goals: Good  OT Frequency:      Co-evaluation              AM-PAC OT "6 Clicks" Daily Activity     Outcome Measure                  End of Session Equipment Utilized During Treatment: Gait belt;Rolling walker (2 wheels) Nurse Communication: Mobility status  Activity Tolerance: Patient tolerated treatment well Patient left: in chair;with call bell/phone within reach;with chair alarm set  OT Visit Diagnosis: Unsteadiness on feet (R26.81);History of falling (Z91.81);Muscle weakness (generalized) (M62.81);Other symptoms and signs involving cognitive function;Pain                Time: 0630-1601 OT Time Calculation (min): 27 min Charges:  OT General Charges $OT Visit: 1 Visit OT Evaluation $OT Eval Moderate Complexity: 1 Mod OT Treatments $Self Care/Home Management : 8-22 mins  Maurie Boettcher, OT/L   Acute OT Clinical Specialist Acute Rehabilitation Services Pager [phone] Office [phone]   [facility] 06/23/2022, 10:01 AM

## 2022-06-23 NOTE — Evaluation (Addendum)
Physical Therapy Evaluation Patient Details Name: Adriana Jordan MRN: [id number] DOB: 1935-04-11 Today's Date: 06/23/2022  History of Present Illness  87 yo female presents to [facility] on 11/13 with headache and weakness. MRI negative for acute findings, EEG negative. PMH includes pituitary adenoma s/p surgical resection and radiation, recent concern for adrenal insufficiency, hypertension, hyperlipidemia, coronary artery disease with remote MI, right arm skin cancer, cervical spine degenerative disc disease s/p ACDF.  Clinical Impression   Pt presents with generalized weakness, impaired safety awareness and awareness of deficits, impaired activity tolerance, poor balance with history of falls (she told PT she hasn't fallen but told OT she has). Pt to benefit from acute PT to address deficits. Pt ambulated hallway distance with use of RW, overall requiring min PT assist for correcting posterior bias and steadying. At baseline pt reports independence with mobility with no need for AD. PT discussed d/c options with pt, including ST-SNF for rehabilitation. Pt refuses SNF, states she will go home. PT recommending HHPT and increased support from niece for all mobility, per pt her son who lives with her had knee surgery x1 week ago and is on a RW. PT to progress mobility as tolerated, and will continue to follow acutely.         Recommendations for follow up therapy are one component of a multi-disciplinary discharge planning process, led by the attending physician.  Recommendations may be updated based on patient status, additional functional criteria and insurance authorization.  Follow Up Recommendations Home health PT      Assistance Recommended at Discharge Frequent or constant Supervision/Assistance  Patient can return home with the following  A little help with walking and/or transfers;A little help with bathing/dressing/bathroom;Direct supervision/assist for medications management;Direct  supervision/assist for financial management;Assist for transportation;Help with stairs or ramp for entrance    Equipment Recommendations None recommended by PT  Recommendations for Other Services       Functional Status Assessment Patient has had a recent decline in their functional status and demonstrates the ability to make significant improvements in function in a reasonable and predictable amount of time.     Precautions / Restrictions Precautions Precautions: Fall Restrictions Weight Bearing Restrictions: No      Mobility  Bed Mobility Overal bed mobility: Needs Assistance Bed Mobility: Supine to Sit     Supine to sit: Min assist     General bed mobility comments: assist for progression of hips to EOB, very increased time and use of bedrails    Transfers Overall transfer level: Needs assistance Equipment used: Rolling walker (2 wheels) Transfers: Sit to/from Stand Sit to Stand: Min assist           General transfer comment: assist for power up and correcting heavy posterior bias    Ambulation/Gait Ambulation/Gait assistance: Min assist Gait Distance (Feet): 150 Feet Assistive device: Rolling walker (2 wheels) Gait Pattern/deviations: Step-through pattern, Decreased stride length, Trunk flexed, Leaning posteriorly Gait velocity: decr     General Gait Details: assist to steady and correct intermittent posterior bias, pt with poor body awareness  Stairs            Wheelchair Mobility    Modified Rankin (Stroke Patients Only)       Balance Overall balance assessment: Needs assistance Sitting-balance support: No upper extremity supported, Feet supported Sitting balance-Leahy Scale: Fair Sitting balance - Comments: posterior bias with LE movement Postural control: Posterior lean Standing balance support: No upper extremity supported, Bilateral upper extremity supported, Reliant on assistive device  for balance, During functional activity Standing  balance-Leahy Scale: Poor Standing balance comment: reliant on external assist                             Pertinent Vitals/Pain Pain Assessment Pain Assessment: No/denies pain    Home Living Family/patient expects to be discharged to:: Private residence Living Arrangements: Children Available Help at Discharge: Family;Available 24 hours/day Type of Home: House Home Access: Stairs to enter   [employer] of Steps: 3   Home Layout: One level Home Equipment: Conservation officer, nature (2 wheels);Cane - single point      Prior Function Prior Level of Function : Independent/Modified Independent;Driving             Mobility Comments: pt reports she is completely independent, states no history of falls       Hand Dominance   Dominant Hand: Right    Extremity/Trunk Assessment   Upper Extremity Assessment Upper Extremity Assessment: Defer to OT evaluation    Lower Extremity Assessment Lower Extremity Assessment: Generalized weakness - 4/5 throughout, bilat     Cervical / Trunk Assessment Cervical / Trunk Assessment: Normal  Communication   Communication: No difficulties  Cognition Arousal/Alertness: Awake/alert Behavior During Therapy: Flat affect Overall Cognitive Status: No family/caregiver present to determine baseline cognitive functioning                                 General Comments: Pt A&Ox4 with increased time, very flat affect and delayed response time. Pt lacks insight into current deficits, little to no awareness of heavy posterior bias and fall risk at this time. Pt states "I will do fine when I get home" even though PT physically assisting pt on eval which pt attributes to "just woke up"        General Comments      Exercises     Assessment/Plan    PT Assessment Patient needs continued PT services  PT Problem List Decreased strength;Decreased mobility;Decreased activity tolerance;Decreased balance;Decreased knowledge  of use of DME;Decreased safety awareness;Decreased cognition       PT Treatment Interventions DME instruction;Therapeutic activities;Therapeutic exercise;Gait training;Patient/family education;Balance training;Stair training;Functional mobility training;Neuromuscular re-education    PT Goals (Current goals can be found in the Care Plan section)  Acute Rehab PT Goals Patient Stated Goal: home PT Goal Formulation: With patient Time For Goal Achievement: 07/07/22 Potential to Achieve Goals: Good    Frequency Min 3X/week     Co-evaluation               AM-PAC PT "6 Clicks" Mobility  Outcome Measure Help needed turning from your back to your side while in a flat bed without using bedrails?: A Little Help needed moving from lying on your back to sitting on the side of a flat bed without using bedrails?: A Little Help needed moving to and from a bed to a chair (including a wheelchair)?: A Little Help needed standing up from a chair using your arms (e.g., wheelchair or bedside chair)?: A Little Help needed to walk in hospital room?: A Little Help needed climbing 3-5 steps with a railing? : A Lot (simulated at hallway railing, x3 marches with SL support) 6 Click Score: 17    End of Session   Activity Tolerance: Patient tolerated treatment well;Patient limited by fatigue Patient left: in bed;with call bell/phone within reach;with nursing/sitter in room (NTs in  room, pt sitting EOB) Nurse Communication: Mobility status PT Visit Diagnosis: Other abnormalities of gait and mobility (R26.89);Muscle weakness (generalized) (M62.81)    Time: 6282-4175 PT Time Calculation (min) (ACUTE ONLY): 19 min   Charges:   PT Evaluation $PT Eval Low Complexity: 1 Low         Darci Lykins S, PT DPT Acute Rehabilitation Services Pager [phone]  Office [phone]   [facility] Ruffin Pyo 06/23/2022, 9:27 AM

## 2022-12-29 ENCOUNTER — Ambulatory Visit
Admission: RE | Admit: 2022-12-29 | Discharge: 2022-12-29 | Disposition: A | Discharge status: Home / Self Care | Payer: Medicare PPO | Source: Ambulatory Visit | Attending: Cardiovascular Disease | Admitting: Cardiovascular Disease

## 2022-12-29 ENCOUNTER — Other Ambulatory Visit: Payer: Self-pay | Admitting: Cardiovascular Disease

## 2022-12-29 DIAGNOSIS — R059 Cough, unspecified: Secondary | ICD-10-CM

## 2022-12-29 DIAGNOSIS — R0602 Shortness of breath: Secondary | ICD-10-CM

## 2023-01-30 ENCOUNTER — Other Ambulatory Visit: Payer: Self-pay

## 2023-01-30 ENCOUNTER — Emergency Department (HOSPITAL_COMMUNITY)
Admission: EM | Admit: 2023-01-30 | Discharge: 2023-01-31 | Disposition: A | Discharge status: Home / Self Care | Payer: Medicare PPO | Attending: Emergency Medicine | Admitting: Emergency Medicine

## 2023-01-30 ENCOUNTER — Emergency Department (HOSPITAL_COMMUNITY): Payer: Medicare PPO

## 2023-01-30 DIAGNOSIS — R531 Weakness: Secondary | ICD-10-CM

## 2023-01-30 DIAGNOSIS — E876 Hypokalemia: Secondary | ICD-10-CM | POA: Diagnosis not present

## 2023-01-30 LAB — I-STAT VENOUS BLOOD GAS, ED
Acid-Base Excess: 0 mmol/L (ref 0.0–2.0)
Bicarbonate: 22 mmol/L (ref 20.0–28.0)
Calcium, Ion: 0.87 mmol/L — CL (ref 1.15–1.40)
HCT: 40 % (ref 36.0–46.0)
Hemoglobin: 13.6 g/dL (ref 12.0–15.0)
O2 Saturation: 98 %
Potassium: 2.7 mmol/L — CL (ref 3.5–5.1)
Sodium: 135 mmol/L (ref 135–145)
TCO2: 23 mmol/L (ref 22–32)
pCO2, Ven: 28.1 mmHg — ABNORMAL LOW (ref 44–60)
pH, Ven: 7.501 — ABNORMAL HIGH (ref 7.25–7.43)
pO2, Ven: 90 mmHg — ABNORMAL HIGH (ref 32–45)

## 2023-01-30 LAB — COMPREHENSIVE METABOLIC PANEL
ALT: 15 U/L (ref 0–44)
AST: 35 U/L (ref 15–41)
Albumin: 3.4 g/dL — ABNORMAL LOW (ref 3.5–5.0)
Alkaline Phosphatase: 56 U/L (ref 38–126)
Anion gap: 14 (ref 5–15)
BUN: 9 mg/dL (ref 8–23)
CO2: 19 mmol/L — ABNORMAL LOW (ref 22–32)
Calcium: 8.5 mg/dL — ABNORMAL LOW (ref 8.9–10.3)
Chloride: 101 mmol/L (ref 98–111)
Creatinine, Ser: 1.43 mg/dL — ABNORMAL HIGH (ref 0.44–1.00)
GFR, Estimated: 36 mL/min — ABNORMAL LOW (ref >60)
Glucose, Bld: 99 mg/dL (ref 70–99)
Potassium: 2.6 mmol/L — CL (ref 3.5–5.1)
Sodium: 134 mmol/L — ABNORMAL LOW (ref 135–145)
Total Bilirubin: 0.8 mg/dL (ref 0.3–1.2)
Total Protein: 6.4 g/dL — ABNORMAL LOW (ref 6.5–8.1)

## 2023-01-30 LAB — I-STAT CHEM 8, ED
BUN: 8 mg/dL (ref 8–23)
Calcium, Ion: 1.06 mmol/L — ABNORMAL LOW (ref 1.15–1.40)
Chloride: 103 mmol/L (ref 98–111)
Creatinine, Ser: 1.2 mg/dL — ABNORMAL HIGH (ref 0.44–1.00)
Glucose, Bld: 92 mg/dL (ref 70–99)
HCT: 39 % (ref 36.0–46.0)
Hemoglobin: 13.3 g/dL (ref 12.0–15.0)
Potassium: 2.5 mmol/L — CL (ref 3.5–5.1)
Sodium: 139 mmol/L (ref 135–145)
TCO2: 22 mmol/L (ref 22–32)

## 2023-01-30 LAB — URINALYSIS, W/ REFLEX TO CULTURE (INFECTION SUSPECTED)
Bilirubin Urine: NEGATIVE
Glucose, UA: NEGATIVE mg/dL
Hgb urine dipstick: NEGATIVE
Ketones, ur: 20 mg/dL — AB
Nitrite: NEGATIVE
Protein, ur: NEGATIVE mg/dL
Specific Gravity, Urine: 1.006 (ref 1.005–1.030)
pH: 6 (ref 5.0–8.0)

## 2023-01-30 LAB — MAGNESIUM: Magnesium: 1.5 mg/dL — ABNORMAL LOW (ref 1.7–2.4)

## 2023-01-30 LAB — CBC WITH DIFFERENTIAL/PLATELET
Abs Immature Granulocytes: 0 10*3/uL (ref 0.00–0.07)
Basophils Absolute: 0 10*3/uL (ref 0.0–0.1)
Basophils Relative: 1 %
Eosinophils Absolute: 0.1 10*3/uL (ref 0.0–0.5)
Eosinophils Relative: 4 %
HCT: 41.2 % (ref 36.0–46.0)
Hemoglobin: 14 g/dL (ref 12.0–15.0)
Lymphocytes Relative: 45 %
Lymphs Abs: 1.2 10*3/uL (ref 0.7–4.0)
MCH: 32.3 pg (ref 26.0–34.0)
MCHC: 34 g/dL (ref 30.0–36.0)
MCV: 95.2 fL (ref 80.0–100.0)
Monocytes Absolute: 0.3 10*3/uL (ref 0.1–1.0)
Monocytes Relative: 11 %
Neutro Abs: 1.1 10*3/uL — ABNORMAL LOW (ref 1.7–7.7)
Neutrophils Relative %: 39 %
Platelets: 287 10*3/uL (ref 150–400)
RBC: 4.33 MIL/uL (ref 3.87–5.11)
RDW: 13 % (ref 11.5–15.5)
WBC: 2.7 10*3/uL — ABNORMAL LOW (ref 4.0–10.5)
nRBC: 0 % (ref 0.0–0.2)
nRBC: 0 /100 WBC

## 2023-01-30 LAB — LACTIC ACID, PLASMA: Lactic Acid, Venous: 1.8 mmol/L (ref 0.5–1.9)

## 2023-01-30 LAB — TYPE AND SCREEN
ABO/RH(D): A POS
Antibody Screen: NEGATIVE

## 2023-01-30 LAB — ABO/RH: ABO/RH(D): A POS

## 2023-01-30 LAB — PHOSPHORUS: Phosphorus: 3 mg/dL (ref 2.5–4.6)

## 2023-01-30 LAB — CK: Total CK: 284 U/L — ABNORMAL HIGH (ref 38–234)

## 2023-01-30 MED ORDER — HYDROCORTISONE SOD SUC (PF) 100 MG IJ SOLR
100.0000 mg | Freq: Once | INTRAMUSCULAR | Status: AC
  Administered 2023-01-30: 100 mg via INTRAVENOUS
  Filled 2023-01-30: qty 2

## 2023-01-30 MED ORDER — ENSURE ENLIVE PO LIQD
237.0000 mL | Freq: Two times a day (BID) | ORAL | Status: DC
  Administered 2023-01-31: 237 mL via ORAL
  Filled 2023-01-30 (×2): qty 237

## 2023-01-30 MED ORDER — MAGNESIUM SULFATE 2 GM/50ML IV SOLN
2.0000 g | Freq: Once | INTRAVENOUS | Status: AC
  Administered 2023-01-30: 2 g via INTRAVENOUS
  Filled 2023-01-30: qty 50

## 2023-01-30 MED ORDER — SODIUM CHLORIDE 0.9 % IV BOLUS
1000.0000 mL | Freq: Once | INTRAVENOUS | Status: AC
  Administered 2023-01-30: 1000 mL via INTRAVENOUS

## 2023-01-30 MED ORDER — POTASSIUM CHLORIDE 10 MEQ/100ML IV SOLN
10.0000 meq | INTRAVENOUS | Status: AC
  Administered 2023-01-30 (×2): 10 meq via INTRAVENOUS
  Filled 2023-01-30 (×2): qty 100

## 2023-01-30 MED ORDER — ADULT MULTIVITAMIN W/MINERALS CH
1.0000 | ORAL_TABLET | Freq: Every day | ORAL | Status: DC
  Administered 2023-01-30 – 2023-01-31 (×2): 1 via ORAL
  Filled 2023-01-30 (×2): qty 1

## 2023-01-30 MED ORDER — POTASSIUM CHLORIDE CRYS ER 20 MEQ PO TBCR
40.0000 meq | EXTENDED_RELEASE_TABLET | Freq: Once | ORAL | Status: AC
  Administered 2023-01-30: 40 meq via ORAL
  Filled 2023-01-30: qty 2

## 2023-01-30 NOTE — ED Notes (Signed)
Patient placed on bedpan, urine sample provided.

## 2023-01-30 NOTE — ED Notes (Signed)
Patient placed on bedpan and was able to void with no discomfort.

## 2023-01-30 NOTE — ED Provider Notes (Signed)
[place] EMERGENCY DEPARTMENT AT [facility] Provider Note   CSN: [id number] Arrival date & time: 01/30/23  1043     History  No chief complaint on file.   Adriana Jordan is a 88 y.o. female.  88 year old female with prior medical history significant for pituitary adenoma, adrenal insufficiency presents with apparent decreased appetite, weakness, and hypotension.  EMS reports initial call was for weakness.  On EMS's initial evaluation the patient's was significantly hypotensive.  Patient was apparently cold and clammy.  Patient was given a 1 L normal saline bolus during transport.  Patient was also noted to be hypoxic down into the mid 80s.  On arrival to the ED the patient's blood pressure is improved.  The patient reports that she feels improved.  She denies recent fever.  She reports decreased p.o. intake x 1 week.  She reports that she ran out of her Cortef approximately 2 to 3 days ago.  She has not taken any for the last 2 or 3 days.  The history is provided by the patient and medical records.       Home Medications Prior to Admission medications   Medication Sig Start Date End Date Taking? Authorizing Provider  feeding supplement (ENSURE ENLIVE / ENSURE PLUS) LIQD Take 237 mLs by mouth 2 (two) times daily between meals. 06/23/22   Hongalgi, Maximino Greenland, MD  hydrocortisone (CORTEF) 10 MG tablet Take 1 tablet (10 mg total) by mouth every evening. 06/23/22   Hongalgi, Maximino Greenland, MD  hydrocortisone (CORTEF) 20 MG tablet Take 1 tablet (20 mg total) by mouth every morning. 03/06/22   Regalado, Belkys A, MD  Multiple Vitamins-Minerals (PRESERVISION AREDS 2) CAPS Take 1 capsule by mouth every morning.    [provider]      Allergies    Codeine    Review of Systems   Review of Systems  All other systems reviewed and are negative.   Physical Exam Updated Vital Signs BP 129/74   Pulse 73   Temp 97.9 F (36.6 C) (Oral)   Resp 18   LMP  (LMP Unknown)    SpO2 94%  Physical Exam Vitals and nursing note reviewed.  Constitutional:      General: She is not in acute distress.    Appearance: Normal appearance. She is well-developed.  HENT:     Head: Normocephalic and atraumatic.  Eyes:     Conjunctiva/sclera: Conjunctivae normal.     Pupils: Pupils are equal, round, and reactive to light.  Cardiovascular:     Rate and Rhythm: Normal rate and regular rhythm.     Heart sounds: Normal heart sounds.  Pulmonary:     Effort: Pulmonary effort is normal. No respiratory distress.     Breath sounds: Normal breath sounds.  Abdominal:     General: There is no distension.     Palpations: Abdomen is soft.     Tenderness: There is no abdominal tenderness.  Musculoskeletal:        General: No deformity. Normal range of motion.     Cervical back: Normal range of motion and neck supple.  Skin:    General: Skin is warm and dry.  Neurological:     General: No focal deficit present.     Mental Status: She is alert and oriented to person, place, and time.     ED Results / Procedures / Treatments   Labs (all labs ordered are listed, but only abnormal results are displayed) Labs Reviewed  CBC WITH DIFFERENTIAL/PLATELET - Abnormal; Notable for the following components:      Result Value   WBC 2.7 (*)    Neutro Abs 1.1 (*)    All other components within normal limits  I-STAT VENOUS BLOOD GAS, ED - Abnormal; Notable for the following components:   pH, Ven 7.501 (*)    pCO2, Ven 28.1 (*)    pO2, Ven 90 (*)    Potassium 2.7 (*)    Calcium, Ion 0.87 (*)    All other components within normal limits  CULTURE, BLOOD (ROUTINE X 2)  CULTURE, BLOOD (ROUTINE X 2)  LACTIC ACID, PLASMA  LACTIC ACID, PLASMA  COMPREHENSIVE METABOLIC PANEL  URINALYSIS, W/ REFLEX TO CULTURE (INFECTION SUSPECTED)  CK  MAGNESIUM  PHOSPHORUS  I-STAT CHEM 8, ED  TYPE AND SCREEN  ABO/RH    EKG EKG Interpretation  Date/Time:  Sunday January 30 2023 11:06:11  EDT Ventricular Rate:  74 PR Interval:  186 QRS Duration: 109 QT Interval:  401 QTC Calculation: 445 R Axis:   -58 Text Interpretation: Sinus rhythm Left anterior fascicular block Abnormal R-wave progression, early transition Left ventricular hypertrophy Anterior Q waves, possibly due to LVH Nonspecific T abnormalities, lateral leads Confirmed by Kristine Royal [phone]) on 01/30/2023 11:15:20 AM  Radiology DG Chest Port 1 View  Result Date: 01/30/2023 CLINICAL DATA:  Shortness of breath EXAM: PORTABLE CHEST 1 VIEW COMPARISON:  12/29/2022 FINDINGS: Heart size is upper limits of normal. Aortic atherosclerosis. Prominent interstitial markings within the mid to lower lung fields bilaterally with subtle opacities in the bibasilar regions and periphery of the right mid lung. Probable small bilateral pleural effusions. No pneumothorax. IMPRESSION: Findings suggestive of mild pulmonary edema with small bilateral pleural effusions. Superimposed infection not excluded. Electronically Signed   By: Duanne Guess D.O.   On: 01/30/2023 10:58    Procedures Procedures    Medications Ordered in ED Medications  sodium chloride 0.9 % bolus 1,000 mL (1,000 mLs Intravenous New Bag/Given 01/30/23 1045)    ED Course/ Medical Decision Making/ A&P                             Medical Decision Making Amount and/or Complexity of Data Reviewed Labs: ordered. Radiology: ordered.  Risk Prescription drug management.    Medical Screen Complete  This patient presented to the ED with complaint of weakness, hypotension.  This complaint involves an extensive number of treatment options. The initial differential diagnosis includes, but is not limited to, adrenal insufficiency, metabolic abnormality, AKI, etc.  This presentation is: Acute, Chronic, Self-Limited, Previously Undiagnosed, Uncertain Prognosis, Complicated, Systemic Symptoms, and Threat to Life/Bodily Function  Patient is presenting with  significant weakness and associated hypotension.  Patient with apparent history of prior pituitary adenoma s/p radiation on chronic Cortef.  Patient admits that she ran out of her Cortef approximately 2 to 3 days ago.  Hydrocortisone administered here in the ED.  Patient with notable hypokalemia on the labs.  Potassium supplementation begun here in the ED.  Patient will require admission for further workup and treatment.  Dr. Algie Coffer made aware of case and he agrees with plan for admission. He plans on admitting the patient.   Additional history obtained: External records from outside sources obtained and reviewed including prior ED visits and prior Inpatient records.    Lab Tests:  I ordered and personally interpreted labs.    Imaging Studies ordered:  I ordered imaging studies including  chest x-ray  I agree with the radiologist interpretation.   Cardiac Monitoring:  The patient was maintained on a cardiac monitor.  I personally viewed and interpreted the cardiac monitor which showed an underlying rhythm of: NSR   Medicines ordered:  I ordered medication including hydrocortisone, IV fluids, potassium for hypokalemia, dehydration, adrenal insufficiency Reevaluation of the patient after these medicines showed that the patient: improved   Problem List / ED Course:  Weakness, hypotension, hypokalemia   Reevaluation:  After the interventions noted above, I reevaluated the patient and found that they have: improved   Disposition:  After consideration of the diagnostic results and the patients response to treatment, I feel that the patent would benefit from admission.          Final Clinical Impression(s) / ED Diagnoses Final diagnoses:  Weakness  Hypokalemia    Rx / DC Orders ED Discharge Orders     None         Wynetta Fines, MD 01/30/23 1410

## 2023-01-30 NOTE — ED Triage Notes (Signed)
Pt BIB [facility] from home after generalized weakness for the past two weeks with decreased appetite. Upon arrival pt had no palpable radial pulses, and a SBP of approximately 52/palp. Pt was cold and clammy. After 1 L NS patient had a radial pulse and last blood pressure was 100/palp. Patient was placed on a nonrebreather since her oxygen saturation was 86% on room air and patient verbalized she has sob. Pt then placed on 4 LPM, does not wear oxygen at baseline.

## 2023-01-30 NOTE — H&P (Signed)
Referring Physician: Kristine Royal, MD  Adriana Jordan is an 88 y.o. female.                       Chief Complaint: Hypotension and weakness  HPI: 88 years old black female with PMH of Adrenal insufficiency, pituitary adenoma- s/p surgery and radiation Tx, HTN, HLD and CAD had weakness and hypotension post running out of 20 mg. Cortef for over 1 week. She was hypotensive on arrival to ED. Post fluid resuscitation her BP has improved. She has significant hypokalemia.   Past Medical History:  Diagnosis Date   Arthritis    "all over" (11/14/2017)   Brain tumor (HCC)    "I still have it"; not cancer (11/14/2017)   Chronic lower back pain    Coronary artery disease    Dr. Algie Coffer   GERD (gastroesophageal reflux disease)    History of blood transfusion    "don't remember why" (11/14/2017)   History of radiation therapy 07/03/2018   brain, pituitary/ 12.5 Gy in 1 fraction   Hypercholesteremia    Hypertension    MI (myocardial infarction) (HCC) 2006   PONV (postoperative nausea and vomiting)    Skin cancer    "right forearm"      Past Surgical History:  Procedure Laterality Date   ANTERIOR CERVICAL DECOMP/DISCECTOMY FUSION     BACK SURGERY     CARPAL TUNNEL RELEASE Bilateral    CORONARY ANGIOPLASTY     DES mid LAD and mid RCA 09/21/04   CRANIOTOMY N/A 05/21/2015   Procedure: Transsphenoidal resection of pituitary tumor with Dr. Narda Bonds for approach;  Surgeon: Hilda Lias, MD;  Location: [facility] NEURO ORS;  Service: Neurosurgery;  Laterality: N/A;  Transsphenoidal resection of pituitary tumor with Dr. Narda Bonds for approach   SHOULDER OPEN ROTATOR CUFF REPAIR Bilateral    SKIN CANCER EXCISION Right    forearm   thumb surgery Right    placed srews to make straight   THYROID SURGERY     "goiter removed"    Family History  Problem Relation Age of Onset   Cancer Father 60       throat- smoker    Breast cancer Sister 37   Cancer Brother 70       lung- smoker   Social  History:  reports that she quit smoking about 53 years ago. Her smoking use included cigarettes. She has a 0.60 pack-year smoking history. She has never used smokeless tobacco. She reports that she does not currently use alcohol. She reports that she does not use drugs.  Allergies:  Allergies  Allergen Reactions   Codeine Nausea And Vomiting    Tolerates hydrocodone    (Not in a hospital admission)   Results for orders placed or performed during the hospital encounter of 01/30/23 (from the past 48 hour(s))  Type and screen [facility]     Status: None   Collection Time: 01/30/23 11:08 AM  Result Value Ref Range   ABO/RH(D) A POS    Antibody Screen NEG    Sample Expiration      02/02/2023,2359 Performed at [facility], [address], [place], [place] [address]   CBC with Differential     Status: Abnormal   Collection Time: 01/30/23 11:12 AM  Result Value Ref Range   WBC 2.7 (L) 4.0 - 10.5 K/uL   RBC 4.33 3.87 - 5.11 MIL/uL   Hemoglobin 14.0 12.0 - 15.0 g/dL  HCT 41.2 36.0 - 46.0 %   MCV 95.2 80.0 - 100.0 fL   MCH 32.3 26.0 - 34.0 pg   MCHC 34.0 30.0 - 36.0 g/dL   RDW 53.6 64.4 - 03.4 %   Platelets 287 150 - 400 K/uL   nRBC 0.0 0.0 - 0.2 %   Neutrophils Relative % 39 %   Neutro Abs 1.1 (L) 1.7 - 7.7 K/uL   Lymphocytes Relative 45 %   Lymphs Abs 1.2 0.7 - 4.0 K/uL   Monocytes Relative 11 %   Monocytes Absolute 0.3 0.1 - 1.0 K/uL   Eosinophils Relative 4 %   Eosinophils Absolute 0.1 0.0 - 0.5 K/uL   Basophils Relative 1 %   Basophils Absolute 0.0 0.0 - 0.1 K/uL   nRBC 0 0 /100 WBC   Abs Immature Granulocytes 0.00 0.00 - 0.07 K/uL    Comment: Performed at [facility], [address] [place], [place] [address]  Lactic acid, plasma     Status: None   Collection Time: 01/30/23 11:12 AM  Result Value Ref Range   Lactic Acid, Venous 1.8 0.5 - 1.9 mmol/L    Comment: Performed at [facility], [address], [place], [place] [address]   Comprehensive metabolic panel     Status: Abnormal   Collection Time: 01/30/23 11:12 AM  Result Value Ref Range   Sodium 134 (L) 135 - 145 mmol/L   Potassium 2.6 (LL) 3.5 - 5.1 mmol/L    Comment: CRITICAL RESULT CALLED TO, READ BACK BY AND VERIFIED WITH E ANELLO RN AT [phone] BY D LONG   Chloride 101 98 - 111 mmol/L   CO2 19 (L) 22 - 32 mmol/L   Glucose, Bld 99 70 - 99 mg/dL    Comment: Glucose reference range applies only to samples taken after fasting for at least 8 hours.   BUN 9 8 - 23 mg/dL   Creatinine, Ser 9.51 (H) 0.44 - 1.00 mg/dL   Calcium 8.5 (L) 8.9 - 10.3 mg/dL   Total Protein 6.4 (L) 6.5 - 8.1 g/dL   Albumin 3.4 (L) 3.5 - 5.0 g/dL   AST 35 15 - 41 U/L   ALT 15 0 - 44 U/L   Alkaline Phosphatase 56 38 - 126 U/L   Total Bilirubin 0.8 0.3 - 1.2 mg/dL   GFR, Estimated 36 (L) >60 mL/min    Comment: (NOTE) Calculated using the CKD-EPI Creatinine Equation (2021)    Anion gap 14 5 - 15    Comment: Performed at [facility], [address], [place], [place] [address]  ABO/Rh     Status: None   Collection Time: 01/30/23 11:12 AM  Result Value Ref Range   ABO/RH(D)      A POS Performed at [facility], [address], [place], [place] [address]   Magnesium     Status: Abnormal   Collection Time: 01/30/23 11:12 AM  Result Value Ref Range   Magnesium 1.5 (L) 1.7 - 2.4 mg/dL    Comment: Performed at [facility], [address] [place], [place] [address]  Phosphorus     Status: None   Collection Time: 01/30/23 11:12 AM  Result Value Ref Range   Phosphorus 3.0 2.5 - 4.6 mg/dL    Comment: Performed at [facility], [address] [place], [place] [address]  I-Stat venous blood gas, ED     Status: Abnormal   Collection Time:  01/30/23 11:46 AM  Result Value Ref Range   pH, Ven 7.501 (H) 7.25 - 7.43   pCO2, Ven 28.1 (L) 44 - 60 mmHg   pO2, Ven 90 (H) 32 - 45 mmHg   Bicarbonate 22.0 20.0 - 28.0 mmol/L   TCO2 23 22 - 32 mmol/L   O2 Saturation  98 %   Acid-Base Excess 0.0 0.0 - 2.0 mmol/L   Sodium 135 135 - 145 mmol/L   Potassium 2.7 (LL) 3.5 - 5.1 mmol/L   Calcium, Ion 0.87 (LL) 1.15 - 1.40 mmol/L   HCT 40.0 36.0 - 46.0 %   Hemoglobin 13.6 12.0 - 15.0 g/dL   Sample type VENOUS    Comment NOTIFIED PHYSICIAN   I-stat chem 8, ED     Status: Abnormal   Collection Time: 01/30/23 12:14 PM  Result Value Ref Range   Sodium 139 135 - 145 mmol/L   Potassium 2.5 (LL) 3.5 - 5.1 mmol/L   Chloride 103 98 - 111 mmol/L   BUN 8 8 - 23 mg/dL   Creatinine, Ser 4.40 (H) 0.44 - 1.00 mg/dL   Glucose, Bld 92 70 - 99 mg/dL    Comment: Glucose reference range applies only to samples taken after fasting for at least 8 hours.   Calcium, Ion 1.06 (L) 1.15 - 1.40 mmol/L   TCO2 22 22 - 32 mmol/L   Hemoglobin 13.3 12.0 - 15.0 g/dL   HCT 10.2 72.5 - 36.6 %   Comment NOTIFIED PHYSICIAN   Urinalysis, w/ Reflex to Culture (Infection Suspected) -Urine, Clean Catch     Status: Abnormal   Collection Time: 01/30/23 12:35 PM  Result Value Ref Range   Specimen Source URINE, CLEAN CATCH    Color, Urine YELLOW YELLOW   APPearance CLEAR CLEAR   Specific Gravity, Urine 1.006 1.005 - 1.030   pH 6.0 5.0 - 8.0   Glucose, UA NEGATIVE NEGATIVE mg/dL   Hgb urine dipstick NEGATIVE NEGATIVE   Bilirubin Urine NEGATIVE NEGATIVE   Ketones, ur 20 (A) NEGATIVE mg/dL   Protein, ur NEGATIVE NEGATIVE mg/dL   Nitrite NEGATIVE NEGATIVE   Leukocytes,Ua TRACE (A) NEGATIVE   RBC / HPF 0-5 0 - 5 RBC/hpf   WBC, UA 6-10 0 - 5 WBC/hpf    Comment:        Reflex urine culture not performed if WBC <=10, OR if Squamous epithelial cells >5. If Squamous epithelial cells >5 suggest recollection.    Bacteria, UA RARE (A) NONE SEEN   Squamous Epithelial / HPF 0-5 0 - 5 /HPF   Mucus PRESENT     Comment: Performed at [facility], [address], [place], [place] [address]   DG Chest Port 1 View  Result Date: 01/30/2023 CLINICAL DATA:  Shortness of breath EXAM: PORTABLE CHEST  1 VIEW COMPARISON:  12/29/2022 FINDINGS: Heart size is upper limits of normal. Aortic atherosclerosis. Prominent interstitial markings within the mid to lower lung fields bilaterally with subtle opacities in the bibasilar regions and periphery of the right mid lung. Probable small bilateral pleural effusions. No pneumothorax. IMPRESSION: Findings suggestive of mild pulmonary edema with small bilateral pleural effusions. Superimposed infection not excluded. Electronically Signed   By: Duanne Guess D.O.   On: 01/30/2023 10:58    Review Of Systems Constitutional: No fever, chills, weight loss or gain. Eyes: No vision change, wears glasses. No discharge or pain. Ears: No hearing loss, No tinnitus. Respiratory: No asthma, COPD, pneumonias. Positive shortness of breath. No hemoptysis.  Cardiovascular: Positive chest pain, nopalpitation, mild leg edema. Gastrointestinal: No nausea, vomiting, diarrhea, constipation. No GI bleed. No hepatitis. Genitourinary: No dysuria, hematuria, kidney stone. No incontinance. Neurological: No headache, stroke, seizures.  Psychiatry: No psych facility admission for anxiety, depression, suicide. No detox. Skin: No rash. Musculoskeletal: Positive joint pain, fibromyalgia, neck pain, back pain. Lymphadenopathy: No lymphadenopathy. Hematology: No anemia or easy bruising.   Blood pressure (!) 143/77, pulse 77, temperature 97.9 F (36.6 C), temperature source Oral, resp. rate 15, SpO2 95 %. There is no height or weight on file to calculate BMI. General appearance: alert, cooperative, appears stated age and no distress Head: Normocephalic, atraumatic. Eyes: Brown eyes, pink conjunctiva, corneas clear.  Neck: No adenopathy, no carotid bruit, no JVD, supple, symmetrical, trachea midline and thyroid not enlarged. Resp: Clear to auscultation bilaterally. Cardio: Regular rate and rhythm, S1, S2 normal, II/VI systolic murmur, no click, rub or gallop GI: Soft, non-tender;  bowel sounds normal; no organomegaly. Extremities: Trace edema, no cyanosis or clubbing. Skin: Warm and dry.  Neurologic: Alert and oriented X 2, normal strength. Normal coordination.  Assessment/Plan Acute adrenal insufficiency Severe hypokalemia Weakness due to above CAD S/P Coronary stents HTN HLD S/P pituitary adenoma  Dehydration  Plan: Resume Cortef. Admit. Home medications  Time spent: Review of old records, Lab, x-rays, EKG, other cardiac tests, examination, discussion with patient over 70 minutes.  Ricki Rodriguez, MD  01/30/2023, 2:28 PM

## 2023-01-30 NOTE — ED Notes (Signed)
I've place pt on Bedpan 2 time prior to this time.

## 2023-01-31 LAB — CBC
HCT: 38.3 % (ref 36.0–46.0)
Hemoglobin: 12.7 g/dL (ref 12.0–15.0)
MCH: 30.8 pg (ref 26.0–34.0)
MCHC: 33.2 g/dL (ref 30.0–36.0)
MCV: 93 fL (ref 80.0–100.0)
Platelets: 313 10*3/uL (ref 150–400)
RBC: 4.12 MIL/uL (ref 3.87–5.11)
RDW: 13 % (ref 11.5–15.5)
WBC: 4.5 10*3/uL (ref 4.0–10.5)
nRBC: 0 % (ref 0.0–0.2)

## 2023-01-31 LAB — BASIC METABOLIC PANEL
Anion gap: 14 (ref 5–15)
BUN: 10 mg/dL (ref 8–23)
CO2: 20 mmol/L — ABNORMAL LOW (ref 22–32)
Calcium: 8.8 mg/dL — ABNORMAL LOW (ref 8.9–10.3)
Chloride: 105 mmol/L (ref 98–111)
Creatinine, Ser: 1.16 mg/dL — ABNORMAL HIGH (ref 0.44–1.00)
GFR, Estimated: 46 mL/min — ABNORMAL LOW (ref >60)
Glucose, Bld: 95 mg/dL (ref 70–99)
Potassium: 3.1 mmol/L — ABNORMAL LOW (ref 3.5–5.1)
Sodium: 139 mmol/L (ref 135–145)

## 2023-01-31 LAB — MAGNESIUM: Magnesium: 1.9 mg/dL (ref 1.7–2.4)

## 2023-01-31 LAB — CULTURE, BLOOD (ROUTINE X 2): Culture: NO GROWTH

## 2023-01-31 MED ORDER — HYDROCORTISONE 20 MG PO TABS
20.0000 mg | ORAL_TABLET | Freq: Every morning | ORAL | Status: DC
  Administered 2023-01-31: 20 mg via ORAL
  Filled 2023-01-31: qty 1

## 2023-01-31 MED ORDER — ALBUTEROL SULFATE (2.5 MG/3ML) 0.083% IN NEBU: 2.5000 mg | INHALATION_SOLUTION | Freq: Four times a day (QID) | RESPIRATORY_TRACT | Status: DC | PRN

## 2023-01-31 MED ORDER — LOSARTAN POTASSIUM 50 MG PO TABS
25.0000 mg | ORAL_TABLET | Freq: Every day | ORAL | Status: DC
  Administered 2023-01-31: 25 mg via ORAL
  Filled 2023-01-31: qty 1

## 2023-01-31 MED ORDER — HYDROCORTISONE 10 MG PO TABS: 10.0000 mg | ORAL_TABLET | Freq: Every evening | ORAL | Status: DC

## 2023-01-31 MED ORDER — ROSUVASTATIN CALCIUM 20 MG PO TABS: 20.0000 mg | ORAL_TABLET | Freq: Every evening | ORAL | Status: DC

## 2023-01-31 MED ORDER — MECLIZINE HCL 25 MG PO TABS: 25.0000 mg | ORAL_TABLET | Freq: Two times a day (BID) | ORAL | Status: DC | PRN

## 2023-01-31 MED ORDER — POTASSIUM CHLORIDE CRYS ER 20 MEQ PO TBCR
40.0000 meq | EXTENDED_RELEASE_TABLET | Freq: Once | ORAL | Status: AC
  Administered 2023-01-31: 40 meq via ORAL
  Filled 2023-01-31: qty 2

## 2023-01-31 NOTE — Discharge Summary (Signed)
Physician Discharge Summary  Patient ID: Adriana Jordan MRN: [id number] DOB/AGE: [birth year] 88 y.o.  Admit date: 01/30/2023 Discharge date: 01/31/2023  Admission Diagnoses: Acute adrenal insufficiency Severe hypokalemia Weakness due to above CAD S/P Coronary stents HTN HLD S/P pituitary adenoma  Dehydration   Discharge Diagnoses:  Principle diagnosis: Acute on chronic adrenal insufficiency Active Problems: Severe hypokalemia, improving Weakness due to above CAD S/P Coronary stents HTN HLD S/P pituitary adenoma  Dehydration, improving  Discharged Condition: fair  Hospital Course: 88 years old black female with PMH of Adrenal insufficiency, pituitary adenoma- s/p surgery and radiation Tx, HTN, HLD and CAD had weakness and hypotension post running out of 20 mg. Cortef for over 1 week. She was hypotensive on arrival to ED. Post fluid resuscitation her BP has improved. She had significant hypokalemia which improved with supplementation. She insisted on going home for personal reasons and stay under son's supervision and will see me in 1 week.   Consults: cardiology  Significant Diagnostic Studies: labs: Near normal CBC, Normal Electrolytes except potassium of 2.6 improving to 3.1 post supplement. Normal magnesium post supplementation. Blood group: A positive  EKG: SR. LVH and LAHB.  CXR: Mild pulmonary edema.  Treatments: IV hydration and potassium and magnesium supplement.  Discharge Exam: Blood pressure 133/75, pulse 91, temperature (!) 97.5 F (36.4 C), resp. rate 16, SpO2 96 %. General appearance: alert, cooperative and appears stated age. Head: Normocephalic, atraumatic. Eyes: Brown eyes, pink conjunctiva, corneas clear.   Neck: No adenopathy, no carotid bruit, no JVD, supple, symmetrical, trachea midline and thyroid not enlarged. Resp: Clear to auscultation bilaterally. Cardio: Regular rate and rhythm, S1, S2 normal, III/VI systolic murmur, no click, rub or  gallop. GI: Soft, non-tender; bowel sounds normal; no organomegaly. Extremities: Trace edema, cyanosis or clubbing. Skin: Warm and dry.  Neurologic: Alert and oriented X 2, normal strength and tone. Normal coordination and slow gait.  Disposition:    Allergies as of 01/31/2023       Reactions   Codeine Nausea And Vomiting   Tolerates hydrocodone        Medication List     STOP taking these medications    amLODipine 2.5 MG tablet Commonly known as: NORVASC       TAKE these medications    albuterol 108 (90 Base) MCG/ACT inhaler Commonly known as: VENTOLIN HFA Inhale 2 puffs into the lungs every 6 (six) hours as needed for shortness of breath.   feeding supplement Liqd Take 237 mLs by mouth 2 (two) times daily between meals.   HYDROcodone-acetaminophen 5-325 MG tablet Commonly known as: NORCO/VICODIN Take 1 tablet by mouth every 6 (six) hours as needed for moderate pain.   hydrocortisone 20 MG tablet Commonly known as: CORTEF Take 1 tablet (20 mg total) by mouth every morning.   hydrocortisone 10 MG tablet Commonly known as: CORTEF Take 1 tablet (10 mg total) by mouth every evening.   LEG CRAMP RELIEF PO Take 1 tablet by mouth every evening.   losartan 25 MG tablet Commonly known as: COZAAR Take 25 mg by mouth daily.   meclizine 25 MG tablet Commonly known as: ANTIVERT Take 25 mg by mouth 2 (two) times daily as needed for dizziness.   PreserVision AREDS 2 Caps Take 1 capsule by mouth every morning.   rosuvastatin 20 MG tablet Commonly known as: CRESTOR Take 20 mg by mouth every evening.        Follow-up Information     Orpah Cobb, MD Follow up in  1 week(s).   Specialty: Cardiology Contact information: [address] [place] [address] [phone]                 Time spent: Review of old chart, current chart, lab, x-ray, cardiac tests and discussion with patient over 60 minutes.  Signed: Ricki Rodriguez 01/31/2023,  12:34 PM

## 2023-02-01 LAB — CULTURE, BLOOD (ROUTINE X 2)

## 2023-02-02 LAB — CULTURE, BLOOD (ROUTINE X 2): Culture: NO GROWTH

## 2023-02-04 LAB — CULTURE, BLOOD (ROUTINE X 2)

## 2023-02-13 IMAGING — MR MR HEAD WO/W CM
15 of 18 series · 35 of 48 positions shown · IV contrast (multihance)
Comparison: Brain MRI 03/21/2020 and earlier.

CLINICAL DATA: 86-year-old female with history of pituitary tumor
status post resection in 2105 and SRS in 7930.

EXAM:
MRI HEAD WITHOUT AND WITH CONTRAST
TECHNIQUE: Multiplanar, multiecho pulse sequences of the brain and surrounding
structures were obtained without and with intravenous contrast.
CONTRAST:  6mL MULTIHANCE GADOBENATE DIMEGLUMINE 529 MG/ML IV SOLN

[Series 2: T1 · sagittal · 5.0mm · 0.49mm/px · 3 of 24 slices shown]
[im 1/24]
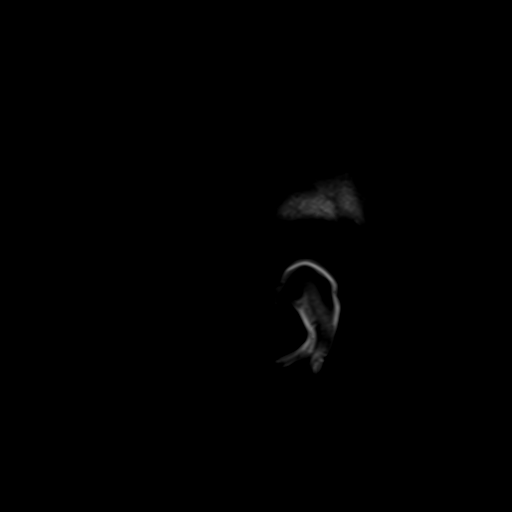
[im 12/24]
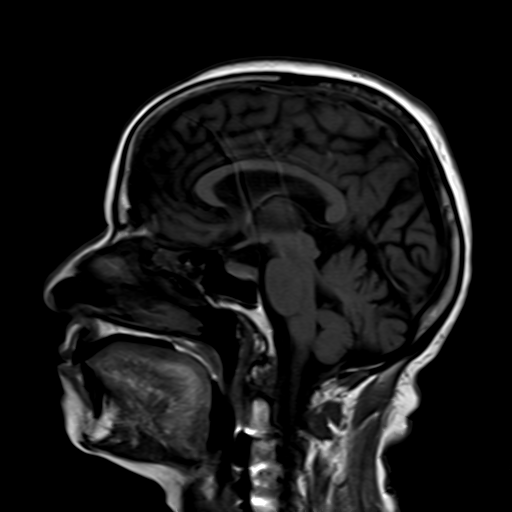
[im 24/24]
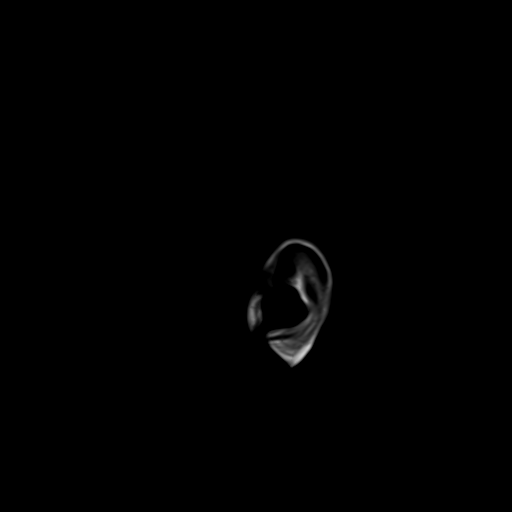

[Series 3: DWI · axial · 3.0mm · 1.88mm/px · z∈[-73,+83]mm · 8 of 104 slices shown]
[im 1/104]
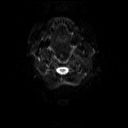
[im 12/104]
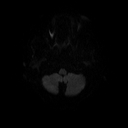
[im 35/104]
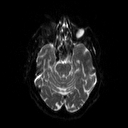
[im 46/104]
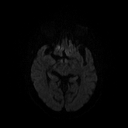
[im 58/104]
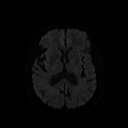
[im 69/104]
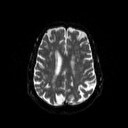
[im 92/104]
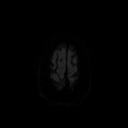
[im 104/104]
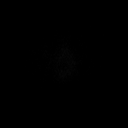

[Series 4: dwi_adc · axial · 3.0mm · 1.88mm/px · z∈[-73,+83]mm · 6 of 52 slices shown]
[im 1/52]
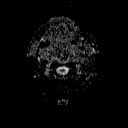
[im 11/52]
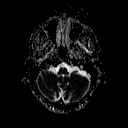
[im 21/52]
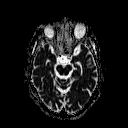
[im 31/52]
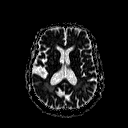
[im 41/52]
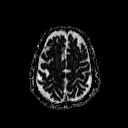
[im 52/52]
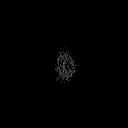

[Series 5: T2 · axial · 5.0mm · 0.45mm/px · z∈[-73,+82]mm · 2 of 25 slices shown]
[im 1/25]
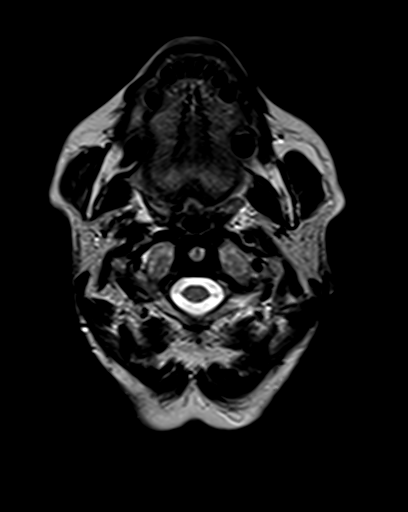
[im 25/25]
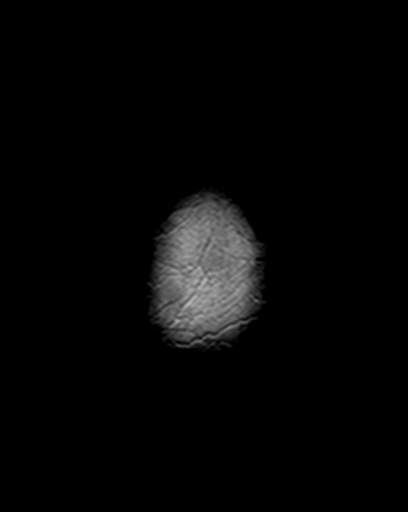

[Series 6: FLAIR · axial · 3.0mm · 0.45mm/px · z∈[-71,+82]mm · 3 of 34 slices shown]
[im 1/34]
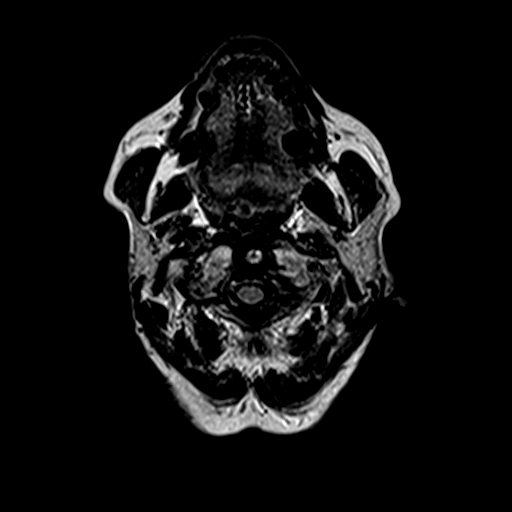
[im 17/34]
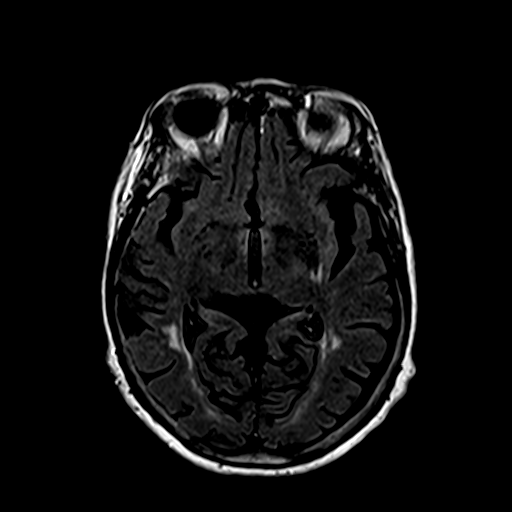
[im 34/34]
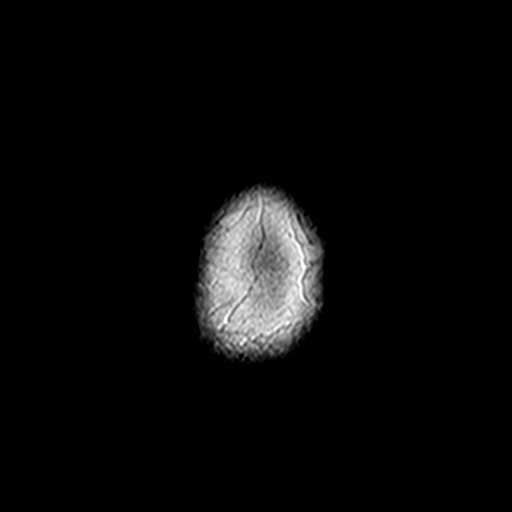

[Series 8: swi_images · axial · 4.0mm · 0.94mm/px · z∈[-72,+83]mm · 4 of 40 slices shown]
[im 1/40]
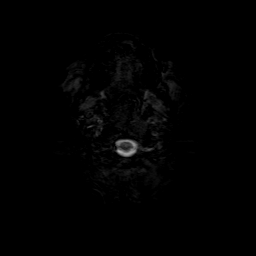
[im 14/40]
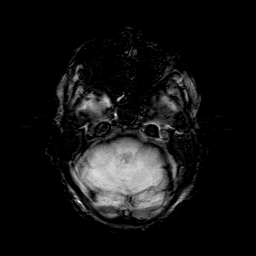
[im 27/40]
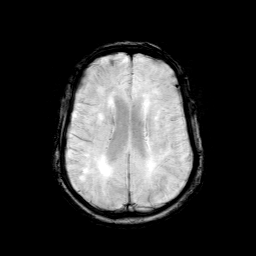
[im 40/40]
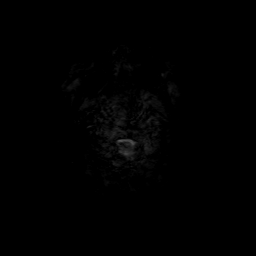

[Series 10: sag 3mm · sagittal · 3.0mm · 0.33mm/px · 1 of 15 slices shown]
[im 1/15]
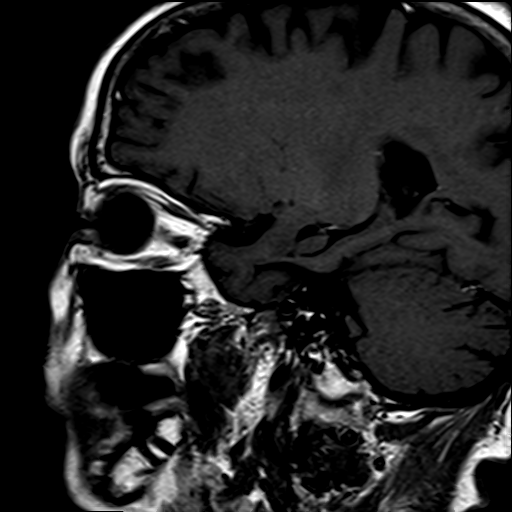

[Series 11: cor 3mm · coronal · 3.0mm · 0.33mm/px · 1 of 14 slices shown]
[im 1/14]
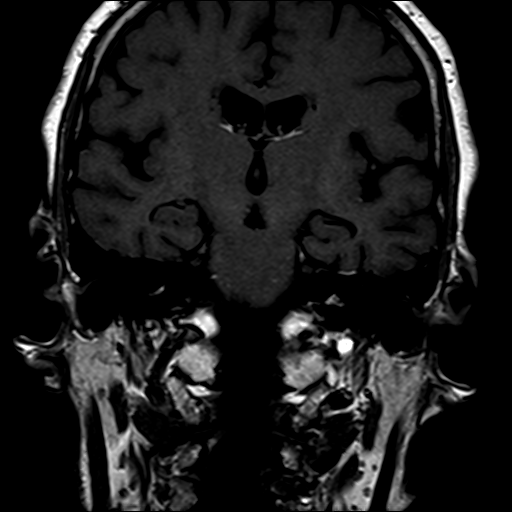

[Series 12: pre cor dynamic · coronal · non-contrast · 3.0mm · 0.35mm/px · 1 of 15 slices shown]
[im 1/15]
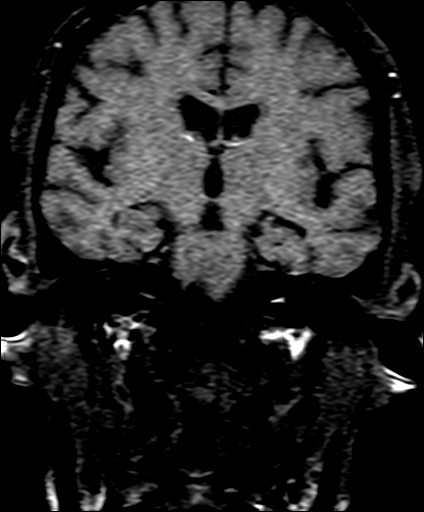

[Series 20: post fs cor · coronal · 3.0mm · 0.35mm/px · 1 of 15 slices shown (1 of 6)]
[im 1/15]
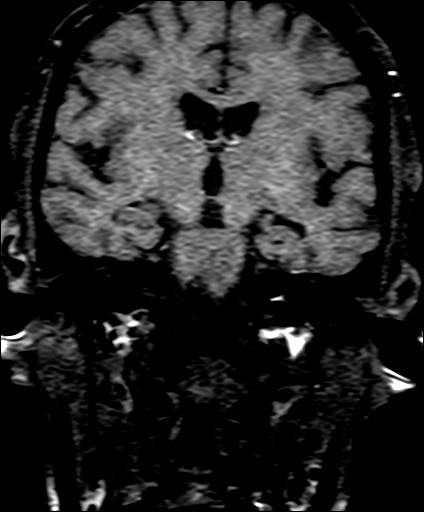

[Series 21: post fs cor · coronal · 3.0mm · 0.35mm/px · 1 of 15 slices shown (2 of 6)]
[im 1/15]
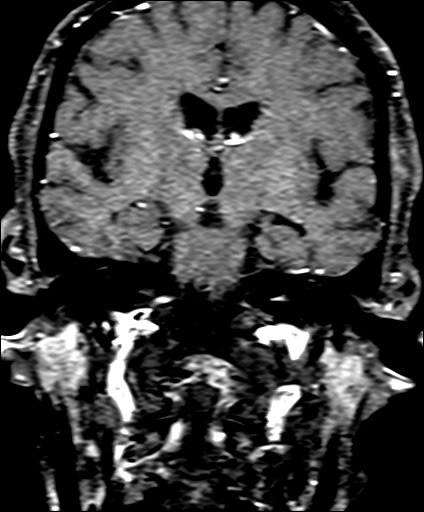

[Series 22: post fs cor · coronal · 3.0mm · 0.35mm/px · 1 of 15 slices shown (3 of 6)]
[im 1/15]
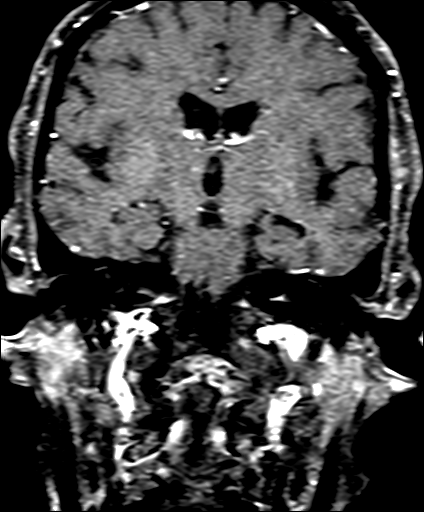

[Series 23: post fs cor · coronal · 3.0mm · 0.35mm/px · 1 of 15 slices shown (4 of 6)]
[im 1/15]
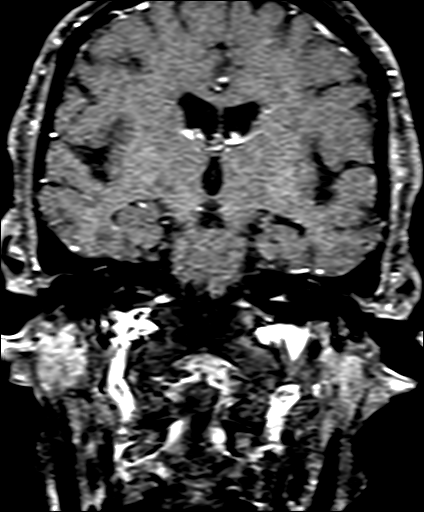

[Series 24: post fs cor · coronal · 3.0mm · 0.35mm/px · 1 of 15 slices shown (5 of 6)]
[im 1/15]
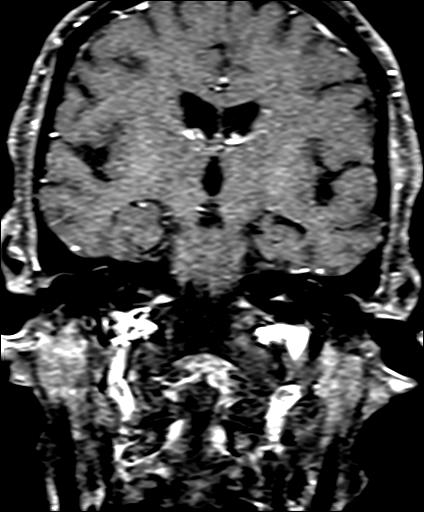

[Series 25: post fs cor · coronal · 3.0mm · 0.35mm/px · 1 of 15 slices shown (6 of 6)]
[im 1/15]
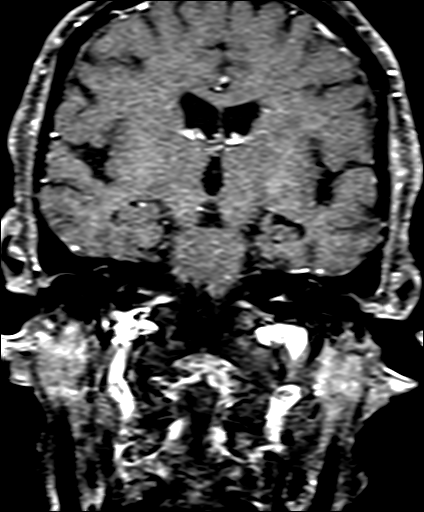

[35 of 48 positions shown; findings below may reference images not displayed]

FINDINGS: Brain: No restricted diffusion to suggest acute infarction. No
midline shift, mass effect, evidence of mass lesion,
ventriculomegaly, extra-axial collection or acute intracranial
hemorrhage. Cervicomedullary junction is within normal limits.

Widely scattered, patchy bilateral cerebral white matter T2 and
FLAIR hyperintensity is stable since last year. Chronic involvement
of the external capsule on the left. No visible cortical
encephalomalacia following the left middle frontal gyrus punctate
infarct last year. Tiny chronic cerebellar lacunar infarcts are
stable. Stable chronic microhemorrhage in the right deep cerebellar
nuclei. No new signal abnormality identified. No abnormal gray or
white matter enhancement. No dural thickening identified.

Vascular: Major intracranial vascular flow voids are stable.

Skull and upper cervical spine: Chronic cervical ACDF. Normal
visible bone marrow signal.

Sinuses/Orbits: Negative orbits. Chronic paranasal sinus mucosal
thickening has increased in the ethmoids since last year. Mastoids
remain clear.

Other: Dedicated pituitary imaging. Suprasellar cistern remains
patent. Deviated infundibulum to the right is stable. No thickening
of the infundibulum. Normal hypothalamus. The right cavernous sinus
remains normal.

Heterogeneously enhancing increased soft tissue extending from the
central sella into the left cavernous sinus is unchanged in size and
configuration since Tuesday September, 2019, approximately 16 x 16 x 8 mm
(AP by transverse by CC). No new abnormality in the region.
IMPRESSION: 1. Continued stable post treatment appearance of the left side
pituitary adenoma. Unchanged chronic left cavernous sinus
involvement.
2. Chronic cerebral small vessel ischemia. No acute intracranial
abnormality.

## 2023-02-14 ENCOUNTER — Encounter (HOSPITAL_COMMUNITY): Payer: Self-pay | Admitting: Emergency Medicine

## 2023-02-14 ENCOUNTER — Emergency Department (HOSPITAL_COMMUNITY): Payer: Medicare PPO

## 2023-02-14 ENCOUNTER — Other Ambulatory Visit: Payer: Self-pay

## 2023-02-14 ENCOUNTER — Inpatient Hospital Stay (HOSPITAL_COMMUNITY)
Admission: EM | Admit: 2023-02-14 | Discharge: 2023-02-20 | DRG: 194 | Disposition: A | Discharge status: Home Health | Payer: Medicare PPO | Attending: Cardiovascular Disease | Admitting: Cardiovascular Disease

## 2023-02-14 DIAGNOSIS — E860 Dehydration: Secondary | ICD-10-CM | POA: Diagnosis present

## 2023-02-14 DIAGNOSIS — Z981 Arthrodesis status: Secondary | ICD-10-CM

## 2023-02-14 DIAGNOSIS — E7800 Pure hypercholesterolemia, unspecified: Secondary | ICD-10-CM | POA: Diagnosis present

## 2023-02-14 DIAGNOSIS — Z79899 Other long term (current) drug therapy: Secondary | ICD-10-CM

## 2023-02-14 DIAGNOSIS — E8721 Acute metabolic acidosis: Secondary | ICD-10-CM | POA: Diagnosis present

## 2023-02-14 DIAGNOSIS — J189 Pneumonia, unspecified organism: Secondary | ICD-10-CM

## 2023-02-14 DIAGNOSIS — R627 Adult failure to thrive: Secondary | ICD-10-CM | POA: Diagnosis present

## 2023-02-14 DIAGNOSIS — Z885 Allergy status to narcotic agent status: Secondary | ICD-10-CM | POA: Diagnosis not present

## 2023-02-14 DIAGNOSIS — Z87891 Personal history of nicotine dependence: Secondary | ICD-10-CM

## 2023-02-14 DIAGNOSIS — E2740 Unspecified adrenocortical insufficiency: Secondary | ICD-10-CM | POA: Diagnosis present

## 2023-02-14 DIAGNOSIS — Z1152 Encounter for screening for COVID-19: Secondary | ICD-10-CM

## 2023-02-14 DIAGNOSIS — I2510 Atherosclerotic heart disease of native coronary artery without angina pectoris: Secondary | ICD-10-CM | POA: Diagnosis present

## 2023-02-14 DIAGNOSIS — E871 Hypo-osmolality and hyponatremia: Secondary | ICD-10-CM

## 2023-02-14 DIAGNOSIS — K219 Gastro-esophageal reflux disease without esophagitis: Secondary | ICD-10-CM | POA: Diagnosis present

## 2023-02-14 DIAGNOSIS — Z955 Presence of coronary angioplasty implant and graft: Secondary | ICD-10-CM | POA: Diagnosis not present

## 2023-02-14 DIAGNOSIS — I252 Old myocardial infarction: Secondary | ICD-10-CM | POA: Diagnosis not present

## 2023-02-14 DIAGNOSIS — E876 Hypokalemia: Secondary | ICD-10-CM

## 2023-02-14 DIAGNOSIS — Z85828 Personal history of other malignant neoplasm of skin: Secondary | ICD-10-CM | POA: Diagnosis not present

## 2023-02-14 DIAGNOSIS — I10 Essential (primary) hypertension: Secondary | ICD-10-CM | POA: Diagnosis present

## 2023-02-14 DIAGNOSIS — Z923 Personal history of irradiation: Secondary | ICD-10-CM

## 2023-02-14 DIAGNOSIS — J9601 Acute respiratory failure with hypoxia: Principal | ICD-10-CM

## 2023-02-14 HISTORY — DX: Pneumonia, unspecified organism: J18.9

## 2023-02-14 LAB — CBC
HCT: 45.8 % (ref 36.0–46.0)
HCT: 41.4 % (ref 36.0–46.0)
Hemoglobin: 15.2 g/dL — ABNORMAL HIGH (ref 12.0–15.0)
Hemoglobin: 14 g/dL (ref 12.0–15.0)
MCH: 30.9 pg (ref 26.0–34.0)
MCH: 30.6 pg (ref 26.0–34.0)
MCHC: 33.2 g/dL (ref 30.0–36.0)
MCHC: 33.8 g/dL (ref 30.0–36.0)
MCV: 93.1 fL (ref 80.0–100.0)
MCV: 90.6 fL (ref 80.0–100.0)
Platelets: 372 10*3/uL (ref 150–400)
Platelets: 350 10*3/uL (ref 150–400)
RBC: 4.92 MIL/uL (ref 3.87–5.11)
RBC: 4.57 MIL/uL (ref 3.87–5.11)
RDW: 13 % (ref 11.5–15.5)
RDW: 12.9 % (ref 11.5–15.5)
WBC: 3.7 10*3/uL — ABNORMAL LOW (ref 4.0–10.5)
WBC: 3.2 10*3/uL — ABNORMAL LOW (ref 4.0–10.5)
nRBC: 0 % (ref 0.0–0.2)
nRBC: 0 % (ref 0.0–0.2)

## 2023-02-14 LAB — URINALYSIS, ROUTINE W REFLEX MICROSCOPIC
Bacteria, UA: NONE SEEN
Bilirubin Urine: NEGATIVE
Glucose, UA: NEGATIVE mg/dL
Hgb urine dipstick: NEGATIVE
Ketones, ur: 80 mg/dL — AB
Leukocytes,Ua: NEGATIVE
Nitrite: NEGATIVE
Protein, ur: NEGATIVE mg/dL
Specific Gravity, Urine: 1.023 (ref 1.005–1.030)
pH: 5 (ref 5.0–8.0)

## 2023-02-14 LAB — TROPONIN I (HIGH SENSITIVITY)
Troponin I (High Sensitivity): 11 ng/L (ref <18)
Troponin I (High Sensitivity): 10 ng/L (ref <18)

## 2023-02-14 LAB — BASIC METABOLIC PANEL
Anion gap: 15 (ref 5–15)
BUN: 7 mg/dL — ABNORMAL LOW (ref 8–23)
CO2: 17 mmol/L — ABNORMAL LOW (ref 22–32)
Calcium: 9.5 mg/dL (ref 8.9–10.3)
Chloride: 95 mmol/L — ABNORMAL LOW (ref 98–111)
Creatinine, Ser: 1.14 mg/dL — ABNORMAL HIGH (ref 0.44–1.00)
GFR, Estimated: 47 mL/min — ABNORMAL LOW (ref >60)
Glucose, Bld: 141 mg/dL — ABNORMAL HIGH (ref 70–99)
Potassium: 2.9 mmol/L — ABNORMAL LOW (ref 3.5–5.1)
Sodium: 127 mmol/L — ABNORMAL LOW (ref 135–145)

## 2023-02-14 LAB — BRAIN NATRIURETIC PEPTIDE: B Natriuretic Peptide: 22.2 pg/mL (ref 0.0–100.0)

## 2023-02-14 LAB — LACTIC ACID, PLASMA: Lactic Acid, Venous: 2.9 mmol/L (ref 0.5–1.9)

## 2023-02-14 LAB — CBG MONITORING, ED: Glucose-Capillary: 157 mg/dL — ABNORMAL HIGH (ref 70–99)

## 2023-02-14 LAB — CREATININE, SERUM
Creatinine, Ser: 1.06 mg/dL — ABNORMAL HIGH (ref 0.44–1.00)
GFR, Estimated: 51 mL/min — ABNORMAL LOW (ref >60)

## 2023-02-14 LAB — RESP PANEL BY RT-PCR (RSV, FLU A&B, COVID)  RVPGX2
Influenza A by PCR: NEGATIVE
Influenza B by PCR: NEGATIVE
Resp Syncytial Virus by PCR: NEGATIVE
SARS Coronavirus 2 by RT PCR: NEGATIVE

## 2023-02-14 LAB — MAGNESIUM: Magnesium: 1.5 mg/dL — ABNORMAL LOW (ref 1.7–2.4)

## 2023-02-14 MED ORDER — ROSUVASTATIN CALCIUM 20 MG PO TABS
20.0000 mg | ORAL_TABLET | Freq: Every evening | ORAL | Status: DC
  Administered 2023-02-15 – 2023-02-19 (×5): 20 mg via ORAL
  Filled 2023-02-14 (×5): qty 1

## 2023-02-14 MED ORDER — IOHEXOL 350 MG/ML SOLN
65.0000 mL | Freq: Once | INTRAVENOUS | Status: AC | PRN
  Administered 2023-02-14: 65 mL via INTRAVENOUS

## 2023-02-14 MED ORDER — LACTATED RINGERS IV BOLUS
500.0000 mL | Freq: Once | INTRAVENOUS | Status: AC
  Administered 2023-02-14: 500 mL via INTRAVENOUS

## 2023-02-14 MED ORDER — HYDROCORTISONE SOD SUC (PF) 100 MG IJ SOLR
100.0000 mg | Freq: Once | INTRAMUSCULAR | Status: AC
  Administered 2023-02-14: 100 mg via INTRAVENOUS
  Filled 2023-02-14: qty 2

## 2023-02-14 MED ORDER — SODIUM CHLORIDE 0.9 % IV SOLN
1.0000 g | Freq: Once | INTRAVENOUS | Status: AC
  Administered 2023-02-14: 1 g via INTRAVENOUS
  Filled 2023-02-14: qty 10

## 2023-02-14 MED ORDER — HYDROCODONE-ACETAMINOPHEN 5-325 MG PO TABS
1.0000 | ORAL_TABLET | Freq: Three times a day (TID) | ORAL | Status: DC | PRN
  Administered 2023-02-15 – 2023-02-19 (×4): 1 via ORAL
  Filled 2023-02-14 (×5): qty 1

## 2023-02-14 MED ORDER — LOSARTAN POTASSIUM 50 MG PO TABS
25.0000 mg | ORAL_TABLET | Freq: Every day | ORAL | Status: DC
  Administered 2023-02-14 – 2023-02-19 (×5): 25 mg via ORAL
  Filled 2023-02-14 (×7): qty 1

## 2023-02-14 MED ORDER — MAGNESIUM SULFATE 2 GM/50ML IV SOLN
2.0000 g | Freq: Once | INTRAVENOUS | Status: AC
  Administered 2023-02-14: 2 g via INTRAVENOUS
  Filled 2023-02-14: qty 50

## 2023-02-14 MED ORDER — ONDANSETRON HCL 4 MG/2ML IJ SOLN
4.0000 mg | Freq: Once | INTRAMUSCULAR | Status: AC
  Administered 2023-02-14: 4 mg via INTRAVENOUS
  Filled 2023-02-14: qty 2

## 2023-02-14 MED ORDER — ALBUTEROL SULFATE (2.5 MG/3ML) 0.083% IN NEBU: 3.0000 mL | INHALATION_SOLUTION | Freq: Four times a day (QID) | RESPIRATORY_TRACT | Status: DC | PRN

## 2023-02-14 MED ORDER — SODIUM CHLORIDE 0.9 % IV SOLN: INTRAVENOUS | Status: DC

## 2023-02-14 MED ORDER — MECLIZINE HCL 25 MG PO TABS: 25.0000 mg | ORAL_TABLET | Freq: Two times a day (BID) | ORAL | Status: DC | PRN

## 2023-02-14 MED ORDER — HYDROCORTISONE 10 MG PO TABS
10.0000 mg | ORAL_TABLET | Freq: Every evening | ORAL | Status: DC
  Administered 2023-02-14 – 2023-02-19 (×6): 10 mg via ORAL
  Filled 2023-02-14 (×7): qty 1

## 2023-02-14 MED ORDER — SODIUM CHLORIDE 0.9 % IV SOLN
500.0000 mg | Freq: Once | INTRAVENOUS | Status: AC
  Administered 2023-02-14: 500 mg via INTRAVENOUS
  Filled 2023-02-14: qty 5

## 2023-02-14 MED ORDER — ENSURE ENLIVE PO LIQD
237.0000 mL | Freq: Two times a day (BID) | ORAL | Status: DC
  Administered 2023-02-15: 237 mL via ORAL

## 2023-02-14 MED ORDER — HYDROCORTISONE 20 MG PO TABS
20.0000 mg | ORAL_TABLET | ORAL | Status: DC
  Administered 2023-02-15 – 2023-02-20 (×6): 20 mg via ORAL
  Filled 2023-02-14 (×6): qty 1

## 2023-02-14 MED ORDER — PROSIGHT PO TABS
1.0000 | ORAL_TABLET | Freq: Every morning | ORAL | Status: DC
  Administered 2023-02-15 – 2023-02-20 (×6): 1 via ORAL
  Filled 2023-02-14 (×7): qty 1

## 2023-02-14 MED ORDER — HEPARIN SODIUM (PORCINE) 5000 UNIT/ML IJ SOLN
5000.0000 [IU] | Freq: Three times a day (TID) | INTRAMUSCULAR | Status: DC
  Administered 2023-02-14 – 2023-02-20 (×17): 5000 [IU] via SUBCUTANEOUS
  Filled 2023-02-14 (×18): qty 1

## 2023-02-14 MED ORDER — POTASSIUM CHLORIDE 10 MEQ/100ML IV SOLN
10.0000 meq | INTRAVENOUS | Status: AC
  Administered 2023-02-14 (×3): 10 meq via INTRAVENOUS
  Filled 2023-02-14 (×3): qty 100

## 2023-02-14 NOTE — ED Notes (Signed)
Pt remains at 100% on 5L [place].  O2 turned down to 4L [place].

## 2023-02-14 NOTE — ED Notes (Signed)
ED TO INPATIENT HANDOFF REPORT  ED Nurse Name and Phone #: [phone]  S Name/Age/Gender Adriana Jordan 88 y.o. female Room/Bed: 010C/010C  Code Status   Code Status: Full Code  Home/SNF/Other Home Patient oriented to: self, place, time, and situation Is this baseline? Yes   Triage Complete: Triage complete  Chief Complaint Community acquired pneumonia [J18.9]  Triage Note Pt BIB [facility] from home for complaints of dizziness and failure to thrive. Pt states she hasn't had any medication since her last visit to the ER 2 weeks ago because she doesn't have any. Pt lives with son and told EMS that pt refused to eat and staying in bed. Denies any pain. A&O x 4    Allergies Allergies  Allergen Reactions   Codeine Nausea And Vomiting    Tolerates hydrocodone    Level of Care/Admitting Diagnosis ED Disposition     ED Disposition  Admit   Condition  --   Comment  Hospital Area: [facility] [[id number]]  Level of Care: Telemetry Medical [[id number]]  May admit patient to [facility] or [facility] if equivalent level of care is available:: No  Covid Evaluation: Asymptomatic - no recent exposure (last 10 days) testing not required  Diagnosis: Community acquired pneumonia [782956]  Admitting Physician: Orpah Cobb [[id number]]  Attending Physician: Orpah Cobb [[id number]]  Certification:: I certify this patient will need inpatient services for at least 2 midnights  Estimated Length of Stay: 3          B Medical/Surgery History Past Medical History:  Diagnosis Date   Arthritis    "all over" (11/14/2017)   Brain tumor (HCC)    "I still have it"; not cancer (11/14/2017)   Chronic lower back pain    Coronary artery disease    Dr. Algie Coffer   GERD (gastroesophageal reflux disease)    History of blood transfusion    "don't remember why" (11/14/2017)   History of radiation therapy 07/03/2018   brain, pituitary/ 12.5 Gy in 1 fraction   Hypercholesteremia     Hypertension    MI (myocardial infarction) (HCC) 2006   PONV (postoperative nausea and vomiting)    Skin cancer    "right forearm"   Past Surgical History:  Procedure Laterality Date   ANTERIOR CERVICAL DECOMP/DISCECTOMY FUSION     BACK SURGERY     CARPAL TUNNEL RELEASE Bilateral    CORONARY ANGIOPLASTY     DES mid LAD and mid RCA 09/21/04   CRANIOTOMY N/A 05/21/2015   Procedure: Transsphenoidal resection of pituitary tumor with Dr. Narda Bonds for approach;  Surgeon: Hilda Lias, MD;  Location: [facility] NEURO ORS;  Service: Neurosurgery;  Laterality: N/A;  Transsphenoidal resection of pituitary tumor with Dr. Narda Bonds for approach   SHOULDER OPEN ROTATOR CUFF REPAIR Bilateral    SKIN CANCER EXCISION Right    forearm   thumb surgery Right    placed srews to make straight   THYROID SURGERY     "goiter removed"     A IV Location/Drains/Wounds Patient Lines/Drains/Airways Status     Active Line/Drains/Airways     Name Placement date Placement time Site Days   Peripheral IV 02/14/23 20 G Anterior;Right;Upper Forearm 02/14/23  1304  Forearm  less than 1            Intake/Output Last 24 hours No intake or output data in the 24 hours ending 02/14/23 1925  Labs/Imaging Results for orders placed or performed during the hospital encounter of 02/14/23 (  from the past 48 hour(s))  Basic metabolic panel     Status: Abnormal   Collection Time: 02/14/23  1:31 PM  Result Value Ref Range   Sodium 127 (L) 135 - 145 mmol/L   Potassium 2.9 (L) 3.5 - 5.1 mmol/L   Chloride 95 (L) 98 - 111 mmol/L   CO2 17 (L) 22 - 32 mmol/L   Glucose, Bld 141 (H) 70 - 99 mg/dL    Comment: Glucose reference range applies only to samples taken after fasting for at least 8 hours.   BUN 7 (L) 8 - 23 mg/dL   Creatinine, Ser 8.29 (H) 0.44 - 1.00 mg/dL   Calcium 9.5 8.9 - 56.2 mg/dL   GFR, Estimated 47 (L) >60 mL/min    Comment: (NOTE) Calculated using the CKD-EPI Creatinine Equation (2021)    Anion gap  15 5 - 15    Comment: Performed at [facility], [address] [place], [place] [address]  CBC     Status: Abnormal   Collection Time: 02/14/23  1:31 PM  Result Value Ref Range   WBC 3.7 (L) 4.0 - 10.5 K/uL   RBC 4.92 3.87 - 5.11 MIL/uL   Hemoglobin 15.2 (H) 12.0 - 15.0 g/dL   HCT 57.8 46.9 - 62.9 %   MCV 93.1 80.0 - 100.0 fL   MCH 30.9 26.0 - 34.0 pg   MCHC 33.2 30.0 - 36.0 g/dL   RDW 52.8 41.3 - 24.4 %   Platelets 372 150 - 400 K/uL   nRBC 0.0 0.0 - 0.2 %    Comment: Performed at [facility], [address], [place], [place] [address]  CBG monitoring, ED     Status: Abnormal   Collection Time: 02/14/23  1:33 PM  Result Value Ref Range   Glucose-Capillary 157 (H) 70 - 99 mg/dL    Comment: Glucose reference range applies only to samples taken after fasting for at least 8 hours.  Troponin I (High Sensitivity)     Status: None   Collection Time: 02/14/23  2:22 PM  Result Value Ref Range   Troponin I (High Sensitivity) 11 <18 ng/L    Comment: (NOTE) Elevated high sensitivity troponin I (hsTnI) values and significant  changes across serial measurements may suggest ACS but many other  chronic and acute conditions are known to elevate hsTnI results.  Refer to the "Links" section for chest pain algorithms and additional  guidance. Performed at [facility], [address], [place], [place] [address]   Magnesium     Status: Abnormal   Collection Time: 02/14/23  2:22 PM  Result Value Ref Range   Magnesium 1.5 (L) 1.7 - 2.4 mg/dL    Comment: Performed at [facility], [address], [place], [place] [address]  Resp panel by RT-PCR (RSV, Flu A&B, Covid) Anterior Nasal Swab     Status: None   Collection Time: 02/14/23  3:17 PM   Specimen: Anterior Nasal Swab  Result Value Ref Range   SARS Coronavirus 2 by RT PCR NEGATIVE NEGATIVE   Influenza A by PCR NEGATIVE NEGATIVE   Influenza B by PCR NEGATIVE NEGATIVE    Comment: (NOTE) The Xpert Xpress  SARS-CoV-2/FLU/RSV plus assay is intended as an aid in the diagnosis of influenza from Nasopharyngeal swab specimens and should not be used as a sole basis for treatment. Nasal washings and aspirates are unacceptable for Xpert Xpress SARS-CoV-2/FLU/RSV testing.  Fact Sheet for Patients: BloggerCourse.com  Fact Sheet for Healthcare Providers: SeriousBroker.it  This test is not yet approved or cleared by the [place] FDA and has been authorized for detection and/or diagnosis of SARS-CoV-2 by FDA under an Emergency Use Authorization (EUA). This EUA will remain in effect (meaning this test can be used) for the duration of the COVID-19 declaration under Section 564(b)(1) of the Act, 21 U.S.C. section 360bbb-3(b)(1), unless the authorization is terminated or revoked.     Resp Syncytial Virus by PCR NEGATIVE NEGATIVE    Comment: (NOTE) Fact Sheet for Patients: BloggerCourse.com  Fact Sheet for Healthcare Providers: SeriousBroker.it  This test is not yet approved or cleared by the [place] FDA and has been authorized for detection and/or diagnosis of SARS-CoV-2 by FDA under an Emergency Use Authorization (EUA). This EUA will remain in effect (meaning this test can be used) for the duration of the COVID-19 declaration under Section 564(b)(1) of the Act, 21 U.S.C. section 360bbb-3(b)(1), unless the authorization is terminated or revoked.  Performed at [facility], [address], [place], [place] [address]   Brain natriuretic peptide     Status: None   Collection Time: 02/14/23  3:59 PM  Result Value Ref Range   B Natriuretic Peptide 22.2 0.0 - 100.0 pg/mL    Comment: Performed at [facility], [address] [place], [place] [address]  Troponin I (High Sensitivity)     Status: None   Collection Time: 02/14/23  4:17 PM  Result Value Ref Range   Troponin I  (High Sensitivity) 10 <18 ng/L    Comment: (NOTE) Elevated high sensitivity troponin I (hsTnI) values and significant  changes across serial measurements may suggest ACS but many other  chronic and acute conditions are known to elevate hsTnI results.  Refer to the "Links" section for chest pain algorithms and additional  guidance. Performed at [facility], [address] [place], [place] [address]   CBC     Status: Abnormal   Collection Time: 02/14/23  4:17 PM  Result Value Ref Range   WBC 3.2 (L) 4.0 - 10.5 K/uL   RBC 4.57 3.87 - 5.11 MIL/uL   Hemoglobin 14.0 12.0 - 15.0 g/dL   HCT 91.4 78.2 - 95.6 %   MCV 90.6 80.0 - 100.0 fL   MCH 30.6 26.0 - 34.0 pg   MCHC 33.8 30.0 - 36.0 g/dL   RDW 21.3 08.6 - 57.8 %   Platelets 350 150 - 400 K/uL   nRBC 0.0 0.0 - 0.2 %    Comment: Performed at [facility], [address] [place], [place] [address]  Creatinine, serum     Status: Abnormal   Collection Time: 02/14/23  4:17 PM  Result Value Ref Range   Creatinine, Ser 1.06 (H) 0.44 - 1.00 mg/dL   GFR, Estimated 51 (L) >60 mL/min    Comment: (NOTE) Calculated using the CKD-EPI Creatinine Equation (2021) Performed at [facility], [address] [place], [place] [address]    CT Angio Chest PE W and/or Wo Contrast  Result Date: 02/14/2023 CLINICAL DATA:  High clinical suspicion for PE EXAM: CT ANGIOGRAPHY CHEST WITH CONTRAST TECHNIQUE: Multidetector CT imaging of the chest was performed using the standard protocol during bolus administration of intravenous contrast. Multiplanar CT image reconstructions and MIPs were obtained to evaluate the vascular anatomy. RADIATION DOSE REDUCTION: This exam was performed according to the departmental dose-optimization program which includes automated exposure control, adjustment of the mA  and/or kV according to patient size and/or use of iterative reconstruction technique. CONTRAST:  65mL OMNIPAQUE IOHEXOL 350 MG/ML SOLN COMPARISON:  CT  done on 09/11/2021, chest radiographs done on 01/30/2023 FINDINGS: Cardiovascular: There are no intraluminal filling defects seen pulmonary artery branches. Coronary artery calcifications are seen. Heart is enlarged in size. Contrast density in thoracic aorta is less than optimal to evaluate the lumen. Mediastinum/Nodes: Slightly enlarged lymph nodes are seen in mediastinum. Lungs/Pleura: Small patchy infiltrates are seen in the lower lung fields on both sides suggesting subsegmental atelectasis/pneumonia. There is no pleural effusion or pneumothorax. Upper Abdomen: Small hiatal hernia is seen. Musculoskeletal: Slight decrease in height of the body T2 vertebra has not changed. Degenerative changes are noted in thoracic spine. Review of the MIP images confirms the above findings. IMPRESSION: There is no evidence of pulmonary artery embolism. Coronary artery disease. Aortic arteriosclerosis. Coronary artery disease. Small patchy infiltrates are seen in both lower lung fields suggesting subsegmental atelectasis/pneumonia. Small hiatal hernia. Electronically Signed   By: Ernie Avena M.D.   On: 02/14/2023 16:42    Pending Labs Unresulted Labs (From admission, onward)     Start     Ordered   02/15/23 0500  Basic metabolic panel  Tomorrow morning,   R        02/14/23 1800   02/15/23 0500  CBC  Tomorrow morning,   R        02/14/23 1800   02/14/23 1701  Blood culture (routine x 2)  BLOOD CULTURE X 2,   R (with STAT occurrences)      02/14/23 1701   02/14/23 1701  Lactic acid, plasma  Now then every 2 hours,   R (with STAT occurrences)      02/14/23 1701   02/14/23 1310  Urinalysis, Routine w reflex microscopic -Urine, Clean Catch  Once,   URGENT       Question:  Specimen Source  Answer:  Urine, Clean Catch   02/14/23 1309            Vitals/Pain Today's Vitals   02/14/23 1630 02/14/23 1656 02/14/23 1730 02/14/23 1900  BP: 135/71  (!) 130/97 (!) 158/92  Pulse: 77  74 85  Resp: 14  13 16    Temp:  97.9 F (36.6 C)    TempSrc:  Oral    SpO2: 100%  98% 98%  Weight:      Height:      PainSc:        Isolation Precautions No active isolations  Medications Medications  potassium chloride 10 mEq in 100 mL IVPB (0 mEq Intravenous Stopped 02/14/23 1924)  cefTRIAXone (ROCEPHIN) 1 g in sodium chloride 0.9 % 100 mL IVPB (has no administration in time range)  azithromycin (ZITHROMAX) 500 mg in sodium chloride 0.9 % 250 mL IVPB (has no administration in time range)  albuterol (PROVENTIL) (2.5 MG/3ML) 0.083% nebulizer solution 3 mL (has no administration in time range)  feeding supplement (ENSURE ENLIVE / ENSURE PLUS) liquid 237 mL (has no administration in time range)  HYDROcodone-acetaminophen (NORCO/VICODIN) 5-325 MG per tablet 1 tablet (has no administration in time range)  hydrocortisone (CORTEF) tablet 10 mg (has no administration in time range)  hydrocortisone (CORTEF) tablet 20 mg (has no administration in time range)  losartan (COZAAR) tablet 25 mg (has no administration in time range)  meclizine (ANTIVERT) tablet 25 mg (has no administration in time range)  PreserVision AREDS 2 CAPS 1 capsule (has no administration in time range)  rosuvastatin (CRESTOR)  tablet 20 mg (has no administration in time range)  heparin injection 5,000 Units (has no administration in time range)  0.9 %  sodium chloride infusion ( Intravenous New Bag/Given 02/14/23 1838)  ondansetron (ZOFRAN) injection 4 mg (4 mg Intravenous Given 02/14/23 1424)  hydrocortisone sodium succinate (SOLU-CORTEF) 100 MG injection 100 mg (100 mg Intravenous Given 02/14/23 1513)  lactated ringers bolus 500 mL (0 mLs Intravenous Stopped 02/14/23 1754)  magnesium sulfate IVPB 2 g 50 mL (0 g Intravenous Stopped 02/14/23 1743)  iohexol (OMNIPAQUE) 350 MG/ML injection 65 mL (65 mLs Intravenous Contrast Given 02/14/23 1622)    Mobility walks with person assist     Focused Assessments     R Recommendations: See Admitting Provider  Note  Report given to:   Additional Notes: on 4lpm [place] / A&O / uses bedpan

## 2023-02-14 NOTE — H&P (Signed)
Referring Physician: Virgina Norfolk, DO  Adriana Jordan is an 88 y.o. female.                       Chief Complaint: Weakness  HPI: 88 years old black female with PMH of Adrenal insufficiency, pituitary adenoma- s/p surgery and radiation Tx, HTN, HLD and CAD had another episode of weakness and hypotension post bilateral lower lobe pneumonia. She may have some confusion over taking her medications mostly Cortef for few days. She was hypotensive on arrival to ED. Post fluid resuscitation her BP has improved. She has significant hypokalemia, hypomagnesemia and hyponatremia. She has decreased confusion post 100 mg. of solu-cortef IV.  Past Medical History:  Diagnosis Date   Arthritis    "all over" (11/14/2017)   Brain tumor (HCC)    "I still have it"; not cancer (11/14/2017)   Chronic lower back pain    Coronary artery disease    Dr. Algie Coffer   GERD (gastroesophageal reflux disease)    History of blood transfusion    "don't remember why" (11/14/2017)   History of radiation therapy 07/03/2018   brain, pituitary/ 12.5 Gy in 1 fraction   Hypercholesteremia    Hypertension    MI (myocardial infarction) (HCC) 2006   PONV (postoperative nausea and vomiting)    Skin cancer    "right forearm"      Past Surgical History:  Procedure Laterality Date   ANTERIOR CERVICAL DECOMP/DISCECTOMY FUSION     BACK SURGERY     CARPAL TUNNEL RELEASE Bilateral    CORONARY ANGIOPLASTY     DES mid LAD and mid RCA 09/21/04   CRANIOTOMY N/A 05/21/2015   Procedure: Transsphenoidal resection of pituitary tumor with Dr. Narda Bonds for approach;  Surgeon: Hilda Lias, MD;  Location: [facility] NEURO ORS;  Service: Neurosurgery;  Laterality: N/A;  Transsphenoidal resection of pituitary tumor with Dr. Narda Bonds for approach   SHOULDER OPEN ROTATOR CUFF REPAIR Bilateral    SKIN CANCER EXCISION Right    forearm   thumb surgery Right    placed srews to make straight   THYROID SURGERY     "goiter removed"    Family  History  Problem Relation Age of Onset   Cancer Father 60       throat- smoker    Breast cancer Sister 73   Cancer Brother 16       lung- smoker   Social History:  reports that she quit smoking about 53 years ago. Her smoking use included cigarettes. She has a 0.60 pack-year smoking history. She has never used smokeless tobacco. She reports that she does not currently use alcohol. She reports that she does not use drugs.  Allergies:  Allergies  Allergen Reactions   Codeine Nausea And Vomiting    Tolerates hydrocodone    (Not in a hospital admission)   Results for orders placed or performed during the hospital encounter of 02/14/23 (from the past 48 hour(s))  Basic metabolic panel     Status: Abnormal   Collection Time: 02/14/23  1:31 PM  Result Value Ref Range   Sodium 127 (L) 135 - 145 mmol/L   Potassium 2.9 (L) 3.5 - 5.1 mmol/L   Chloride 95 (L) 98 - 111 mmol/L   CO2 17 (L) 22 - 32 mmol/L   Glucose, Bld 141 (H) 70 - 99 mg/dL    Comment: Glucose reference range applies only to samples taken after fasting for at least 8 hours.  BUN 7 (L) 8 - 23 mg/dL   Creatinine, Ser 1.61 (H) 0.44 - 1.00 mg/dL   Calcium 9.5 8.9 - 09.6 mg/dL   GFR, Estimated 47 (L) >60 mL/min    Comment: (NOTE) Calculated using the CKD-EPI Creatinine Equation (2021)    Anion gap 15 5 - 15    Comment: Performed at [facility], [address] [place], [place] [address]  CBC     Status: Abnormal   Collection Time: 02/14/23  1:31 PM  Result Value Ref Range   WBC 3.7 (L) 4.0 - 10.5 K/uL   RBC 4.92 3.87 - 5.11 MIL/uL   Hemoglobin 15.2 (H) 12.0 - 15.0 g/dL   HCT 98.1 19.1 - 47.8 %   MCV 93.1 80.0 - 100.0 fL   MCH 30.9 26.0 - 34.0 pg   MCHC 33.2 30.0 - 36.0 g/dL   RDW 29.5 62.1 - 30.8 %   Platelets 372 150 - 400 K/uL   nRBC 0.0 0.0 - 0.2 %    Comment: Performed at [facility], [address], [place], [place] [address]  CBG monitoring, ED     Status: Abnormal   Collection Time: 02/14/23   1:33 PM  Result Value Ref Range   Glucose-Capillary 157 (H) 70 - 99 mg/dL    Comment: Glucose reference range applies only to samples taken after fasting for at least 8 hours.  Troponin I (High Sensitivity)     Status: None   Collection Time: 02/14/23  2:22 PM  Result Value Ref Range   Troponin I (High Sensitivity) 11 <18 ng/L    Comment: (NOTE) Elevated high sensitivity troponin I (hsTnI) values and significant  changes across serial measurements may suggest ACS but many other  chronic and acute conditions are known to elevate hsTnI results.  Refer to the "Links" section for chest pain algorithms and additional  guidance. Performed at [facility], [address] [place], [place] [address]   Magnesium     Status: Abnormal   Collection Time: 02/14/23  2:22 PM  Result Value Ref Range   Magnesium 1.5 (L) 1.7 - 2.4 mg/dL    Comment: Performed at [facility], [address], [place], [place] [address]  Resp panel by RT-PCR (RSV, Flu A&B, Covid) Anterior Nasal Swab     Status: None   Collection Time: 02/14/23  3:17 PM   Specimen: Anterior Nasal Swab  Result Value Ref Range   SARS Coronavirus 2 by RT PCR NEGATIVE NEGATIVE   Influenza A by PCR NEGATIVE NEGATIVE   Influenza B by PCR NEGATIVE NEGATIVE    Comment: (NOTE) The Xpert Xpress SARS-CoV-2/FLU/RSV plus assay is intended as an aid in the diagnosis of influenza from Nasopharyngeal swab specimens and should not be used as a sole basis for treatment. Nasal washings and aspirates are unacceptable for Xpert Xpress SARS-CoV-2/FLU/RSV testing.  Fact Sheet for Patients: BloggerCourse.com  Fact Sheet for Healthcare Providers: SeriousBroker.it  This test is not yet approved or cleared by the [place] FDA and has been authorized for detection and/or diagnosis of SARS-CoV-2 by FDA under an Emergency Use Authorization (EUA). This EUA will remain in effect (meaning this  test can be used) for the duration of the COVID-19 declaration under Section 564(b)(1) of the Act, 21 U.S.C. section 360bbb-3(b)(1), unless the authorization is terminated or revoked.     Resp Syncytial Virus by PCR NEGATIVE NEGATIVE    Comment: (NOTE) Fact Sheet for Patients: BloggerCourse.com  Fact Sheet for Healthcare Providers: SeriousBroker.it  This test is not yet approved or cleared by the [place] FDA and has been authorized for detection and/or diagnosis of SARS-CoV-2 by FDA under an Emergency Use Authorization (EUA). This EUA will remain in effect (meaning this test can be used) for the duration of the COVID-19 declaration under Section 564(b)(1) of the Act, 21 U.S.C. section 360bbb-3(b)(1), unless the authorization is terminated or revoked.  Performed at [facility], [address], [place], [place] [address]    CT Angio Chest PE W and/or Wo Contrast  Result Date: 02/14/2023 CLINICAL DATA:  High clinical suspicion for PE EXAM: CT ANGIOGRAPHY CHEST WITH CONTRAST TECHNIQUE: Multidetector CT imaging of the chest was performed using the standard protocol during bolus administration of intravenous contrast. Multiplanar CT image reconstructions and MIPs were obtained to evaluate the vascular anatomy. RADIATION DOSE REDUCTION: This exam was performed according to the departmental dose-optimization program which includes automated exposure control, adjustment of the mA and/or kV according to patient size and/or use of iterative reconstruction technique. CONTRAST:  65mL OMNIPAQUE IOHEXOL 350 MG/ML SOLN COMPARISON:  CT done on 09/11/2021, chest radiographs done on 01/30/2023 FINDINGS: Cardiovascular: There are no intraluminal filling defects seen pulmonary artery branches. Coronary artery calcifications are seen. Heart is enlarged in size. Contrast density in thoracic aorta is less than optimal to evaluate the lumen.  Mediastinum/Nodes: Slightly enlarged lymph nodes are seen in mediastinum. Lungs/Pleura: Small patchy infiltrates are seen in the lower lung fields on both sides suggesting subsegmental atelectasis/pneumonia. There is no pleural effusion or pneumothorax. Upper Abdomen: Small hiatal hernia is seen. Musculoskeletal: Slight decrease in height of the body T2 vertebra has not changed. Degenerative changes are noted in thoracic spine. Review of the MIP images confirms the above findings. IMPRESSION: There is no evidence of pulmonary artery embolism. Coronary artery disease. Aortic arteriosclerosis. Coronary artery disease. Small patchy infiltrates are seen in both lower lung fields suggesting subsegmental atelectasis/pneumonia. Small hiatal hernia. Electronically Signed   By: Ernie Avena M.D.   On: 02/14/2023 16:42    Review Of Systems Constitutional: No fever, chills, weight loss or gain. Eyes: No vision change, wears glasses. No discharge or pain. Ears: No hearing loss, No tinnitus. Respiratory: No asthma, COPD, pneumonias. Positive shortness of breath. No hemoptysis. Cardiovascular: Positive chest pain, palpitation, leg edema. Gastrointestinal: No nausea, vomiting, diarrhea, constipation. No GI bleed. No hepatitis. Genitourinary: No dysuria, hematuria, kidney stone. No incontinance. Neurological: No headache, stroke, seizures.  Psychiatry: No psych facility admission for anxiety, depression, suicide. No detox. Skin: No rash. Musculoskeletal: Positive joint pain, fibromyalgia, neck pain, back pain. Lymphadenopathy: No lymphadenopathy. Hematology: No anemia or easy bruising.   Blood pressure 135/71, pulse 77, temperature 97.9 F (36.6 C), temperature source Oral, resp. rate 14, height 5\' 6"  (1.676 m), weight 65.4 kg, SpO2 100 %. Body mass index is 23.27 kg/m. General appearance: alert, cooperative, appears stated age and no distress Head: Normocephalic, atraumatic. Eyes: Brown eyes, pink  conjunctiva, corneas clear.  Neck: No adenopathy, no carotid bruit, no JVD, supple, symmetrical, trachea midline and thyroid not enlarged. Resp: Clearing to auscultation bilaterally. Cardio: Regular rate and rhythm, S1, S2 normal, II/VI systolic murmur, no click, rub or gallop GI: Soft, non-tender; bowel sounds normal; no organomegaly. Extremities: No edema, cyanosis or clubbing. Skin: Warm and dry.  Neurologic: Alert and oriented X 2, normal strength. Normal coordination.  Assessment/Plan Community acquired pneumonia Acute recurrent adrenal insufficiency Severe hypokalemia Moderate hypomagnesemia Mild Hyponatremia Weakness due to  above CAD S/P Coronary stents HTN HLD S/P pituitary adenoma  Dehydration  Plan: IV antibiotics. Resume cortef + home medications.  Time spent: Review of old records, Lab, x-rays, EKG, other cardiac tests, examination, discussion with patient/Doctor/Nurse over 70 minutes.  Ricki Rodriguez, MD  02/14/2023, 6:00 PM

## 2023-02-14 NOTE — ED Provider Notes (Signed)
[place] EMERGENCY DEPARTMENT AT [facility] Provider Note   CSN: [id number] Arrival date & time: 02/14/23  1302     History {Add pertinent medical, surgical, social history, OB history to HPI:1} Chief Complaint  Patient presents with   Weakness   Failure To Thrive    Adriana Jordan is a 88 y.o. female.   Weakness      Home Medications Prior to Admission medications   Medication Sig Start Date End Date Taking? Authorizing Provider  albuterol (VENTOLIN HFA) 108 (90 Base) MCG/ACT inhaler Inhale 2 puffs into the lungs every 6 (six) hours as needed for shortness of breath. 11/22/22   [provider]  feeding supplement (ENSURE ENLIVE / ENSURE PLUS) LIQD Take 237 mLs by mouth 2 (two) times daily between meals. 06/23/22   Hongalgi, Maximino Greenland, MD  Homeopathic Products (LEG CRAMP RELIEF PO) Take 1 tablet by mouth every evening.    [provider]  HYDROcodone-acetaminophen (NORCO/VICODIN) 5-325 MG tablet Take 1 tablet by mouth every 6 (six) hours as needed for moderate pain. 12/30/22   [provider]  hydrocortisone (CORTEF) 10 MG tablet Take 1 tablet (10 mg total) by mouth every evening. 06/23/22   Hongalgi, Maximino Greenland, MD  hydrocortisone (CORTEF) 20 MG tablet Take 1 tablet (20 mg total) by mouth every morning. Patient not taking: Reported on 01/30/2023 03/06/22   Hartley Barefoot A, MD  losartan (COZAAR) 25 MG tablet Take 25 mg by mouth daily. 11/10/22   [provider]  meclizine (ANTIVERT) 25 MG tablet Take 25 mg by mouth 2 (two) times daily as needed for dizziness. 08/25/22   [provider]  Multiple Vitamins-Minerals (PRESERVISION AREDS 2) CAPS Take 1 capsule by mouth every morning.    [provider]  rosuvastatin (CRESTOR) 20 MG tablet Take 20 mg by mouth every evening. 10/15/22   [provider]      Allergies    Codeine    Review of Systems   Review of Systems  Neurological:  Positive for weakness.     Physical Exam Updated Vital Signs BP 119/69   Pulse 81   Temp 97.9 F (36.6 C) (Oral)   Resp 16   Ht 5\' 6"  (1.676 m)   Wt 65.4 kg   LMP  (LMP Unknown)   SpO2 95%   BMI 23.27 kg/m  Physical Exam  ED Results / Procedures / Treatments   Labs (all labs ordered are listed, but only abnormal results are displayed) Labs Reviewed  CBG MONITORING, ED - Abnormal; Notable for the following components:      Result Value   Glucose-Capillary 157 (*)    All other components within normal limits  BASIC METABOLIC PANEL  CBC  URINALYSIS, ROUTINE W REFLEX MICROSCOPIC    EKG None  Radiology No results found.  Procedures Procedures  {Document cardiac monitor, telemetry assessment procedure when appropriate:1}  Medications Ordered in ED Medications - No data to display  ED Course/ Medical Decision Making/ A&P   {   Click here for ABCD2, HEART and other calculatorsREFRESH Note before signing :1}                          Medical Decision Making Amount and/or Complexity of Data Reviewed Labs: ordered.   ***  {Document critical care time when appropriate:1} {Document review of labs and clinical decision tools ie heart score, Chads2Vasc2 etc:1}  {Document your independent review of radiology images, and  any outside records:1} {Document your discussion with family members, caretakers, and with consultants:1} {Document social determinants of health affecting pt's care:1} {Document your decision making why or why not admission, treatments were needed:1} Final Clinical Impression(s) / ED Diagnoses Final diagnoses:  None    Rx / DC Orders ED Discharge Orders     None

## 2023-02-14 NOTE — ED Notes (Signed)
Pt placed on [place] and EDP made aware of O2 sat.  Sat remained in mid 80s  w/ 4L [place].  Pt placed on 15L NRB.

## 2023-02-14 NOTE — ED Notes (Signed)
Lab notified of add on labs.

## 2023-02-14 NOTE — ED Notes (Addendum)
Unable to obtain labs.  Will call phlebotomy to attempt.   NRB removed and Pt placed on 5L [place] for trial.

## 2023-02-14 NOTE — ED Triage Notes (Signed)
Pt BIB [employer] from home for complaints of dizziness and failure to thrive. Pt states she hasn't had any medication since her last visit to the ER 2 weeks ago because she doesn't have any. Pt lives with son and told EMS that pt refused to eat and staying in bed. Denies any pain. A&O x 4

## 2023-02-14 NOTE — ED Notes (Signed)
Unsuccessful IV and lab attempt x2.  IV team consulted.

## 2023-02-14 NOTE — ED Notes (Signed)
Pt's son at bedside.  Sts this has been going on intermittently for a few weeks.  Denies n/v at home. Sts the Pt "doesn't eat."

## 2023-02-15 LAB — BASIC METABOLIC PANEL
Anion gap: 18 — ABNORMAL HIGH (ref 5–15)
BUN: 6 mg/dL — ABNORMAL LOW (ref 8–23)
CO2: 14 mmol/L — ABNORMAL LOW (ref 22–32)
Calcium: 9.3 mg/dL (ref 8.9–10.3)
Chloride: 99 mmol/L (ref 98–111)
Creatinine, Ser: 1.09 mg/dL — ABNORMAL HIGH (ref 0.44–1.00)
GFR, Estimated: 49 mL/min — ABNORMAL LOW (ref >60)
Glucose, Bld: 164 mg/dL — ABNORMAL HIGH (ref 70–99)
Potassium: 4.2 mmol/L (ref 3.5–5.1)
Sodium: 131 mmol/L — ABNORMAL LOW (ref 135–145)

## 2023-02-15 LAB — CBC
HCT: 46.4 % — ABNORMAL HIGH (ref 36.0–46.0)
Hemoglobin: 15.6 g/dL — ABNORMAL HIGH (ref 12.0–15.0)
MCH: 30.8 pg (ref 26.0–34.0)
MCHC: 33.6 g/dL (ref 30.0–36.0)
MCV: 91.7 fL (ref 80.0–100.0)
Platelets: 387 10*3/uL (ref 150–400)
RBC: 5.06 MIL/uL (ref 3.87–5.11)
RDW: 13.1 % (ref 11.5–15.5)
WBC: 3.2 10*3/uL — ABNORMAL LOW (ref 4.0–10.5)
nRBC: 0 % (ref 0.0–0.2)

## 2023-02-15 LAB — LACTIC ACID, PLASMA: Lactic Acid, Venous: 2 mmol/L (ref 0.5–1.9)

## 2023-02-15 MED ORDER — SODIUM CHLORIDE 0.9 % IV SOLN
500.0000 mg | INTRAVENOUS | Status: AC
  Administered 2023-02-15 – 2023-02-16 (×2): 500 mg via INTRAVENOUS
  Filled 2023-02-15 (×2): qty 5

## 2023-02-15 MED ORDER — SODIUM CHLORIDE 0.9 % IV SOLN
1.0000 g | INTRAVENOUS | Status: AC
  Administered 2023-02-15 – 2023-02-19 (×5): 1 g via INTRAVENOUS
  Filled 2023-02-15 (×5): qty 10

## 2023-02-15 MED ORDER — METOPROLOL TARTRATE 12.5 MG HALF TABLET
12.5000 mg | ORAL_TABLET | Freq: Two times a day (BID) | ORAL | Status: DC
  Administered 2023-02-15 – 2023-02-20 (×10): 12.5 mg via ORAL
  Filled 2023-02-15 (×11): qty 1

## 2023-02-15 NOTE — Progress Notes (Signed)
Ref: Orpah Cobb, MD   Subjective:  VS stable. Weakness continues.  Objective:  Vital Signs in the last 24 hours: Temp:  [97.8 F (36.6 C)-98 F (36.7 C)] 98 F (36.7 C) (07/09 1618) Pulse Rate:  [74-101] 74 (07/09 1618) Cardiac Rhythm: Normal sinus rhythm (07/09 1900) Resp:  [16-18] 18 (07/09 1618) BP: (97-144)/(60-98) 97/60 (07/09 1618) SpO2:  [89 %-98 %] 94 % (07/09 1736)  Physical Exam: BP Readings from Last 1 Encounters:  02/15/23 97/60     Wt Readings from Last 1 Encounters:  02/14/23 65.4 kg    Weight change:  Body mass index is 23.27 kg/m. HEENT: [place]/AT, Eyes-Brown,  Conjunctiva-Pink, Sclera-Non-icteric Neck: No JVD, No bruit, Trachea midline. Lungs:  Clear, Bilateral. Cardiac:  Regular rhythm, normal S1 and S2, no S3. II/VI systolic murmur. Abdomen:  Soft, non-tender. BS present. Extremities:  No edema present. No cyanosis. No clubbing. CNS: AxOx3, Cranial nerves grossly intact, moves all 4 extremities.  Skin: Warm and dry.   Intake/Output from previous day: 07/08 0701 - 07/09 0700 In: 836.7 [I.V.:836.7] Out: 1100 [Urine:1100]    Lab Results: BMET    Component Value Date/Time   NA 131 (L) 02/15/2023 0321   NA 127 (L) 02/14/2023 1331   NA 139 01/31/2023 0437   K 4.2 02/15/2023 0321   K 2.9 (L) 02/14/2023 1331   K 3.1 (L) 01/31/2023 0437   CL 99 02/15/2023 0321   CL 95 (L) 02/14/2023 1331   CL 105 01/31/2023 0437   CO2 14 (L) 02/15/2023 0321   CO2 17 (L) 02/14/2023 1331   CO2 20 (L) 01/31/2023 0437   GLUCOSE 164 (H) 02/15/2023 0321   GLUCOSE 141 (H) 02/14/2023 1331   GLUCOSE 95 01/31/2023 0437   BUN 6 (L) 02/15/2023 0321   BUN 7 (L) 02/14/2023 1331   BUN 10 01/31/2023 0437   CREATININE 1.09 (H) 02/15/2023 0321   CREATININE 1.06 (H) 02/14/2023 1617   CREATININE 1.14 (H) 02/14/2023 1331   CALCIUM 9.3 02/15/2023 0321   CALCIUM 9.5 02/14/2023 1331   CALCIUM 8.8 (L) 01/31/2023 0437   GFRNONAA 49 (L) 02/15/2023 0321   GFRNONAA 51 (L)  02/14/2023 1617   GFRNONAA 47 (L) 02/14/2023 1331   GFRAA 46 (L) 11/15/2017 0439   GFRAA 40 (L) 11/14/2017 1250   GFRAA 56 (L) 11/09/2017 1341   CBC    Component Value Date/Time   WBC 3.2 (L) 02/15/2023 0321   RBC 5.06 02/15/2023 0321   HGB 15.6 (H) 02/15/2023 0321   HCT 46.4 (H) 02/15/2023 0321   PLT 387 02/15/2023 0321   MCV 91.7 02/15/2023 0321   MCH 30.8 02/15/2023 0321   MCHC 33.6 02/15/2023 0321   RDW 13.1 02/15/2023 0321   LYMPHSABS 1.2 01/30/2023 1112   MONOABS 0.3 01/30/2023 1112   EOSABS 0.1 01/30/2023 1112   BASOSABS 0.0 01/30/2023 1112   HEPATIC Function Panel Recent Labs    06/22/22 0539 06/23/22 0555 01/30/23 1112  PROT 7.3 6.5 6.4*  ALBUMIN 3.6 3.3* 3.4*  AST 42* 28 35  ALT 20 21 15   ALKPHOS 75 65 56   HEMOGLOBIN A1C Lab Results  Component Value Date   MPG 116.89 06/22/2022   CARDIAC ENZYMES Lab Results  Component Value Date   CKTOTAL 284 (H) 01/30/2023   BNP No results for input(s): "PROBNP" in the last 8760 hours. TSH Recent Labs    02/23/22 0602 06/21/22 1400  TSH 3.875 3.088   CHOLESTEROL No results for input(s): "CHOL" in the  last 8760 hours.  Scheduled Meds:  feeding supplement  237 mL Oral BID BM   heparin  5,000 Units Subcutaneous Q8H   hydrocortisone  10 mg Oral QPM   hydrocortisone  20 mg Oral BH-q7a   losartan  25 mg Oral Daily   metoprolol tartrate  12.5 mg Oral BID   multivitamin  1 tablet Oral q morning   rosuvastatin  20 mg Oral QPM   Continuous Infusions:  sodium chloride 50 mL/hr at 02/15/23 1826   azithromycin (ZITHROMAX) 500 mg in sodium chloride 0.9 % 250 mL IVPB Stopped (02/15/23 1310)   cefTRIAXone (ROCEPHIN)  IV Stopped (02/15/23 1029)   PRN Meds:.albuterol, HYDROcodone-acetaminophen, meclizine  Assessment/Plan: Community acquired pneumonia Acute recurrent adrenal insufficiency Severe hypokalemia Moderate hypomagnesemia Mild Hyponatremia Weakness due to above CAD S/P Coronary stents HTN HLD S/P  pituitary adenoma  Dehydration  Plan: Continue gentle hydration. Resume oral cortef.   LOS: 1 day   Time spent including chart review, lab review, examination, discussion with patient/Nurse : 30 min   Orpah Cobb  MD  02/15/2023, 8:53 PM

## 2023-02-15 NOTE — TOC Initial Note (Addendum)
Transition of Care [facility]) - Initial/Assessment Note    Patient Details  Name: Adriana Jordan MRN: [id number] Date of Birth: [birth year]  [facility] CM/SW Contact:    Tom-Johnson, Hershal Coria, RN Phone Number: 02/15/2023, 1:25 PM  Clinical Narrative:                  CM spoke with patient at bedside about needs for post hospital transition.  Patient presented to the Hospital with Generalized Weakness and Dizziness, patient has not been eating or drinking much in the last few days prior to admission. Currently on 2L O2 acute, does not use home O2.  Found to have Community Acquired Pneumonia, on IV abx and IV Steroid.   From home with son Delila Pereyra. States she is independent prior to admission. Tyrone assists as needed.  Has a cane and walker at home. Does not have a PCP, her Cardiologist Orpah Cobb, MD does her Medication  Management.   Awaits PT/OT eval for disposition.  CM will continue to follow as patient progresses with care towards discharge.          Barriers to Discharge: Continued Medical Work up   Patient Goals and CMS Choice Patient states their goals for this hospitalization and ongoing recovery are:: To return home CMS Medicare.gov Compare Post Acute Care list provided to:: Patient        Expected Discharge Plan and Services   Discharge Planning Services: CM Consult   Living arrangements for the past 2 months: Single Family Home                                      Prior Living Arrangements/Services Living arrangements for the past 2 months: Single Family Home Lives with:: Adult Children (Son, Teacher, early years/pre) Patient language and need for interpreter reviewed:: Yes Do you feel safe going back to the place where you live?: Yes      Need for Family Participation in Patient Care: Yes (Comment) Care giver support system in place?: Yes (comment) Current home services: DME Gilmer Mor, Dan Humphreys) Criminal Activity/Legal Involvement Pertinent to  Current Situation/Hospitalization: No - Comment as needed  Activities of Daily Living      Permission Sought/Granted Permission sought to share information with : Case Manager, Family Supports Permission granted to share information with : Yes, Verbal Permission Granted              Emotional Assessment Appearance:: Appears stated age Attitude/Demeanor/Rapport: Engaged, Gracious Affect (typically observed): Accepting, Appropriate, Calm, Hopeful Orientation: : Oriented to Self, Oriented to Place, Oriented to  Time, Oriented to Situation Alcohol / Substance Use: Not Applicable Psych Involvement: No (comment)  Admission diagnosis:  Hypokalemia [E87.6] Hypomagnesemia [E83.42] Hyponatremia [E87.1] Community acquired pneumonia [J18.9] Acute respiratory failure with hypoxia (HCC) [J96.01] Community acquired pneumonia, unspecified laterality [J18.9] Patient Active Problem List   Diagnosis Date Noted   Community acquired pneumonia 02/14/2023   Headache 06/21/2022   Adrenal insufficiency (HCC) 03/05/2022   Protein-calorie malnutrition, severe 03/03/2022   Hypokalemia 03/01/2022   FTT (failure to thrive) in adult 03/01/2022   Generalized weakness 03/01/2022   Protein-calorie malnutrition, moderate (HCC) 03/01/2022   Multiple falls 03/01/2022   Tick bite 03/01/2022   Hyponatremia 02/21/2022   Chest pain, atypical 09/11/2021   Stroke (HCC) 10/11/2019   Leukopenia 11/15/2017   Acute respiratory failure with hypoxia (HCC) 11/15/2017   AKI (acute kidney injury) (HCC) 11/15/2017  Elevated CK 11/15/2017   Influenza A 11/14/2017   Hypertension 11/14/2017   Hyperlipidemia 11/14/2017   GERD (gastroesophageal reflux disease) 11/14/2017   Adenoma 05/21/2015   Pituitary tumor (HCC) 05/21/2015   Pituitary adenoma (HCC) 04/07/2015   PCP:  Orpah Cobb, MD Pharmacy:   [facility] [place] [place] - [address] [place] [address] Phone: [phone] Fax:  [phone]  [facility] [address] [place] [address] Phone: [phone] Fax: [phone]     Social Determinants of Health (SDOH) Social History: SDOH Screenings   Food Insecurity: No Food Insecurity (06/21/2022)  Housing: Low Risk  (06/21/2022)  Transportation Needs: No Transportation Needs (06/21/2022)  Utilities: Not At Risk (06/21/2022)  Tobacco Use: Medium Risk (02/14/2023)   SDOH Interventions: Transportation Interventions: Intervention Not Indicated, Inpatient TOC, Patient Resources (Friends/Family)   Readmission Risk Interventions    02/15/2023    1:22 PM  Readmission Risk Prevention Plan  Post Dischage Appt Complete  Medication Screening Complete  Transportation Screening Complete

## 2023-02-16 NOTE — Plan of Care (Signed)
  Problem: Clinical Measurements: Goal: Ability to maintain a body temperature in the normal range will improve 02/16/2023 1242 by Irwin Brakeman, RN Outcome: Progressing 02/16/2023 1241 by Irwin Brakeman, RN Outcome: Progressing   Problem: Respiratory: Goal: Ability to maintain adequate ventilation will improve 02/16/2023 1242 by Irwin Brakeman, RN Outcome: Progressing 02/16/2023 1241 by Irwin Brakeman, RN Outcome: Progressing Goal: Ability to maintain a clear airway will improve 02/16/2023 1242 by Irwin Brakeman, RN Outcome: Progressing 02/16/2023 1241 by Irwin Brakeman, RN Outcome: Progressing

## 2023-02-16 NOTE — Plan of Care (Signed)
  Problem: Activity: Goal: Ability to tolerate increased activity will improve 02/16/2023 1601 by Irwin Brakeman, RN Outcome: Progressing 02/16/2023 1241 by Irwin Brakeman, RN Outcome: Progressing   Problem: Respiratory: Goal: Ability to maintain adequate ventilation will improve 02/16/2023 1601 by Irwin Brakeman, RN Outcome: Progressing 02/16/2023 1242 by Irwin Brakeman, RN Outcome: Progressing 02/16/2023 1241 by Irwin Brakeman, RN Outcome: Progressing Goal: Ability to maintain a clear airway will improve 02/16/2023 1601 by Irwin Brakeman, RN Outcome: Progressing 02/16/2023 1242 by Irwin Brakeman, RN Outcome: Progressing 02/16/2023 1241 by Irwin Brakeman, RN Outcome: Progressing

## 2023-02-16 NOTE — Evaluation (Signed)
Physical Therapy Evaluation  Patient Details Name: Adriana Jordan MRN: [id number] DOB: [birth year] Today's Date: 02/16/2023  History of Present Illness  Pt is an 88 y/o female who presents to [facility] on 02/14/23 with dizziness and generalized weakness. She was found to have CAP. PMH includes pituitary adenoma s/p surgical resection and radiation, recent concern for adrenal insufficiency, HTN, CAD with remote MI, right arm skin cancer, ACDF, B RCR.   Clinical Impression  Pt admitted with above diagnosis. Pt currently with functional limitations due to the deficits listed below (see PT Problem List). At the time of PT eval pt was able to perform transfers and ambulation with gross min assist and RW for support. Pt with significant posterior lean initially but able to improve with cues for anterior trunk lean when powering up to full stand. Anticipate pt will be safe to return home with family support at d/c, however recommend continued PT follow up post-acute. Pt will benefit from acute skilled PT to increase their independence and safety with mobility to allow discharge.           Assistance Recommended at Discharge Frequent or constant Supervision/Assistance  If plan is discharge home, recommend the following:  Can travel by private vehicle  A little help with walking and/or transfers;A little help with bathing/dressing/bathroom;Assistance with cooking/housework;Assist for transportation;Help with stairs or ramp for entrance        Equipment Recommendations None recommended by PT  Recommendations for Other Services       Functional Status Assessment Patient has had a recent decline in their functional status and demonstrates the ability to make significant improvements in function in a reasonable and predictable amount of time.     Precautions / Restrictions Precautions Precautions: Fall Restrictions Weight Bearing Restrictions: Yes      Mobility  Bed Mobility Overal bed mobility:  Needs Assistance Bed Mobility: Rolling, Sidelying to Sit Rolling: Min guard Sidelying to sit: Min guard       General bed mobility comments: Required verbal cues for hand placement and bedrail support. Required minguard for safety into transition EOB.    Transfers Overall transfer level: Needs assistance Equipment used: Rolling walker (2 wheels) Transfers: Sit to/from Stand, Bed to chair/wheelchair/BSC Sit to Stand: Min assist   Step pivot transfers: Min assist       General transfer comment: Verbal cues for hand placement and scooting EOB. Min assist provided from PT in order to power up into upright stance and to maintain balance due to posterior lean initially.    Ambulation/Gait Ambulation/Gait assistance: Min guard Gait Distance (Feet): 15 Feet Assistive device: Rolling walker (2 wheels) Gait Pattern/deviations: Step-through pattern, Decreased stride length, Trunk flexed Gait velocity: Decreased Gait velocity interpretation: <1.8 ft/sec, indicate of risk for recurrent falls   General Gait Details: Pt able to ambulate around the bed to the chair with hands on guarding for safety. Pt moving slow but generally steady with RW for support.  Stairs            Wheelchair Mobility     Tilt Bed    Modified Rankin (Stroke Patients Only)       Balance Overall balance assessment: Needs assistance Sitting-balance support: No upper extremity supported, Feet supported Sitting balance-Leahy Scale: Good Sitting balance - Comments: Able to achieve figure 4 with compensatory use of arms to lift leg into figure 4 without LOB Postural control: Posterior lean Standing balance support: Bilateral upper extremity supported, During functional activity Standing balance-Leahy Scale: Poor Standing balance  comment: reliant on device and intermittent external assist                             Pertinent Vitals/Pain Pain Assessment Pain Assessment: No/denies pain     Home Living Family/patient expects to be discharged to:: Private residence Living Arrangements: Children Available Help at Discharge: Family;Available 24 hours/day Type of Home: House Home Access: Stairs to enter Entrance Stairs-Rails: Right Entrance Stairs-Number of Steps: 3   Home Layout: One level Home Equipment: Agricultural consultant (2 wheels);Cane - single point Additional Comments: info taken from chart as patient had increased confusion and hallicinations during session    Prior Function Prior Level of Function : Independent/Modified Independent;Driving             Mobility Comments: Patient reports she was independent prior to admission. Rarely uses RW, only for community ambulation. Able to perform household ambulation without cane or RW. ADLs Comments: Independent     Hand Dominance   Dominant Hand: Right    Extremity/Trunk Assessment   Upper Extremity Assessment Upper Extremity Assessment: Defer to OT evaluation RUE Deficits / Details: Pt reports shoulder arthritis, able to perform FF ~20 degrees actively, but ~90 passively. LUE Deficits / Details: Pt reports shoulder arthritis, Able to perform shoulder flexion ~75 degrees.    Lower Extremity Assessment Lower Extremity Assessment: Generalized weakness    Cervical / Trunk Assessment Cervical / Trunk Assessment: Normal (Forward head posture with rounded shoulders)  Communication   Communication: No difficulties  Cognition Arousal/Alertness: Awake/alert Behavior During Therapy: WFL for tasks assessed/performed Overall Cognitive Status: Impaired/Different from baseline Area of Impairment: Following commands, Safety/judgement, Awareness, Problem solving                       Following Commands: Follows one step commands consistently, Follows one step commands with increased time Safety/Judgement: Decreased awareness of safety, Decreased awareness of deficits Awareness: Emergent Problem Solving: Slow  processing General Comments: Pleasant and relatively conversational throughout session. Requiring up to min cues for safety with RW use and for hand placement during transfers. Pt with slowed speed of processing requiring increased time to follow commands. Unsure of pt's baseline.        General Comments      Exercises     Assessment/Plan    PT Assessment Patient needs continued PT services  PT Problem List Decreased strength;Decreased range of motion;Decreased activity tolerance;Decreased balance;Decreased mobility;Decreased knowledge of use of DME;Decreased safety awareness;Decreased knowledge of precautions;Cardiopulmonary status limiting activity       PT Treatment Interventions DME instruction;Gait training;Functional mobility training;Stair training;Therapeutic activities;Therapeutic exercise;Balance training;Patient/family education    PT Goals (Current goals can be found in the Care Plan section)  Acute Rehab PT Goals Patient Stated Goal: Return home at d/c PT Goal Formulation: With patient Time For Goal Achievement: 02/23/23 Potential to Achieve Goals: Good    Frequency Min 3X/week     Co-evaluation               AM-PAC PT "6 Clicks" Mobility  Outcome Measure Help needed turning from your back to your side while in a flat bed without using bedrails?: A Little Help needed moving from lying on your back to sitting on the side of a flat bed without using bedrails?: A Little Help needed moving to and from a bed to a chair (including a wheelchair)?: A Little Help needed standing up from a chair using your arms (  e.g., wheelchair or bedside chair)?: A Little Help needed to walk in hospital room?: A Little Help needed climbing 3-5 steps with a railing? : A Little 6 Click Score: 18    End of Session Equipment Utilized During Treatment: Gait belt Activity Tolerance: Patient tolerated treatment well Patient left: in chair;with call bell/phone within reach;with chair  alarm set Nurse Communication: Mobility status PT Visit Diagnosis: Unsteadiness on feet (R26.81);Difficulty in walking, not elsewhere classified (R26.2)    Time: 1610-9604 PT Time Calculation (min) (ACUTE ONLY): 27 min   Charges:   PT Evaluation $PT Eval Moderate Complexity: 1 Mod PT Treatments $Gait Training: 8-22 mins PT General Charges $$ ACUTE PT VISIT: 1 Visit         Conni Slipper, PT, DPT Acute Rehabilitation Services Secure Chat Preferred Office: [phone]   Marylynn Pearson 02/16/2023, 4:14 PM

## 2023-02-16 NOTE — Evaluation (Signed)
Occupational Therapy Evaluation Patient Details Name: Adriana Jordan MRN: [id number] DOB: [birth year] Today's Date: 02/16/2023   History of Present Illness Pt is an 88 y/o female who presents to [facility] on 02/14/23 with dizziness and generalized weakness. She was found to have CAP. PMH includes pituitary adenoma s/p surgical resection and radiation, recent concern for adrenal insufficiency, HTN, CAD with remote MI, right arm skin cancer, ACDF, B RCR.   Clinical Impression   PTA, pt lived with her son who she reports assisted with IADL intermittently. No answer from son; called niece, and niece reporting she has not been OOB since admission 2 weeks ago and with poor PO. Pt requiring min A for transfers at this time and min cues for safety. Pt with decreased safety, awareness, problem solving, and balance affecting safe participation in ADL. Pt to continue to benefit from skilled OT services to optimize safety and independence in ADL and IADL. Recommending HHOT to optimize safety and independence in ADL and IADL pending family ability to assist as needed at home or pt progress.       Recommendations for follow up therapy are one component of a multi-disciplinary discharge planning process, led by the attending physician.  Recommendations may be updated based on patient status, additional functional criteria and insurance authorization.   Assistance Recommended at Discharge Frequent or constant Supervision/Assistance  Patient can return home with the following A little help with walking and/or transfers;A little help with bathing/dressing/bathroom;Assistance with cooking/housework;Direct supervision/assist for medications management;Direct supervision/assist for financial management;Assist for transportation;Help with stairs or ramp for entrance    Functional Status Assessment  Patient has had a recent decline in their functional status and demonstrates the ability to make significant improvements in  function in a reasonable and predictable amount of time.  Equipment Recommendations  BSC/3in1    Recommendations for Other Services       Precautions / Restrictions Precautions Precautions: Fall      Mobility Bed Mobility               General bed mobility comments: In recliner on arrival and departure    Transfers Overall transfer level: Needs assistance Equipment used: Rolling walker (2 wheels) Transfers: Sit to/from Stand, Bed to chair/wheelchair/BSC Sit to Stand: Min assist     Step pivot transfers: Min assist     General transfer comment: Verbal cues for hand placement and scooting EOB. Min assist provided from PT in order to power up into upright stance and to maintain balance due to posterior lean initially      Balance Overall balance assessment: Needs assistance Sitting-balance support: No upper extremity supported, Feet supported Sitting balance-Leahy Scale: Good Sitting balance - Comments: Able to achieve figure 4 with compensatory use of arms to lift leg into figure 4 without LOB Postural control: Posterior lean Standing balance support: Bilateral upper extremity supported, During functional activity Standing balance-Leahy Scale: Poor Standing balance comment: reliant on device and intermittent external asssit                           ADL either performed or assessed with clinical judgement   ADL Overall ADL's : Needs assistance/impaired Eating/Feeding: Sitting;Modified independent   Grooming: Min guard;Minimal assistance;Standing   Upper Body Bathing: Set up;Sitting   Lower Body Bathing: Min guard;Minimal assistance;Sit to/from stand   Upper Body Dressing : Set up;Sitting   Lower Body Dressing: Minimal assistance;Sit to/from stand Lower Body Dressing Details (indicate cue type and  reason): Able to don socks in chair with increased time and use of compensatory techniques. Toilet Transfer: Min guard;Minimal  assistance;Ambulation;Rolling walker (2 wheels) Toilet Transfer Details (indicate cue type and reason): Min A ~7 ft progressing to min guard and cues for safety once reaching hall. Toileting- Clothing Manipulation and Hygiene: Min guard;Sitting/lateral lean       Functional mobility during ADLs: Minimal assistance;Rolling walker (2 wheels)       Vision Baseline Vision/History: 1 Wears glasses Ability to See in Adequate Light: 0 Adequate Patient Visual Report: No change from baseline Vision Assessment?: No apparent visual deficits     Perception Perception Perception Tested?: No   Praxis Praxis Praxis tested?: Not tested    Pertinent Vitals/Pain Pain Assessment Pain Assessment: No/denies pain     Hand Dominance Right   Extremity/Trunk Assessment Upper Extremity Assessment Upper Extremity Assessment: Generalized weakness;RUE deficits/detail;LUE deficits/detail RUE Deficits / Details: Pt reports shoulder arthritis, able to perform FF ~20 degrees actively, but ~90 passively. LUE Deficits / Details: Pt reports shoulder arthritis, Able to perform shoulder flexion ~75 degrees.   Lower Extremity Assessment Lower Extremity Assessment: Defer to PT evaluation   Cervical / Trunk Assessment Cervical / Trunk Assessment: Normal   Communication Communication Communication: No difficulties   Cognition Arousal/Alertness: Awake/alert Behavior During Therapy: WFL for tasks assessed/performed Overall Cognitive Status: Impaired/Different from baseline Area of Impairment: Following commands, Safety/judgement, Awareness, Problem solving                       Following Commands: Follows one step commands consistently, Follows one step commands with increased time Safety/Judgement: Decreased awareness of safety, Decreased awareness of deficits Awareness: Emergent Problem Solving: Slow processing General Comments: Pleasant and relatively conversational throughout session.  Requiring up to min cues for safety with RW use and for hand placement during transfers. Pt with slowed speed of processing requiring increased time to follow commands. Unsure of pt's baseline.     General Comments       Exercises     Shoulder Instructions      Home Living Family/patient expects to be discharged to:: Private residence Living Arrangements: Children Available Help at Discharge: Family;Available 24 hours/day Type of Home: House Home Access: Stairs to enter [employer] of Steps: 3 Entrance Stairs-Rails: Right Home Layout: One level     Bathroom Shower/Tub: Walk-in shower (Pt reports "new walk in shower")   Bathroom Toilet: Standard Bathroom Accessibility: Yes   Home Equipment: Agricultural consultant (2 wheels);Cane - single point          Prior Functioning/Environment Prior Level of Function : Independent/Modified Independent;Driving             Mobility Comments: Patient reports she was independent prior to admission. Rarely uses RW, only for community ambulation. Able to perform household ambulation without cane or RW. ADLs Comments: Independent        OT Problem List: Decreased strength;Impaired balance (sitting and/or standing);Decreased activity tolerance;Decreased safety awareness;Decreased knowledge of use of DME or AE;Decreased cognition;Impaired vision/perception;Impaired UE functional use      OT Treatment/Interventions: Self-care/ADL training;Therapeutic exercise;DME and/or AE instruction;Balance training;Patient/family education;Therapeutic activities;Cognitive remediation/compensation;Visual/perceptual remediation/compensation    OT Goals(Current goals can be found in the care plan section) Acute Rehab OT Goals Patient Stated Goal: go home OT Goal Formulation: With patient Time For Goal Achievement: 03/02/23 Potential to Achieve Goals: Good  OT Frequency: Min 2X/week    Co-evaluation  AM-PAC OT "6 Clicks" Daily  Activity     Outcome Measure Help from another person eating meals?: None Help from another person taking care of personal grooming?: A Little Help from another person toileting, which includes using toliet, bedpan, or urinal?: A Little Help from another person bathing (including washing, rinsing, drying)?: A Little Help from another person to put on and taking off regular upper body clothing?: A Little Help from another person to put on and taking off regular lower body clothing?: A Little 6 Click Score: 19   End of Session Equipment Utilized During Treatment: Gait belt;Rolling walker (2 wheels) Nurse Communication: Mobility status  Activity Tolerance: Patient tolerated treatment well Patient left: in chair;with call bell/phone within reach;with chair alarm set  OT Visit Diagnosis: Unsteadiness on feet (R26.81);Muscle weakness (generalized) (M62.81);Other abnormalities of gait and mobility (R26.89);Other symptoms and signs involving cognitive function                Time: 1419-1446 OT Time Calculation (min): 27 min Charges:  OT General Charges $OT Visit: 1 Visit OT Evaluation $OT Eval Moderate Complexity: 1 Mod OT Treatments $Self Care/Home Management : 8-22 mins  Tyler Deis, OTR/L [facility] Acute Rehabilitation Office: [phone]   Myrla Halsted 02/16/2023, 3:37 PM

## 2023-02-16 NOTE — Progress Notes (Signed)
Ref: Orpah Cobb, MD   Subjective:  VS stable. BP improving. Weakness persist.  Objective:  Vital Signs in the last 24 hours: Temp:  [98 F (36.7 C)-98.6 F (37 C)] 98 F (36.7 C) (07/10 1634) Pulse Rate:  [77-79] 79 (07/10 0506) Cardiac Rhythm: Normal sinus rhythm (07/10 0700) Resp:  [16-18] 18 (07/10 1634) BP: (100-134)/(52-76) 134/76 (07/10 1634) SpO2:  [90 %-100 %] 95 % (07/10 1634)  Physical Exam: BP Readings from Last 1 Encounters:  02/16/23 134/76     Wt Readings from Last 1 Encounters:  02/14/23 65.4 kg    Weight change:  Body mass index is 23.27 kg/m. HEENT: [place]/AT, Eyes-Brown, Conjunctiva-Pink, Sclera-Non-icteric Neck: No JVD, No bruit, Trachea midline. Lungs:  Clearing, Bilateral. Cardiac:  Regular rhythm, normal S1 and S2, no S3. II/VI systolic murmur. Abdomen:  Soft, non-tender. BS present. Extremities:  No edema present. No cyanosis. No clubbing. CNS: AxOx3, Cranial nerves grossly intact, moves all 4 extremities.  Skin: Warm and dry.   Intake/Output from previous day: 07/09 0701 - 07/10 0700 In: 2153.9 [P.O.:480; I.V.:1323.9; IV Piggyback:350] Out: 600 [Urine:600]    Lab Results: BMET    Component Value Date/Time   NA 131 (L) 02/15/2023 0321   NA 127 (L) 02/14/2023 1331   NA 139 01/31/2023 0437   K 4.2 02/15/2023 0321   K 2.9 (L) 02/14/2023 1331   K 3.1 (L) 01/31/2023 0437   CL 99 02/15/2023 0321   CL 95 (L) 02/14/2023 1331   CL 105 01/31/2023 0437   CO2 14 (L) 02/15/2023 0321   CO2 17 (L) 02/14/2023 1331   CO2 20 (L) 01/31/2023 0437   GLUCOSE 164 (H) 02/15/2023 0321   GLUCOSE 141 (H) 02/14/2023 1331   GLUCOSE 95 01/31/2023 0437   BUN 6 (L) 02/15/2023 0321   BUN 7 (L) 02/14/2023 1331   BUN 10 01/31/2023 0437   CREATININE 1.09 (H) 02/15/2023 0321   CREATININE 1.06 (H) 02/14/2023 1617   CREATININE 1.14 (H) 02/14/2023 1331   CALCIUM 9.3 02/15/2023 0321   CALCIUM 9.5 02/14/2023 1331   CALCIUM 8.8 (L) 01/31/2023 0437   GFRNONAA 49 (L)  02/15/2023 0321   GFRNONAA 51 (L) 02/14/2023 1617   GFRNONAA 47 (L) 02/14/2023 1331   GFRAA 46 (L) 11/15/2017 0439   GFRAA 40 (L) 11/14/2017 1250   GFRAA 56 (L) 11/09/2017 1341   CBC    Component Value Date/Time   WBC 3.2 (L) 02/15/2023 0321   RBC 5.06 02/15/2023 0321   HGB 15.6 (H) 02/15/2023 0321   HCT 46.4 (H) 02/15/2023 0321   PLT 387 02/15/2023 0321   MCV 91.7 02/15/2023 0321   MCH 30.8 02/15/2023 0321   MCHC 33.6 02/15/2023 0321   RDW 13.1 02/15/2023 0321   LYMPHSABS 1.2 01/30/2023 1112   MONOABS 0.3 01/30/2023 1112   EOSABS 0.1 01/30/2023 1112   BASOSABS 0.0 01/30/2023 1112   HEPATIC Function Panel Recent Labs    06/22/22 0539 06/23/22 0555 01/30/23 1112  PROT 7.3 6.5 6.4*  ALBUMIN 3.6 3.3* 3.4*  AST 42* 28 35  ALT 20 21 15   ALKPHOS 75 65 56   HEMOGLOBIN A1C Lab Results  Component Value Date   MPG 116.89 06/22/2022   CARDIAC ENZYMES Lab Results  Component Value Date   CKTOTAL 284 (H) 01/30/2023   BNP No results for input(s): "PROBNP" in the last 8760 hours. TSH Recent Labs    02/23/22 0602 06/21/22 1400  TSH 3.875 3.088   CHOLESTEROL No results for  input(s): "CHOL" in the last 8760 hours.  Scheduled Meds:  feeding supplement  237 mL Oral BID BM   heparin  5,000 Units Subcutaneous Q8H   hydrocortisone  10 mg Oral QPM   hydrocortisone  20 mg Oral BH-q7a   losartan  25 mg Oral Daily   metoprolol tartrate  12.5 mg Oral BID   multivitamin  1 tablet Oral q morning   rosuvastatin  20 mg Oral QPM   Continuous Infusions:  sodium chloride 50 mL/hr at 02/16/23 1604   cefTRIAXone (ROCEPHIN)  IV Stopped (02/16/23 0918)   PRN Meds:.albuterol, HYDROcodone-acetaminophen, meclizine  Assessment/Plan: Community acquired pneumonia Acute recurrent adrenal insufficiency Severe hypokalemia, resolved Moderate hypomagnesemia Mild Hyponatremia, improving Weakness due to above CAD S/P Coronary stents HTN HLD S/P pituitary adenoma  Dehydration,  improving  Plan:  Continue medical treatment. PT/OT consult.   LOS: 2 days   Time spent including chart review, lab review, examination, discussion with patient : 30 min   Orpah Cobb  MD  02/16/2023, 5:23 PM

## 2023-02-17 LAB — BASIC METABOLIC PANEL
Anion gap: 11 (ref 5–15)
BUN: 9 mg/dL (ref 8–23)
CO2: 19 mmol/L — ABNORMAL LOW (ref 22–32)
Calcium: 9.2 mg/dL (ref 8.9–10.3)
Chloride: 107 mmol/L (ref 98–111)
Creatinine, Ser: 1.22 mg/dL — ABNORMAL HIGH (ref 0.44–1.00)
GFR, Estimated: 43 mL/min — ABNORMAL LOW (ref >60)
Glucose, Bld: 176 mg/dL — ABNORMAL HIGH (ref 70–99)
Potassium: 3 mmol/L — ABNORMAL LOW (ref 3.5–5.1)
Sodium: 137 mmol/L (ref 135–145)

## 2023-02-17 LAB — CBC
HCT: 40.4 % (ref 36.0–46.0)
Hemoglobin: 13.7 g/dL (ref 12.0–15.0)
MCH: 31 pg (ref 26.0–34.0)
MCHC: 33.9 g/dL (ref 30.0–36.0)
MCV: 91.4 fL (ref 80.0–100.0)
Platelets: 377 10*3/uL (ref 150–400)
RBC: 4.42 MIL/uL (ref 3.87–5.11)
RDW: 13.5 % (ref 11.5–15.5)
WBC: 4.4 10*3/uL (ref 4.0–10.5)
nRBC: 0 % (ref 0.0–0.2)

## 2023-02-17 LAB — LACTIC ACID, PLASMA: Lactic Acid, Venous: 2.7 mmol/L (ref 0.5–1.9)

## 2023-02-17 MED ORDER — POTASSIUM CHLORIDE 10 MEQ/100ML IV SOLN
10.0000 meq | INTRAVENOUS | Status: AC
  Administered 2023-02-17 (×2): 10 meq via INTRAVENOUS
  Filled 2023-02-17 (×2): qty 100

## 2023-02-17 MED ORDER — POTASSIUM CHLORIDE CRYS ER 10 MEQ PO TBCR
10.0000 meq | EXTENDED_RELEASE_TABLET | Freq: Two times a day (BID) | ORAL | Status: DC
  Administered 2023-02-17 – 2023-02-18 (×2): 10 meq via ORAL
  Filled 2023-02-17 (×4): qty 1

## 2023-02-17 MED ORDER — MAGNESIUM SULFATE 2 GM/50ML IV SOLN
2.0000 g | Freq: Once | INTRAVENOUS | Status: AC
  Administered 2023-02-17: 2 g via INTRAVENOUS
  Filled 2023-02-17: qty 50

## 2023-02-17 NOTE — TOC Progression Note (Signed)
Transition of Care [facility]) - Progression Note    Patient Details  Name: Adriana Jordan MRN: [id number] Date of Birth: May 25, 1935  [facility] CM/SW Contact  Tom-Johnson, Hershal Coria, RN Phone Number: 02/17/2023, 3:41 PM  Clinical Narrative:     CM spoke with patient and son Adriana Jordan about home health recommendations, both has no preference. CM called inn referral to [facility] ad Brandi voiced acceptance, info on AVS.  BSC ordered from [employer] and [employer] to deliver to patient at bedside.   CM will continue to follow as patient progresses with care towards discharge.    Barriers to Discharge: Continued Medical Work up  Expected Discharge Plan and Services   Discharge Planning Services: CM Consult   Living arrangements for the past 2 months: Single Family Home                                       Social Determinants of Health (SDOH) Interventions SDOH Screenings   Food Insecurity: No Food Insecurity (06/21/2022)  Housing: Low Risk  (06/21/2022)  Transportation Needs: No Transportation Needs (06/21/2022)  Utilities: Not At Risk (06/21/2022)  Tobacco Use: Medium Risk (02/14/2023)    Readmission Risk Interventions    02/15/2023    1:22 PM  Readmission Risk Prevention Plan  Post Dischage Appt Complete  Medication Screening Complete  Transportation Screening Complete

## 2023-02-17 NOTE — Progress Notes (Signed)
Mobility Specialist Progress Note   02/17/23 1159  Mobility  Activity Ambulated with assistance in hallway;Ambulated with assistance in room;Transferred from bed to chair  Level of Assistance Minimal assist, patient does 75% or more  Assistive Device Front wheel walker  Distance Ambulated (ft) 40 ft  Activity Response Tolerated well  Mobility Referral Yes  $Mobility charge 1 Mobility  Mobility Specialist Start Time (ACUTE ONLY) 1140  Mobility Specialist Stop Time (ACUTE ONLY) 1159  Mobility Specialist Time Calculation (min) (ACUTE ONLY) 19 min   Received pt in bed having c/o arthritis pain in R shoulder but agreeable to mobility. MinA to EOB d/t pain in R shoulder but CGA to stand. Pt ambulating in hallway w/ a slow and steady gait and requiring min cues on RW mechanics. Able to return back to chair w/o fault, call bell placed in reach and chair alarm on.   Frederico Hamman Mobility Specialist Please contact via SecureChat or  Rehab office at [phone]

## 2023-02-17 NOTE — Progress Notes (Signed)
Ref: Orpah Cobb, MD   Subjective:  Awake. VS stable. Hypokalemia and Lactic acidosis/Metabolic acidosis continues.  Objective:  Vital Signs in the last 24 hours: Temp:  [97.8 F (36.6 C)-98.2 F (36.8 C)] 98.2 F (36.8 C) (07/11 1733) Pulse Rate:  [70-87] 72 (07/11 1733) Cardiac Rhythm: Normal sinus rhythm (07/10 1900) Resp:  [16-18] 18 (07/11 1733) BP: (112-144)/(61-76) 116/74 (07/11 1733) SpO2:  [92 %-95 %] 95 % (07/11 1733)  Physical Exam: BP Readings from Last 1 Encounters:  02/17/23 116/74     Wt Readings from Last 1 Encounters:  02/14/23 65.4 kg    Weight change:  Body mass index is 23.27 kg/m. HEENT: [place]/AT, Eyes-Brown, Conjunctiva-Pink, Sclera-Non-icteric Neck: No JVD, No bruit, Trachea midline. Lungs:  Clear, Bilateral. Cardiac:  Regular rhythm, normal S1 and S2, no S3. II/VI systolic murmur. Abdomen:  Soft, non-tender. BS present. Extremities:  No edema present. No cyanosis. No clubbing. CNS: AxOx3, Cranial nerves grossly intact, moves all 4 extremities.  Skin: Warm and dry.   Intake/Output from previous day: 07/10 0701 - 07/11 0700 In: 1499.7 [P.O.:600; I.V.:799.7; IV Piggyback:100] Out: 1350 [Urine:1350]    Lab Results: BMET    Component Value Date/Time   NA 137 02/17/2023 1428   NA 131 (L) 02/15/2023 0321   NA 127 (L) 02/14/2023 1331   K 3.0 (L) 02/17/2023 1428   K 4.2 02/15/2023 0321   K 2.9 (L) 02/14/2023 1331   CL 107 02/17/2023 1428   CL 99 02/15/2023 0321   CL 95 (L) 02/14/2023 1331   CO2 19 (L) 02/17/2023 1428   CO2 14 (L) 02/15/2023 0321   CO2 17 (L) 02/14/2023 1331   GLUCOSE 176 (H) 02/17/2023 1428   GLUCOSE 164 (H) 02/15/2023 0321   GLUCOSE 141 (H) 02/14/2023 1331   BUN 9 02/17/2023 1428   BUN 6 (L) 02/15/2023 0321   BUN 7 (L) 02/14/2023 1331   CREATININE 1.22 (H) 02/17/2023 1428   CREATININE 1.09 (H) 02/15/2023 0321   CREATININE 1.06 (H) 02/14/2023 1617   CALCIUM 9.2 02/17/2023 1428   CALCIUM 9.3 02/15/2023 0321   CALCIUM  9.5 02/14/2023 1331   GFRNONAA 43 (L) 02/17/2023 1428   GFRNONAA 49 (L) 02/15/2023 0321   GFRNONAA 51 (L) 02/14/2023 1617   GFRAA 46 (L) 11/15/2017 0439   GFRAA 40 (L) 11/14/2017 1250   GFRAA 56 (L) 11/09/2017 1341   CBC    Component Value Date/Time   WBC 4.4 02/17/2023 1428   RBC 4.42 02/17/2023 1428   HGB 13.7 02/17/2023 1428   HCT 40.4 02/17/2023 1428   PLT 377 02/17/2023 1428   MCV 91.4 02/17/2023 1428   MCH 31.0 02/17/2023 1428   MCHC 33.9 02/17/2023 1428   RDW 13.5 02/17/2023 1428   LYMPHSABS 1.2 01/30/2023 1112   MONOABS 0.3 01/30/2023 1112   EOSABS 0.1 01/30/2023 1112   BASOSABS 0.0 01/30/2023 1112   HEPATIC Function Panel Recent Labs    06/22/22 0539 06/23/22 0555 01/30/23 1112  PROT 7.3 6.5 6.4*  ALBUMIN 3.6 3.3* 3.4*  AST 42* 28 35  ALT 20 21 15   ALKPHOS 75 65 56   HEMOGLOBIN A1C Lab Results  Component Value Date   MPG 116.89 06/22/2022   CARDIAC ENZYMES Lab Results  Component Value Date   CKTOTAL 284 (H) 01/30/2023   BNP No results for input(s): "PROBNP" in the last 8760 hours. TSH Recent Labs    02/23/22 0602 06/21/22 1400  TSH 3.875 3.088   CHOLESTEROL No results for  input(s): "CHOL" in the last 8760 hours.  Scheduled Meds:  feeding supplement  237 mL Oral BID BM   heparin  5,000 Units Subcutaneous Q8H   hydrocortisone  10 mg Oral QPM   hydrocortisone  20 mg Oral BH-q7a   losartan  25 mg Oral Daily   metoprolol tartrate  12.5 mg Oral BID   multivitamin  1 tablet Oral q morning   potassium chloride  10 mEq Oral BID   rosuvastatin  20 mg Oral QPM   Continuous Infusions:  sodium chloride 50 mL/hr at 02/16/23 2229   cefTRIAXone (ROCEPHIN)  IV 1 g (02/17/23 1047)   magnesium sulfate bolus IVPB     potassium chloride     PRN Meds:.albuterol, HYDROcodone-acetaminophen, meclizine  Assessment/Plan: Community acquired pneumonia Acute recurrent adrenal insufficiency Severe hypokalemia, resolved Moderate hypomagnesemia Mild  Hyponatremia, improving Hypokalemia Metabolic acidosis Weakness due to above CAD S/P Coronary stents HTN HLD S/P pituitary adenoma  Dehydration, improving  Plan: Increase IV fluids. Potassium and magnesium supplement.    LOS: 3 days   Time spent including chart review, lab review, examination, discussion with patient : 30 min   Orpah Cobb  MD  02/17/2023, 5:58 PM

## 2023-02-17 NOTE — Care Management Important Message (Signed)
Important Message  Patient Details  Name: Adriana Jordan MRN: [id number] Date of Birth: 04/05/1935   Medicare Important Message Given:  Yes     Renie Ora 02/17/2023, 9:06 AM

## 2023-02-18 LAB — BASIC METABOLIC PANEL
Anion gap: 7 (ref 5–15)
Anion gap: 7 (ref 5–15)
BUN: 7 mg/dL — ABNORMAL LOW (ref 8–23)
BUN: 8 mg/dL (ref 8–23)
CO2: 22 mmol/L (ref 22–32)
CO2: 22 mmol/L (ref 22–32)
Calcium: 8.7 mg/dL — ABNORMAL LOW (ref 8.9–10.3)
Calcium: 8.8 mg/dL — ABNORMAL LOW (ref 8.9–10.3)
Chloride: 109 mmol/L (ref 98–111)
Chloride: 108 mmol/L (ref 98–111)
Creatinine, Ser: 0.93 mg/dL (ref 0.44–1.00)
Creatinine, Ser: 1.25 mg/dL — ABNORMAL HIGH (ref 0.44–1.00)
GFR, Estimated: 60 mL/min — ABNORMAL LOW (ref >60)
GFR, Estimated: 42 mL/min — ABNORMAL LOW (ref >60)
Glucose, Bld: 113 mg/dL — ABNORMAL HIGH (ref 70–99)
Glucose, Bld: 153 mg/dL — ABNORMAL HIGH (ref 70–99)
Potassium: 3 mmol/L — ABNORMAL LOW (ref 3.5–5.1)
Potassium: 3.5 mmol/L (ref 3.5–5.1)
Sodium: 138 mmol/L (ref 135–145)
Sodium: 137 mmol/L (ref 135–145)

## 2023-02-18 LAB — CULTURE, BLOOD (ROUTINE X 2)

## 2023-02-18 LAB — LACTIC ACID, PLASMA: Lactic Acid, Venous: 1.1 mmol/L (ref 0.5–1.9)

## 2023-02-18 LAB — GLUCOSE, CAPILLARY: Glucose-Capillary: 109 mg/dL — ABNORMAL HIGH (ref 70–99)

## 2023-02-18 LAB — MAGNESIUM: Magnesium: 1.9 mg/dL (ref 1.7–2.4)

## 2023-02-18 MED ORDER — BISACODYL 10 MG RE SUPP: 10.0000 mg | Freq: Every day | RECTAL | Status: DC | PRN

## 2023-02-18 MED ORDER — POTASSIUM CHLORIDE 10 MEQ/100ML IV SOLN
10.0000 meq | INTRAVENOUS | Status: AC
  Administered 2023-02-18 (×4): 10 meq via INTRAVENOUS
  Filled 2023-02-18 (×4): qty 100

## 2023-02-18 MED ORDER — INSULIN ASPART 100 UNIT/ML IJ SOLN
0.0000 [IU] | Freq: Three times a day (TID) | INTRAMUSCULAR | Status: DC
  Administered 2023-02-20: 2 [IU] via SUBCUTANEOUS

## 2023-02-18 MED ORDER — POTASSIUM CHLORIDE CRYS ER 10 MEQ PO TBCR
10.0000 meq | EXTENDED_RELEASE_TABLET | Freq: Three times a day (TID) | ORAL | Status: DC
  Administered 2023-02-18: 10 meq via ORAL
  Filled 2023-02-18: qty 1

## 2023-02-18 MED ORDER — ASPIRIN 81 MG PO TBEC
81.0000 mg | DELAYED_RELEASE_TABLET | Freq: Every day | ORAL | Status: DC
  Administered 2023-02-18 – 2023-02-20 (×3): 81 mg via ORAL
  Filled 2023-02-18 (×3): qty 1

## 2023-02-18 MED ORDER — INSULIN ASPART 100 UNIT/ML IJ SOLN
4.0000 [IU] | Freq: Three times a day (TID) | INTRAMUSCULAR | Status: DC
  Administered 2023-02-19 – 2023-02-20 (×2): 4 [IU] via SUBCUTANEOUS

## 2023-02-18 NOTE — Progress Notes (Signed)
Ref: Adriana Cobb, MD   Subjective:  Awake. VS stable. Had good BM after c/o constipation earlier in the day. Potassium level has improved. Magnesium level is normal. Both post supplementation. Blood sugars are inching up with cortef use. Lactic acid level is normalized.  Objective:  Vital Signs in the last 24 hours: Temp:  [97.8 F (36.6 C)-98 F (36.7 C)] 97.8 F (36.6 C) (07/12 1647) Pulse Rate:  [71-81] 72 (07/12 1647) Cardiac Rhythm: Normal sinus rhythm (07/12 1800) Resp:  [16-18] 16 (07/12 1647) BP: (115-164)/(55-81) 124/76 (07/12 1647) SpO2:  [93 %-96 %] 96 % (07/12 1647)  Physical Exam: BP Readings from Last 1 Encounters:  02/18/23 124/76     Wt Readings from Last 1 Encounters:  02/14/23 65.4 kg    Weight change:  Body mass index is 23.27 kg/m. HEENT: /AT, Eyes-Brown, Conjunctiva-Pink, Sclera-Non-icteric. Wears glasses. Neck: No JVD, No bruit, Trachea midline. Lungs:  Clearing, Bilateral. Cardiac:  Regular rhythm, normal S1 and S2, no S3. III/VI systolic murmur. Abdomen:  Soft, non-tender. BS present. Extremities:  No edema present. No cyanosis. No clubbing. CNS: AxOx3, Cranial nerves grossly intact, moves all 4 extremities.  Skin: Warm and dry.   Intake/Output from previous day: 07/11 0701 - 07/12 0700 In: 720 [P.O.:720] Out: 2025 [Urine:2025]    Lab Results: BMET    Component Value Date/Time   NA 137 02/18/2023 1654   NA 138 02/18/2023 0433   NA 137 02/17/2023 1428   K 3.5 02/18/2023 1654   K 3.0 (L) 02/18/2023 0433   K 3.0 (L) 02/17/2023 1428   CL 108 02/18/2023 1654   CL 109 02/18/2023 0433   CL 107 02/17/2023 1428   CO2 22 02/18/2023 1654   CO2 22 02/18/2023 0433   CO2 19 (L) 02/17/2023 1428   GLUCOSE 153 (H) 02/18/2023 1654   GLUCOSE 113 (H) 02/18/2023 0433   GLUCOSE 176 (H) 02/17/2023 1428   BUN 8 02/18/2023 1654   BUN 7 (L) 02/18/2023 0433   BUN 9 02/17/2023 1428   CREATININE 1.25 (H) 02/18/2023 1654   CREATININE 0.93 02/18/2023  0433   CREATININE 1.22 (H) 02/17/2023 1428   CALCIUM 8.8 (L) 02/18/2023 1654   CALCIUM 8.7 (L) 02/18/2023 0433   CALCIUM 9.2 02/17/2023 1428   GFRNONAA 42 (L) 02/18/2023 1654   GFRNONAA 60 (L) 02/18/2023 0433   GFRNONAA 43 (L) 02/17/2023 1428   GFRAA 46 (L) 11/15/2017 0439   GFRAA 40 (L) 11/14/2017 1250   GFRAA 56 (L) 11/09/2017 1341   CBC    Component Value Date/Time   WBC 4.4 02/17/2023 1428   RBC 4.42 02/17/2023 1428   HGB 13.7 02/17/2023 1428   HCT 40.4 02/17/2023 1428   PLT 377 02/17/2023 1428   MCV 91.4 02/17/2023 1428   MCH 31.0 02/17/2023 1428   MCHC 33.9 02/17/2023 1428   RDW 13.5 02/17/2023 1428   LYMPHSABS 1.2 01/30/2023 1112   MONOABS 0.3 01/30/2023 1112   EOSABS 0.1 01/30/2023 1112   BASOSABS 0.0 01/30/2023 1112   HEPATIC Function Panel Recent Labs    06/22/22 0539 06/23/22 0555 01/30/23 1112  PROT 7.3 6.5 6.4*  ALBUMIN 3.6 3.3* 3.4*  AST 42* 28 35  ALT 20 21 15   ALKPHOS 75 65 56   HEMOGLOBIN A1C Lab Results  Component Value Date   MPG 116.89 06/22/2022   CARDIAC ENZYMES Lab Results  Component Value Date   CKTOTAL 284 (H) 01/30/2023   BNP No results for input(s): "PROBNP" in the last  8760 hours. TSH Recent Labs    02/23/22 0602 06/21/22 1400  TSH 3.875 3.088   CHOLESTEROL No results for input(s): "CHOL" in the last 8760 hours.  Scheduled Meds:  aspirin EC  81 mg Oral Daily   feeding supplement  237 mL Oral BID BM   heparin  5,000 Units Subcutaneous Q8H   hydrocortisone  10 mg Oral QPM   hydrocortisone  20 mg Oral BH-q7a   losartan  25 mg Oral Daily   metoprolol tartrate  12.5 mg Oral BID   multivitamin  1 tablet Oral q morning   potassium chloride  10 mEq Oral TID   rosuvastatin  20 mg Oral QPM   Continuous Infusions:  sodium chloride 25 mL/hr at 02/18/23 1321   cefTRIAXone (ROCEPHIN)  IV 1 g (02/18/23 1122)   PRN Meds:.albuterol, bisacodyl, HYDROcodone-acetaminophen, meclizine  Assessment/Plan: Community acquired  pneumonia Acute recurrent adrenal insufficiency Severe hypokalemia, resolved Moderate hypomagnesemia, resolved Mild Hyponatremia, resolved Metabolic acidosis, improved Weakness due to above CAD S/P Coronary stents HTN HLD S/P pituitary adenoma  Dehydration, improving Constipation  Plan: Increase activity. Continue medical treatment. Continue hydrocortisone. Check T4 and TSH, HgbA1C.   LOS: 4 days   Time spent including chart review, lab review, examination, discussion with patient/Nurse : 35 min   Adriana Cobb  MD  02/18/2023, 6:49 PM

## 2023-02-18 NOTE — Progress Notes (Signed)
Physical Therapy Treatment Patient Details Name: Adriana Jordan MRN: [id number] DOB: 07-03-1935 Today's Date: 02/18/2023   History of Present Illness Pt is an 88 y/o female who presents to [facility] on 02/14/23 with dizziness and generalized weakness. She was found to have CAP. PMH includes pituitary adenoma s/p surgical resection and radiation, recent concern for adrenal insufficiency, HTN, CAD with remote MI, right arm skin cancer, ACDF, B RCR.    PT Comments  Pt received in supine, c/o fatigue after recently getting up to bathroom with staff assist, also c/o LUE IV pain, RN called to room to address. Pt given HEP handout to reinforce supine/seated BLE exercises for strengthening and reviewed pressure relief frequency/technique. Pt agreeable to work on OOB mobility next session. Pt continues to benefit from PT services to progress toward functional mobility goals.      Assistance Recommended at Discharge Frequent or constant Supervision/Assistance  If plan is discharge home, recommend the following:  Can travel by private vehicle    A little help with walking and/or transfers;A little help with bathing/dressing/bathroom;Assistance with cooking/housework;Assist for transportation;Help with stairs or ramp for entrance      Equipment Recommendations  None recommended by PT    Recommendations for Other Services       Precautions / Restrictions Precautions Precautions: Fall Restrictions Weight Bearing Restrictions: Yes     Mobility  Bed Mobility               General bed mobility comments: pt defers due to IV burning    Transfers                   General transfer comment: pt defers    Ambulation/Gait                   Stairs             Wheelchair Mobility     Tilt Bed    Modified Rankin (Stroke Patients Only)       Balance Overall balance assessment: Needs assistance     Sitting balance - Comments: pt defers                                     Cognition Arousal/Alertness: Awake/alert Behavior During Therapy: WFL for tasks assessed/performed, Anxious Overall Cognitive Status: Impaired/Different from baseline Area of Impairment: Following commands, Safety/judgement, Awareness, Problem solving                       Following Commands: Follows one step commands consistently, Follows one step commands with increased time Safety/Judgement: Decreased awareness of safety, Decreased awareness of deficits Awareness: Emergent Problem Solving: Slow processing General Comments: Pt c/o fatigue after recent return to bed and perseverating on burning IV, RN notified.        Exercises Other Exercises Other Exercises: reviewed supine BLE exercises hip/knee/ankle ROM and seated exercises and handout given to reinforce.    General Comments        Pertinent Vitals/Pain Pain Assessment Pain Assessment: Faces Faces Pain Scale: Hurts even more Pain Location: LUE (around IV) Pain Descriptors / Indicators: Burning Pain Intervention(s): Limited activity within patient's tolerance, Monitored during session, Other (comment) (RN notified)    Home Living                          Prior Function  PT Goals (current goals can now be found in the care plan section) Acute Rehab PT Goals Patient Stated Goal: Return home at d/c PT Goal Formulation: With patient Time For Goal Achievement: 02/23/23 Progress towards PT goals: Progressing toward goals (working with MS today, recently returned to bed will reassess goal progress next session)    Frequency    Min 3X/week      PT Plan Current plan remains appropriate    Co-evaluation              AM-PAC PT "6 Clicks" Mobility   Outcome Measure  Help needed turning from your back to your side while in a flat bed without using bedrails?: A Little Help needed moving from lying on your back to sitting on the side of a flat bed without  using bedrails?: A Little Help needed moving to and from a bed to a chair (including a wheelchair)?: A Little Help needed standing up from a chair using your arms (e.g., wheelchair or bedside chair)?: A Little Help needed to walk in hospital room?: A Little Help needed climbing 3-5 steps with a railing? : A Little 6 Click Score: 18    End of Session   Activity Tolerance: Patient limited by fatigue;Other (comment);Patient limited by pain (c/o LUE IV pain) Patient left: with call bell/phone within reach;in bed;with bed alarm set;Other (comment) (pt heels floated) Nurse Communication: Mobility status;Other (comment) (LUE IV pain) PT Visit Diagnosis: Unsteadiness on feet (R26.81);Difficulty in walking, not elsewhere classified (R26.2)     Time: 1610-9604 PT Time Calculation (min) (ACUTE ONLY): 8 min  Charges:    $Therapeutic Activity: 8-22 mins PT General Charges $$ ACUTE PT VISIT: 1 Visit                     Venice Liz P., PTA Acute Rehabilitation Services Secure Chat Preferred 9a-5:30pm Office: [phone]    Dorathy Kinsman [facility] 02/18/2023, 5:52 PM

## 2023-02-19 LAB — TSH: TSH: 3.198 u[IU]/mL (ref 0.350–4.500)

## 2023-02-19 LAB — COMPREHENSIVE METABOLIC PANEL
ALT: 13 U/L (ref 0–44)
AST: 23 U/L (ref 15–41)
Albumin: 2.8 g/dL — ABNORMAL LOW (ref 3.5–5.0)
Alkaline Phosphatase: 43 U/L (ref 38–126)
Anion gap: 8 (ref 5–15)
BUN: 7 mg/dL — ABNORMAL LOW (ref 8–23)
CO2: 22 mmol/L (ref 22–32)
Calcium: 8.5 mg/dL — ABNORMAL LOW (ref 8.9–10.3)
Chloride: 108 mmol/L (ref 98–111)
Creatinine, Ser: 1.12 mg/dL — ABNORMAL HIGH (ref 0.44–1.00)
GFR, Estimated: 48 mL/min — ABNORMAL LOW (ref >60)
Glucose, Bld: 104 mg/dL — ABNORMAL HIGH (ref 70–99)
Potassium: 3.2 mmol/L — ABNORMAL LOW (ref 3.5–5.1)
Sodium: 138 mmol/L (ref 135–145)
Total Bilirubin: 0.2 mg/dL — ABNORMAL LOW (ref 0.3–1.2)
Total Protein: 5.7 g/dL — ABNORMAL LOW (ref 6.5–8.1)

## 2023-02-19 LAB — CULTURE, BLOOD (ROUTINE X 2)
Culture: NO GROWTH
Culture: NO GROWTH
Special Requests: ADEQUATE

## 2023-02-19 LAB — CBC
HCT: 37.7 % (ref 36.0–46.0)
Hemoglobin: 12.9 g/dL (ref 12.0–15.0)
MCH: 30.9 pg (ref 26.0–34.0)
MCHC: 34.2 g/dL (ref 30.0–36.0)
MCV: 90.2 fL (ref 80.0–100.0)
Platelets: 298 10*3/uL (ref 150–400)
RBC: 4.18 MIL/uL (ref 3.87–5.11)
RDW: 13.6 % (ref 11.5–15.5)
WBC: 4.1 10*3/uL (ref 4.0–10.5)
nRBC: 0 % (ref 0.0–0.2)

## 2023-02-19 LAB — GLUCOSE, CAPILLARY
Glucose-Capillary: 84 mg/dL (ref 70–99)
Glucose-Capillary: 101 mg/dL — ABNORMAL HIGH (ref 70–99)
Glucose-Capillary: 98 mg/dL (ref 70–99)
Glucose-Capillary: 131 mg/dL — ABNORMAL HIGH (ref 70–99)

## 2023-02-19 LAB — HEMOGLOBIN A1C
Hgb A1c MFr Bld: 6.2 % — ABNORMAL HIGH (ref 4.8–5.6)
Mean Plasma Glucose: 131.24 mg/dL

## 2023-02-19 LAB — T4, FREE: Free T4: 0.39 ng/dL — ABNORMAL LOW (ref 0.61–1.12)

## 2023-02-19 MED ORDER — POTASSIUM CHLORIDE CRYS ER 20 MEQ PO TBCR
20.0000 meq | EXTENDED_RELEASE_TABLET | Freq: Two times a day (BID) | ORAL | Status: DC
  Administered 2023-02-19 – 2023-02-20 (×3): 20 meq via ORAL
  Filled 2023-02-19 (×3): qty 1

## 2023-02-19 MED ORDER — SPIRONOLACTONE 12.5 MG HALF TABLET
12.5000 mg | ORAL_TABLET | Freq: Every day | ORAL | Status: DC
  Administered 2023-02-19 – 2023-02-20 (×2): 12.5 mg via ORAL
  Filled 2023-02-19 (×2): qty 1

## 2023-02-19 NOTE — Plan of Care (Signed)
  Problem: Activity: Goal: Ability to tolerate increased activity will improve Outcome: Progressing   Problem: Clinical Measurements: Goal: Ability to maintain a body temperature in the normal range will improve Outcome: Progressing   Problem: Respiratory: Goal: Ability to maintain adequate ventilation will improve Outcome: Progressing Goal: Ability to maintain a clear airway will improve Outcome: Progressing   Problem: Education: Goal: Knowledge of General Education information will improve Description: Including pain rating scale, medication(s)/side effects and non-pharmacologic comfort measures Outcome: Progressing   Problem: Health Behavior/Discharge Planning: Goal: Ability to manage health-related needs will improve Outcome: Progressing   Problem: Clinical Measurements: Goal: Ability to maintain clinical measurements within normal limits will improve Outcome: Progressing Goal: Will remain free from infection Outcome: Progressing Goal: Diagnostic test results will improve Outcome: Progressing Goal: Respiratory complications will improve Outcome: Progressing Goal: Cardiovascular complication will be avoided Outcome: Progressing   Problem: Activity: Goal: Risk for activity intolerance will decrease Outcome: Progressing   Problem: Nutrition: Goal: Adequate nutrition will be maintained Outcome: Progressing   Problem: Coping: Goal: Level of anxiety will decrease Outcome: Progressing   Problem: Elimination: Goal: Will not experience complications related to bowel motility Outcome: Progressing Goal: Will not experience complications related to urinary retention Outcome: Progressing   Problem: Pain Managment: Goal: General experience of comfort will improve Outcome: Progressing   Problem: Safety: Goal: Ability to remain free from injury will improve Outcome: Progressing   Problem: Skin Integrity: Goal: Risk for impaired skin integrity will decrease Outcome:  Progressing   Problem: Education: Goal: Ability to describe self-care measures that may prevent or decrease complications (Diabetes Survival Skills Education) will improve Outcome: Progressing Goal: Individualized Educational Video(s) Outcome: Progressing   Problem: Coping: Goal: Ability to adjust to condition or change in health will improve Outcome: Progressing   Problem: Fluid Volume: Goal: Ability to maintain a balanced intake and output will improve Outcome: Progressing   Problem: Health Behavior/Discharge Planning: Goal: Ability to identify and utilize available resources and services will improve Outcome: Progressing Goal: Ability to manage health-related needs will improve Outcome: Progressing   Problem: Metabolic: Goal: Ability to maintain appropriate glucose levels will improve Outcome: Progressing   Problem: Nutritional: Goal: Maintenance of adequate nutrition will improve Outcome: Progressing Goal: Progress toward achieving an optimal weight will improve Outcome: Progressing   Problem: Skin Integrity: Goal: Risk for impaired skin integrity will decrease Outcome: Progressing   Problem: Tissue Perfusion: Goal: Adequacy of tissue perfusion will improve Outcome: Progressing   

## 2023-02-19 NOTE — Progress Notes (Signed)
Subjective:  Patient denies any chest pain or shortness of breath states feels weak and tired has not ambulated much potassium still remains low despite receiving IV potassium yesterday  Objective:  Vital Signs in the last 24 hours: Temp:  [97.8 F (36.6 C)-98.4 F (36.9 C)] 97.8 F (36.6 C) (07/13 0856) Pulse Rate:  [44-72] 71 (07/13 0856) Resp:  [16-19] 18 (07/13 0856) BP: (116-147)/(64-83) 116/64 (07/13 0856) SpO2:  [94 %-98 %] 98 % (07/13 0856)  Intake/Output from previous day: 07/12 0701 - 07/13 0700 In: 960 [P.O.:960] Out: 1350 [Urine:1350] Intake/Output from this shift: Total I/O In: 240 [P.O.:240] Out: 200 [Urine:200]  Physical Exam: Neck: no adenopathy, no carotid bruit, no JVD, and supple, symmetrical, trachea midline Lungs: Decreased breath sounds at bases Heart: regular rate and rhythm, S1, S2 normal, and 2/6 systolic murmur noted Abdomen: soft, non-tender; bowel sounds normal; no masses,  no organomegaly Extremities: extremities normal, atraumatic, no cyanosis or edema  Lab Results: Recent Labs    02/17/23 1428 02/19/23 0436  WBC 4.4 4.1  HGB 13.7 12.9  PLT 377 298   Recent Labs    02/18/23 1654 02/19/23 0436  NA 137 138  K 3.5 3.2*  CL 108 108  CO2 22 22  GLUCOSE 153* 104*  BUN 8 7*  CREATININE 1.25* 1.12*   No results for input(s): "TROPONINI" in the last 72 hours.  Invalid input(s): "CK", "MB" Hepatic Function Panel Recent Labs    02/19/23 0436  PROT 5.7*  ALBUMIN 2.8*  AST 23  ALT 13  ALKPHOS 43  BILITOT 0.2*   No results for input(s): "CHOL" in the last 72 hours. No results for input(s): "PROTIME" in the last 72 hours.  Imaging: Imaging results have been reviewed and No results found.  Cardiac Studies:  Assessment/Plan:  Status post possible community acquired pneumonia Acute recurrent adrenal insufficiency Recurrent hypokalemia,  Moderate hypomagnesemia, resolved Mild Hyponatremia, resolved Metabolic acidosis,  improved Weakness due to above CAD S/P Coronary stents HTN HLD S/P pituitary adenoma  Plan DC losartan Start spironolactone 12.5 mg daily Increase K-Dur as per orders Increase ambulation as tolerated with assistance Check labs in a.m.   LOS: 5 days    Rinaldo Cloud 02/19/2023, 11:37 AM

## 2023-02-20 LAB — BASIC METABOLIC PANEL
Anion gap: 9 (ref 5–15)
BUN: 10 mg/dL (ref 8–23)
CO2: 23 mmol/L (ref 22–32)
Calcium: 8.9 mg/dL (ref 8.9–10.3)
Chloride: 106 mmol/L (ref 98–111)
Creatinine, Ser: 1.17 mg/dL — ABNORMAL HIGH (ref 0.44–1.00)
GFR, Estimated: 45 mL/min — ABNORMAL LOW (ref >60)
Glucose, Bld: 109 mg/dL — ABNORMAL HIGH (ref 70–99)
Potassium: 3.6 mmol/L (ref 3.5–5.1)
Sodium: 138 mmol/L (ref 135–145)

## 2023-02-20 LAB — MAGNESIUM: Magnesium: 1.5 mg/dL — ABNORMAL LOW (ref 1.7–2.4)

## 2023-02-20 LAB — GLUCOSE, CAPILLARY
Glucose-Capillary: 103 mg/dL — ABNORMAL HIGH (ref 70–99)
Glucose-Capillary: 127 mg/dL — ABNORMAL HIGH (ref 70–99)

## 2023-02-20 MED ORDER — ENSURE ENLIVE PO LIQD: 237.0000 mL | Freq: Two times a day (BID) | ORAL | 12 refills | Status: AC

## 2023-02-20 MED ORDER — POTASSIUM CHLORIDE CRYS ER 20 MEQ PO TBCR: 20.0000 meq | EXTENDED_RELEASE_TABLET | Freq: Every day | ORAL | 3 refills | Status: DC

## 2023-02-20 MED ORDER — MAGNESIUM SULFATE 2 GM/50ML IV SOLN
2.0000 g | Freq: Once | INTRAVENOUS | Status: AC
  Administered 2023-02-20: 2 g via INTRAVENOUS
  Filled 2023-02-20: qty 50

## 2023-02-20 MED ORDER — MAGNESIUM SULFATE 2 GM/50ML IV SOLN: 2.0000 g | Freq: Once | INTRAVENOUS | 0 refills | Status: AC

## 2023-02-20 MED ORDER — SPIRONOLACTONE 25 MG PO TABS: 12.5000 mg | ORAL_TABLET | Freq: Every day | ORAL | 3 refills | Status: DC

## 2023-02-20 MED ORDER — PRESERVISION AREDS 2 PO CAPS: 1.0000 | ORAL_CAPSULE | Freq: Every morning | ORAL | 3 refills | Status: DC

## 2023-02-20 NOTE — Discharge Summary (Signed)
Is discharge summary dictated on 02/20/2023 dictation number is [id number]

## 2023-02-20 NOTE — Progress Notes (Signed)
All personal belongings given to the son, new meds reviewed with p-atient and son, patient IV removed and Patient Discharged.

## 2023-02-20 NOTE — TOC Transition Note (Signed)
Transition of Care [facility]) - CM/SW Discharge Note   Patient Details  Name: Adriana Jordan MRN: [id number] Date of Birth: 1935/01/15  [facility] CM/SW Contact:  Ronny Bacon, RN Phone Number: 02/20/2023, 9:51 AM   Clinical Narrative:   Patient being discharged home today. Confirmed with Laurelyn Sickle- [facility] that patient is under their care for [facility] PT, OT, and SW.    Final next level of care: Home w Home Health Services Barriers to Discharge: No Barriers Identified   Patient Goals and CMS Choice CMS Medicare.gov Compare Post Acute Care list provided to:: Patient    Discharge Placement                         Discharge Plan and Services Additional resources added to the After Visit Summary for     Discharge Planning Services: CM Consult            DME Arranged: Bedside commode DME Agency: [employer] Date DME Agency Contacted: 02/17/23 Time DME Agency Contacted: 1540 Representative spoke with at DME Agency: [employer] HH Arranged: PT, OT HH Agency: [facility] Date [facility] Agency Contacted: 02/17/23 Time HH Agency Contacted: 1146 Representative spoke with at [facility] Agency: Merry Proud  Social Determinants of Health (SDOH) Interventions SDOH Screenings   Food Insecurity: No Food Insecurity (06/21/2022)  Housing: Low Risk  (06/21/2022)  Transportation Needs: No Transportation Needs (06/21/2022)  Utilities: Not At Risk (06/21/2022)  Tobacco Use: Medium Risk (02/14/2023)     Readmission Risk Interventions    02/15/2023    1:22 PM  Readmission Risk Prevention Plan  Post Dischage Appt Complete  Medication Screening Complete  Transportation Screening Complete

## 2023-02-21 NOTE — Discharge Summary (Signed)
NAME: Adriana Jordan, Adriana Jordan MEDICAL RECORD NO: [id number] ACCOUNT NO: [id number] DATE OF BIRTH: 03/01/1935 FACILITY: [facility] LOCATION: [facility] PHYSICIAN: Eduardo Osier. Sharyn Lull, MD  Discharge Summary   DATE OF DISCHARGE: 02/20/2023  ADMITTING DIAGNOSES: 1.  Acute recurrent adrenal insufficiency. 2.  Community-acquired pneumonia. 3.  Severe hypokalemia. 4.  Moderate hypomagnesemia. 5.  Mild hyponatremia. 6.  Weakness due to above. 7.  Coronary artery disease, history of coronary stents in the past. 8.  Hypertension. 9.  Hyperlipidemia. 10.  Status post pituitary adenoma. 11.  Dehydration.  FINAL DIAGNOSES: 1.  Status post acute recurrent adrenal insufficiency. 2.  Status post possible bilateral pneumonia. 3.  History of pituitary adenoma, status post surgery and radiation in the past. 4.  Coronary artery disease, status post coronary stents in the past. 5.  Hypertension. 6.  Hyperlipidemia. 7.  Status post hypotensive shock, status post metabolic acidosis, status post hyponatremia and hypokalemia. 8.  Mild hypomagnesemia.  DISCHARGE HOME MEDICATIONS: 1.  Potassium chloride 20 mEq 1 tablet daily. 2.  Spironolactone 25 mg half tablet daily. 3.  Albuterol inhaler 2 puffs every 6 hours as needed. 4.  Aspirin 81 mg daily. 5.  Feeding supplement twice daily as before. 6.  Norco 5/325 mg 1 tablet every 6 hours as needed for moderate pain as before. 7.  Hydrocortisone 20 mg 1 tablet every morning and 10 mg every evening as before. 8.  Meclizine 25 mg twice daily as needed for dizziness. 9.  PreserVision 1 capsule every morning. 10.  Rosuvastatin 20 mg 1 tablet daily. 11.  The patient has been advised to stop losartan for now.  DIET:  Low salt, low cholesterol.  FOLLOWUP: With Dr. Algie Coffer in 1 week.  CONDITION AT DISCHARGE: Stable.  BRIEF HISTORY:  The patient is an 88 year old female with past medical history significant for adrenal insufficiency, history of pituitary adenoma, status  post surgery and radiation in the past, hypertension, hyperlipidemia, coronary artery disease,  history of stenting in the past, had episode of weakness associated with hypotension, post bilateral lower lobe pneumonia.  She may have some confusion over taking her medications, mostly Cortef for the last few days.  She was hypotensive on arrival in  the ED.  Post fluid resuscitation, her blood pressure has improved.  She had significant hypokalemia, hypomagnesemia and hyponatremia.  She had decreased confusion post 100 mg of Solu-Cortef IV.  PHYSICAL EXAMINATION: GENERAL:  She was awake. VITAL SIGNS:  Her blood pressure was 135/71, pulse 77.  She was afebrile. HEENT:  Conjunctivae was pink.  Cornea was clear.  Head normocephalic, atraumatic. NECK:  Supple, no JVD, no bruit.  Trachea was midline.  Thyroid not enlarged. LUNGS:  Clearing to auscultation bilaterally. CARDIOVASCULAR:  Regular rate and rhythm.  S1, S2 was normal.  There was 2/6 systolic murmur.  No click, gallop or rub. ABDOMEN:  Soft, nontender, bowel sounds were normal, no organomegaly. EXTREMITIES:  No edema, cyanosis or clubbing. NEUROLOGIC:  She was alert and oriented x 2.  Normal strength, normal coordination.  LABORATORY DATA:  Sodium was 127, potassium 2.9, BUN was 7, creatinine 1.14, magnesium was 1.5.  Hemoglobin was 15.2, hematocrit 45.8, white count of 3.7.  Her blood sugar was 141.  Two sets of high sensitivity troponin I were negative.  Her BNP was  22.2.  Her lactic acid level was elevated to 2.9, repeat was 2.0.  The patient had CT of the chest that showed no evidence of pulmonary embolism, coronary artery disease.  Small patchy infiltrates  are seen in both lower lung fields suggestive of  subsegmental atelectasis/pneumonia.  Small hiatus hernia.  Repeat labs today, sodium is 138, potassium is 3.6, BUN 10, creatinine 1.17, magnesium is still low at 1.5 which will be replaced IV prior to the discharge, blood sugar is 109.   Her last  hemoglobin was 12.9, hematocrit 37.7, white count of 4.1.  Her hemoglobin A1c was 6.2.  TSH was normal at 3.19.  HOSPITAL COURSE:  The patient was admitted to telemetry unit.  The patient was started on IV steroids which was switched to p.o.  The patient was also started on broad spectrum IV antibiotics.  Her blood cultures have been negative.  The patient has  remained afebrile during the hospital stay.  The patient received multiple boluses of IV potassium and also magnesium during the hospital stay.  The patient's OT, PT consultation was obtained.  The patient is ambulating with assistance, states she lives  with her son and very eager to go home.  The patient will be discharged home on above medications and will be followed up by Dr. Algie Coffer next week.  We will repeat her labs as outpatient in 1 week.   SHW D: 02/20/2023 9:32:04 am T: 02/20/2023 10:30:00 am  JOB: [id number]/ [id number]

## 2023-07-30 ENCOUNTER — Inpatient Hospital Stay (HOSPITAL_COMMUNITY)
Admission: EM | Admit: 2023-07-30 | Discharge: 2023-08-01 | DRG: 314 | Disposition: A | Discharge status: Home / Self Care | Payer: Medicare PPO | Attending: Cardiovascular Disease | Admitting: Cardiovascular Disease

## 2023-07-30 ENCOUNTER — Encounter (HOSPITAL_COMMUNITY): Payer: Self-pay

## 2023-07-30 ENCOUNTER — Emergency Department (HOSPITAL_COMMUNITY): Payer: Medicare PPO

## 2023-07-30 ENCOUNTER — Other Ambulatory Visit: Payer: Self-pay

## 2023-07-30 DIAGNOSIS — Z7982 Long term (current) use of aspirin: Secondary | ICD-10-CM | POA: Diagnosis not present

## 2023-07-30 DIAGNOSIS — Z87891 Personal history of nicotine dependence: Secondary | ICD-10-CM | POA: Diagnosis not present

## 2023-07-30 DIAGNOSIS — Z955 Presence of coronary angioplasty implant and graft: Secondary | ICD-10-CM

## 2023-07-30 DIAGNOSIS — Z808 Family history of malignant neoplasm of other organs or systems: Secondary | ICD-10-CM

## 2023-07-30 DIAGNOSIS — I959 Hypotension, unspecified: Secondary | ICD-10-CM | POA: Diagnosis present

## 2023-07-30 DIAGNOSIS — M25561 Pain in right knee: Secondary | ICD-10-CM | POA: Diagnosis present

## 2023-07-30 DIAGNOSIS — I252 Old myocardial infarction: Secondary | ICD-10-CM | POA: Diagnosis not present

## 2023-07-30 DIAGNOSIS — E7800 Pure hypercholesterolemia, unspecified: Secondary | ICD-10-CM | POA: Diagnosis present

## 2023-07-30 DIAGNOSIS — Z79899 Other long term (current) drug therapy: Secondary | ICD-10-CM

## 2023-07-30 DIAGNOSIS — Z981 Arthrodesis status: Secondary | ICD-10-CM

## 2023-07-30 DIAGNOSIS — E860 Dehydration: Secondary | ICD-10-CM | POA: Diagnosis present

## 2023-07-30 DIAGNOSIS — Z8673 Personal history of transient ischemic attack (TIA), and cerebral infarction without residual deficits: Secondary | ICD-10-CM

## 2023-07-30 DIAGNOSIS — I2510 Atherosclerotic heart disease of native coronary artery without angina pectoris: Secondary | ICD-10-CM | POA: Diagnosis present

## 2023-07-30 DIAGNOSIS — Z85828 Personal history of other malignant neoplasm of skin: Secondary | ICD-10-CM

## 2023-07-30 DIAGNOSIS — Z801 Family history of malignant neoplasm of trachea, bronchus and lung: Secondary | ICD-10-CM

## 2023-07-30 DIAGNOSIS — I10 Essential (primary) hypertension: Secondary | ICD-10-CM | POA: Diagnosis present

## 2023-07-30 DIAGNOSIS — R7989 Other specified abnormal findings of blood chemistry: Secondary | ICD-10-CM | POA: Diagnosis present

## 2023-07-30 DIAGNOSIS — R55 Syncope and collapse: Secondary | ICD-10-CM | POA: Diagnosis present

## 2023-07-30 DIAGNOSIS — E876 Hypokalemia: Secondary | ICD-10-CM | POA: Diagnosis present

## 2023-07-30 DIAGNOSIS — J189 Pneumonia, unspecified organism: Principal | ICD-10-CM | POA: Diagnosis present

## 2023-07-30 DIAGNOSIS — N179 Acute kidney failure, unspecified: Principal | ICD-10-CM | POA: Diagnosis present

## 2023-07-30 DIAGNOSIS — J9601 Acute respiratory failure with hypoxia: Secondary | ICD-10-CM

## 2023-07-30 DIAGNOSIS — K219 Gastro-esophageal reflux disease without esophagitis: Secondary | ICD-10-CM | POA: Diagnosis present

## 2023-07-30 DIAGNOSIS — D496 Neoplasm of unspecified behavior of brain: Secondary | ICD-10-CM | POA: Diagnosis present

## 2023-07-30 DIAGNOSIS — R0902 Hypoxemia: Secondary | ICD-10-CM | POA: Diagnosis present

## 2023-07-30 DIAGNOSIS — Z803 Family history of malignant neoplasm of breast: Secondary | ICD-10-CM

## 2023-07-30 DIAGNOSIS — E2740 Unspecified adrenocortical insufficiency: Secondary | ICD-10-CM | POA: Diagnosis present

## 2023-07-30 DIAGNOSIS — G8929 Other chronic pain: Secondary | ICD-10-CM | POA: Diagnosis present

## 2023-07-30 DIAGNOSIS — Z923 Personal history of irradiation: Secondary | ICD-10-CM

## 2023-07-30 DIAGNOSIS — Z885 Allergy status to narcotic agent status: Secondary | ICD-10-CM | POA: Diagnosis not present

## 2023-07-30 LAB — URINALYSIS, ROUTINE W REFLEX MICROSCOPIC
Bilirubin Urine: NEGATIVE
Glucose, UA: NEGATIVE mg/dL
Hgb urine dipstick: NEGATIVE
Ketones, ur: NEGATIVE mg/dL
Nitrite: NEGATIVE
Protein, ur: NEGATIVE mg/dL
Specific Gravity, Urine: 1.013 (ref 1.005–1.030)
WBC, UA: >50 WBC/hpf (ref 0–5)
pH: 5 (ref 5.0–8.0)

## 2023-07-30 LAB — CBC
HCT: 37.5 % (ref 36.0–46.0)
Hemoglobin: 12.9 g/dL (ref 12.0–15.0)
MCH: 33.1 pg (ref 26.0–34.0)
MCHC: 34.4 g/dL (ref 30.0–36.0)
MCV: 96.2 fL (ref 80.0–100.0)
Platelets: 238 10*3/uL (ref 150–400)
RBC: 3.9 MIL/uL (ref 3.87–5.11)
RDW: 13.4 % (ref 11.5–15.5)
WBC: 4.9 10*3/uL (ref 4.0–10.5)
nRBC: 0 % (ref 0.0–0.2)

## 2023-07-30 LAB — I-STAT CHEM 8, ED
BUN: 16 mg/dL (ref 8–23)
Calcium, Ion: 1.01 mmol/L — ABNORMAL LOW (ref 1.15–1.40)
Chloride: 105 mmol/L (ref 98–111)
Creatinine, Ser: 1.7 mg/dL — ABNORMAL HIGH (ref 0.44–1.00)
Glucose, Bld: 83 mg/dL (ref 70–99)
HCT: 39 % (ref 36.0–46.0)
Hemoglobin: 13.3 g/dL (ref 12.0–15.0)
Potassium: 2.5 mmol/L — CL (ref 3.5–5.1)
Sodium: 143 mmol/L (ref 135–145)
TCO2: 25 mmol/L (ref 22–32)

## 2023-07-30 LAB — BASIC METABOLIC PANEL
Anion gap: 18 — ABNORMAL HIGH (ref 5–15)
BUN: 14 mg/dL (ref 8–23)
CO2: 26 mmol/L (ref 22–32)
Calcium: 9 mg/dL (ref 8.9–10.3)
Chloride: 101 mmol/L (ref 98–111)
Creatinine, Ser: 1.69 mg/dL — ABNORMAL HIGH (ref 0.44–1.00)
GFR, Estimated: 29 mL/min — ABNORMAL LOW (ref >60)
Glucose, Bld: 84 mg/dL (ref 70–99)
Potassium: 2.5 mmol/L — CL (ref 3.5–5.1)
Sodium: 145 mmol/L (ref 135–145)

## 2023-07-30 LAB — HEPATIC FUNCTION PANEL
ALT: 17 U/L (ref 0–44)
AST: 32 U/L (ref 15–41)
Albumin: 3.3 g/dL — ABNORMAL LOW (ref 3.5–5.0)
Alkaline Phosphatase: 57 U/L (ref 38–126)
Bilirubin, Direct: 0.2 mg/dL (ref 0.0–0.2)
Indirect Bilirubin: 0.6 mg/dL (ref 0.3–0.9)
Total Bilirubin: 0.8 mg/dL (ref <1.2)
Total Protein: 5.8 g/dL — ABNORMAL LOW (ref 6.5–8.1)

## 2023-07-30 LAB — TROPONIN I (HIGH SENSITIVITY)
Troponin I (High Sensitivity): 12 ng/L (ref <18)
Troponin I (High Sensitivity): 14 ng/L (ref <18)

## 2023-07-30 LAB — LACTIC ACID, PLASMA: Lactic Acid, Venous: 1.1 mmol/L (ref 0.5–1.9)

## 2023-07-30 LAB — MAGNESIUM: Magnesium: 1.9 mg/dL (ref 1.7–2.4)

## 2023-07-30 LAB — BRAIN NATRIURETIC PEPTIDE: B Natriuretic Peptide: 22.4 pg/mL (ref 0.0–100.0)

## 2023-07-30 LAB — CBG MONITORING, ED: Glucose-Capillary: 181 mg/dL — ABNORMAL HIGH (ref 70–99)

## 2023-07-30 LAB — TSH: TSH: 4.096 u[IU]/mL (ref 0.350–4.500)

## 2023-07-30 LAB — D-DIMER, QUANTITATIVE: D-Dimer, Quant: 3.22 ug{FEU}/mL — ABNORMAL HIGH (ref 0.00–0.50)

## 2023-07-30 MED ORDER — POTASSIUM CHLORIDE IN NACL 20-0.9 MEQ/L-% IV SOLN
INTRAVENOUS | Status: DC
  Filled 2023-07-30: qty 1000

## 2023-07-30 MED ORDER — POTASSIUM CHLORIDE 10 MEQ/100ML IV SOLN
10.0000 meq | INTRAVENOUS | Status: AC
Start: 2023-07-30 — End: 2023-07-30
  Administered 2023-07-30: 10 meq via INTRAVENOUS
  Filled 2023-07-30: qty 100

## 2023-07-30 MED ORDER — ASPIRIN 81 MG PO TBEC
81.0000 mg | DELAYED_RELEASE_TABLET | Freq: Every day | ORAL | Status: DC
  Administered 2023-07-30 – 2023-08-01 (×3): 81 mg via ORAL
  Filled 2023-07-30 (×3): qty 1

## 2023-07-30 MED ORDER — SPIRONOLACTONE 12.5 MG HALF TABLET
12.5000 mg | ORAL_TABLET | Freq: Every day | ORAL | Status: DC
  Administered 2023-07-30 – 2023-08-01 (×3): 12.5 mg via ORAL
  Filled 2023-07-30 (×3): qty 1

## 2023-07-30 MED ORDER — HYDROCORTISONE 20 MG PO TABS
20.0000 mg | ORAL_TABLET | ORAL | Status: DC
  Administered 2023-07-31 – 2023-08-01 (×2): 20 mg via ORAL
  Filled 2023-07-30 (×2): qty 1

## 2023-07-30 MED ORDER — HEPARIN SODIUM (PORCINE) 5000 UNIT/ML IJ SOLN
5000.0000 [IU] | Freq: Three times a day (TID) | INTRAMUSCULAR | Status: DC
  Administered 2023-07-30 – 2023-08-01 (×6): 5000 [IU] via SUBCUTANEOUS
  Filled 2023-07-30 (×6): qty 1

## 2023-07-30 MED ORDER — ALBUTEROL SULFATE (2.5 MG/3ML) 0.083% IN NEBU: 2.5000 mg | INHALATION_SOLUTION | RESPIRATORY_TRACT | Status: DC | PRN

## 2023-07-30 MED ORDER — METHYLPREDNISOLONE SODIUM SUCC 125 MG IJ SOLR
125.0000 mg | Freq: Once | INTRAMUSCULAR | Status: AC
  Administered 2023-07-30: 125 mg via INTRAVENOUS
  Filled 2023-07-30: qty 2

## 2023-07-30 MED ORDER — HYDROCODONE-ACETAMINOPHEN 5-325 MG PO TABS: 1.0000 | ORAL_TABLET | Freq: Every evening | ORAL | Status: DC | PRN

## 2023-07-30 MED ORDER — HYDROCORTISONE 10 MG PO TABS
10.0000 mg | ORAL_TABLET | Freq: Every evening | ORAL | Status: DC
  Administered 2023-07-30: 10 mg via ORAL
  Filled 2023-07-30 (×2): qty 1

## 2023-07-30 MED ORDER — SODIUM CHLORIDE 0.9 % IV SOLN
500.0000 mg | Freq: Once | INTRAVENOUS | Status: AC
  Administered 2023-07-30: 500 mg via INTRAVENOUS
  Filled 2023-07-30: qty 5

## 2023-07-30 MED ORDER — LACTATED RINGERS IV BOLUS
500.0000 mL | Freq: Once | INTRAVENOUS | Status: AC
  Administered 2023-07-30: 500 mL via INTRAVENOUS

## 2023-07-30 MED ORDER — PROSIGHT PO TABS
1.0000 | ORAL_TABLET | Freq: Every day | ORAL | Status: DC
  Administered 2023-07-31 – 2023-08-01 (×2): 1 via ORAL
  Filled 2023-07-30 (×2): qty 1

## 2023-07-30 MED ORDER — POTASSIUM CHLORIDE 20 MEQ PO PACK
40.0000 meq | PACK | Freq: Once | ORAL | Status: AC
  Administered 2023-07-30: 40 meq via ORAL
  Filled 2023-07-30: qty 2

## 2023-07-30 MED ORDER — PRESERVISION AREDS 2 PO CAPS: 1.0000 | ORAL_CAPSULE | Freq: Every morning | ORAL | Status: DC

## 2023-07-30 MED ORDER — ROSUVASTATIN CALCIUM 20 MG PO TABS
20.0000 mg | ORAL_TABLET | Freq: Every evening | ORAL | Status: DC
  Administered 2023-07-30 – 2023-07-31 (×2): 20 mg via ORAL
  Filled 2023-07-30 (×2): qty 1

## 2023-07-30 MED ORDER — SODIUM CHLORIDE 0.9 % IV SOLN
1.0000 g | Freq: Once | INTRAVENOUS | Status: AC
  Administered 2023-07-30: 1 g via INTRAVENOUS
  Filled 2023-07-30: qty 10

## 2023-07-30 NOTE — ED Notes (Signed)
Phlebotomy to obtain labs. 

## 2023-07-30 NOTE — ED Notes (Signed)
Phlebotomy at bedside.

## 2023-07-30 NOTE — ED Notes (Signed)
Per MD Algie Coffer, dc runs of potassium to replace with infusion once available from main pharmacy. Per admitting Algie Coffer, MD to place order for another oral potassium supplement if magnesium result shifts.

## 2023-07-30 NOTE — ED Triage Notes (Signed)
Patient arrived by [facility] after syncopal episode this am. Patient reports that she didn't feel well this am when she awoke and had nausea and dizziness. Ems found initial BP 80s-received 250 NS and BP 100s on arrival. Syncope in chair-did not fall. Patient denies pain, no thinners- Arrived with PIV Cbg 150

## 2023-07-30 NOTE — ED Notes (Signed)
Pt on 4L 02 [place] to maintain oxygen saturation while sleeping

## 2023-07-30 NOTE — ED Provider Notes (Addendum)
[place] EMERGENCY DEPARTMENT AT [facility] Provider Note   CSN: [id number] Arrival date & time: 07/30/23  1045     History  No chief complaint on file.   Adriana Jordan is a 88 y.o. female.  HPI Patient presents for syncope.  Medical history includes CAD, chronic pain, GERD, HLD, HTN, pituitary adenoma, CVA.  She reports that she was in her normal state of health yesterday.  This morning, she woke up with a generalized headache.  She took Advil and headache did resolve.  She had some mild dizziness and nausea.  She went to sit on the couch.  While seated on the couch, she had a syncopal episode.  This is seen by her son.  She denies any specific prodrome shortly prior to passing out.  Since that episode, she has had fatigue.  She denies any current areas of discomfort other than her chronic discomfort in her right knee.  She does endorse fatigue and generalized weakness.  Other than the Advil for headache, she did not take any medications this morning.  She arrives in the ED via EMS.  EMS noted hypotension with SBP in the 80s.  This improved to the 100 following 250 cc C.  CBG was normal prior to arrival.  Although she is not on supplemental oxygen at baseline, EMS did place her on 3 L.    Home Medications Prior to Admission medications   Medication Sig Start Date End Date Taking? Authorizing Provider  albuterol (VENTOLIN HFA) 108 (90 Base) MCG/ACT inhaler Inhale 2 puffs into the lungs every 6 (six) hours as needed for shortness of breath. 11/22/22   [provider]  aspirin EC 81 MG tablet Take 81 mg by mouth daily.    [provider]  feeding supplement (ENSURE ENLIVE / ENSURE PLUS) LIQD Take 237 mLs by mouth 2 (two) times daily between meals. 02/20/23   Rinaldo Cloud, MD  HYDROcodone-acetaminophen (NORCO/VICODIN) 5-325 MG tablet Take 1 tablet by mouth every 6 (six) hours as needed for moderate pain. 12/30/22   [provider]  hydrocortisone  (CORTEF) 10 MG tablet Take 1 tablet (10 mg total) by mouth every evening. 06/23/22   Hongalgi, Maximino Greenland, MD  hydrocortisone (CORTEF) 20 MG tablet Take 1 tablet (20 mg total) by mouth every morning. 03/06/22   Regalado, Belkys A, MD  meclizine (ANTIVERT) 25 MG tablet Take 25 mg by mouth 2 (two) times daily as needed for dizziness. 08/25/22   [provider]  Multiple Vitamins-Minerals (PRESERVISION AREDS 2) CAPS Take 1 capsule by mouth every morning. 02/20/23   Rinaldo Cloud, MD  potassium chloride SA (KLOR-CON M) 20 MEQ tablet Take 1 tablet (20 mEq total) by mouth daily. 02/20/23   Rinaldo Cloud, MD  rosuvastatin (CRESTOR) 20 MG tablet Take 20 mg by mouth every evening. 10/15/22   [provider]  spironolactone (ALDACTONE) 25 MG tablet Take 0.5 tablets (12.5 mg total) by mouth daily. 02/20/23   Rinaldo Cloud, MD      Allergies    Codeine    Review of Systems   Review of Systems  Constitutional:  Positive for fatigue.  Respiratory:  Positive for shortness of breath.   Gastrointestinal:  Positive for nausea.  Neurological:  Positive for dizziness, syncope, weakness (Generalized) and headaches.  All other systems reviewed and are negative.   Physical Exam Updated Vital Signs BP 100/69   Pulse 68   Temp (!) 97.5 F (36.4 C) (Oral)   Resp Marland Kitchen)  22   LMP  (LMP Unknown)   SpO2 99%  Physical Exam Vitals and nursing note reviewed.  Constitutional:      General: She is not in acute distress.    Appearance: Normal appearance. She is well-developed. She is not ill-appearing, toxic-appearing or diaphoretic.  HENT:     Head: Normocephalic and atraumatic.     Right Ear: External ear normal.     Left Ear: External ear normal.     Nose: Nose normal.     Mouth/Throat:     Mouth: Mucous membranes are moist.  Eyes:     Extraocular Movements: Extraocular movements intact.     Conjunctiva/sclera: Conjunctivae normal.  Cardiovascular:     Rate and Rhythm: Normal rate and regular  rhythm.     Heart sounds: Murmur heard.  Pulmonary:     Effort: Pulmonary effort is normal. No respiratory distress.     Breath sounds: Normal breath sounds. No wheezing or rales.  Chest:     Chest wall: No tenderness.  Abdominal:     General: There is no distension.     Palpations: Abdomen is soft.     Tenderness: There is no abdominal tenderness.  Musculoskeletal:        General: No swelling, deformity or signs of injury.     Cervical back: Normal range of motion and neck supple.     Right lower leg: Edema present.     Left lower leg: Edema present.  Skin:    General: Skin is warm and dry.     Coloration: Skin is not jaundiced or pale.  Neurological:     General: No focal deficit present.     Mental Status: She is alert and oriented to person, place, and time.  Psychiatric:        Mood and Affect: Mood normal. Affect is flat.        Behavior: Behavior is slowed. Behavior is cooperative.     ED Results / Procedures / Treatments   Labs (all labs ordered are listed, but only abnormal results are displayed) Labs Reviewed  BASIC METABOLIC PANEL - Abnormal; Notable for the following components:      Result Value   Potassium 2.5 (*)    Creatinine, Ser 1.69 (*)    GFR, Estimated 29 (*)    Anion gap 18 (*)    All other components within normal limits  D-DIMER, QUANTITATIVE - Abnormal; Notable for the following components:   D-Dimer, Quant 3.22 (*)    All other components within normal limits  I-STAT CHEM 8, ED - Abnormal; Notable for the following components:   Potassium 2.5 (*)    Creatinine, Ser 1.70 (*)    Calcium, Ion 1.01 (*)    All other components within normal limits  CBC  BRAIN NATRIURETIC PEPTIDE  TSH  LACTIC ACID, PLASMA  URINALYSIS, ROUTINE W REFLEX MICROSCOPIC  LACTIC ACID, PLASMA  HEPATIC FUNCTION PANEL  MAGNESIUM  CBG MONITORING, ED  TROPONIN I (HIGH SENSITIVITY)  TROPONIN I (HIGH SENSITIVITY)    EKG EKG Interpretation Date/Time:  Saturday  July 30 2023 10:50:31 EST Ventricular Rate:  80 PR Interval:  182 QRS Duration:  92 QT Interval:  428 QTC Calculation: 493 R Axis:   -36  Text Interpretation: Normal sinus rhythm Left axis deviation Moderate voltage criteria for LVH, may be normal variant ( R in aVL , Cornell product ) Anterolateral infarct , age undetermined Abnormal ECG Confirmed by Gloris Manchester [phone]) on 07/30/2023 11:23:20 AM  Radiology CT Chest Wo Contrast Result Date: 07/30/2023 CLINICAL DATA:  Respiratory illness, syncope this morning EXAM: CT CHEST WITHOUT CONTRAST TECHNIQUE: Multidetector CT imaging of the chest was performed following the standard protocol without IV contrast. RADIATION DOSE REDUCTION: This exam was performed according to the departmental dose-optimization program which includes automated exposure control, adjustment of the mA and/or kV according to patient size and/or use of iterative reconstruction technique. COMPARISON:  02/14/2023 FINDINGS: Cardiovascular: Aortic atherosclerosis. Aortic valve calcifications. Normal heart size. Three-vessel coronary artery calcifications and stents. No pericardial effusion. Mediastinum/Nodes: No enlarged mediastinal, hilar, or axillary lymph nodes. Small hiatal hernia. Thyroid gland, trachea, and esophagus demonstrate no significant findings. Lungs/Pleura: Mild bibasilar scarring or atelectasis. No acute appearing airspace opacity. No pleural effusion or pneumothorax. Upper Abdomen: No acute abnormality. Musculoskeletal: No chest wall abnormality. No acute osseous findings. IMPRESSION: 1. No acute noncontrast CT findings of the chest. 2. Mild bibasilar scarring or atelectasis. No acute appearing airspace opacity. 3. Coronary artery disease. 4. Aortic valve calcifications. Correlate for echocardiographic evidence of aortic valve dysfunction. Aortic Atherosclerosis (ICD10-I70.0). Electronically Signed   By: Jearld Lesch M.D.   On: 07/30/2023 15:37   CT HEAD WO  CONTRAST Result Date: 07/30/2023 CLINICAL DATA:  Syncope presyncope. EXAM: CT HEAD WITHOUT CONTRAST TECHNIQUE: Contiguous axial images were obtained from the base of the skull through the vertex without intravenous contrast. RADIATION DOSE REDUCTION: This exam was performed according to the departmental dose-optimization program which includes automated exposure control, adjustment of the mA and/or kV according to patient size and/or use of iterative reconstruction technique. COMPARISON:  06/21/2022 FINDINGS: Brain: There is no evidence for acute hemorrhage, hydrocephalus, mass lesion, or abnormal extra-axial fluid collection. No definite CT evidence for acute infarction. Diffuse loss of parenchymal volume is consistent with atrophy. Patchy low attenuation in the deep hemispheric and periventricular white matter is nonspecific, but likely reflects chronic microvascular ischemic demyelination. Vascular: No hyperdense vessel or unexpected calcification. Skull: No evidence for fracture. No worrisome lytic or sclerotic lesion. Sinuses/Orbits: Stable hypoplastic opacified right sphenoid sinus. The remaining visualized paranasal sinuses and mastoid air cells are clear. Other: None. IMPRESSION: 1. Stable.  No acute intracranial abnormality. 2. Atrophy with chronic small vessel ischemic disease. Electronically Signed   By: Kennith Center M.D.   On: 07/30/2023 11:57   DG Chest Port 1 View Result Date: 07/30/2023 CLINICAL DATA:  Syncope EXAM: PORTABLE CHEST 1 VIEW COMPARISON:  01/30/2023 FINDINGS: Stable cardiomediastinal silhouette. Aortic atherosclerotic calcification. Similar interstitial prominence most marked in the mid and lower lungs. Similar ground-glass opacities in the lower lungs. No large pleural effusion. No pneumothorax. IMPRESSION: Similar interstitial prominence and ground-glass opacities in the mid and lower lungs. Findings may be due to edema or infection. Electronically Signed   By: Minerva Fester  M.D.   On: 07/30/2023 11:40    Procedures Procedures    Medications Ordered in ED Medications  potassium chloride (KLOR-CON) packet 40 mEq (has no administration in time range)  potassium chloride 10 mEq in 100 mL IVPB (has no administration in time range)  lactated ringers bolus 500 mL (0 mLs Intravenous Stopped 07/30/23 1335)  cefTRIAXone (ROCEPHIN) 1 g in sodium chloride 0.9 % 100 mL IVPB (0 g Intravenous Stopped 07/30/23 1251)  azithromycin (ZITHROMAX) 500 mg in sodium chloride 0.9 % 250 mL IVPB (0 mg Intravenous Stopped 07/30/23 1506)  lactated ringers bolus 500 mL (0 mLs Intravenous Stopped 07/30/23 1605)    ED Course/ Medical Decision Making/ A&P  Medical Decision Making Amount and/or Complexity of Data Reviewed Labs: ordered. Radiology: ordered.  Risk Prescription drug management. Decision regarding hospitalization.   This patient presents to the ED for concern of syncope, this involves an extensive number of treatment options, and is a complaint that carries with it a high risk of complications and morbidity.  The differential diagnosis includes vasovagal episode, TIA, arrhythmia, PE, infection, metabolic derangements, dehydration   Co morbidities that complicate the patient evaluation  CAD, chronic pain, GERD, HLD, HTN, pituitary adenoma, CVA   Additional history obtained:  Additional history obtained from N/A External records from outside source obtained and reviewed including EMR   Lab Tests:  I Ordered, and personally interpreted labs.  The pertinent results include: On i-STAT, AKI appears to be present.  Hypokalemia is present as well.  D-dimer is elevated raising concern for PE.   Imaging Studies ordered:  I ordered imaging studies including chest x-ray, CT of head and chest, VQ scan I independently visualized and interpreted imaging which showed interstitial prominence groundglass opacities in mid and lower lungs  concerning for edema and/or infection; no acute findings on CT head; remaining imaging pending at time of admission I agree with the radiologist interpretation   Cardiac Monitoring: / EKG:  The patient was maintained on a cardiac monitor.  I personally viewed and interpreted the cardiac monitored which showed an underlying rhythm of: Sinus rhythm   Consultations Obtained:  I requested consultation with the cardiologist, Dr. Algie Coffer,  and discussed lab and imaging findings as well as pertinent plan - they recommend: Will admit his patient   Problem List / ED Course / Critical interventions / Medication management  Patient presents after syncopal episode at home.  Associated symptoms include lightheadedness and dizziness.  On arrival, she endorses fatigue.  She denies any new areas of pain.  She does have chronic right knee pain which has not changed.  On exam, she is alert and oriented.  She has no focal neurologic deficits.  Her breathing is unlabored.  She is found be hypoxic in the field as well as on arrival.  She was kept on supplemental oxygen.  Lungs are clear to auscultation.  Right knee does not show any swelling, erythema, or warmth.  Workup was initiated.  Initial lab findings notable for AKI and hypokalemia.  This was on i-STAT.  Will provide further IV fluids.  Will await confirmation of hypokalemia on BMP.  Patient does not have a leukocytosis.  Because of her current GFR, she is not a candidate for contrasted studies.  D-dimer was ordered and was found to be elevated.  VQ scan was ordered.  Chest x-ray does show concern for infection and patient was treated empirically for pneumonia.  Noncontrasted CT scan of chest was ordered to further evaluate.  I spoke with her cardiologist/PCP, Dr. Algie Coffer, who will admit to his service.  BMP confirmed hypokalemia.  Replacement potassium was ordered. I ordered medication including IV fluids for hydration; ceftriaxone and azithromycin for empiric  treatment of pneumonia; potassium chloride for hypokalemia Reevaluation of the patient after these medicines showed that the patient improved I have reviewed the patients home medicines and have made adjustments as needed   Social Determinants of Health:  Has PCP  CRITICAL CARE Performed by: Gloris Manchester   Total critical care time: 34 minutes  Critical care time was exclusive of separately billable procedures and treating other patients.  Critical care was necessary to treat or prevent imminent or life-threatening deterioration.  Critical care was time spent personally by me on the following activities: development of treatment plan with patient and/or surrogate as well as nursing, discussions with consultants, evaluation of patient's response to treatment, examination of patient, obtaining history from patient or surrogate, ordering and performing treatments and interventions, ordering and review of laboratory studies, ordering and review of radiographic studies, pulse oximetry and re-evaluation of patient's condition.        Final Clinical Impression(s) / ED Diagnoses Final diagnoses:  AKI (acute kidney injury) (HCC)  Acute respiratory failure with hypoxia (HCC)  Hypokalemia    Rx / DC Orders ED Discharge Orders     None         Gloris Manchester, MD 07/30/23 1537    Gloris Manchester, MD 07/30/23 [phone]

## 2023-07-30 NOTE — H&P (Signed)
Referring Physician:   [name] is an 88 y.o. female.                       Chief Complaint: Syncope  HPI: 88 years old black female with PMH of Adrenal insufficiency, pituitary adenoma- s/p surgery and radiation Tx, HTN, HLD and CAD had episode of weakness and hypotension. In ER she was also hypoxic. Chest x-ray and CT chest without contrast shows atelectasis and small possibility of pneumonia. She claims to take her her medications mostly Cortef regularly. She was hypotensive on arrival to ED. Post fluid resuscitation her BP has improved. She has significant hypokalemia. Her CBC, TSH, Troponin I, BNP and lactic acid levels are normal. She denies chest pain or palpitation. EKG shows sinus rhythm with antero-lateral infarct and possible LVH.  Past Medical History:  Diagnosis Date   Arthritis    "all over" (11/14/2017)   Brain tumor (HCC)    "I still have it"; not cancer (11/14/2017)   Chronic lower back pain    Coronary artery disease    Dr. Algie Coffer   GERD (gastroesophageal reflux disease)    History of blood transfusion    "don't remember why" (11/14/2017)   History of radiation therapy 07/03/2018   brain, pituitary/ 12.5 Gy in 1 fraction   Hypercholesteremia    Hypertension    MI (myocardial infarction) (HCC) 2006   PONV (postoperative nausea and vomiting)    Skin cancer    "right forearm"      Past Surgical History:  Procedure Laterality Date   ANTERIOR CERVICAL DECOMP/DISCECTOMY FUSION     BACK SURGERY     CARPAL TUNNEL RELEASE Bilateral    CORONARY ANGIOPLASTY     DES mid LAD and mid RCA 09/21/04   CRANIOTOMY N/A 05/21/2015   Procedure: Transsphenoidal resection of pituitary tumor with Dr. Narda Bonds for approach;  Surgeon: Hilda Lias, MD;  Location: [facility] NEURO ORS;  Service: Neurosurgery;  Laterality: N/A;  Transsphenoidal resection of pituitary tumor with Dr. Narda Bonds for approach   SHOULDER OPEN ROTATOR CUFF REPAIR Bilateral    SKIN CANCER EXCISION Right     forearm   thumb surgery Right    placed srews to make straight   THYROID SURGERY     "goiter removed"    Family History  Problem Relation Age of Onset   Cancer Father 60       throat- smoker    Breast cancer Sister 29   Cancer Brother 54       lung- smoker   Social History:  reports that she quit smoking about 54 years ago. Her smoking use included cigarettes. She started smoking about 59 years ago. She has a 0.6 pack-year smoking history. She has never used smokeless tobacco. She reports that she does not currently use alcohol. She reports that she does not use drugs.  Allergies:  Allergies  Allergen Reactions   Codeine Nausea And Vomiting    Tolerates hydrocodone    (Not in a hospital admission)   Results for orders placed or performed during the hospital encounter of 07/30/23 (from the past 48 hours)  CBC     Status: None   Collection Time: 07/30/23 11:58 AM  Result Value Ref Range   WBC 4.9 4.0 - 10.5 K/uL   RBC 3.90 3.87 - 5.11 MIL/uL   Hemoglobin 12.9 12.0 - 15.0 g/dL   HCT 01.0 27.2 - 53.6 %   MCV 96.2 80.0 - 100.0  fL   MCH 33.1 26.0 - 34.0 pg   MCHC 34.4 30.0 - 36.0 g/dL   RDW 17.5 10.2 - 58.5 %   Platelets 238 150 - 400 K/uL   nRBC 0.0 0.0 - 0.2 %    Comment: Performed at [facility], [address] [place], [place] [address]  Troponin I (High Sensitivity)     Status: None   Collection Time: 07/30/23 11:58 AM  Result Value Ref Range   Troponin I (High Sensitivity) 12 <18 ng/L    Comment: (NOTE) Elevated high sensitivity troponin I (hsTnI) values and significant  changes across serial measurements may suggest ACS but many other  chronic and acute conditions are known to elevate hsTnI results.  Refer to the "Links" section for chest pain algorithms and additional  guidance. Performed at [facility], [address], [place], [place] [address]   Brain natriuretic peptide     Status: None   Collection Time: 07/30/23 11:58 AM  Result Value  Ref Range   B Natriuretic Peptide 22.4 0.0 - 100.0 pg/mL    Comment: Performed at [facility], [address], [place], [place] [address]  TSH     Status: None   Collection Time: 07/30/23 11:58 AM  Result Value Ref Range   TSH 4.096 0.350 - 4.500 uIU/mL    Comment: Performed by a 3rd Generation assay with a functional sensitivity of <=0.01 uIU/mL. Performed at [facility], [address], [place], [place] [address]   Lactic acid, plasma     Status: None   Collection Time: 07/30/23 11:58 AM  Result Value Ref Range   Lactic Acid, Venous 1.1 0.5 - 1.9 mmol/L    Comment: Performed at [facility], [address], [place], [place] [address]  I-stat chem 8, ED (not at [facility], [facility] or [facility])     Status: Abnormal   Collection Time: 07/30/23  2:21 PM  Result Value Ref Range   Sodium 143 135 - 145 mmol/L   Potassium 2.5 (LL) 3.5 - 5.1 mmol/L   Chloride 105 98 - 111 mmol/L   BUN 16 8 - 23 mg/dL   Creatinine, Ser 9.50 (H) 0.44 - 1.00 mg/dL   Glucose, Bld 83 70 - 99 mg/dL    Comment: Glucose reference range applies only to samples taken after fasting for at least 8 hours.   Calcium, Ion 1.01 (L) 1.15 - 1.40 mmol/L   TCO2 25 22 - 32 mmol/L   Hemoglobin 13.3 12.0 - 15.0 g/dL   HCT 93.2 67.1 - 24.5 %   Comment NOTIFIED PHYSICIAN   Troponin I (High Sensitivity)     Status: None   Collection Time: 07/30/23  2:30 PM  Result Value Ref Range   Troponin I (High Sensitivity) 14 <18 ng/L    Comment: (NOTE) Elevated high sensitivity troponin I (hsTnI) values and significant  changes across serial measurements may suggest ACS but many other  chronic and acute conditions are known to elevate hsTnI results.  Refer to the "Links" section for chest pain algorithms and additional  guidance. Performed at [facility], [address], [place], [place] [address]   Basic metabolic panel     Status: Abnormal   Collection Time: 07/30/23  2:30 PM  Result Value Ref Range   Sodium 145 135  - 145 mmol/L   Potassium 2.5 (LL) 3.5 - 5.1 mmol/L    Comment: CRITICAL RESULT CALLED TO, READ  BACK BY AND VERIFIED WITH Randall Hiss RN , @1535 , 07/30/23, [place], T.   Chloride 101 98 - 111 mmol/L   CO2 26 22 - 32 mmol/L   Glucose, Bld 84 70 - 99 mg/dL    Comment: Glucose reference range applies only to samples taken after fasting for at least 8 hours.   BUN 14 8 - 23 mg/dL   Creatinine, Ser 7.37 (H) 0.44 - 1.00 mg/dL   Calcium 9.0 8.9 - 10.6 mg/dL   GFR, Estimated 29 (L) >60 mL/min    Comment: (NOTE) Calculated using the CKD-EPI Creatinine Equation (2021)    Anion gap 18 (H) 5 - 15    Comment: Performed at [facility], [address], [place], [place] [address]  D-dimer, quantitative     Status: Abnormal   Collection Time: 07/30/23  2:41 PM  Result Value Ref Range   D-Dimer, Quant 3.22 (H) 0.00 - 0.50 ug/mL-FEU    Comment: (NOTE) At the manufacturer cut-off value of 0.5 g/mL FEU, this assay has a negative predictive value of 95-100%.This assay is intended for use in conjunction with a clinical pretest probability (PTP) assessment model to exclude pulmonary embolism (PE) and deep venous thrombosis (DVT) in outpatients suspected of PE or DVT. Results should be correlated with clinical presentation. Performed at [facility], [address] [place], [place] [address]    CT Chest Wo Contrast Result Date: 07/30/2023 CLINICAL DATA:  Respiratory illness, syncope this morning EXAM: CT CHEST WITHOUT CONTRAST TECHNIQUE: Multidetector CT imaging of the chest was performed following the standard protocol without IV contrast. RADIATION DOSE REDUCTION: This exam was performed according to the departmental dose-optimization program which includes automated exposure control, adjustment of the mA and/or kV according to patient size and/or use of iterative reconstruction technique. COMPARISON:  02/14/2023 FINDINGS: Cardiovascular: Aortic atherosclerosis. Aortic valve calcifications.  Normal heart size. Three-vessel coronary artery calcifications and stents. No pericardial effusion. Mediastinum/Nodes: No enlarged mediastinal, hilar, or axillary lymph nodes. Small hiatal hernia. Thyroid gland, trachea, and esophagus demonstrate no significant findings. Lungs/Pleura: Mild bibasilar scarring or atelectasis. No acute appearing airspace opacity. No pleural effusion or pneumothorax. Upper Abdomen: No acute abnormality. Musculoskeletal: No chest wall abnormality. No acute osseous findings. IMPRESSION: 1. No acute noncontrast CT findings of the chest. 2. Mild bibasilar scarring or atelectasis. No acute appearing airspace opacity. 3. Coronary artery disease. 4. Aortic valve calcifications. Correlate for echocardiographic evidence of aortic valve dysfunction. Aortic Atherosclerosis (ICD10-I70.0). Electronically Signed   By: Jearld Lesch M.D.   On: 07/30/2023 15:37   CT HEAD WO CONTRAST Result Date: 07/30/2023 CLINICAL DATA:  Syncope presyncope. EXAM: CT HEAD WITHOUT CONTRAST TECHNIQUE: Contiguous axial images were obtained from the base of the skull through the vertex without intravenous contrast. RADIATION DOSE REDUCTION: This exam was performed according to the departmental dose-optimization program which includes automated exposure control, adjustment of the mA and/or kV according to patient size and/or use of iterative reconstruction technique. COMPARISON:  06/21/2022 FINDINGS: Brain: There is no evidence for acute hemorrhage, hydrocephalus, mass lesion, or abnormal extra-axial fluid collection. No definite CT evidence for acute infarction. Diffuse loss of parenchymal volume is consistent with atrophy. Patchy low attenuation in the deep hemispheric and periventricular white matter is nonspecific, but likely reflects chronic microvascular ischemic demyelination. Vascular: No hyperdense vessel or unexpected calcification. Skull: No evidence for fracture. No worrisome lytic or sclerotic lesion.  Sinuses/Orbits: Stable hypoplastic opacified right sphenoid sinus. The remaining visualized paranasal sinuses and mastoid air  cells are clear. Other: None. IMPRESSION: 1. Stable.  No acute intracranial abnormality. 2. Atrophy with chronic small vessel ischemic disease. Electronically Signed   By: Kennith Center M.D.   On: 07/30/2023 11:57   DG Chest Port 1 View Result Date: 07/30/2023 CLINICAL DATA:  Syncope EXAM: PORTABLE CHEST 1 VIEW COMPARISON:  01/30/2023 FINDINGS: Stable cardiomediastinal silhouette. Aortic atherosclerotic calcification. Similar interstitial prominence most marked in the mid and lower lungs. Similar ground-glass opacities in the lower lungs. No large pleural effusion. No pneumothorax. IMPRESSION: Similar interstitial prominence and ground-glass opacities in the mid and lower lungs. Findings may be due to edema or infection. Electronically Signed   By: Minerva Fester M.D.   On: 07/30/2023 11:40    Review Of Systems Constitutional: No fever, chills, weight loss or gain. Eyes: No vision change, wears glasses. No discharge or pain. Ears: No hearing loss, No tinnitus. Respiratory: No asthma, COPD, h/o pneumonias. No shortness of breath. No hemoptysis. Cardiovascular: H/O chest pain, palpitation, leg edema. Gastrointestinal: No nausea, vomiting, diarrhea, constipation. No GI bleed. No hepatitis. Genitourinary: No dysuria, hematuria, kidney stone. No incontinance. Neurological: No headache, stroke, seizures.  Psychiatry: No psych facility admission for anxiety, depression, suicide. No detox. Skin: No rash. Musculoskeletal: Positive joint pain, fibromyalgia, neck pain, back pain. Lymphadenopathy: No lymphadenopathy. Hematology: No anemia or easy bruising.   Blood pressure 100/69, pulse 68, temperature (!) 97.5 F (36.4 C), temperature source Oral, resp. rate (!) 22, SpO2 99%. There is no height or weight on file to calculate BMI. General appearance: alert, cooperative, appears  stated age and mild respiratory distress Head: Normocephalic, atraumatic. Eyes: Brown eyes, pink conjunctiva, corneas clear.  Neck: No adenopathy, no carotid bruit, no JVD, supple, symmetrical, trachea midline and thyroid not enlarged. Resp: Clearing to auscultation bilaterally. Cardio: Regular rate and rhythm, S1, S2 normal, II/VI systolic murmur, no click, rub or gallop GI: Soft, non-tender; bowel sounds normal; no organomegaly. Extremities: Trace edema, no cyanosis or clubbing. Skin: Warm and dry.  Neurologic: Alert and oriented X 2, normal strength. Normal coordination and gait.  Assessment/Plan Syncope Community acquired pneumonia Acute recurrent adrenal insufficiency Severe hypokalemia CAD S/P Coronary stents HTN HLD S/P pituitary adenoma  Dehydration  Plan: Continue IV fluids and potassium supplement. Awaiting VQ scan to r/o PE Continue oxygen by [place]. Echocardiogram for LV function for syncope.  Time spent: Review of old records, Lab, x-rays, EKG, other cardiac tests, examination, discussion with patient/Nurse/ER doctor over 70 minutes.  Ricki Rodriguez, MD  07/30/2023, 4:34 PM

## 2023-07-30 NOTE — ED Notes (Signed)
Pt was stuck twice. Wasn't successful.

## 2023-07-30 NOTE — ED Notes (Signed)
Potassium 2.5 called as critical! Dahlia Client RN notified

## 2023-07-31 ENCOUNTER — Encounter (HOSPITAL_COMMUNITY): Payer: Self-pay | Admitting: Cardiovascular Disease

## 2023-07-31 ENCOUNTER — Inpatient Hospital Stay (HOSPITAL_COMMUNITY): Payer: Medicare PPO

## 2023-07-31 LAB — CBC
HCT: 38.8 % (ref 36.0–46.0)
Hemoglobin: 12.9 g/dL (ref 12.0–15.0)
MCH: 32.2 pg (ref 26.0–34.0)
MCHC: 33.2 g/dL (ref 30.0–36.0)
MCV: 96.8 fL (ref 80.0–100.0)
Platelets: 227 10*3/uL (ref 150–400)
RBC: 4.01 MIL/uL (ref 3.87–5.11)
RDW: 12.7 % (ref 11.5–15.5)
WBC: 4.6 10*3/uL (ref 4.0–10.5)
nRBC: 0 % (ref 0.0–0.2)

## 2023-07-31 LAB — BASIC METABOLIC PANEL
Anion gap: 11 (ref 5–15)
BUN: 13 mg/dL (ref 8–23)
CO2: 24 mmol/L (ref 22–32)
Calcium: 9.1 mg/dL (ref 8.9–10.3)
Chloride: 106 mmol/L (ref 98–111)
Creatinine, Ser: 1.56 mg/dL — ABNORMAL HIGH (ref 0.44–1.00)
GFR, Estimated: 32 mL/min — ABNORMAL LOW (ref >60)
Glucose, Bld: 154 mg/dL — ABNORMAL HIGH (ref 70–99)
Potassium: 3.5 mmol/L (ref 3.5–5.1)
Sodium: 141 mmol/L (ref 135–145)

## 2023-07-31 LAB — ECHOCARDIOGRAM COMPLETE
AR max vel: 1.56 cm2
AV Peak grad: 4 mm[Hg]
Ao pk vel: 1 m/s
Area-P 1/2: 3.91 cm2
Height: 66 in
S' Lateral: 3.1 cm
Weight: 2432 [oz_av]

## 2023-07-31 MED ORDER — HYDROCODONE-ACETAMINOPHEN 5-325 MG PO TABS
1.0000 | ORAL_TABLET | Freq: Four times a day (QID) | ORAL | Status: DC | PRN
  Administered 2023-07-31: 1 via ORAL
  Filled 2023-07-31: qty 1

## 2023-07-31 MED ORDER — AZITHROMYCIN 250 MG PO TABS
250.0000 mg | ORAL_TABLET | Freq: Every day | ORAL | Status: DC
  Administered 2023-08-01: 250 mg via ORAL
  Filled 2023-07-31: qty 1

## 2023-07-31 MED ORDER — HYDROCORTISONE 10 MG PO TABS: 10.0000 mg | ORAL_TABLET | Freq: Four times a day (QID) | ORAL | Status: DC | PRN

## 2023-07-31 MED ORDER — SODIUM CHLORIDE 0.9 % IV SOLN
1.0000 g | Freq: Every day | INTRAVENOUS | Status: DC
  Administered 2023-08-01: 1 g via INTRAVENOUS
  Filled 2023-07-31: qty 10

## 2023-07-31 MED ORDER — HYDROCORTISONE 10 MG PO TABS
10.0000 mg | ORAL_TABLET | Freq: Every day | ORAL | Status: DC
  Administered 2023-07-31: 10 mg via ORAL
  Filled 2023-07-31 (×2): qty 1

## 2023-07-31 MED ORDER — POTASSIUM CHLORIDE ER 10 MEQ PO TBCR
10.0000 meq | EXTENDED_RELEASE_TABLET | Freq: Two times a day (BID) | ORAL | Status: DC
Start: 2023-07-31 — End: 2023-08-01
  Administered 2023-07-31 (×2): 10 meq via ORAL
  Filled 2023-07-31 (×6): qty 1

## 2023-07-31 NOTE — Progress Notes (Signed)
Echocardiogram 2D Echocardiogram has been performed.  Adriana Jordan 07/31/2023, 2:00 PM

## 2023-07-31 NOTE — ED Notes (Signed)
Echo in process at this time, unable to give meds.

## 2023-07-31 NOTE — ED Notes (Signed)
ED TO INPATIENT HANDOFF REPORT  ED Nurse Name and Phone #: Sherol Sabas, RN [phone]  S Name/Age/Gender Adriana Jordan 88 y.o. female Room/Bed: 042C/042C  Code Status   Code Status: Full Code  Home/SNF/Other Home Patient oriented to: self, place, time, and situation Is this baseline? Yes   Triage Complete: Triage complete  Chief Complaint Syncope [R55]  Triage Note Patient arrived by [facility] after syncopal episode this am. Patient reports that she didn't feel well this am when she awoke and had nausea and dizziness. Ems found initial BP 80s-received 250 NS and BP 100s on arrival. Syncope in chair-did not fall. Patient denies pain, no thinners- Arrived with PIV Cbg 150   Allergies Allergies  Allergen Reactions   Codeine Nausea And Vomiting    Tolerates hydrocodone    Level of Care/Admitting Diagnosis ED Disposition     ED Disposition  Admit   Condition  --   Comment  Hospital Area: [facility] [[id number]]  Level of Care: Telemetry Cardiac [[id number]]  May admit patient to [facility] or [facility] if equivalent level of care is available:: No  Covid Evaluation: Asymptomatic - no recent exposure (last 10 days) testing not required  Diagnosis: Syncope [[id number]]  Admitting Physician: Orpah Cobb [[id number]]  Attending Physician: Orpah Cobb [[id number]]  Certification:: I certify this patient will need inpatient services for at least 2 midnights  Expected Medical Readiness: 08/01/2023          B Medical/Surgery History Past Medical History:  Diagnosis Date   Arthritis    "all over" (11/14/2017)   Brain tumor (HCC)    "I still have it"; not cancer (11/14/2017)   Chronic lower back pain    Coronary artery disease    Dr. Algie Coffer   GERD (gastroesophageal reflux disease)    History of blood transfusion    "don't remember why" (11/14/2017)   History of radiation therapy 07/03/2018   brain, pituitary/ 12.5 Gy in 1 fraction   Hypercholesteremia    Hypertension     MI (myocardial infarction) (HCC) 2006   PONV (postoperative nausea and vomiting)    Skin cancer    "right forearm"   Past Surgical History:  Procedure Laterality Date   ANTERIOR CERVICAL DECOMP/DISCECTOMY FUSION     BACK SURGERY     CARPAL TUNNEL RELEASE Bilateral    CORONARY ANGIOPLASTY     DES mid LAD and mid RCA 09/21/04   CRANIOTOMY N/A 05/21/2015   Procedure: Transsphenoidal resection of pituitary tumor with Dr. Narda Bonds for approach;  Surgeon: Hilda Lias, MD;  [facility] [address] [facility];  Service: Neurosurgery;  Laterality: N/A;  Transsphenoidal resection of pituitary tumor with Dr. Narda Bonds for approach   SHOULDER OPEN ROTATOR CUFF REPAIR Bilateral    SKIN CANCER EXCISION Right    forearm   thumb surgery Right    placed srews to make straight   THYROID SURGERY     "goiter removed"     A IV Location/Drains/Wounds Patient Lines/Drains/Airways Status     Active Line/Drains/Airways     Name Placement date Placement time Site Days   Peripheral IV 07/30/23 18 G Anterior;Left;Upper Arm 07/30/23  1115  Arm  1            Intake/Output Last 24 hours No intake or output data in the 24 hours ending 07/31/23 1210  Labs/Imaging Results for orders placed or performed during the hospital encounter of 07/30/23 (from the past 48 hours)  CBC  Status: None   Collection Time: 07/30/23 11:58 AM  Result Value Ref Range   WBC 4.9 4.0 - 10.5 K/uL   RBC 3.90 3.87 - 5.11 MIL/uL   Hemoglobin 12.9 12.0 - 15.0 g/dL   HCT 13.0 86.5 - 78.4 %   MCV 96.2 80.0 - 100.0 fL   MCH 33.1 26.0 - 34.0 pg   MCHC 34.4 30.0 - 36.0 g/dL   RDW 69.6 29.5 - 28.4 %   Platelets 238 150 - 400 K/uL   nRBC 0.0 0.0 - 0.2 %    Comment: Performed at [facility], [address] [place], [place] [address]  Urinalysis, Routine w reflex microscopic -Urine, Clean Catch     Status: Abnormal   Collection Time: 07/30/23 11:58 AM  Result Value Ref Range   Color, Urine YELLOW YELLOW   APPearance  HAZY (A) CLEAR   Specific Gravity, Urine 1.013 1.005 - 1.030   pH 5.0 5.0 - 8.0   Glucose, UA NEGATIVE NEGATIVE mg/dL   Hgb urine dipstick NEGATIVE NEGATIVE   Bilirubin Urine NEGATIVE NEGATIVE   Ketones, ur NEGATIVE NEGATIVE mg/dL   Protein, ur NEGATIVE NEGATIVE mg/dL   Nitrite NEGATIVE NEGATIVE   Leukocytes,Ua LARGE (A) NEGATIVE   RBC / HPF 0-5 0 - 5 RBC/hpf   WBC, UA >50 0 - 5 WBC/hpf   Bacteria, UA FEW (A) NONE SEEN   Squamous Epithelial / HPF 0-5 0 - 5 /HPF   Mucus PRESENT    Hyaline Casts, UA PRESENT     Comment: Performed at [facility], [address], [place], [place] [address]  Troponin I (High Sensitivity)     Status: None   Collection Time: 07/30/23 11:58 AM  Result Value Ref Range   Troponin I (High Sensitivity) 12 <18 ng/L    Comment: (NOTE) Elevated high sensitivity troponin I (hsTnI) values and significant  changes across serial measurements may suggest ACS but many other  chronic and acute conditions are known to elevate hsTnI results.  Refer to the "Links" section for chest pain algorithms and additional  guidance. Performed at [facility], [address] [place], [place] [address]   Brain natriuretic peptide     Status: None   Collection Time: 07/30/23 11:58 AM  Result Value Ref Range   B Natriuretic Peptide 22.4 0.0 - 100.0 pg/mL    Comment: Performed at [facility], [address] [place], [place] [address]  TSH     Status: None   Collection Time: 07/30/23 11:58 AM  Result Value Ref Range   TSH 4.096 0.350 - 4.500 uIU/mL    Comment: Performed by a 3rd Generation assay with a functional sensitivity of <=0.01 uIU/mL. Performed at [facility], [address], [place], [place] [address]   Lactic acid, plasma     Status: None   Collection Time: 07/30/23 11:58 AM  Result Value Ref Range   Lactic Acid, Venous 1.1 0.5 - 1.9 mmol/L    Comment: Performed at [facility], [address] [place], [place] [address]  I-stat  chem 8, ED (not at [facility] or [facility])     Status: Abnormal   Collection Time: 07/30/23  2:21 PM  Result Value Ref Range   Sodium 143 135 - 145 mmol/L   Potassium 2.5 (LL) 3.5 - 5.1 mmol/L   Chloride 105 98 - 111 mmol/L   BUN 16 8 - 23 mg/dL   Creatinine, Ser  1.70 (H) 0.44 - 1.00 mg/dL   Glucose, Bld 83 70 - 99 mg/dL    Comment: Glucose reference range applies only to samples taken after fasting for at least 8 hours.   Calcium, Ion 1.01 (L) 1.15 - 1.40 mmol/L   TCO2 25 22 - 32 mmol/L   Hemoglobin 13.3 12.0 - 15.0 g/dL   HCT 56.4 33.2 - 95.1 %   Comment NOTIFIED PHYSICIAN   Troponin I (High Sensitivity)     Status: None   Collection Time: 07/30/23  2:30 PM  Result Value Ref Range   Troponin I (High Sensitivity) 14 <18 ng/L    Comment: (NOTE) Elevated high sensitivity troponin I (hsTnI) values and significant  changes across serial measurements may suggest ACS but many other  chronic and acute conditions are known to elevate hsTnI results.  Refer to the "Links" section for chest pain algorithms and additional  guidance. Performed at [facility], [address] [place], [place] [address]   Basic metabolic panel     Status: Abnormal   Collection Time: 07/30/23  2:30 PM  Result Value Ref Range   Sodium 145 135 - 145 mmol/L   Potassium 2.5 (LL) 3.5 - 5.1 mmol/L    Comment: CRITICAL RESULT CALLED TO, READ BACK BY AND VERIFIED WITH Randall Hiss RN , @1535 , 07/30/23, [place], T.   Chloride 101 98 - 111 mmol/L   CO2 26 22 - 32 mmol/L   Glucose, Bld 84 70 - 99 mg/dL    Comment: Glucose reference range applies only to samples taken after fasting for at least 8 hours.   BUN 14 8 - 23 mg/dL   Creatinine, Ser 6.06 (H) 0.44 - 1.00 mg/dL   Calcium 9.0 8.9 - 30.1 mg/dL   GFR, Estimated 29 (L) >60 mL/min    Comment: (NOTE) Calculated using the CKD-EPI Creatinine Equation (2021)    Anion gap 18 (H) 5 - 15    Comment: Performed at [facility], [address] [place], [place]  [address]  D-dimer, quantitative     Status: Abnormal   Collection Time: 07/30/23  2:41 PM  Result Value Ref Range   D-Dimer, Quant 3.22 (H) 0.00 - 0.50 ug/mL-FEU    Comment: (NOTE) At the manufacturer cut-off value of 0.5 g/mL FEU, this assay has a negative predictive value of 95-100%.This assay is intended for use in conjunction with a clinical pretest probability (PTP) assessment model to exclude pulmonary embolism (PE) and deep venous thrombosis (DVT) in outpatients suspected of PE or DVT. Results should be correlated with clinical presentation. Performed at [facility], [address], [place], [place] [address]   Hepatic function panel     Status: Abnormal   Collection Time: 07/30/23  9:08 PM  Result Value Ref Range   Total Protein 5.8 (L) 6.5 - 8.1 g/dL   Albumin 3.3 (L) 3.5 - 5.0 g/dL   AST 32 15 - 41 U/L   ALT 17 0 - 44 U/L   Alkaline Phosphatase 57 38 - 126 U/L   Total Bilirubin 0.8 <1.2 mg/dL   Bilirubin, Direct 0.2 0.0 - 0.2 mg/dL   Indirect Bilirubin 0.6 0.3 - 0.9 mg/dL    Comment: Performed at [facility], [address], [place], [place] [address]  Magnesium     Status: None   Collection Time: 07/30/23  9:08 PM  Result Value Ref Range   Magnesium 1.9 1.7 - 2.4 mg/dL  Comment: Performed at [facility], [address], [place], [place] [address]  CBG monitoring, ED     Status: Abnormal   Collection Time: 07/30/23 11:38 PM  Result Value Ref Range   Glucose-Capillary 181 (H) 70 - 99 mg/dL    Comment: Glucose reference range applies only to samples taken after fasting for at least 8 hours.  Basic metabolic panel     Status: Abnormal   Collection Time: 07/31/23  3:40 AM  Result Value Ref Range   Sodium 141 135 - 145 mmol/L   Potassium 3.5 3.5 - 5.1 mmol/L   Chloride 106 98 - 111 mmol/L   CO2 24 22 - 32 mmol/L   Glucose, Bld 154 (H) 70 - 99 mg/dL    Comment: Glucose reference range applies only to samples taken after fasting for at least 8  hours.   BUN 13 8 - 23 mg/dL   Creatinine, Ser 4.13 (H) 0.44 - 1.00 mg/dL   Calcium 9.1 8.9 - 24.4 mg/dL   GFR, Estimated 32 (L) >60 mL/min    Comment: (NOTE) Calculated using the CKD-EPI Creatinine Equation (2021)    Anion gap 11 5 - 15    Comment: Performed at [facility], [address], [place], [place] [address]  CBC     Status: None   Collection Time: 07/31/23  3:40 AM  Result Value Ref Range   WBC 4.6 4.0 - 10.5 K/uL   RBC 4.01 3.87 - 5.11 MIL/uL   Hemoglobin 12.9 12.0 - 15.0 g/dL   HCT 25.3 66.4 - 40.3 %   MCV 96.8 80.0 - 100.0 fL   MCH 32.2 26.0 - 34.0 pg   MCHC 33.2 30.0 - 36.0 g/dL   RDW 47.4 25.9 - 56.3 %   Platelets 227 150 - 400 K/uL   nRBC 0.0 0.0 - 0.2 %    Comment: Performed at [facility], [address], [place], [place] [address]   CT Chest Wo Contrast Result Date: 07/30/2023 CLINICAL DATA:  Respiratory illness, syncope this morning EXAM: CT CHEST WITHOUT CONTRAST TECHNIQUE: Multidetector CT imaging of the chest was performed following the standard protocol without IV contrast. RADIATION DOSE REDUCTION: This exam was performed according to the departmental dose-optimization program which includes automated exposure control, adjustment of the mA and/or kV according to patient size and/or use of iterative reconstruction technique. COMPARISON:  02/14/2023 FINDINGS: Cardiovascular: Aortic atherosclerosis. Aortic valve calcifications. Normal heart size. Three-vessel coronary artery calcifications and stents. No pericardial effusion. Mediastinum/Nodes: No enlarged mediastinal, hilar, or axillary lymph nodes. Small hiatal hernia. Thyroid gland, trachea, and esophagus demonstrate no significant findings. Lungs/Pleura: Mild bibasilar scarring or atelectasis. No acute appearing airspace opacity. No pleural effusion or pneumothorax. Upper Abdomen: No acute abnormality. Musculoskeletal: No chest wall abnormality. No acute osseous findings. IMPRESSION: 1. No acute  noncontrast CT findings of the chest. 2. Mild bibasilar scarring or atelectasis. No acute appearing airspace opacity. 3. Coronary artery disease. 4. Aortic valve calcifications. Correlate for echocardiographic evidence of aortic valve dysfunction. Aortic Atherosclerosis (ICD10-I70.0). Electronically Signed   By: Jearld Lesch M.D.   On: 07/30/2023 15:37   CT HEAD WO CONTRAST Result Date: 07/30/2023 CLINICAL DATA:  Syncope presyncope. EXAM: CT HEAD WITHOUT CONTRAST TECHNIQUE: Contiguous axial images were obtained from the base of the skull through the vertex without intravenous contrast. RADIATION DOSE REDUCTION: This exam was performed according to the departmental dose-optimization program which includes automated exposure control, adjustment of the mA and/or kV  according to patient size and/or use of iterative reconstruction technique. COMPARISON:  06/21/2022 FINDINGS: Brain: There is no evidence for acute hemorrhage, hydrocephalus, mass lesion, or abnormal extra-axial fluid collection. No definite CT evidence for acute infarction. Diffuse loss of parenchymal volume is consistent with atrophy. Patchy low attenuation in the deep hemispheric and periventricular white matter is nonspecific, but likely reflects chronic microvascular ischemic demyelination. Vascular: No hyperdense vessel or unexpected calcification. Skull: No evidence for fracture. No worrisome lytic or sclerotic lesion. Sinuses/Orbits: Stable hypoplastic opacified right sphenoid sinus. The remaining visualized paranasal sinuses and mastoid air cells are clear. Other: None. IMPRESSION: 1. Stable.  No acute intracranial abnormality. 2. Atrophy with chronic small vessel ischemic disease. Electronically Signed   By: Kennith Center M.D.   On: 07/30/2023 11:57   DG Chest Port 1 View Result Date: 07/30/2023 CLINICAL DATA:  Syncope EXAM: PORTABLE CHEST 1 VIEW COMPARISON:  01/30/2023 FINDINGS: Stable cardiomediastinal silhouette. Aortic atherosclerotic  calcification. Similar interstitial prominence most marked in the mid and lower lungs. Similar ground-glass opacities in the lower lungs. No large pleural effusion. No pneumothorax. IMPRESSION: Similar interstitial prominence and ground-glass opacities in the mid and lower lungs. Findings may be due to edema or infection. Electronically Signed   By: Minerva Fester M.D.   On: 07/30/2023 11:40    Pending Labs Unresulted Labs (From admission, onward)    None       Vitals/Pain Today's Vitals   07/31/23 0700 07/31/23 0822 07/31/23 0825 07/31/23 1124  BP: 107/64 125/77  (!) 140/72  Pulse: 79 80  79  Resp:  18  18  Temp:   97.8 F (36.6 C)   TempSrc:   Oral   SpO2: 95% 99%  98%  Weight:      Height:      PainSc:        Isolation Precautions No active isolations  Medications Medications  potassium chloride 10 mEq in 100 mL IVPB (0 mEq Intravenous Hold 07/30/23 2053)  heparin injection 5,000 Units (5,000 Units Subcutaneous Given 07/31/23 0642)  aspirin EC tablet 81 mg (81 mg Oral Given 07/31/23 0947)  albuterol (PROVENTIL) (2.5 MG/3ML) 0.083% nebulizer solution 2.5 mg (has no administration in time range)  HYDROcodone-acetaminophen (NORCO/VICODIN) 5-325 MG per tablet 1 tablet (has no administration in time range)  hydrocortisone (CORTEF) tablet 20 mg (20 mg Oral Given 07/31/23 0644)  hydrocortisone (CORTEF) tablet 10 mg (10 mg Oral Given 07/30/23 1714)  rosuvastatin (CRESTOR) tablet 20 mg (20 mg Oral Given 07/30/23 1830)  spironolactone (ALDACTONE) tablet 12.5 mg (12.5 mg Oral Given 07/31/23 0947)  multivitamin (PROSIGHT) tablet 1 tablet (1 tablet Oral Given 07/31/23 0947)  azithromycin (ZITHROMAX) tablet 250 mg (has no administration in time range)  cefTRIAXone (ROCEPHIN) 1 g in sodium chloride 0.9 % 100 mL IVPB (has no administration in time range)  potassium chloride (KLOR-CON) CR tablet 10 mEq (has no administration in time range)  lactated ringers bolus 500 mL (0 mLs  Intravenous Stopped 07/30/23 1335)  cefTRIAXone (ROCEPHIN) 1 g in sodium chloride 0.9 % 100 mL IVPB (0 g Intravenous Stopped 07/30/23 1251)  azithromycin (ZITHROMAX) 500 mg in sodium chloride 0.9 % 250 mL IVPB (0 mg Intravenous Stopped 07/30/23 1506)  lactated ringers bolus 500 mL (0 mLs Intravenous Stopped 07/30/23 1605)  potassium chloride (KLOR-CON) packet 40 mEq (40 mEq Oral Given 07/30/23 1634)  methylPREDNISolone sodium succinate (SOLU-MEDROL) 125 mg/2 mL injection 125 mg (125 mg Intravenous Given 07/30/23 1645)    Mobility walks with device  Focused Assessments    R Recommendations: See Admitting Provider Note  Report given to:   Additional Notes: Patient is A&Ox4, swallows pills whole, no complaints at this time, K+ has improved and is being transitioned to PO instead of IV.

## 2023-07-31 NOTE — Plan of Care (Signed)

## 2023-07-31 NOTE — Progress Notes (Signed)
Ref: Orpah Cobb, MD   Subjective:  Feeling better. VS stable. BP improved. Patient remembers missing one dose of hydrocortisone 2 days back. She has some cough. No fever or chest pain.  Objective:  Vital Signs in the last 24 hours: Temp:  [97.5 F (36.4 C)-98.3 F (36.8 C)] 97.8 F (36.6 C) (12/22 0825) Pulse Rate:  [63-90] 79 (12/22 1124) Cardiac Rhythm: Normal sinus rhythm (12/21 2203) Resp:  [10-22] 18 (12/22 1124) BP: (100-140)/(56-86) 140/72 (12/22 1124) SpO2:  [88 %-99 %] 98 % (12/22 1124) Weight:  [68.9 kg] 68.9 kg (12/22 0342)  Physical Exam: BP Readings from Last 1 Encounters:  07/31/23 (!) 140/72     Wt Readings from Last 1 Encounters:  07/31/23 68.9 kg    Weight change:  Body mass index is 24.53 kg/m. HEENT: [place]/AT, Eyes-Brown, Conjunctiva-Pink, Sclera-Non-icteric Neck: No JVD, No bruit, Trachea midline. Lungs:  Clear, Bilateral. Cardiac:  Regular rhythm, normal S1 and S2, no S3. II/VI systolic murmur. Abdomen:  Soft, non-tender. BS present. Extremities:  Trace edema present. No cyanosis. No clubbing. CNS: AxOx2, Cranial nerves grossly intact, moves all 4 extremities.  Skin: Warm and dry.   Intake/Output from previous day: No intake/output data recorded.    Lab Results: BMET    Component Value Date/Time   NA 141 07/31/2023 0340   NA 145 07/30/2023 1430   NA 143 07/30/2023 1421   K 3.5 07/31/2023 0340   K 2.5 (LL) 07/30/2023 1430   K 2.5 (LL) 07/30/2023 1421   CL 106 07/31/2023 0340   CL 101 07/30/2023 1430   CL 105 07/30/2023 1421   CO2 24 07/31/2023 0340   CO2 26 07/30/2023 1430   CO2 23 02/20/2023 0228   GLUCOSE 154 (H) 07/31/2023 0340   GLUCOSE 84 07/30/2023 1430   GLUCOSE 83 07/30/2023 1421   BUN 13 07/31/2023 0340   BUN 14 07/30/2023 1430   BUN 16 07/30/2023 1421   CREATININE 1.56 (H) 07/31/2023 0340   CREATININE 1.69 (H) 07/30/2023 1430   CREATININE 1.70 (H) 07/30/2023 1421   CALCIUM 9.1 07/31/2023 0340   CALCIUM 9.0 07/30/2023  1430   CALCIUM 8.9 02/20/2023 0228   GFRNONAA 32 (L) 07/31/2023 0340   GFRNONAA 29 (L) 07/30/2023 1430   GFRNONAA 45 (L) 02/20/2023 0228   GFRAA 46 (L) 11/15/2017 0439   GFRAA 40 (L) 11/14/2017 1250   GFRAA 56 (L) 11/09/2017 1341   CBC    Component Value Date/Time   WBC 4.6 07/31/2023 0340   RBC 4.01 07/31/2023 0340   HGB 12.9 07/31/2023 0340   HCT 38.8 07/31/2023 0340   PLT 227 07/31/2023 0340   MCV 96.8 07/31/2023 0340   MCH 32.2 07/31/2023 0340   MCHC 33.2 07/31/2023 0340   RDW 12.7 07/31/2023 0340   LYMPHSABS 1.2 01/30/2023 1112   MONOABS 0.3 01/30/2023 1112   EOSABS 0.1 01/30/2023 1112   BASOSABS 0.0 01/30/2023 1112   HEPATIC Function Panel Recent Labs    01/30/23 1112 02/19/23 0436 07/30/23 2108  PROT 6.4* 5.7* 5.8*  ALBUMIN 3.4* 2.8* 3.3*  AST 35 23 32  ALT 15 13 17   ALKPHOS 56 43 57  BILIDIR  --   --  0.2  IBILI  --   --  0.6   HEMOGLOBIN A1C Lab Results  Component Value Date   MPG 131.24 02/19/2023   CARDIAC ENZYMES Lab Results  Component Value Date   CKTOTAL 284 (H) 01/30/2023   BNP No results for input(s): "PROBNP" in the  last 8760 hours. TSH Recent Labs    02/19/23 0436 07/30/23 1158  TSH 3.198 4.096   CHOLESTEROL No results for input(s): "CHOL" in the last 8760 hours.  Scheduled Meds:  aspirin EC  81 mg Oral Daily   azithromycin  250 mg Oral Daily   heparin  5,000 Units Subcutaneous Q8H   hydrocortisone  10 mg Oral QPM   hydrocortisone  20 mg Oral [id number]   multivitamin  1 tablet Oral Daily   potassium chloride  10 mEq Oral BID   rosuvastatin  20 mg Oral QPM   spironolactone  12.5 mg Oral Daily   Continuous Infusions:  cefTRIAXone (ROCEPHIN)  IV     PRN Meds:.albuterol, HYDROcodone-acetaminophen  Assessment/Plan: Syncope Community acquired pneumonia Acute recurrent adrenal insufficiency Severe hypokalemia, resolving CAD S/P Coronary stents HTN HLD S/P pituitary adenoma  Dehydration   Plan: Continue potassium  supplement. Increase activity. Awaiting echocardiogram.   LOS: 1 day   Time spent including chart review, lab review, examination, discussion with patient/Nurse/Tech : 30 min   Orpah Cobb  MD  07/31/2023, 12:02 PM

## 2023-08-01 ENCOUNTER — Inpatient Hospital Stay (HOSPITAL_COMMUNITY): Payer: Medicare PPO

## 2023-08-01 LAB — BASIC METABOLIC PANEL
Anion gap: 9 (ref 5–15)
BUN: 17 mg/dL (ref 8–23)
CO2: 21 mmol/L — ABNORMAL LOW (ref 22–32)
Calcium: 8.9 mg/dL (ref 8.9–10.3)
Chloride: 108 mmol/L (ref 98–111)
Creatinine, Ser: 1.63 mg/dL — ABNORMAL HIGH (ref 0.44–1.00)
GFR, Estimated: 31 mL/min — ABNORMAL LOW (ref >60)
Glucose, Bld: 134 mg/dL — ABNORMAL HIGH (ref 70–99)
Potassium: 3.9 mmol/L (ref 3.5–5.1)
Sodium: 138 mmol/L (ref 135–145)

## 2023-08-01 MED ORDER — AZITHROMYCIN 250 MG PO TABS: ORAL_TABLET | ORAL | 0 refills | Status: DC

## 2023-08-01 MED ORDER — HYDROCODONE-ACETAMINOPHEN 5-325 MG PO TABS: 1.0000 | ORAL_TABLET | Freq: Four times a day (QID) | ORAL | Status: DC | PRN

## 2023-08-01 MED ORDER — TECHNETIUM TO 99M ALBUMIN AGGREGATED
4.1000 | Freq: Once | INTRAVENOUS | Status: AC | PRN
  Administered 2023-08-01: 4.1 via INTRAVENOUS

## 2023-08-01 MED ORDER — ASPIRIN 81 MG PO TBEC: 81.0000 mg | DELAYED_RELEASE_TABLET | Freq: Every day | ORAL | 12 refills | Status: DC

## 2023-08-01 MED ORDER — POTASSIUM CHLORIDE CRYS ER 10 MEQ PO TBCR
10.0000 meq | EXTENDED_RELEASE_TABLET | Freq: Every day | ORAL | Status: DC
  Administered 2023-08-01: 10 meq via ORAL

## 2023-08-01 NOTE — Discharge Summary (Signed)
Physician Discharge Summary  Patient ID: Adriana Jordan MRN: [id number] DOB/AGE: [id number] y.o.  Admit date: 07/30/2023 Discharge date: 08/01/2023  Admission Diagnoses: Syncope Community acquired pneumonia Acute recurrent adrenal insufficiency Severe hypokalemia CAD S/P Coronary stents HTN HLD S/P pituitary adenoma  Dehydration  Discharge Diagnoses:  Principal Problem:   Syncope Active problems: Community acquired pneumonia Acute recurrent adrenal insufficiency Severe hypokalemia, resolved CAD S/P Coronary stents HTN HLD S/P pituitary adenoma  Dehydration, improving  Discharged Condition: fair  Hospital Course: 88 years old black female with PMH of Adrenal insufficiency, pituitary adenoma- s/p surgery and radiation Tx, HTN, HLD and CAD had episode of weakness and hypotension. In ER she was also hypoxic. Chest x-ray and CT chest without contrast shows atelectasis and small possibility of pneumonia. She claims to take her her medications mostly Cortef regularly. She was hypotensive on arrival to ED. Post fluid resuscitation her BP has improved. She has significant hypokalemia. Her CBC, TSH, Troponin I, BNP and lactic acid levels are normal. She denies chest pain or palpitation. EKG shows sinus rhythm with antero-lateral infarct and possible LVH. Echocardiogram showed preserved LV systolic function and mild MR. She improved with solumedrol 125 mg IV x one and IV rocephin and PO azithromycin use. Her oxygen saturation also improved to normal. Her VQ scan was negative for PE. Telemetry remained stable. She will continue azithromycin for 3 more days and see me in 1 week.  Consults: cardiology  Significant Diagnostic Studies: labs: CBC was normal. BMET was near normal except low potassium of 2.5 meq. Improving to 3.5 meq with supplementation. BNP, Troponin I, Lactic acid and TSH were normal. D-dimer was elevated but VQ scan was normal.  EKG was sinus rhythm with  anterolateral infarct.  Echocardiogram was near normal with mild MR.  VQ scan was normal.  CXR/ CT scan of chest were unremarkable with possible mid and lower lungs infection v/s atelectasis.  Treatments: antibiotics: ceftriaxone and azithromycin  Discharge Exam: Blood pressure 130/79, pulse 77, temperature 97.6 F (36.4 C), temperature source Oral, resp. rate 20, height 5\' 6"  (1.676 m), weight 74.3 kg, SpO2 95%. General appearance: alert, cooperative and appears stated age. Head: Normocephalic, atraumatic. Eyes: Brown eyes, pink conjunctiva, corneas clear.   Neck: No adenopathy, no carotid bruit, no JVD, supple, symmetrical, trachea midline and thyroid not enlarged. Resp: Clear to auscultation bilaterally. Cardio: Regular rate and rhythm, S1, S2 normal, II/VI systolic murmur, no click, rub or gallop. GI: Soft, non-tender; bowel sounds normal; no organomegaly. Extremities: Trace edema, no cyanosis or clubbing. Skin: Warm and dry.  Neurologic: Alert and oriented X 3, normal strength and tone. Normal coordination and gait.  Disposition: Discharge disposition: 01-Home or Self Care        Allergies as of 08/01/2023       Reactions   Codeine Nausea And Vomiting   Tolerates hydrocodone        Medication List     STOP taking these medications    amLODipine 5 MG tablet Commonly known as: NORVASC   oxyCODONE 5 MG immediate release tablet Commonly known as: Oxy IR/ROXICODONE       TAKE these medications    aspirin EC 81 MG tablet Take 1 tablet (81 mg total) by mouth daily. Swallow whole. Start taking on: August 02, 2023   azithromycin 250 MG tablet Commonly known as: ZITHROMAX Take 1 tablet daily x 3 days Start taking on: August 02, 2023   feeding supplement Liqd Take 237 mLs by mouth 2 (two)  times daily between meals. What changed:  when to take this reasons to take this   HYDROcodone-acetaminophen 5-325 MG tablet Commonly known as:  NORCO/VICODIN Take 1 tablet by mouth every 6 (six) hours as needed for moderate pain (pain score 4-6).   hydrocortisone 20 MG tablet Commonly known as: CORTEF Take 1 tablet (20 mg total) by mouth every morning.   hydrocortisone 10 MG tablet Commonly known as: CORTEF Take 1 tablet (10 mg total) by mouth every evening.   losartan 50 MG tablet Commonly known as: COZAAR Take 50 mg by mouth daily.   pantoprazole 40 MG tablet Commonly known as: PROTONIX Take 40 mg by mouth daily as needed (heartburn).   potassium chloride 10 MEQ tablet Commonly known as: KLOR-CON Take 10 mEq by mouth daily.   PreserVision AREDS 2 Caps Take 1 capsule by mouth 2 (two) times daily.   rosuvastatin 20 MG tablet Commonly known as: CRESTOR Take 20 mg by mouth every evening.   spironolactone 25 MG tablet Commonly known as: ALDACTONE Take 0.5 tablets (12.5 mg total) by mouth daily.   torsemide 10 MG tablet Commonly known as: DEMADEX Take 10 mg by mouth daily as needed (fluid, swelling).        Follow-up Information     Orpah Cobb, MD Follow up in 1 week(s).   Specialty: Cardiology Contact information: [address] [place] [address] [phone]                 Time spent: Review of old chart, current chart, lab, x-ray, cardiac tests and discussion with patient over 60 minutes.  Signed: Ricki Rodriguez 08/01/2023, 12:43 PM

## 2023-08-16 ENCOUNTER — Other Ambulatory Visit: Payer: Self-pay | Admitting: Internal Medicine

## 2023-08-16 DIAGNOSIS — D352 Benign neoplasm of pituitary gland: Secondary | ICD-10-CM

## 2023-08-22 ENCOUNTER — Other Ambulatory Visit: Payer: Self-pay | Admitting: *Deleted

## 2023-08-22 DIAGNOSIS — D352 Benign neoplasm of pituitary gland: Secondary | ICD-10-CM

## 2023-09-08 ENCOUNTER — Telehealth: Payer: Self-pay | Admitting: Internal Medicine

## 2023-09-08 ENCOUNTER — Telehealth: Payer: Self-pay | Admitting: *Deleted

## 2023-09-08 NOTE — Telephone Encounter (Signed)
Called [facility] to schedule patient's appointments.

## 2023-09-08 NOTE — Telephone Encounter (Signed)
PC to patient's son, Delila Pereyra - informed him patient's brain MRI is scheduled for 10/03/23 at 9:00 am at [facility], she needs to arrive at 8:30.  Our scheduling department will be contacting them to arrange F/U visit with Dr Barbaraann Cao a few days after the MRI.  He verbalizes understanding, scheduling message sent.

## 2023-09-12 ENCOUNTER — Other Ambulatory Visit: Payer: Self-pay

## 2023-09-12 ENCOUNTER — Observation Stay (HOSPITAL_COMMUNITY)
Admission: EM | Admit: 2023-09-12 | Discharge: 2023-09-14 | Disposition: A | Discharge status: Home / Self Care | Payer: Medicare PPO | Attending: Internal Medicine | Admitting: Internal Medicine

## 2023-09-12 ENCOUNTER — Encounter (HOSPITAL_COMMUNITY): Payer: Self-pay

## 2023-09-12 ENCOUNTER — Observation Stay (HOSPITAL_COMMUNITY): Payer: Medicare PPO

## 2023-09-12 ENCOUNTER — Emergency Department (HOSPITAL_COMMUNITY): Payer: Medicare PPO

## 2023-09-12 DIAGNOSIS — Z79899 Other long term (current) drug therapy: Secondary | ICD-10-CM | POA: Diagnosis not present

## 2023-09-12 DIAGNOSIS — E785 Hyperlipidemia, unspecified: Secondary | ICD-10-CM | POA: Diagnosis not present

## 2023-09-12 DIAGNOSIS — R55 Syncope and collapse: Secondary | ICD-10-CM | POA: Diagnosis present

## 2023-09-12 DIAGNOSIS — Z87891 Personal history of nicotine dependence: Secondary | ICD-10-CM | POA: Diagnosis not present

## 2023-09-12 DIAGNOSIS — Z9861 Coronary angioplasty status: Secondary | ICD-10-CM | POA: Insufficient documentation

## 2023-09-12 DIAGNOSIS — J189 Pneumonia, unspecified organism: Secondary | ICD-10-CM | POA: Diagnosis not present

## 2023-09-12 DIAGNOSIS — E271 Primary adrenocortical insufficiency: Secondary | ICD-10-CM | POA: Insufficient documentation

## 2023-09-12 DIAGNOSIS — U071 COVID-19: Secondary | ICD-10-CM | POA: Diagnosis not present

## 2023-09-12 DIAGNOSIS — E2740 Unspecified adrenocortical insufficiency: Secondary | ICD-10-CM | POA: Diagnosis not present

## 2023-09-12 DIAGNOSIS — K219 Gastro-esophageal reflux disease without esophagitis: Secondary | ICD-10-CM | POA: Insufficient documentation

## 2023-09-12 DIAGNOSIS — Z7901 Long term (current) use of anticoagulants: Secondary | ICD-10-CM | POA: Insufficient documentation

## 2023-09-12 DIAGNOSIS — I129 Hypertensive chronic kidney disease with stage 1 through stage 4 chronic kidney disease, or unspecified chronic kidney disease: Secondary | ICD-10-CM | POA: Diagnosis not present

## 2023-09-12 DIAGNOSIS — I959 Hypotension, unspecified: Secondary | ICD-10-CM | POA: Diagnosis not present

## 2023-09-12 DIAGNOSIS — Z8673 Personal history of transient ischemic attack (TIA), and cerebral infarction without residual deficits: Secondary | ICD-10-CM | POA: Insufficient documentation

## 2023-09-12 DIAGNOSIS — D500 Iron deficiency anemia secondary to blood loss (chronic): Secondary | ICD-10-CM | POA: Diagnosis not present

## 2023-09-12 DIAGNOSIS — Z9181 History of falling: Secondary | ICD-10-CM | POA: Insufficient documentation

## 2023-09-12 DIAGNOSIS — N189 Chronic kidney disease, unspecified: Secondary | ICD-10-CM | POA: Diagnosis not present

## 2023-09-12 DIAGNOSIS — I2510 Atherosclerotic heart disease of native coronary artery without angina pectoris: Secondary | ICD-10-CM

## 2023-09-12 DIAGNOSIS — D352 Benign neoplasm of pituitary gland: Secondary | ICD-10-CM | POA: Diagnosis not present

## 2023-09-12 DIAGNOSIS — J9601 Acute respiratory failure with hypoxia: Secondary | ICD-10-CM | POA: Diagnosis not present

## 2023-09-12 DIAGNOSIS — R262 Difficulty in walking, not elsewhere classified: Secondary | ICD-10-CM | POA: Diagnosis not present

## 2023-09-12 DIAGNOSIS — I10 Essential (primary) hypertension: Secondary | ICD-10-CM | POA: Diagnosis present

## 2023-09-12 DIAGNOSIS — M6281 Muscle weakness (generalized): Secondary | ICD-10-CM | POA: Diagnosis not present

## 2023-09-12 DIAGNOSIS — R7989 Other specified abnormal findings of blood chemistry: Secondary | ICD-10-CM | POA: Insufficient documentation

## 2023-09-12 LAB — APTT: aPTT: 33 s (ref 24–36)

## 2023-09-12 LAB — CBC WITH DIFFERENTIAL/PLATELET
Abs Immature Granulocytes: 0.02 10*3/uL (ref 0.00–0.07)
Basophils Absolute: 0 10*3/uL (ref 0.0–0.1)
Basophils Relative: 1 %
Eosinophils Absolute: 0.1 10*3/uL (ref 0.0–0.5)
Eosinophils Relative: 1 %
HCT: 42.9 % (ref 36.0–46.0)
Hemoglobin: 14.3 g/dL (ref 12.0–15.0)
Immature Granulocytes: 0 %
Lymphocytes Relative: 26 %
Lymphs Abs: 2.1 10*3/uL (ref 0.7–4.0)
MCH: 31.6 pg (ref 26.0–34.0)
MCHC: 33.3 g/dL (ref 30.0–36.0)
MCV: 94.9 fL (ref 80.0–100.0)
Monocytes Absolute: 0.8 10*3/uL (ref 0.1–1.0)
Monocytes Relative: 10 %
Neutro Abs: 5.1 10*3/uL (ref 1.7–7.7)
Neutrophils Relative %: 62 %
Platelets: 266 10*3/uL (ref 150–400)
RBC: 4.52 MIL/uL (ref 3.87–5.11)
RDW: 12.1 % (ref 11.5–15.5)
WBC: 8.1 10*3/uL (ref 4.0–10.5)
nRBC: 0 % (ref 0.0–0.2)

## 2023-09-12 LAB — COMPREHENSIVE METABOLIC PANEL
ALT: 13 U/L (ref 0–44)
ALT: 12 U/L (ref 0–44)
AST: 33 U/L (ref 15–41)
AST: 33 U/L (ref 15–41)
Albumin: 3.4 g/dL — ABNORMAL LOW (ref 3.5–5.0)
Albumin: 3.1 g/dL — ABNORMAL LOW (ref 3.5–5.0)
Alkaline Phosphatase: 51 U/L (ref 38–126)
Alkaline Phosphatase: 47 U/L (ref 38–126)
Anion gap: 13 (ref 5–15)
Anion gap: 13 (ref 5–15)
BUN: 11 mg/dL (ref 8–23)
BUN: 10 mg/dL (ref 8–23)
CO2: 20 mmol/L — ABNORMAL LOW (ref 22–32)
CO2: 20 mmol/L — ABNORMAL LOW (ref 22–32)
Calcium: 8.9 mg/dL (ref 8.9–10.3)
Calcium: 8.7 mg/dL — ABNORMAL LOW (ref 8.9–10.3)
Chloride: 105 mmol/L (ref 98–111)
Chloride: 107 mmol/L (ref 98–111)
Creatinine, Ser: 1.74 mg/dL — ABNORMAL HIGH (ref 0.44–1.00)
Creatinine, Ser: 1.73 mg/dL — ABNORMAL HIGH (ref 0.44–1.00)
GFR, Estimated: 28 mL/min — ABNORMAL LOW (ref >60)
GFR, Estimated: 28 mL/min — ABNORMAL LOW (ref >60)
Glucose, Bld: 101 mg/dL — ABNORMAL HIGH (ref 70–99)
Glucose, Bld: 85 mg/dL (ref 70–99)
Potassium: 3.6 mmol/L (ref 3.5–5.1)
Potassium: 3.5 mmol/L (ref 3.5–5.1)
Sodium: 138 mmol/L (ref 135–145)
Sodium: 140 mmol/L (ref 135–145)
Total Bilirubin: 1 mg/dL (ref 0.0–1.2)
Total Bilirubin: 0.8 mg/dL (ref 0.0–1.2)
Total Protein: 6.5 g/dL (ref 6.5–8.1)
Total Protein: 5.9 g/dL — ABNORMAL LOW (ref 6.5–8.1)

## 2023-09-12 LAB — URINALYSIS, W/ REFLEX TO CULTURE (INFECTION SUSPECTED)
Bacteria, UA: NONE SEEN
Bilirubin Urine: NEGATIVE
Glucose, UA: NEGATIVE mg/dL
Hgb urine dipstick: NEGATIVE
Ketones, ur: NEGATIVE mg/dL
Leukocytes,Ua: NEGATIVE
Nitrite: NEGATIVE
Protein, ur: NEGATIVE mg/dL
Specific Gravity, Urine: 1.013 (ref 1.005–1.030)
pH: 6 (ref 5.0–8.0)

## 2023-09-12 LAB — TSH: TSH: 3.215 u[IU]/mL (ref 0.350–4.500)

## 2023-09-12 LAB — PROTIME-INR
INR: 1.1 (ref 0.8–1.2)
Prothrombin Time: 14.7 s (ref 11.4–15.2)

## 2023-09-12 LAB — TROPONIN I (HIGH SENSITIVITY)
Troponin I (High Sensitivity): 15 ng/L (ref <18)
Troponin I (High Sensitivity): 16 ng/L (ref <18)

## 2023-09-12 LAB — RESP PANEL BY RT-PCR (RSV, FLU A&B, COVID)  RVPGX2
Influenza A by PCR: NEGATIVE
Influenza B by PCR: NEGATIVE
Resp Syncytial Virus by PCR: NEGATIVE
SARS Coronavirus 2 by RT PCR: POSITIVE — AB

## 2023-09-12 LAB — CBG MONITORING, ED: Glucose-Capillary: 102 mg/dL — ABNORMAL HIGH (ref 70–99)

## 2023-09-12 LAB — PROCALCITONIN: Procalcitonin: 0.64 ng/mL

## 2023-09-12 LAB — D-DIMER, QUANTITATIVE: D-Dimer, Quant: 7.45 ug{FEU}/mL — ABNORMAL HIGH (ref 0.00–0.50)

## 2023-09-12 MED ORDER — HYDROCORTISONE 10 MG PO TABS
10.0000 mg | ORAL_TABLET | Freq: Every evening | ORAL | Status: DC
  Administered 2023-09-13: 10 mg via ORAL
  Filled 2023-09-12 (×2): qty 1

## 2023-09-12 MED ORDER — ENOXAPARIN SODIUM 30 MG/0.3ML IJ SOSY
30.0000 mg | PREFILLED_SYRINGE | INTRAMUSCULAR | Status: DC
  Administered 2023-09-12 – 2023-09-13 (×2): 30 mg via SUBCUTANEOUS
  Filled 2023-09-12 (×2): qty 0.3

## 2023-09-12 MED ORDER — POLYETHYLENE GLYCOL 3350 17 G PO PACK: 17.0000 g | PACK | Freq: Every day | ORAL | Status: DC | PRN

## 2023-09-12 MED ORDER — TECHNETIUM TO 99M ALBUMIN AGGREGATED
4.1000 | Freq: Once | INTRAVENOUS | Status: AC | PRN
  Administered 2023-09-12: 4.1 via INTRAVENOUS

## 2023-09-12 MED ORDER — SODIUM CHLORIDE 0.9 % IV SOLN
500.0000 mg | Freq: Once | INTRAVENOUS | Status: AC
  Administered 2023-09-12: 500 mg via INTRAVENOUS
  Filled 2023-09-12: qty 5

## 2023-09-12 MED ORDER — SODIUM CHLORIDE 0.9% FLUSH
3.0000 mL | Freq: Two times a day (BID) | INTRAVENOUS | Status: DC
  Administered 2023-09-12 – 2023-09-14 (×5): 3 mL via INTRAVENOUS

## 2023-09-12 MED ORDER — ROSUVASTATIN CALCIUM 20 MG PO TABS
20.0000 mg | ORAL_TABLET | Freq: Every evening | ORAL | Status: DC
  Administered 2023-09-12 – 2023-09-13 (×2): 20 mg via ORAL
  Filled 2023-09-12 (×2): qty 1

## 2023-09-12 MED ORDER — HYDROCORTISONE SOD SUC (PF) 100 MG IJ SOLR
100.0000 mg | Freq: Once | INTRAMUSCULAR | Status: AC
  Administered 2023-09-12: 100 mg via INTRAVENOUS
  Filled 2023-09-12: qty 2

## 2023-09-12 MED ORDER — ACETAMINOPHEN 325 MG PO TABS
650.0000 mg | ORAL_TABLET | Freq: Four times a day (QID) | ORAL | Status: DC | PRN
  Administered 2023-09-12: 650 mg via ORAL
  Filled 2023-09-12: qty 2

## 2023-09-12 MED ORDER — SODIUM CHLORIDE 0.9 % IV BOLUS
1000.0000 mL | Freq: Once | INTRAVENOUS | Status: AC
  Administered 2023-09-12: 1000 mL via INTRAVENOUS

## 2023-09-12 MED ORDER — SODIUM CHLORIDE 0.9 % IV BOLUS (SEPSIS)
1000.0000 mL | Freq: Once | INTRAVENOUS | Status: AC
  Administered 2023-09-12: 1000 mL via INTRAVENOUS

## 2023-09-12 MED ORDER — ASPIRIN 81 MG PO TBEC
81.0000 mg | DELAYED_RELEASE_TABLET | Freq: Every day | ORAL | Status: DC
  Administered 2023-09-13 – 2023-09-14 (×2): 81 mg via ORAL
  Filled 2023-09-12 (×2): qty 1

## 2023-09-12 MED ORDER — HYDROCORTISONE 20 MG PO TABS
20.0000 mg | ORAL_TABLET | ORAL | Status: DC
  Administered 2023-09-13 – 2023-09-14 (×2): 20 mg via ORAL
  Filled 2023-09-12 (×2): qty 1

## 2023-09-12 MED ORDER — ENSURE ENLIVE PO LIQD: 237.0000 mL | Freq: Two times a day (BID) | ORAL | Status: DC | PRN

## 2023-09-12 MED ORDER — SODIUM CHLORIDE 0.9 % IV SOLN
1.0000 g | Freq: Once | INTRAVENOUS | Status: AC
  Administered 2023-09-12: 1 g via INTRAVENOUS
  Filled 2023-09-12: qty 10

## 2023-09-12 MED ORDER — PANTOPRAZOLE SODIUM 40 MG PO TBEC
40.0000 mg | DELAYED_RELEASE_TABLET | Freq: Every day | ORAL | Status: DC
  Administered 2023-09-13 – 2023-09-14 (×2): 40 mg via ORAL
  Filled 2023-09-12 (×2): qty 1

## 2023-09-12 MED ORDER — ACETAMINOPHEN 650 MG RE SUPP: 650.0000 mg | Freq: Four times a day (QID) | RECTAL | Status: DC | PRN

## 2023-09-12 NOTE — ED Notes (Signed)
Patient left the floor AOX4, in stable condition with staff.

## 2023-09-12 NOTE — ED Notes (Signed)
ED TO INPATIENT HANDOFF REPORT  ED Nurse Name and Phone #: Angelica Chessman, [phone]  S Name/Age/Gender Adriana Jordan 89 y.o. female Room/Bed: 041C/041C  Code Status   Code Status: Full Code  Home/SNF/Other Home Patient oriented to: self, place, time, and situation Is this baseline? Yes   Triage Complete: Triage complete  Chief Complaint Near syncope [R55]  Triage Note Pt to ED via [facility] from home. Pt had a syncopal episode at home. Did not fall, was in a chair. No recent illness.  Pt is usually walking around\, A&Ox4 at home per family. Pt has been lethargic and SHOB per EMS.   EMS VS 81/52 90 94% NRB 15L 70s when sleeping   Allergies Allergies  Allergen Reactions   Codeine Nausea And Vomiting    Tolerates hydrocodone    Level of Care/Admitting Diagnosis ED Disposition     ED Disposition  Admit   Condition  --   Comment  Hospital Area: [facility] [[id number]]  Level of Care: Telemetry Medical [104]  May place patient in observation at [facility] or [facility] if equivalent level of care is available:: No  Covid Evaluation: Asymptomatic - no recent exposure (last 10 days) testing not required  Diagnosis: Near syncope [phone]  Admitting Physician: Synetta Fail [[id number]]  Attending Physician: Synetta Fail [[id number]]          B Medical/Surgery History Past Medical History:  Diagnosis Date   Acute respiratory failure with hypoxia (HCC) 11/15/2017   AKI (acute kidney injury) (HCC) 11/15/2017   Arthritis    "all over" (11/14/2017)   Brain tumor (HCC)    "I still have it"; not cancer (11/14/2017)   Chest pain, atypical 09/11/2021   Chronic lower back pain    Community acquired pneumonia 02/14/2023   Coronary artery disease    Dr. Algie Coffer   Elevated CK 11/15/2017   GERD (gastroesophageal reflux disease)    History of blood transfusion    "don't remember why" (11/14/2017)   History of radiation therapy 07/03/2018   brain,  pituitary/ 12.5 Gy in 1 fraction   Hypercholesteremia    Hypertension    Influenza A 11/14/2017   MI (myocardial infarction) (HCC) 2006   PONV (postoperative nausea and vomiting)    Skin cancer    "right forearm"   Past Surgical History:  Procedure Laterality Date   ANTERIOR CERVICAL DECOMP/DISCECTOMY FUSION     BACK SURGERY     CARPAL TUNNEL RELEASE Bilateral    CORONARY ANGIOPLASTY     DES mid LAD and mid RCA 09/21/04   CRANIOTOMY N/A 05/21/2015   Procedure: Transsphenoidal resection of pituitary tumor with Dr. Narda Bonds for approach;  Surgeon: Hilda Lias, MD;  Location: [facility] NEURO ORS;  Service: Neurosurgery;  Laterality: N/A;  Transsphenoidal resection of pituitary tumor with Dr. Narda Bonds for approach   SHOULDER OPEN ROTATOR CUFF REPAIR Bilateral    SKIN CANCER EXCISION Right    forearm   thumb surgery Right    placed srews to make straight   THYROID SURGERY     "goiter removed"     A IV Location/Drains/Wounds Patient Lines/Drains/Airways Status     Active Line/Drains/Airways     Name Placement date Placement time Site Days   Peripheral IV 09/12/23 18 G Right Antecubital 09/12/23  0857  Antecubital  less than 1   Peripheral IV 09/12/23 20 G Anterior;Left Forearm 09/12/23  0906  Forearm  less than 1  Intake/Output Last 24 hours  Intake/Output Summary (Last 24 hours) at 09/12/2023 1604 Last data filed at 09/12/2023 1610 Gross per 24 hour  Intake --  Output 300 ml  Net -300 ml    Labs/Imaging Results for orders placed or performed during the hospital encounter of 09/12/23 (from the past 48 hours)  CBG monitoring, ED     Status: Abnormal   Collection Time: 09/12/23  8:55 AM  Result Value Ref Range   Glucose-Capillary 102 (H) 70 - 99 mg/dL    Comment: Glucose reference range applies only to samples taken after fasting for at least 8 hours.   Comment 1 Notify RN    Comment 2 Document in Chart   Resp panel by RT-PCR (RSV, Flu A&B, Covid)  Peripheral     Status: Abnormal   Collection Time: 09/12/23  9:00 AM   Specimen: Peripheral; Nasal Swab  Result Value Ref Range   SARS Coronavirus 2 by RT PCR POSITIVE (A) NEGATIVE   Influenza A by PCR NEGATIVE NEGATIVE   Influenza B by PCR NEGATIVE NEGATIVE    Comment: (NOTE) The Xpert Xpress SARS-CoV-2/FLU/RSV plus assay is intended as an aid in the diagnosis of influenza from Nasopharyngeal swab specimens and should not be used as a sole basis for treatment. Nasal washings and aspirates are unacceptable for Xpert Xpress SARS-CoV-2/FLU/RSV testing.  Fact Sheet for Patients: BloggerCourse.com  Fact Sheet for Healthcare Providers: SeriousBroker.it  This test is not yet approved or cleared by the [place] FDA and has been authorized for detection and/or diagnosis of SARS-CoV-2 by FDA under an Emergency Use Authorization (EUA). This EUA will remain in effect (meaning this test can be used) for the duration of the COVID-19 declaration under Section 564(b)(1) of the Act, 21 U.S.C. section 360bbb-3(b)(1), unless the authorization is terminated or revoked.     Resp Syncytial Virus by PCR NEGATIVE NEGATIVE    Comment: (NOTE) Fact Sheet for Patients: BloggerCourse.com  Fact Sheet for Healthcare Providers: SeriousBroker.it  This test is not yet approved or cleared by the [place] FDA and has been authorized for detection and/or diagnosis of SARS-CoV-2 by FDA under an Emergency Use Authorization (EUA). This EUA will remain in effect (meaning this test can be used) for the duration of the COVID-19 declaration under Section 564(b)(1) of the Act, 21 U.S.C. section 360bbb-3(b)(1), unless the authorization is terminated or revoked.  Performed at [facility], [address] [place], [place] [address]   CBC with Differential     Status: None   Collection Time: 09/12/23   9:10 AM  Result Value Ref Range   WBC 8.1 4.0 - 10.5 K/uL   RBC 4.52 3.87 - 5.11 MIL/uL   Hemoglobin 14.3 12.0 - 15.0 g/dL   HCT 40.9 81.1 - 91.4 %   MCV 94.9 80.0 - 100.0 fL   MCH 31.6 26.0 - 34.0 pg   MCHC 33.3 30.0 - 36.0 g/dL   RDW 78.2 95.6 - 21.3 %   Platelets 266 150 - 400 K/uL   nRBC 0.0 0.0 - 0.2 %   Neutrophils Relative % 62 %   Neutro Abs 5.1 1.7 - 7.7 K/uL   Lymphocytes Relative 26 %   Lymphs Abs 2.1 0.7 - 4.0 K/uL   Monocytes Relative 10 %   Monocytes Absolute 0.8 0.1 - 1.0 K/uL   Eosinophils Relative 1 %   Eosinophils Absolute 0.1 0.0 - 0.5 K/uL   Basophils Relative 1 %   Basophils Absolute 0.0 0.0 -  0.1 K/uL   Immature Granulocytes 0 %   Abs Immature Granulocytes 0.02 0.00 - 0.07 K/uL    Comment: Performed at [facility], [address] [place], [place] [address]  Protime-INR     Status: None   Collection Time: 09/12/23  9:10 AM  Result Value Ref Range   Prothrombin Time 14.7 11.4 - 15.2 seconds   INR 1.1 0.8 - 1.2    Comment: (NOTE) INR goal varies based on device and disease states. Performed at [facility], [address] [place], [place] [address]   APTT     Status: None   Collection Time: 09/12/23  9:10 AM  Result Value Ref Range   aPTT 33 24 - 36 seconds    Comment: SPECIMEN CHECKED FOR CLOTS Performed at [facility], [address], [place], [place] [address]   D-dimer, quantitative     Status: Abnormal   Collection Time: 09/12/23  9:10 AM  Result Value Ref Range   D-Dimer, Quant 7.45 (H) 0.00 - 0.50 ug/mL-FEU    Comment: (NOTE) At the manufacturer cut-off value of 0.5 g/mL FEU, this assay has a negative predictive value of 95-100%.This assay is intended for use in conjunction with a clinical pretest probability (PTP) assessment model to exclude pulmonary embolism (PE) and deep venous thrombosis (DVT) in outpatients suspected of PE or DVT. Results should be correlated with clinical presentation. Performed at [facility], [address], [place], [place] [address]   Troponin I (High Sensitivity)     Status: None   Collection Time: 09/12/23  9:10 AM  Result Value Ref Range   Troponin I (High Sensitivity) 15 <18 ng/L    Comment: (NOTE) Elevated high sensitivity troponin I (hsTnI) values and significant  changes across serial measurements may suggest ACS but many other  chronic and acute conditions are known to elevate hsTnI results.  Refer to the "Links" section for chest pain algorithms and additional  guidance. Performed at [facility], [address] [place], [place] [address]   Urinalysis, w/ Reflex to Culture (Infection Suspected) -Urine, Catheterized     Status: None   Collection Time: 09/12/23  9:11 AM  Result Value Ref Range   Specimen Source URINE, CATHETERIZED    Color, Urine YELLOW YELLOW   APPearance CLEAR CLEAR   Specific Gravity, Urine 1.013 1.005 - 1.030   pH 6.0 5.0 - 8.0   Glucose, UA NEGATIVE NEGATIVE mg/dL   Hgb urine dipstick NEGATIVE NEGATIVE   Bilirubin Urine NEGATIVE NEGATIVE   Ketones, ur NEGATIVE NEGATIVE mg/dL   Protein, ur NEGATIVE NEGATIVE mg/dL   Nitrite NEGATIVE NEGATIVE   Leukocytes,Ua NEGATIVE NEGATIVE   RBC / HPF 0-5 0 - 5 RBC/hpf   WBC, UA 0-5 0 - 5 WBC/hpf    Comment:        Reflex urine culture not performed if WBC <=10, OR if Squamous epithelial cells >5. If Squamous epithelial cells >5 suggest recollection.    Bacteria, UA NONE SEEN NONE SEEN   Squamous Epithelial / HPF 0-5 0 - 5 /HPF    Comment: Performed at [facility], [address], [place], [place] [address]  TSH     Status: None   Collection Time: 09/12/23  9:30 AM  Result Value Ref Range   TSH 3.215 0.350 - 4.500 uIU/mL    Comment: Performed by a 3rd Generation assay with a functional sensitivity of <=0.01 uIU/mL. Performed at  [facility], [address], [place], [place] [address]   Comprehensive metabolic panel     Status: Abnormal   Collection Time: 09/12/23  10:30 AM  Result Value Ref Range   Sodium 138 135 - 145 mmol/L   Potassium 3.6 3.5 - 5.1 mmol/L   Chloride 105 98 - 111 mmol/L   CO2 20 (L) 22 - 32 mmol/L   Glucose, Bld 101 (H) 70 - 99 mg/dL    Comment: Glucose reference range applies only to samples taken after fasting for at least 8 hours.   BUN 11 8 - 23 mg/dL   Creatinine, Ser 6.21 (H) 0.44 - 1.00 mg/dL   Calcium 8.9 8.9 - 30.8 mg/dL   Total Protein 6.5 6.5 - 8.1 g/dL   Albumin 3.4 (L) 3.5 - 5.0 g/dL   AST 33 15 - 41 U/L   ALT 13 0 - 44 U/L   Alkaline Phosphatase 51 38 - 126 U/L   Total Bilirubin 1.0 0.0 - 1.2 mg/dL   GFR, Estimated 28 (L) >60 mL/min    Comment: (NOTE) Calculated using the CKD-EPI Creatinine Equation (2021)    Anion gap 13 5 - 15    Comment: Performed at [facility], [address], [place], [place] [address]  Troponin I (High Sensitivity)     Status: None   Collection Time: 09/12/23 10:30 AM  Result Value Ref Range   Troponin I (High Sensitivity) 16 <18 ng/L    Comment: (NOTE) Elevated high sensitivity troponin I (hsTnI) values and significant  changes across serial measurements may suggest ACS but many other  chronic and acute conditions are known to elevate hsTnI results.  Refer to the "Links" section for chest pain algorithms and additional  guidance. Performed at [facility], [address], [place], [place] [address]   Comprehensive metabolic panel     Status: Abnormal   Collection Time: 09/12/23 12:08 PM  Result Value Ref Range   Sodium 140 135 - 145 mmol/L   Potassium 3.5 3.5 - 5.1 mmol/L   Chloride 107 98 - 111 mmol/L   CO2 20 (L) 22 - 32 mmol/L   Glucose, Bld 85 70 - 99 mg/dL    Comment: Glucose reference range applies only to samples taken after fasting for at least 8 hours.   BUN 10 8 - 23 mg/dL   Creatinine, Ser 5.28 (H) 0.44 - 1.00 mg/dL   Calcium 8.7 (L) 8.9 - 10.3 mg/dL   Total Protein 5.9 (L) 6.5 - 8.1 g/dL   Albumin 3.1 (L) 3.5 - 5.0 g/dL   AST 33 15 - 41 U/L    ALT 12 0 - 44 U/L   Alkaline Phosphatase 47 38 - 126 U/L   Total Bilirubin 0.8 0.0 - 1.2 mg/dL   GFR, Estimated 28 (L) >60 mL/min    Comment: (NOTE) Calculated using the CKD-EPI Creatinine Equation (2021)    Anion gap 13 5 - 15    Comment: Performed at [facility], [address] [place], [place] [address]   DG Chest Port 1 View Result Date: 09/12/2023 CLINICAL DATA:  Questionable sepsis EXAM: PORTABLE CHEST 1 VIEW COMPARISON:  None Available. FINDINGS: Normal cardiac silhouette. LEFT basilar atelectasis and small effusion. Upper lungs clear. Anterior cervical fusion IMPRESSION: Small LEFT effusion and basilar atelectasis versus infiltrate. Electronically Signed   By: Genevive Bi M.D.   On: 09/12/2023 10:21    Pending Labs Unresulted Labs (From admission, onward)  Start     Ordered   09/19/23 0500  Creatinine, serum  (enoxaparin (LOVENOX)    CrCl < 30 ml/min)  Once,   R       Comments: while on enoxaparin therapy.    09/12/23 1354   09/13/23 0500  Comprehensive metabolic panel  Tomorrow morning,   R        09/12/23 1354   09/13/23 0500  CBC  Tomorrow morning,   R        09/12/23 1354   09/12/23 1351  Procalcitonin  Once,   R       References:    Procalcitonin Lower Respiratory Tract Infection AND Sepsis Procalcitonin Algorithm   09/12/23 1354   09/12/23 0910  Blood Culture (routine x 2)  (Undifferentiated presentation (screening labs and basic nursing orders))  BLOOD CULTURE X 2,   STAT      09/12/23 0910            Vitals/Pain Today's Vitals   09/12/23 1215 09/12/23 1243 09/12/23 1300 09/12/23 1600  BP: 107/61  (!) 100/58   Pulse: 84  87 85  Resp: 15  14 18   Temp:  97.9 F (36.6 C)    TempSrc:  Temporal    SpO2: 99%  98% 98%  Weight:      Height:      PainSc:        Isolation Precautions No active isolations  Medications Medications  aspirin EC tablet 81 mg (has no administration in time range)  rosuvastatin (CRESTOR) tablet 20 mg (has no  administration in time range)  hydrocortisone (CORTEF) tablet 20 mg (has no administration in time range)  hydrocortisone (CORTEF) tablet 10 mg (has no administration in time range)  pantoprazole (PROTONIX) EC tablet 40 mg (has no administration in time range)  feeding supplement (ENSURE ENLIVE / ENSURE PLUS) liquid 237 mL (has no administration in time range)  sodium chloride flush (NS) 0.9 % injection 3 mL (3 mLs Intravenous Given 09/12/23 1556)  enoxaparin (LOVENOX) injection 30 mg (has no administration in time range)  acetaminophen (TYLENOL) tablet 650 mg (has no administration in time range)    Or  acetaminophen (TYLENOL) suppository 650 mg (has no administration in time range)  polyethylene glycol (MIRALAX / GLYCOLAX) packet 17 g (has no administration in time range)  sodium chloride 0.9 % bolus 1,000 mL (0 mLs Intravenous Stopped 09/12/23 0954)  sodium chloride 0.9 % bolus 1,000 mL (1,000 mLs Intravenous New Bag/Given 09/12/23 0954)  cefTRIAXone (ROCEPHIN) 1 g in sodium chloride 0.9 % 100 mL IVPB (0 g Intravenous Stopped 09/12/23 1112)  azithromycin (ZITHROMAX) 500 mg in sodium chloride 0.9 % 250 mL IVPB (0 mg Intravenous Stopped 09/12/23 1217)  hydrocortisone sodium succinate (SOLU-CORTEF) 100 MG injection 100 mg (100 mg Intravenous Given 09/12/23 1556)  technetium albumin aggregated (MAA) injection solution 4.1 millicurie (4.1 millicuries Intravenous Contrast Given 09/12/23 1551)    Mobility walks with device     Focused Assessments Pulmonary Assessment Handoff:  Lung sounds: Bilateral Breath Sounds: Diminished O2 Device: Nasal Cannula O2 Flow Rate (L/min): 4 L/min    R Recommendations: See Admitting Provider Note  Report given to:   Additional Notes:  Pt is AOX4, walky with cane at home due to a bad knee, does not wear O2 at home, takes meds whole, provider at bedside now

## 2023-09-12 NOTE — ED Triage Notes (Signed)
Pt to ED via [facility] from home. Pt had a syncopal episode at home. Did not fall, was in a chair. No recent illness.  Pt is usually walking around\, A&Ox4 at home per family. Pt has been lethargic and SHOB per EMS.   EMS VS 81/52 90 94% NRB 15L 70s when sleeping

## 2023-09-12 NOTE — H&P (Signed)
History and Physical   Adriana Jordan ZOX:[id number] DOB: 09-Apr-1935 DOA: 09/12/2023  PCP: Orpah Cobb, MD   Patient coming from: Home  Chief Complaint: Near syncope  HPI: Adriana Jordan is a 89 y.o. female with medical history significant of hypertension, hyperlipidemia, GERD, CVA, syncope, CAD, pituitary adenoma, adrenal insufficiency presenting with near syncopal episode.  Patient lives with her son.  He reports she had increased lethargy this morning.  She was in a recliner when he noted she appeared to pass out.  EMS was called and patient was found to be hypotensive and hypoxic and she improved on 15 L.   Of note had a similar episode in December of last year with weakness, hypotension, hypoxia.  Blood pressure improved with IV fluids and patient completed a course of antibiotics for questionable pneumonia and was discharged home.  VQ scan negative at that time.  Denies fevers, chills, chest pain, shortness of breath, abdominal pain, constipation, diarrhea, nausea, vomiting.  ED Course: Vital signs in the ED notable for blood pressure initially 80s now improved to the 100s systolic.  Lab workup included BMP with bicarb 20, creatinine stable 1.74, glucose 1 1.  CBC within normal limits.  PT, PTT, INR within normal limits.  D-dimer elevated to 7.45.  Troponin negative x 2.  TSH normal.  Respiratory panel for flu COVID RSV pending.  Urinalysis normal.  Blood cultures pending.  Chest x-ray with small left effusion and left basilar atelectasis versus infiltrate.  Patient received ceftriaxone, azithromycin, 2 L IV fluids in the ED.  Review of Systems: As per HPI otherwise all other systems reviewed and are negative.  Past Medical History:  Diagnosis Date   Acute respiratory failure with hypoxia (HCC) 11/15/2017   AKI (acute kidney injury) (HCC) 11/15/2017   Arthritis    "all over" (11/14/2017)   Brain tumor (HCC)    "I still have it"; not cancer (11/14/2017)   Chest pain, atypical  09/11/2021   Chronic lower back pain    Community acquired pneumonia 02/14/2023   Coronary artery disease    Dr. Algie Coffer   Elevated CK 11/15/2017   GERD (gastroesophageal reflux disease)    History of blood transfusion    "don't remember why" (11/14/2017)   History of radiation therapy 07/03/2018   brain, pituitary/ 12.5 Gy in 1 fraction   Hypercholesteremia    Hypertension    Influenza A 11/14/2017   MI (myocardial infarction) (HCC) 2006   PONV (postoperative nausea and vomiting)    Skin cancer    "right forearm"    Past Surgical History:  Procedure Laterality Date   ANTERIOR CERVICAL DECOMP/DISCECTOMY FUSION     BACK SURGERY     CARPAL TUNNEL RELEASE Bilateral    CORONARY ANGIOPLASTY     DES mid LAD and mid RCA 09/21/04   CRANIOTOMY N/A 05/21/2015   Procedure: Transsphenoidal resection of pituitary tumor with Dr. Narda Bonds for approach;  Surgeon: Hilda Lias, MD;  Location: [facility] NEURO ORS;  Service: Neurosurgery;  Laterality: N/A;  Transsphenoidal resection of pituitary tumor with Dr. Narda Bonds for approach   SHOULDER OPEN ROTATOR CUFF REPAIR Bilateral    SKIN CANCER EXCISION Right    forearm   thumb surgery Right    placed srews to make straight   THYROID SURGERY     "goiter removed"    Social History  reports that she quit smoking about 54 years ago. Her smoking use included cigarettes. She started smoking about 59 years ago. She  has a 0.6 pack-year smoking history. She has never used smokeless tobacco. She reports that she does not currently use alcohol. She reports that she does not use drugs.  Allergies  Allergen Reactions   Codeine Nausea And Vomiting    Tolerates hydrocodone    Family History  Problem Relation Age of Onset   Cancer Father 24       throat- smoker    Breast cancer Sister 68   Cancer Brother 69       lung- smoker  Reviewed on admission  Prior to Admission medications   Medication Sig Start Date End Date Taking? Authorizing Provider   aspirin EC 81 MG tablet Take 1 tablet (81 mg total) by mouth daily. Swallow whole. 08/02/23   Orpah Cobb, MD  feeding supplement (ENSURE ENLIVE / ENSURE PLUS) LIQD Take 237 mLs by mouth 2 (two) times daily between meals. Patient taking differently: Take 237 mLs by mouth 2 (two) times daily as needed (skipped meal). 02/20/23   Rinaldo Cloud, MD  HYDROcodone-acetaminophen (NORCO/VICODIN) 5-325 MG tablet Take 1 tablet by mouth every 6 (six) hours as needed for moderate pain (pain score 4-6). 08/01/23   Orpah Cobb, MD  hydrocortisone (CORTEF) 10 MG tablet Take 1 tablet (10 mg total) by mouth every evening. 06/23/22   Hongalgi, Maximino Greenland, MD  hydrocortisone (CORTEF) 20 MG tablet Take 1 tablet (20 mg total) by mouth every morning. 03/06/22   Regalado, Belkys A, MD  losartan (COZAAR) 50 MG tablet Take 50 mg by mouth daily.    [provider]  Multiple Vitamins-Minerals (PRESERVISION AREDS 2) CAPS Take 1 capsule by mouth 2 (two) times daily.    [provider]  pantoprazole (PROTONIX) 40 MG tablet Take 40 mg by mouth daily as needed (heartburn).    [provider]  potassium chloride (KLOR-CON) 10 MEQ tablet Take 10 mEq by mouth daily.    [provider]  rosuvastatin (CRESTOR) 20 MG tablet Take 20 mg by mouth every evening. 10/15/22   [provider]  spironolactone (ALDACTONE) 25 MG tablet Take 0.5 tablets (12.5 mg total) by mouth daily. 02/20/23   Rinaldo Cloud, MD  torsemide (DEMADEX) 10 MG tablet Take 10 mg by mouth daily as needed (fluid, swelling).    [provider]    Physical Exam: Vitals:   09/12/23 1100 09/12/23 1215 09/12/23 1243 09/12/23 1300  BP: 98/64 107/61  (!) 100/58  Pulse: 86 84  87  Resp: 16 15  14   Temp:   97.9 F (36.6 C)   TempSrc:   Temporal   SpO2: 100% 99%  98%  Weight:      Height:        Physical Exam Constitutional:      General: She is not in acute distress.    Appearance: Normal appearance.      Comments: Tired appearing elderly female  HENT:     Head: Normocephalic and atraumatic.     Mouth/Throat:     Mouth: Mucous membranes are moist.     Pharynx: Oropharynx is clear.  Eyes:     Extraocular Movements: Extraocular movements intact.     Pupils: Pupils are equal, round, and reactive to light.  Cardiovascular:     Rate and Rhythm: Normal rate and regular rhythm.     Pulses: Normal pulses.     Heart sounds: Normal heart sounds.  Pulmonary:     Effort: Pulmonary effort is normal. No respiratory distress.     Breath sounds:  Normal breath sounds.  Abdominal:     General: Bowel sounds are normal. There is no distension.     Palpations: Abdomen is soft.     Tenderness: There is no abdominal tenderness.  Musculoskeletal:        General: No swelling or deformity.  Skin:    General: Skin is warm and dry.  Neurological:     General: No focal deficit present.     Mental Status: Mental status is at baseline.    Labs on Admission: I have personally reviewed following labs and imaging studies  CBC: Recent Labs  Lab 09/12/23 0910  WBC 8.1  NEUTROABS 5.1  HGB 14.3  HCT 42.9  MCV 94.9  PLT 266    Basic Metabolic Panel: Recent Labs  Lab 09/12/23 1030 09/12/23 1208  NA 138 140  K 3.6 3.5  CL 105 107  CO2 20* 20*  GLUCOSE 101* 85  BUN 11 10  CREATININE 1.74* 1.73*  CALCIUM 8.9 8.7*    GFR: Estimated Creatinine Clearance: 24.1 mL/min (A) (by C-G formula based on SCr of 1.73 mg/dL (H)).  Liver Function Tests: Recent Labs  Lab 09/12/23 1030 09/12/23 1208  AST 33 33  ALT 13 12  ALKPHOS 51 47  BILITOT 1.0 0.8  PROT 6.5 5.9*  ALBUMIN 3.4* 3.1*    Urine analysis:    Component Value Date/Time   COLORURINE YELLOW 09/12/2023 0911   APPEARANCEUR CLEAR 09/12/2023 0911   LABSPEC 1.013 09/12/2023 0911   PHURINE 6.0 09/12/2023 0911   GLUCOSEU NEGATIVE 09/12/2023 0911   HGBUR NEGATIVE 09/12/2023 0911   BILIRUBINUR NEGATIVE 09/12/2023 0911   KETONESUR NEGATIVE  09/12/2023 0911   PROTEINUR NEGATIVE 09/12/2023 0911   UROBILINOGEN 0.2 03/01/2015 2045   NITRITE NEGATIVE 09/12/2023 0911   LEUKOCYTESUR NEGATIVE 09/12/2023 0911    Radiological Exams on Admission: DG Chest Port 1 View Result Date: 09/12/2023 CLINICAL DATA:  Questionable sepsis EXAM: PORTABLE CHEST 1 VIEW COMPARISON:  None Available. FINDINGS: Normal cardiac silhouette. LEFT basilar atelectasis and small effusion. Upper lungs clear. Anterior cervical fusion IMPRESSION: Small LEFT effusion and basilar atelectasis versus infiltrate. Electronically Signed   By: Genevive Bi M.D.   On: 09/12/2023 10:21   EKG: Independently reviewed.  Sinus rhythm at 89 bpm.  Nonspecific T wave changes.  Assessment/Plan Principal Problem:   Near syncope Active Problems:   Pituitary adenoma (HCC)   Hypertension   Hyperlipidemia   GERD (gastroesophageal reflux disease)   History of CVA (cerebrovascular accident)   Adrenal insufficiency (HCC)   CAD S/P percutaneous coronary angioplasty   Hypotension   Near syncope Hypertension > Patient presenting after episode where she was lethargic at home and had a syncopal event or near syncopal event while in her chair. Denis full LOC. > EMS was called and noted patient to be hypotensive and hypoxic with improvement with supplemental oxygen. > Similar presentation in December of last year and she improved with IV fluids in terms of her blood pressure and was weaned off oxygen.  She did get treatment for possible pneumonia at that time as well.  VQ scan at that time okay. > Elevated D-dimer noted 7.45.  Chest x-ray with small left effusion and questionable atelectasis versus infiltrate.  Initially hypotensive in the ED in the ED send now improved to the 100 systolic.  Received 2 L of IV fluids, ceftriaxone, azithromycin.  Troponin normal. > Given lack of leukocytosis and no obvious pneumonia on chest x-ray lower unlikely for bacterial infection.  Will check  procalcitonin and patient is covered with initial doses of antibiotics for now.  Pending workup and response patient may need medication dose adjustments in terms of for Cortef and antihypertensives. - Monitor on telemetry overnight - Hold off on echocardiogram as this was recently done - Orthostatic vital signs - Hold off on further antibiotics - Procalcitonin - Hold losartan, spironolactone, torsemide - Stress dose steroids in the setting of adrenal insufficiency - Follow-up VQ scan - Supportive care  Hypertension - Hold antihypertensives as above  Hyperlipidemia CAD > History of stenting - Continue home aspirin, rosuvastatin - Holding losartan as above  History of CVA - Continue aspirin, rosuvastatin  Pituitary adenoma Adrenal sufficiency > Status post surgical intervention and radiation for pituitary adenoma. > On Cortef outpatient 20 mg in the a.m. and 10 mg in the p.m. - Single stress dose Solu-Cortef 100 mg this evening pending workup for hypotension - Resume home Cortef tomorrow unless indication to continue stress dose  DVT prophylaxis: Lovenox Code Status:   Full Family Communication:  None on admission. Son previously updated by EDP, but not in room during my exam.   Disposition Plan:   Patient is from:  Home  Anticipated DC to:  Home  Anticipated DC date:  1 to 2 days  Anticipated DC barriers: None  Consults called:  None Admission status:  Observation, telemetry  Severity of Illness: The appropriate patient status for this patient is OBSERVATION. Observation status is judged to be reasonable and necessary in order to provide the required intensity of service to ensure the patient's safety. The patient's presenting symptoms, physical exam findings, and initial radiographic and laboratory data in the context of their medical condition is felt to place them at decreased risk for further clinical deterioration. Furthermore, it is anticipated that the patient will  be medically stable for discharge from the hospital within 2 midnights of admission.    Synetta Fail MD [facility]  How to contact the [facility] Attending or Consulting provider 7A - 7P or covering provider during after hours 7P -7A, for this patient?   Check the care team in [facility] and look for a) attending/consulting [facility] provider listed and b) the [facility] team listed Log into [email or web address] and use [place]'s universal password to access. If you do not have the password, please contact the hospital operator. Locate the [facility] provider you are looking for under [facility] and page to a number that you can be directly reached. If you still have difficulty reaching the provider, please page the [facility] (Director on Call) for the Hospitalists listed on amion for assistance.  09/12/2023, 2:08 PM

## 2023-09-12 NOTE — ED Provider Notes (Signed)
[place] EMERGENCY DEPARTMENT AT [facility] Provider Note   CSN: [id number] Arrival date & time: 09/12/23  0848     History  Chief Complaint  Patient presents with   Shortness of Breath    Adriana Jordan is a 89 y.o. female.  With a history of pituitary adenoma, adrenal insufficiency, hypokalemia and respiratory failure presents the ED for near syncopal episode.  She lives with her son who noted the patient to be lethargic this morning.  EMS found her to be hypotensive and hypoxic with improvement after initiation of 15 L nonrebreather.  She has had similar episodes of syncopal/near syncopal episodes in the past with acute respiratory failure, most recently in December 2024 in which she was seen in this ED and subsequently admitted.  Today she denies chest pain, headaches, abdominal pain, fevers, chills, nausea, vomiting, diarrhea and recent illness.  Additional history limited at this time secondary to clinical condition   Shortness of Breath      Home Medications Prior to Admission medications   Medication Sig Start Date End Date Taking? Authorizing Provider  aspirin EC 81 MG tablet Take 1 tablet (81 mg total) by mouth daily. Swallow whole. 08/02/23   Orpah Cobb, MD  feeding supplement (ENSURE ENLIVE / ENSURE PLUS) LIQD Take 237 mLs by mouth 2 (two) times daily between meals. Patient taking differently: Take 237 mLs by mouth 2 (two) times daily as needed (skipped meal). 02/20/23   Rinaldo Cloud, MD  HYDROcodone-acetaminophen (NORCO/VICODIN) 5-325 MG tablet Take 1 tablet by mouth every 6 (six) hours as needed for moderate pain (pain score 4-6). 08/01/23   Orpah Cobb, MD  hydrocortisone (CORTEF) 10 MG tablet Take 1 tablet (10 mg total) by mouth every evening. 06/23/22   Hongalgi, Maximino Greenland, MD  hydrocortisone (CORTEF) 20 MG tablet Take 1 tablet (20 mg total) by mouth every morning. 03/06/22   Regalado, Belkys A, MD  losartan (COZAAR) 50 MG tablet Take 50 mg by mouth  daily.    [provider]  Multiple Vitamins-Minerals (PRESERVISION AREDS 2) CAPS Take 1 capsule by mouth 2 (two) times daily.    [provider]  pantoprazole (PROTONIX) 40 MG tablet Take 40 mg by mouth daily as needed (heartburn).    [provider]  potassium chloride (KLOR-CON) 10 MEQ tablet Take 10 mEq by mouth daily.    [provider]  rosuvastatin (CRESTOR) 20 MG tablet Take 20 mg by mouth every evening. 10/15/22   [provider]  spironolactone (ALDACTONE) 25 MG tablet Take 0.5 tablets (12.5 mg total) by mouth daily. 02/20/23   Rinaldo Cloud, MD  torsemide (DEMADEX) 10 MG tablet Take 10 mg by mouth daily as needed (fluid, swelling).    [provider]      Allergies    Codeine    Review of Systems   Review of Systems  Respiratory:  Positive for shortness of breath.     Physical Exam Updated Vital Signs BP (!) 99/58 (BP Location: Right Arm)   Pulse 83   Temp 99 F (37.2 C) (Oral)   Resp 18   Ht 5\' 6"  (1.676 m)   Wt 74.3 kg   LMP  (LMP Unknown)   SpO2 95%   BMI 26.44 kg/m  Physical Exam Vitals and nursing note reviewed.  HENT:     Head: Normocephalic and atraumatic.  Eyes:     Pupils: Pupils are equal, round, and reactive to light.  Cardiovascular:     Rate  and Rhythm: Normal rate and regular rhythm.  Pulmonary:     Effort: Pulmonary effort is normal.     Breath sounds: Normal breath sounds.  Abdominal:     Palpations: Abdomen is soft.     Tenderness: There is no abdominal tenderness.  Skin:    General: Skin is warm and dry.  Neurological:     General: No focal deficit present.     Mental Status: She is alert and oriented to person, place, and time.     Motor: No weakness.  Psychiatric:        Mood and Affect: Mood normal.     ED Results / Procedures / Treatments   Labs (all labs ordered are listed, but only abnormal results are displayed) Labs Reviewed  RESP PANEL BY RT-PCR (RSV, FLU A&B, COVID)   RVPGX2 - Abnormal; Notable for the following components:      Result Value   SARS Coronavirus 2 by RT PCR POSITIVE (*)    All other components within normal limits  D-DIMER, QUANTITATIVE - Abnormal; Notable for the following components:   D-Dimer, Quant 7.45 (*)    All other components within normal limits  COMPREHENSIVE METABOLIC PANEL - Abnormal; Notable for the following components:   CO2 20 (*)    Glucose, Bld 101 (*)    Creatinine, Ser 1.74 (*)    Albumin 3.4 (*)    GFR, Estimated 28 (*)    All other components within normal limits  COMPREHENSIVE METABOLIC PANEL - Abnormal; Notable for the following components:   CO2 20 (*)    Creatinine, Ser 1.73 (*)    Calcium 8.7 (*)    Total Protein 5.9 (*)    Albumin 3.1 (*)    GFR, Estimated 28 (*)    All other components within normal limits  CBG MONITORING, ED - Abnormal; Notable for the following components:   Glucose-Capillary 102 (*)    All other components within normal limits  CULTURE, BLOOD (ROUTINE X 2)  CULTURE, BLOOD (ROUTINE X 2)  CBC WITH DIFFERENTIAL/PLATELET  PROTIME-INR  APTT  URINALYSIS, W/ REFLEX TO CULTURE (INFECTION SUSPECTED)  TSH  PROCALCITONIN  COMPREHENSIVE METABOLIC PANEL  CBC  TROPONIN I (HIGH SENSITIVITY)  TROPONIN I (HIGH SENSITIVITY)    EKG EKG Interpretation Date/Time:  Monday September 12 2023 08:59:09 EST Ventricular Rate:  89 PR Interval:  178 QRS Duration:  93 QT Interval:  373 QTC Calculation: 454 R Axis:   -58  Text Interpretation: Sinus rhythm Consider right ventricular hypertrophy Inferior infarct, old Consider anterior infarct Confirmed by Estelle June [phone]) on 09/12/2023 5:26:17 PM  Radiology NM Pulmonary Perfusion Result Date: 09/12/2023 CLINICAL DATA:  Pulmonary embolus suspected with low to intermediate probability. Positive D-dimer. Syncope. EXAM: NUCLEAR MEDICINE PERFUSION LUNG SCAN TECHNIQUE: Perfusion images were obtained in multiple projections after intravenous injection  of radiopharmaceutical. Ventilation scans intentionally deferred if perfusion scan and chest x-ray adequate for interpretation during COVID 19 epidemic. RADIOPHARMACEUTICALS:  4.1 mCi Tc-59m MAA IV COMPARISON:  Chest radiograph 09/12/2023. Radionuclide perfusion scan 08/01/2023 FINDINGS: Normal homogeneous distribution of tracer activity demonstrated throughout both lungs. No perfusion defects are demonstrated. IMPRESSION: Normal examination.  No evidence of acute pulmonary embolus. Electronically Signed   By: Burman Nieves M.D.   On: 09/12/2023 17:14   DG Chest Port 1 View Result Date: 09/12/2023 CLINICAL DATA:  Questionable sepsis EXAM: PORTABLE CHEST 1 VIEW COMPARISON:  None Available. FINDINGS: Normal cardiac silhouette. LEFT basilar atelectasis and small effusion. Upper lungs clear. Anterior  cervical fusion IMPRESSION: Small LEFT effusion and basilar atelectasis versus infiltrate. Electronically Signed   By: Genevive Bi M.D.   On: 09/12/2023 10:21    Procedures Procedures    Medications Ordered in ED Medications  aspirin EC tablet 81 mg (has no administration in time range)  rosuvastatin (CRESTOR) tablet 20 mg (has no administration in time range)  hydrocortisone (CORTEF) tablet 20 mg (has no administration in time range)  hydrocortisone (CORTEF) tablet 10 mg (has no administration in time range)  pantoprazole (PROTONIX) EC tablet 40 mg (has no administration in time range)  feeding supplement (ENSURE ENLIVE / ENSURE PLUS) liquid 237 mL (has no administration in time range)  sodium chloride flush (NS) 0.9 % injection 3 mL (3 mLs Intravenous Given 09/12/23 1556)  enoxaparin (LOVENOX) injection 30 mg (has no administration in time range)  acetaminophen (TYLENOL) tablet 650 mg (has no administration in time range)    Or  acetaminophen (TYLENOL) suppository 650 mg (has no administration in time range)  polyethylene glycol (MIRALAX / GLYCOLAX) packet 17 g (has no administration in time  range)  sodium chloride 0.9 % bolus 1,000 mL (0 mLs Intravenous Stopped 09/12/23 0954)  sodium chloride 0.9 % bolus 1,000 mL (1,000 mLs Intravenous New Bag/Given 09/12/23 0954)  cefTRIAXone (ROCEPHIN) 1 g in sodium chloride 0.9 % 100 mL IVPB (0 g Intravenous Stopped 09/12/23 1112)  azithromycin (ZITHROMAX) 500 mg in sodium chloride 0.9 % 250 mL IVPB (0 mg Intravenous Stopped 09/12/23 1217)  hydrocortisone sodium succinate (SOLU-CORTEF) 100 MG injection 100 mg (100 mg Intravenous Given 09/12/23 1556)  technetium albumin aggregated (MAA) injection solution 4.1 millicurie (4.1 millicuries Intravenous Contrast Given 09/12/23 1551)    ED Course/ Medical Decision Making/ A&P Clinical Course as of 09/12/23 1726  Mon Sep 12, 2023  1029 Chest x-ray shows small left effusion with atelectasis versus infiltrate.  Will treat for community-acquired pneumonia with ceftriaxone and azithromycin.  D-dimer once again significantly elevated.  Awaiting metabolic panel to look at her renal function to see if she can complete CTA with IV contrast.  Last time she had a CT chest without contrast [MP]  1241 CMP had initially hemolyzed but has now resulted.  Initial GFR here is 28.  She will need a VQ scan during her admission.  Paged hospitalist.  Patient has remained hemodynamically stable up to this point after 2 L normal saline approximately 30 cc/kg [MP]  1323 Discussed with Dr.harwani who agrees with plan for admission.  Will page out to [facility] [MP]    Clinical Course User Index [MP] Royanne Foots, DO                                 Medical Decision Making 89 year old female with history as above Presenting for near syncopal episode with hypotension and hypoxemia.  Initial blood pressure with systolic in the 80s.  No tachycardia or fever.  Awake alert oriented without focal neurologic deficit.  Presentation concerning for sepsis.  Will order infectious workup and initiate IV fluid bolus and continue to monitor  hemodynamic status.  Most likely source would be respiratory or urinary.  Other differentials to consider would be adrenal insufficiency given her history of this.  In light of hypoxemia and tachycardia we will obtain D-dimer to evaluate for potential PE as cause of her presentation today and CT of the chest if D-dimer significantly elevated  Amount and/or Complexity of Data Reviewed  Labs: ordered. Radiology: ordered.  Risk Decision regarding hospitalization.           Final Clinical Impression(s) / ED Diagnoses Final diagnoses:  Acute hypoxemic respiratory failure (HCC)  HCAP (healthcare-associated pneumonia)  Hypotension, unspecified hypotension type    Rx / DC Orders ED Discharge Orders     None         Royanne Foots, DO 09/12/23 1726

## 2023-09-13 DIAGNOSIS — J9601 Acute respiratory failure with hypoxia: Secondary | ICD-10-CM | POA: Diagnosis not present

## 2023-09-13 DIAGNOSIS — I959 Hypotension, unspecified: Secondary | ICD-10-CM | POA: Diagnosis not present

## 2023-09-13 DIAGNOSIS — E2740 Unspecified adrenocortical insufficiency: Secondary | ICD-10-CM | POA: Diagnosis not present

## 2023-09-13 DIAGNOSIS — R55 Syncope and collapse: Secondary | ICD-10-CM | POA: Diagnosis not present

## 2023-09-13 LAB — COMPREHENSIVE METABOLIC PANEL
ALT: 12 U/L (ref 0–44)
AST: 29 U/L (ref 15–41)
Albumin: 2.9 g/dL — ABNORMAL LOW (ref 3.5–5.0)
Alkaline Phosphatase: 42 U/L (ref 38–126)
Anion gap: 14 (ref 5–15)
BUN: 14 mg/dL (ref 8–23)
CO2: 21 mmol/L — ABNORMAL LOW (ref 22–32)
Calcium: 8.3 mg/dL — ABNORMAL LOW (ref 8.9–10.3)
Chloride: 103 mmol/L (ref 98–111)
Creatinine, Ser: 1.71 mg/dL — ABNORMAL HIGH (ref 0.44–1.00)
GFR, Estimated: 29 mL/min — ABNORMAL LOW (ref >60)
Glucose, Bld: 113 mg/dL — ABNORMAL HIGH (ref 70–99)
Potassium: 3.6 mmol/L (ref 3.5–5.1)
Sodium: 138 mmol/L (ref 135–145)
Total Bilirubin: 0.7 mg/dL (ref 0.0–1.2)
Total Protein: 5.7 g/dL — ABNORMAL LOW (ref 6.5–8.1)

## 2023-09-13 LAB — CBC
HCT: 34.2 % — ABNORMAL LOW (ref 36.0–46.0)
Hemoglobin: 11.8 g/dL — ABNORMAL LOW (ref 12.0–15.0)
MCH: 32.2 pg (ref 26.0–34.0)
MCHC: 34.5 g/dL (ref 30.0–36.0)
MCV: 93.4 fL (ref 80.0–100.0)
Platelets: 226 10*3/uL (ref 150–400)
RBC: 3.66 MIL/uL — ABNORMAL LOW (ref 3.87–5.11)
RDW: 11.9 % (ref 11.5–15.5)
WBC: 4.2 10*3/uL (ref 4.0–10.5)
nRBC: 0 % (ref 0.0–0.2)

## 2023-09-13 LAB — GLUCOSE, CAPILLARY: Glucose-Capillary: 100 mg/dL — ABNORMAL HIGH (ref 70–99)

## 2023-09-13 MED ORDER — SODIUM CHLORIDE 0.9 % IV SOLN: INTRAVENOUS | Status: AC

## 2023-09-13 MED ORDER — DOXYCYCLINE HYCLATE 100 MG PO TABS
100.0000 mg | ORAL_TABLET | Freq: Two times a day (BID) | ORAL | Status: DC
  Administered 2023-09-13 – 2023-09-14 (×3): 100 mg via ORAL
  Filled 2023-09-13 (×3): qty 1

## 2023-09-13 NOTE — Progress Notes (Signed)
 Transition of Care [facility]) - Inpatient Brief Assessment   Patient Details  Name: MAHNOOR MATHISEN MRN: [id number] Date of Birth: 10-23-1934  Transition of Care [facility]) CM/SW Contact:    Rosaline JONELLE Joe, RN Phone Number: 09/13/2023, 10:48 AM   Clinical Narrative: Transition of Care [facility]) - Inpatient Brief Assessment   Patient Details  Name: DEBROAH SHUTTLEWORTH MRN: [id number] Date of Birth: 12-Dec-1934  Transition of Care [facility]) CM/SW Contact:    Rosaline JONELLE Joe, RN Phone Number: 09/13/2023, 10:48 AM   Clinical Narrative: CM met with the patient at the bedside to discuss TOC needs.  The patient lives with her son at the home and has a Medical Laboratory Scientific Officer and RW (does not use).  Patient was provided with [facility] letter at the bedside.  Patient is currently on nasal cannula oxygen due to respiratory symptoms but is normally on room air.  No other TOC needs at this time but will likely discharge back home when medically stable for discharge.   Transition of Care Asessment: Insurance and Status: (P) Insurance coverage has been reviewed Patient has primary care physician: (P) Yes Home environment has been reviewed: (P) From home with son Prior level of function:: (P) family assistance Prior/Current Home Services: (P) No current home services Social Drivers of Health Review: (P) SDOH reviewed needs interventions Readmission risk has been reviewed: (P) Yes Transition of care needs: (P) transition of care needs identified, TOC will continue to follow    Transition of Care Asessment: Insurance and Status: (P) Insurance coverage has been reviewed Patient has primary care physician: (P) Yes Home environment has been reviewed: (P) From home with son Prior level of function:: (P) family assistance Prior/Current Home Services: (P) No current home services Social Drivers of Health Review: (P) SDOH reviewed needs interventions Readmission risk has been reviewed: (P) Yes Transition of care  needs: (P) transition of care needs identified, TOC will continue to follow

## 2023-09-13 NOTE — Plan of Care (Signed)
   Problem: Education: Goal: Knowledge of risk factors and measures for prevention of condition will improve Outcome: Progressing   Problem: Coping: Goal: Psychosocial and spiritual needs will be supported Outcome: Progressing   Problem: Respiratory: Goal: Will maintain a patent airway Outcome: Progressing

## 2023-09-13 NOTE — Evaluation (Signed)
 Physical Therapy Evaluation Patient Details Name: Adriana Jordan MRN: [id number] DOB: 03/12/1935 Today's Date: 09/13/2023  History of Present Illness  89 yo female presents [facility] on [facility]/12[facility]23 with increased lethargy, near syncope. Pt hypotensive and hypoxic with EMS. Found to be + covid.  PMH includes pituitary adenoma s/p surgical resection and radiation, recent concern for adrenal insufficiency, CVA, GERD, syncope, HTN, HLD, CAD with remote MI, CKD, right arm skin cancer, cervical spine DDD s/p ACDF.  Clinical Impression  Pt admitted with above diagnosis. Pt feeling much better than when she arrived. She relayed that she slid out of her chair at home onto the floor and son could not get her up. It was during the time that he was trying that she had episode of decreased responsiveness. Pt needed mod A to stand from bed and toilet and seems to be generally weaker than her baseline but SPO2 remained in 90's on RA throughout. Pt ambulated 20' with RW and CGA. Recommend HHPT at d/c.  Pt currently with functional limitations due to the deficits listed below (see PT Problem List). Pt will benefit from acute skilled PT to increase their independence and safety with mobility to allow discharge.           If plan is discharge home, recommend the following: Assist for transportation;Help with stairs or ramp for entrance;Assistance with cooking/housework;A little help with walking and/or transfers   Can travel by private vehicle        Equipment Recommendations None recommended by PT  Recommendations for Other Services       Functional Status Assessment Patient has had a recent decline in their functional status and demonstrates the ability to make significant improvements in function in a reasonable and predictable amount of time.     Precautions / Restrictions Precautions Precautions: Fall Precaution Comments: pt reports that she slid out of her chair at home, urinated on herself, son could not  get her up, and had decreased responsiveness while he was trying to help her up Restrictions Weight Bearing Restrictions Per Provider Order: No      Mobility  Bed Mobility Overal bed mobility: Needs Assistance Bed Mobility: Supine to Sit     Supine to sit: Supervision, HOB elevated     General bed mobility comments: increased time needed from [facility] elevated, no physical assist    Transfers Overall transfer level: Needs assistance Equipment used: Rolling walker (2 wheels) Transfers: Sit to/from Stand Sit to Stand: Mod assist           General transfer comment: needed mod A for power up from bed and toilet    Ambulation/Gait Ambulation/Gait assistance: Min assist Gait Distance (Feet): 20 Feet Assistive device: Rolling walker (2 wheels) Gait Pattern/deviations: Step-through pattern, Decreased stride length Gait velocity: decreased Gait velocity interpretation: <1.31 ft/sec, indicative of household ambulator   General Gait Details: short, low steps, fwd flexed posture  Stairs            Wheelchair Mobility     Tilt Bed    Modified Rankin (Stroke Patients Only)       Balance Overall balance assessment: Needs assistance Sitting-balance support: Feet supported, No upper extremity supported Sitting balance-Leahy Scale: Good     Standing balance support: Single extremity supported, During functional activity Standing balance-Leahy Scale: Poor Standing balance comment: pt needed single UE support to reach fwd in standing for perineal care  Pertinent Vitals/Pain Pain Assessment Pain Assessment: Faces Faces Pain Scale: Hurts little more Pain Location: R knee (pt reports she needs surgery on that knee) Pain Descriptors / Indicators: Aching Pain Intervention(s): Limited activity within patient's tolerance, Monitored during session    Home Living Family/patient expects to be discharged to:: Private residence Living  Arrangements: Children Available Help at Discharge: Family;Available 24 hours/day Type of Home: House Home Access: Stairs to enter Entrance Stairs-Rails: Right Entrance Stairs-Number of Steps: 3   Home Layout: One level Home Equipment: Agricultural Consultant (2 wheels);Cane - single point Additional Comments: info taken from chart as patient had increased confusion and hallicinations during session    Prior Function Prior Level of Function : Independent/Modified Independent;Driving             Mobility Comments: Patient reports she was independent prior to admission. Rarely uses RW, only for community ambulation. Able to perform household ambulation without cane or RW. ADLs Comments: Independent     Extremity/Trunk Assessment   Upper Extremity Assessment Upper Extremity Assessment: Generalized weakness    Lower Extremity Assessment Lower Extremity Assessment: Generalized weakness    Cervical / Trunk Assessment Cervical / Trunk Assessment: Kyphotic  Communication   Communication Communication: No apparent difficulties Cueing Techniques: Verbal cues  Cognition Arousal: Alert Behavior During Therapy: WFL for tasks assessed/performed Overall Cognitive Status: Within Functional Limits for tasks assessed                                 General Comments: for basic conversation and PLOF        General Comments General comments (skin integrity, edema, etc.): SPO2 93-96% on RA. HR in 70's. Gave pt IS and pt practiced. She is familiar with use. Pulled 1000    Exercises     Assessment/Plan    PT Assessment Patient needs continued PT services  PT Problem List Decreased strength;Decreased balance;Decreased mobility;Decreased activity tolerance       PT Treatment Interventions DME instruction;Gait training;Stair training;Functional mobility training;Therapeutic activities;Therapeutic exercise;Balance training;Patient/family education    PT Goals (Current goals can  be found in the Care Plan section)  Acute Rehab PT Goals Patient Stated Goal: return home PT Goal Formulation: With patient Time For Goal Achievement: 09/27/23 Potential to Achieve Goals: Good    Frequency Min 1X/week     Co-evaluation               AM-PAC PT 6 Clicks Mobility  Outcome Measure Help needed turning from your back to your side while in a flat bed without using bedrails?: None Help needed moving from lying on your back to sitting on the side of a flat bed without using bedrails?: None Help needed moving to and from a bed to a chair (including a wheelchair)?: A Little Help needed standing up from a chair using your arms (e.g., wheelchair or bedside chair)?: A Lot Help needed to walk in hospital room?: A Little Help needed climbing 3-5 steps with a railing? : A Lot 6 Click Score: 18    End of Session Equipment Utilized During Treatment: Gait belt Activity Tolerance: Patient tolerated treatment well Patient left: in chair;with call bell/phone within reach;with chair alarm set Nurse Communication: Mobility status PT Visit Diagnosis: Muscle weakness (generalized) (M62.81);History of falling (Z91.81);Difficulty in walking, not elsewhere classified (R26.2)    Time: 8363-8288 PT Time Calculation (min) (ACUTE ONLY): 35 min   Charges:   PT Evaluation $PT Eval Moderate  Complexity: 1 Mod PT Treatments $Gait Training: 8-22 mins PT General Charges $$ ACUTE PT VISIT: 1 Visit         Richerd Lipoma, PT  Acute Rehab Services Secure chat preferred Office [phone]   Richerd CROME Kauan Kloosterman 09/13/2023, 5:20 PM

## 2023-09-13 NOTE — Progress Notes (Signed)
 [facility] PROGRESS NOTE   Adriana Jordan FMW:[id number] DOB: August 27, 1934 DOA: 09/12/2023  PCP: [name], MD  Brief History: 89 y.o. female with medical history significant of hypertension, hyperlipidemia, GERD, CVA, syncope, CAD, pituitary adenoma, adrenal insufficiency presenting with near syncopal episode. Patient lives with her son.  He reports she had increased lethargy this morning.  She was in a recliner when he noted she appeared to pass out.  EMS was called and patient was found to be hypotensive and hypoxic and she improved on 15 L. Of note had a similar episode in December of last year with weakness, hypotension, hypoxia.  Blood pressure improved with IV fluids and patient completed a course of antibiotics for questionable pneumonia and was discharged home.  VQ scan negative at that time.   Consultants: None  Procedures: None    Subjective/Interval History: Patient denies any chest pain.  Some shortness of breath.  Occasional cough.  Her son has had respiratory symptoms recently.    Assessment/Plan:  COVID-19 COVID-19 PCR was noted to be positive.  Patient mentioned that her son has been sick recently.   Chest x-ray showed small left effusion and basilar atelectasis versus infiltrates. No patchy opacities noted. Patient however was noted to be hypoxic and requiring oxygen. No clear indication to use Remdesivir at this time.  Patient already on hydrocortisone  for adrenal insufficiency.  Keep on current dose for now.   Continue to monitor.  Acute respiratory failure with hypoxia/possible pneumonia WBC is normal.  VQ scan was unremarkable.  Procalcitonin 0.64. Will initiate doxycycline . Try to wean down oxygen.  Near syncope Could have been related to COVID-19.  D-dimer is elevated but VQ scan is negative.  Soft blood pressures noted.  Holding her antihypertensives. Recently done echocardiogram in December showed normal LVEF with grade 1 diastolic  dysfunction.  No significant valvular disease was noted.  Hypotension Could be due to hypovolemia.  She was noted to be on losartan  and spironolactone  and torsemide  prior to admission.  These are on hold.  She is also noted to be on steroids for history of adrenal insufficiency.  She was given Solu-Cortef  x 1 in the ED. Blood pressures are low but she is asymptomatic.  Started back on her hydrocortisone  tablets.  No clear indication for stress dosing currently.  Continue to monitor closely. Continue to hold her antihypertensives. Will give her gentle IV hydration for 24 hours.  Hyperlipidemia/coronary artery disease Has history of stenting. Cardiac status is stable.  History of stroke Stable.  Pituitary adenoma Status post surgical intervention and radiation.  Followed by Dr. [name].  Has appointment with them in the next few weeks.  Adrenal insufficiency On hydrocortisone  twice a day which is being continued.  Was given 1 dose of Solu-Cortef  in the ED.  No clear indication for stress dose steroids currently.  Normocytic anemia No evidence for overt bleeding.  Elevated creatinine, chronic kidney disease, unspecified stage Creatinine is about the same as what it was in December.  Slightly higher than what it was last summer.  Monitor urine output.  Avoid nephrotoxic agents.  Recheck labs tomorrow.  DVT Prophylaxis: Lovenox  Code Status: Full code Family Communication: Discussed with patient Disposition Plan: Hopefully home when improved.  PT and OT evaluation.  Status is: Observation The patient will require care spanning > 2 midnights and should be moved to inpatient because: Low blood pressure      Medications: Scheduled:  aspirin  EC  81 mg Oral Daily   enoxaparin  (  LOVENOX ) injection  30 mg Subcutaneous Q24H   hydrocortisone   10 mg Oral QPM   hydrocortisone   20 mg Oral BH-q7a   pantoprazole   40 mg Oral Daily   rosuvastatin   20 mg Oral QPM   sodium chloride  flush  3 mL  Intravenous Q12H   Continuous: PRN:acetaminophen  **OR** acetaminophen , feeding supplement, polyethylene glycol  Antibiotics: Anti-infectives (From admission, onward)    Start     Dose/Rate Route Frequency Ordered Stop   09/12/23 1045  cefTRIAXone  (ROCEPHIN ) 1 g in sodium chloride  0.9 % 100 mL IVPB        1 g 200 mL/hr over 30 Minutes Intravenous  Once 09/12/23 1030 09/12/23 1112   09/12/23 1045  azithromycin  (ZITHROMAX ) 500 mg in sodium chloride  0.9 % 250 mL IVPB        500 mg 250 mL/hr over 60 Minutes Intravenous  Once 09/12/23 1030 09/12/23 1217       Objective:  Vital Signs  Vitals:   09/13/23 0100 09/13/23 0403 09/13/23 0413 09/13/23 0747  BP: (!) 90/58 (!) 95/56  (!) 102/59  Pulse:  78  71  Resp:  18  17  Temp:  98.7 F (37.1 C)  98.2 F (36.8 C)  TempSrc:  Oral  Oral  SpO2:  97%  99%  Weight:   74 kg   Height:        Intake/Output Summary (Last 24 hours) at 09/13/2023 1106 Last data filed at 09/13/2023 0944 Gross per 24 hour  Intake 243 ml  Output --  Net 243 ml   Filed Weights   09/12/23 0854 09/13/23 0413  Weight: 74.3 kg 74 kg    General appearance: Awake alert.  In no distress Resp: Coarse breath sounds laterally.  Few crackles at the bases.  No wheezing or rhonchi. Cardio: S1-S2 is normal regular.  No S3-S4.  No rubs murmurs or bruit GI: Abdomen is soft.  Nontender nondistended.  Bowel sounds are present normal.  No masses organomegaly Extremities: No edema.  Physical deconditioning noted. Neurologic: Alert and oriented x3.  No focal neurological deficits.    Lab Results:  Data Reviewed: I have personally reviewed following labs and reports of the imaging studies  CBC: Recent Labs  Lab 09/12/23 0910 09/13/23 0551  WBC 8.1 4.2  NEUTROABS 5.1  --   HGB 14.3 11.8*  HCT 42.9 34.2*  MCV 94.9 93.4  PLT 266 226    Basic Metabolic Panel: Recent Labs  Lab 09/12/23 1030 09/12/23 1208 09/13/23 0551  NA 138 140 138  K 3.6 3.5 3.6  CL 105  107 103  CO2 20* 20* 21*  GLUCOSE 101* 85 113*  BUN 11 10 14   CREATININE 1.74* 1.73* 1.71*  CALCIUM  8.9 8.7* 8.3*    GFR: Estimated Creatinine Clearance: 24.3 mL/min (A) (by C-G formula based on SCr of 1.71 mg/dL (H)).  Liver Function Tests: Recent Labs  Lab 09/12/23 1030 09/12/23 1208 09/13/23 0551  AST 33 33 29  ALT 13 12 12   ALKPHOS 51 47 42  BILITOT 1.0 0.8 0.7  PROT 6.5 5.9* 5.7*  ALBUMIN  3.4* 3.1* 2.9*    Coagulation Profile: Recent Labs  Lab 09/12/23 0910  INR 1.1    CBG: Recent Labs  Lab 09/12/23 0855 09/13/23 0703  GLUCAP 102* 100*    Thyroid  Function Tests: Recent Labs    09/12/23 0930  TSH 3.215    Recent Results (from the past 240 hours)  Blood Culture (routine x 2)  Status: None (Preliminary result)   Collection Time: 09/12/23  9:00 AM   Specimen: BLOOD  Result Value Ref Range Status   Specimen Description BLOOD SITE NOT SPECIFIED  Final   Special Requests   Final    BOTTLES DRAWN AEROBIC AND ANAEROBIC Blood Culture results may not be optimal due to an inadequate volume of blood received in culture bottles   Culture   Final    NO GROWTH < 24 HOURS Performed at [facility], [address] [place], [place] [address]    Report Status PENDING  Incomplete  Resp panel by RT-PCR (RSV, Flu A&B, Covid) Peripheral     Status: Abnormal   Collection Time: 09/12/23  9:00 AM   Specimen: Peripheral; Nasal Swab  Result Value Ref Range Status   SARS Coronavirus 2 by RT PCR POSITIVE (A) NEGATIVE Final   Influenza A by PCR NEGATIVE NEGATIVE Final   Influenza B by PCR NEGATIVE NEGATIVE Final    Comment: (NOTE) The Xpert Xpress SARS-CoV-2/FLU/RSV plus assay is intended as an aid in the diagnosis of influenza from Nasopharyngeal swab specimens and should not be used as a sole basis for treatment. Nasal washings and aspirates are unacceptable for Xpert Xpress SARS-CoV-2/FLU/RSV testing.  Fact Sheet for  Patients: bloggercourse.com  Fact Sheet for Healthcare Providers: seriousbroker.it  This test is not yet approved or cleared by the [place]  FDA and has been authorized for detection and/or diagnosis of SARS-CoV-2 by FDA under an Emergency Use Authorization (EUA). This EUA will remain in effect (meaning this test can be used) for the duration of the COVID-19 declaration under Section 564(b)(1) of the Act, 21 U.S.C. section 360bbb-3(b)(1), unless the authorization is terminated or revoked.     Resp Syncytial Virus by PCR NEGATIVE NEGATIVE Final    Comment: (NOTE) Fact Sheet for Patients: bloggercourse.com  Fact Sheet for Healthcare Providers: seriousbroker.it  This test is not yet approved or cleared by the [place]  FDA and has been authorized for detection and/or diagnosis of SARS-CoV-2 by FDA under an Emergency Use Authorization (EUA). This EUA will remain in effect (meaning this test can be used) for the duration of the COVID-19 declaration under Section 564(b)(1) of the Act, 21 U.S.C. section 360bbb-3(b)(1), unless the authorization is terminated or revoked.  Performed at [facility], [address], [place], [place] [address]   Blood Culture (routine x 2)     Status: None (Preliminary result)   Collection Time: 09/12/23  9:05 AM   Specimen: BLOOD  Result Value Ref Range Status   Specimen Description BLOOD SITE NOT SPECIFIED  Final   Special Requests   Final    BOTTLES DRAWN AEROBIC AND ANAEROBIC Blood Culture results may not be optimal due to an inadequate volume of blood received in culture bottles   Culture   Final    NO GROWTH < 24 HOURS Performed at [facility], [address], [place], [place] [address]    Report Status PENDING  Incomplete      Radiology Studies: NM Pulmonary Perfusion Result Date: 09/12/2023 CLINICAL DATA:  Pulmonary  embolus suspected with low to intermediate probability. Positive D-dimer. Syncope. EXAM: NUCLEAR MEDICINE PERFUSION LUNG SCAN TECHNIQUE: Perfusion images were obtained in multiple projections after intravenous injection of radiopharmaceutical. Ventilation scans intentionally deferred if perfusion scan and chest x-ray adequate for interpretation during COVID 19 epidemic. RADIOPHARMACEUTICALS:  4.1 mCi Tc-46m MAA IV COMPARISON:  Chest radiograph 09/12/2023. Radionuclide perfusion scan 08/01/2023 FINDINGS: Normal homogeneous  distribution of tracer activity demonstrated throughout both lungs. No perfusion defects are demonstrated. IMPRESSION: Normal examination.  No evidence of acute pulmonary embolus. Electronically Signed   By: Elsie Gravely M.D.   On: 09/12/2023 17:14   DG Chest Port 1 View Result Date: 09/12/2023 CLINICAL DATA:  Questionable sepsis EXAM: PORTABLE CHEST 1 VIEW COMPARISON:  None Available. FINDINGS: Normal cardiac silhouette. LEFT basilar atelectasis and small effusion. Upper lungs clear. Anterior cervical fusion IMPRESSION: Small LEFT effusion and basilar atelectasis versus infiltrate. Electronically Signed   By: Jackquline Boxer M.D.   On: 09/12/2023 10:21       LOS: 0 days   Izaha Shughart Adriana Jordan  [facility] Pager on [email or web address]  09/13/2023, 11:06 AM

## 2023-09-13 NOTE — Care Management Obs Status (Cosign Needed)
MEDICARE OBSERVATION STATUS NOTIFICATION   Patient Details  Name: Adriana Jordan MRN: [id number] Date of Birth: 1934-10-21   Medicare Observation Status Notification Given:  Yes    Janae Bridgeman, RN 09/13/2023, 10:41 AM

## 2023-09-14 ENCOUNTER — Other Ambulatory Visit (HOSPITAL_COMMUNITY): Payer: Self-pay

## 2023-09-14 DIAGNOSIS — R55 Syncope and collapse: Secondary | ICD-10-CM | POA: Diagnosis not present

## 2023-09-14 DIAGNOSIS — J189 Pneumonia, unspecified organism: Secondary | ICD-10-CM | POA: Diagnosis not present

## 2023-09-14 DIAGNOSIS — J9601 Acute respiratory failure with hypoxia: Secondary | ICD-10-CM | POA: Diagnosis not present

## 2023-09-14 LAB — BASIC METABOLIC PANEL
Anion gap: 11 (ref 5–15)
BUN: 14 mg/dL (ref 8–23)
CO2: 20 mmol/L — ABNORMAL LOW (ref 22–32)
Calcium: 8.4 mg/dL — ABNORMAL LOW (ref 8.9–10.3)
Chloride: 109 mmol/L (ref 98–111)
Creatinine, Ser: 1.36 mg/dL — ABNORMAL HIGH (ref 0.44–1.00)
GFR, Estimated: 38 mL/min — ABNORMAL LOW (ref >60)
Glucose, Bld: 95 mg/dL (ref 70–99)
Potassium: 3.2 mmol/L — ABNORMAL LOW (ref 3.5–5.1)
Sodium: 140 mmol/L (ref 135–145)

## 2023-09-14 LAB — CBC
HCT: 30.7 % — ABNORMAL LOW (ref 36.0–46.0)
Hemoglobin: 10.6 g/dL — ABNORMAL LOW (ref 12.0–15.0)
MCH: 31.5 pg (ref 26.0–34.0)
MCHC: 34.5 g/dL (ref 30.0–36.0)
MCV: 91.4 fL (ref 80.0–100.0)
Platelets: 223 10*3/uL (ref 150–400)
RBC: 3.36 MIL/uL — ABNORMAL LOW (ref 3.87–5.11)
RDW: 12 % (ref 11.5–15.5)
WBC: 3.1 10*3/uL — ABNORMAL LOW (ref 4.0–10.5)
nRBC: 0 % (ref 0.0–0.2)

## 2023-09-14 LAB — GLUCOSE, CAPILLARY: Glucose-Capillary: 114 mg/dL — ABNORMAL HIGH (ref 70–99)

## 2023-09-14 MED ORDER — POTASSIUM CHLORIDE CRYS ER 20 MEQ PO TBCR
60.0000 meq | EXTENDED_RELEASE_TABLET | Freq: Once | ORAL | Status: AC
  Administered 2023-09-14: 60 meq via ORAL
  Filled 2023-09-14: qty 3

## 2023-09-14 MED ORDER — DOXYCYCLINE HYCLATE 100 MG PO TABS
100.0000 mg | ORAL_TABLET | Freq: Two times a day (BID) | ORAL | 0 refills | Status: AC
  Filled 2023-09-14: qty 8, 4d supply, fill #0

## 2023-09-14 MED ORDER — ENOXAPARIN SODIUM 40 MG/0.4ML IJ SOSY: 40.0000 mg | PREFILLED_SYRINGE | INTRAMUSCULAR | Status: DC

## 2023-09-14 NOTE — Evaluation (Signed)
 Occupational Therapy Evaluation Patient Details Name: Adriana Jordan MRN: [id number] DOB: May 20, 1935 Today's Date: 09/14/2023   History of Present Illness 89 yo female presents to [facility] on 09/12/23 with increased lethargy, near syncope. Pt hypotensive and hypoxic with EMS. Found to be + covid.  PMH includes pituitary adenoma s/p surgical resection and radiation, recent concern for adrenal insufficiency, CVA, GERD, syncope, HTN, HLD, CAD with remote MI, CKD, right arm skin cancer, cervical spine DDD s/p ACDF.   Clinical Impression   Pt lives with son, reports she is ind with ADLs/IADLs and uses cane for mobility. Pt currently needing set up - mod A for ADLs, supervision for bed mobility and up to min A for transfers with RW. VSS on RA throughout session. Session limited due to R knee pain, pt reports she is supposed to have a knee replacement soon. Pt presenting with impairments listed below, will follow acutely. Recommend HHOT at d/c.       If plan is discharge home, recommend the following: A little help with walking and/or transfers;A lot of help with bathing/dressing/bathroom;Assistance with cooking/housework;Assist for transportation;Help with stairs or ramp for entrance    Functional Status Assessment  Patient has had a recent decline in their functional status and demonstrates the ability to make significant improvements in function in a reasonable and predictable amount of time.  Equipment Recommendations  Tub/shower seat    Recommendations for Other Services PT consult     Precautions / Restrictions Precautions Precautions: Fall Precaution Comments: pt reports that she slid out of her chair at home, urinated on herself, son could not get her up, and had decreased responsiveness while he was trying to help her up Restrictions Weight Bearing Restrictions Per Provider Order: No      Mobility Bed Mobility Overal bed mobility: Needs Assistance Bed Mobility: Supine to Sit      Supine to sit: Supervision, HOB elevated          Transfers Overall transfer level: Needs assistance Equipment used: Rolling walker (2 wheels) Transfers: Sit to/from Stand Sit to Stand: Min assist           General transfer comment: cues to push from bed      Balance Overall balance assessment: Needs assistance Sitting-balance support: Feet supported, No upper extremity supported Sitting balance-Leahy Scale: Good     Standing balance support: Bilateral upper extremity supported Standing balance-Leahy Scale: Poor Standing balance comment: reliant on RW support                           ADL either performed or assessed with clinical judgement   ADL Overall ADL's : Needs assistance/impaired Eating/Feeding: Set up;Sitting   Grooming: Set up;Standing   Upper Body Bathing: Minimal assistance   Lower Body Bathing: Moderate assistance   Upper Body Dressing : Minimal assistance   Lower Body Dressing: Moderate assistance   Toilet Transfer: Contact guard assist;Ambulation;Regular Toilet;Rolling walker (2 wheels)   Toileting- Clothing Manipulation and Hygiene: Contact guard assist       Functional mobility during ADLs: Contact guard assist;Rolling walker (2 wheels)       Vision   Vision Assessment?: No apparent visual deficits     Perception Perception: Not tested       Praxis Praxis: Not tested       Pertinent Vitals/Pain Pain Assessment Pain Assessment: Faces Pain Score: 4  Faces Pain Scale: Hurts little more Pain Location: R knee Pain Descriptors / Indicators:  Discomfort Pain Intervention(s): Limited activity within patient's tolerance, Monitored during session, Repositioned     Extremity/Trunk Assessment Upper Extremity Assessment Upper Extremity Assessment: Generalized weakness   Lower Extremity Assessment Lower Extremity Assessment: Defer to PT evaluation   Cervical / Trunk Assessment Cervical / Trunk Assessment: Kyphotic    Communication Communication Communication: No apparent difficulties   Cognition Arousal: Alert Behavior During Therapy: WFL for tasks assessed/performed Overall Cognitive Status: Within Functional Limits for tasks assessed                                       General Comments  VSS on RA thorughout    Exercises     Shoulder Instructions      Home Living Family/patient expects to be discharged to:: Private residence Living Arrangements: Children Available Help at Discharge: Family;Available 24 hours/day Type of Home: House Home Access: Stairs to enter [employer] of Steps: 3 Entrance Stairs-Rails: Right Home Layout: One level     Bathroom Shower/Tub: Producer, Television/film/video: Standard Bathroom Accessibility: Yes   Home Equipment: Agricultural Consultant (2 wheels);Cane - single point;BSC/3in1          Prior Functioning/Environment Prior Level of Function : Independent/Modified Independent;Driving             Mobility Comments: Patient reports she was independent prior to admission. Rarely uses RW, only for community ambulation. Able to perform household ambulation without cane or RW. ADLs Comments: Independent        OT Problem List: Decreased strength;Decreased range of motion;Decreased activity tolerance;Impaired balance (sitting and/or standing)      OT Treatment/Interventions: Self-care/ADL training;Therapeutic exercise;Energy conservation;DME and/or AE instruction;Therapeutic activities;Patient/family education;Balance training    OT Goals(Current goals can be found in the care plan section) Acute Rehab OT Goals Patient Stated Goal: none stated OT Goal Formulation: With patient Time For Goal Achievement: 09/28/23 Potential to Achieve Goals: Good ADL Goals Pt Will Perform Upper Body Dressing: with supervision;sitting Pt Will Perform Lower Body Dressing: with supervision;sitting/lateral leans;sit to/from stand Pt Will  Transfer to Toilet: with supervision;ambulating;regular height toilet Pt/caregiver will Perform Home Exercise Program: Increased ROM;Increased strength;Both right and left upper extremity;With written HEP provided;With Supervision Additional ADL Goal #1: Pt will tolerate OOB activity x10 min in order to improve activity tolerance for ADLs  OT Frequency: Min 1X/week    Co-evaluation              AM-PAC OT 6 Clicks Daily Activity     Outcome Measure Help from another person eating meals?: None Help from another person taking care of personal grooming?: A Little Help from another person toileting, which includes using toliet, bedpan, or urinal?: A Little Help from another person bathing (including washing, rinsing, drying)?: A Lot Help from another person to put on and taking off regular upper body clothing?: A Little Help from another person to put on and taking off regular lower body clothing?: A Lot 6 Click Score: 17   End of Session Equipment Utilized During Treatment: Gait belt;Rolling walker (2 wheels) Nurse Communication: Mobility status  Activity Tolerance: Patient tolerated treatment well Patient left: in bed;with call bell/phone within reach;with bed alarm set  OT Visit Diagnosis: Unsteadiness on feet (R26.81);Other abnormalities of gait and mobility (R26.89);Muscle weakness (generalized) (M62.81)                Time: 9248-9180 OT Time Calculation (min): 28 min Charges:  OT General Charges $OT Visit: 1 Visit OT Evaluation $OT Eval Moderate Complexity: 1 Mod OT Treatments $Self Care/Home Management : 8-22 mins  Edgardo Petrenko K, OTD, OTR/L SecureChat Preferred Acute Rehab [phone]   Javon Hupfer K Koonce 09/14/2023, 9:10 AM

## 2023-09-14 NOTE — Plan of Care (Signed)
  Problem: Education: Goal: Knowledge of risk factors and measures for prevention of condition will improve Outcome: Progressing   Problem: Coping: Goal: Psychosocial and spiritual needs will be supported Outcome: Progressing   Problem: Respiratory: Goal: Will maintain a patent airway Outcome: Progressing Goal: Complications related to the disease process, condition or treatment will be avoided or minimized Outcome: Progressing   Problem: Education: Goal: Knowledge of condition and prescribed therapy will improve Outcome: Progressing   Problem: Cardiac: Goal: Will achieve and/or maintain adequate cardiac output Outcome: Progressing   Problem: Physical Regulation: Goal: Complications related to the disease process, condition or treatment will be avoided or minimized Outcome: Progressing   Problem: Education: Goal: Knowledge of General Education information will improve Description: Including pain rating scale, medication(s)/side effects and non-pharmacologic comfort measures Outcome: Progressing   Problem: Health Behavior/Discharge Planning: Goal: Ability to manage health-related needs will improve Outcome: Progressing   Problem: Clinical Measurements: Goal: Ability to maintain clinical measurements within normal limits will improve Outcome: Progressing Goal: Will remain free from infection Outcome: Progressing Goal: Diagnostic test results will improve Outcome: Progressing Goal: Respiratory complications will improve Outcome: Progressing Goal: Cardiovascular complication will be avoided Outcome: Progressing   Problem: Activity: Goal: Risk for activity intolerance will decrease Outcome: Progressing   Problem: Nutrition: Goal: Adequate nutrition will be maintained Outcome: Progressing   Problem: Coping: Goal: Level of anxiety will decrease Outcome: Progressing   Problem: Elimination: Goal: Will not experience complications related to bowel motility Outcome:  Progressing Goal: Will not experience complications related to urinary retention Outcome: Progressing   Problem: Pain Managment: Goal: General experience of comfort will improve and/or be controlled Outcome: Progressing   Problem: Safety: Goal: Ability to remain free from injury will improve Outcome: Progressing   Problem: Skin Integrity: Goal: Risk for impaired skin integrity will decrease Outcome: Progressing

## 2023-09-14 NOTE — Progress Notes (Signed)
 SATURATION QUALIFICATIONS: (This note is used to comply with regulatory documentation for home oxygen)  Patient Saturations on Room Air at Rest = 96-97%  Patient Saturations on Room Air while Ambulating = 95-97%  Patient Saturations on 0 Liters of oxygen while Ambulating = n/a %  Please briefly explain why patient needs home oxygen:

## 2023-09-14 NOTE — TOC Progression Note (Signed)
 Transition of Care [facility]) - Progression Note    Patient Details  Name: Adriana Jordan MRN: [id number] Date of Birth: [birth year]  Transition of Care [facility]) CM/SW Contact  Rosaline JONELLE Joe, RN Phone Number: 09/14/2023, 2:01 PM  Clinical Narrative:    CM met with the patient at the bedside to discuss home health services.  The patient declined home health services and states that she is having her floors renovated in the home and does not want clinic staff in the home.  Tub shower bench offered to the patient but she declined.  Patient's son will provide transportation to home by car when patient is stable for discharge.  Patient is currently on room air and does not need oxygen for home.        Expected Discharge Plan and Services         Expected Discharge Date: 09/14/23                                     Social Determinants of Health (SDOH) Interventions SDOH Screenings   Food Insecurity: No Food Insecurity (09/12/2023)  Housing: Unknown (09/12/2023)  Transportation Needs: No Transportation Needs (09/12/2023)  Utilities: Not At Risk (09/12/2023)  Social Connections: Moderately Integrated (09/12/2023)  Tobacco Use: Medium Risk (09/12/2023)    Readmission Risk Interventions    02/15/2023    1:22 PM  Readmission Risk Prevention Plan  Post Dischage Appt Complete  Medication Screening Complete  Transportation Screening Complete

## 2023-09-14 NOTE — Progress Notes (Signed)
 Mobility Specialist: Progress Note   09/14/23 1456  Mobility  Activity Ambulated with assistance in hallway  Level of Assistance Contact guard assist, steadying assist  Assistive Device Front wheel walker  Distance Ambulated (ft) 250 ft  Activity Response Tolerated well  Mobility Referral Yes  Mobility visit 1 Mobility  Mobility Specialist Start Time (ACUTE ONLY) 1425  Mobility Specialist Stop Time (ACUTE ONLY) 1448  Mobility Specialist Time Calculation (min) (ACUTE ONLY) 23 min    Pt was agreeable to mobility session - received in bed. C/o R knee pain and BLE swelling. minA to boost to stand, CG throughout ambulation. Unable to get a good pulse ox reading but pt denies any dizziness, SOB, or lightheadedness on RA. Returned to room without fault. Left in bed with all needs met, call bell in reach.   Ileana Lute Mobility Specialist Please contact via SecureChat or Rehab office at [phone]

## 2023-09-14 NOTE — Hospital Course (Signed)
 89 y.o. female with medical history significant of hypertension, hyperlipidemia, GERD, CVA, syncope, CAD, pituitary adenoma, adrenal insufficiency presenting with near syncopal episode. Patient lives with her son.  He reports she had increased lethargy this morning.  She was in a recliner when he noted she appeared to pass out.  EMS was called and patient was found to be hypotensive and hypoxic and she improved on 15 L. Of note had a similar episode in December of last year with weakness, hypotension, hypoxia.  Blood pressure improved with IV fluids and patient completed a course of antibiotics for questionable pneumonia and was discharged home.  VQ scan negative at that time.

## 2023-09-14 NOTE — Progress Notes (Signed)
 DISCHARGE NOTE HOME BRYTNEY SOMES to be discharged Home per MD order. Discussed prescriptions and follow up appointments with the patient. Prescriptions given to patient; medication list explained in detail. Patient verbalized understanding.  Skin clean, dry and intact without evidence of skin break down, no evidence of skin tears noted. IV catheter discontinued intact. Site without signs and symptoms of complications. Dressing and pressure applied. Pt denies pain at the site currently. No complaints noted.  Patient free of lines, drains, and wounds.   An After Visit Summary (AVS) was printed and given to the patient. Patient escorted via wheelchair, and discharged home via private auto.  Peyton SHAUNNA Pepper, RN

## 2023-09-14 NOTE — Discharge Summary (Signed)
 Physician Discharge Summary   Patient: Adriana Jordan MRN: [id number] DOB: 03/10/1935  Admit date:     09/12/2023  Discharge date: 09/14/23  Discharge Physician: Garnette Pelt   PCP: [name], MD   Recommendations at discharge:    Follow up with PCP in 1-2 weeks Please follow BP, note ARB and spironolactone  were held because of ARF. Consider resuming once renal function allows Recommend recheck BMET in 1 week, focus on renal function  Discharge Diagnoses: Principal Problem:   Near syncope Active Problems:   Pituitary adenoma (HCC)   Hypertension   Hyperlipidemia   GERD (gastroesophageal reflux disease)   History of CVA (cerebrovascular accident)   Adrenal insufficiency (HCC)   CAD S/P percutaneous coronary angioplasty   Hypotension  Resolved Problems:   * No resolved hospital problems. *  Hospital Course: 89 y.o. female with medical history significant of hypertension, hyperlipidemia, GERD, CVA, syncope, CAD, pituitary adenoma, adrenal insufficiency presenting with near syncopal episode. Patient lives with her son.  He reports she had increased lethargy this morning.  She was in a recliner when he noted she appeared to pass out.  EMS was called and patient was found to be hypotensive and hypoxic and she improved on 15 L. Of note had a similar episode in December of last year with weakness, hypotension, hypoxia.  Blood pressure improved with IV fluids and patient completed a course of antibiotics for questionable pneumonia and was discharged home.  VQ scan negative at that time.   Assessment and Plan: COVID-19 COVID-19 PCR was noted to be positive.  Patient mentioned that her son has been sick recently.   Chest x-ray showed small left effusion and basilar atelectasis versus infiltrates. No patchy opacities noted. Patient however was noted to be hypoxic and requiring oxygen. No clear indication to use Remdesivir at this time.  Patient already on hydrocortisone  for adrenal  insufficiency.  Keep on current dose for now.   Continue to monitor.   Acute respiratory failure with hypoxia/possible pneumonia WBC is normal.  VQ scan was unremarkable.  Procalcitonin 0.64. Will initiate doxycycline . Weaned O2 to RA   Near syncope Could have been related to COVID-19.  D-dimer is elevated but VQ scan is negative.  Soft blood pressures noted.  Holding her antihypertensives. Recently done echocardiogram in December showed normal LVEF with grade 1 diastolic dysfunction.  No significant valvular disease was noted.   Hypotension Could be due to hypovolemia.  She was noted to be on losartan  and spironolactone  and torsemide  prior to admission.  These are on hold.  She is also noted to be on steroids for history of adrenal insufficiency.  She was given Solu-Cortef  x 1 in the ED. Blood pressures are low but she is asymptomatic.  Started back on her hydrocortisone  tablets.  No clear indication for stress dosing currently.  Continue to monitor closely. Continue to hold her antihypertensives. Given gentle hydration   Hyperlipidemia/coronary artery disease Has history of stenting. Cardiac status is stable.   History of stroke Stable.   Pituitary adenoma Status post surgical intervention and radiation.  Followed by Dr. [name].  Has appointment with them in the next few weeks.   Adrenal insufficiency On hydrocortisone  twice a day which is being continued.  Was given 1 dose of Solu-Cortef  in the ED.  No clear indication for stress dose steroids   Normocytic anemia No evidence for overt bleeding.   Elevated creatinine, chronic kidney disease, unspecified stage Creatinine is about the same as what it was  in December.  Slightly higher than what it was last summer.   -Cr did improve          Consultants:  Procedures performed:   Disposition: Home Diet recommendation:  Regular diet DISCHARGE MEDICATION: Allergies as of 09/14/2023       Reactions   Codeine Nausea And  Vomiting   Tolerates hydrocodone         Medication List     STOP taking these medications    HYDROcodone -acetaminophen  5-325 MG tablet Commonly known as: NORCO/VICODIN   losartan  50 MG tablet Commonly known as: COZAAR    spironolactone  25 MG tablet Commonly known as: ALDACTONE        TAKE these medications    aspirin  EC 81 MG tablet Take 1 tablet (81 mg total) by mouth daily. Swallow whole.   doxycycline  100 MG tablet Commonly known as: VIBRA -TABS Take 1 tablet (100 mg total) by mouth every 12 (twelve) hours for 4 days.   feeding supplement Liqd Take 237 mLs by mouth 2 (two) times daily between meals.   hydrocortisone  20 MG tablet Commonly known as: CORTEF  Take 1 tablet (20 mg total) by mouth every morning.   hydrocortisone  10 MG tablet Commonly known as: CORTEF  Take 1 tablet (10 mg total) by mouth every evening.   pantoprazole  40 MG tablet Commonly known as: PROTONIX  Take 40 mg by mouth daily as needed (heartburn).   potassium chloride  10 MEQ tablet Commonly known as: KLOR-CON  Take 10 mEq by mouth daily.   PreserVision AREDS 2 Caps Take 1 capsule by mouth 2 (two) times daily.   rosuvastatin  20 MG tablet Commonly known as: CRESTOR  Take 20 mg by mouth every evening.   torsemide  10 MG tablet Commonly known as: DEMADEX  Take 10 mg by mouth daily as needed (fluid, swelling).        Follow-up Information     [name], MD Follow up in 2 week(s).   Specialty: Cardiology Why: Hospital follow up Contact information: [id number] [place] [address] [phone]                Discharge Exam: Adriana Jordan   09/12/23 0854 09/13/23 0413 09/14/23 0500  Weight: 74.3 kg 74 kg 73.9 kg   General exam: Awake, laying in bed, in nad Respiratory system: Normal respiratory effort, no wheezing Cardiovascular system: regular rate, s1, s2 Gastrointestinal system: Soft, nondistended, positive BS Central nervous system: CN2-12 grossly  intact, strength intact Extremities: Perfused, no clubbing Skin: Normal skin turgor, no notable skin lesions seen Psychiatry: Mood normal // no visual hallucinations   Condition at discharge: fair  The results of significant diagnostics from this hospitalization (including imaging, microbiology, ancillary and laboratory) are listed below for reference.   Imaging Studies: NM Pulmonary Perfusion Result Date: 09/12/2023 CLINICAL DATA:  Pulmonary embolus suspected with low to intermediate probability. Positive D-dimer. Syncope. EXAM: NUCLEAR MEDICINE PERFUSION LUNG SCAN TECHNIQUE: Perfusion images were obtained in multiple projections after intravenous injection of radiopharmaceutical. Ventilation scans intentionally deferred if perfusion scan and chest x-ray adequate for interpretation during COVID 19 epidemic. RADIOPHARMACEUTICALS:  4.1 mCi Tc-7m MAA IV COMPARISON:  Chest radiograph 09/12/2023. Radionuclide perfusion scan 08/01/2023 FINDINGS: Normal homogeneous distribution of tracer activity demonstrated throughout both lungs. No perfusion defects are demonstrated. IMPRESSION: Normal examination.  No evidence of acute pulmonary embolus. Electronically Signed   By: Elsie Gravely M.D.   On: 09/12/2023 17:14   DG Chest Port 1 View Result Date: 09/12/2023 CLINICAL DATA:  Questionable sepsis EXAM: PORTABLE CHEST 1  VIEW COMPARISON:  None Available. FINDINGS: Normal cardiac silhouette. LEFT basilar atelectasis and small effusion. Upper lungs clear. Anterior cervical fusion IMPRESSION: Small LEFT effusion and basilar atelectasis versus infiltrate. Electronically Signed   By: Jackquline Boxer M.D.   On: 09/12/2023 10:21    Microbiology: Results for orders placed or performed during the hospital encounter of 09/12/23  Blood Culture (routine x 2)     Status: None (Preliminary result)   Collection Time: 09/12/23  9:00 AM   Specimen: BLOOD  Result Value Ref Range Status   Specimen Description BLOOD SITE  NOT SPECIFIED  Final   Special Requests   Final    BOTTLES DRAWN AEROBIC AND ANAEROBIC Blood Culture results may not be optimal due to an inadequate volume of blood received in culture bottles   Culture   Final    NO GROWTH 2 DAYS Performed at [facility], [address] [place], [place] [address]    Report Status PENDING  Incomplete  Resp panel by RT-PCR (RSV, Flu A&B, Covid) Peripheral     Status: Abnormal   Collection Time: 09/12/23  9:00 AM   Specimen: Peripheral; Nasal Swab  Result Value Ref Range Status   SARS Coronavirus 2 by RT PCR POSITIVE (A) NEGATIVE Final   Influenza A by PCR NEGATIVE NEGATIVE Final   Influenza B by PCR NEGATIVE NEGATIVE Final    Comment: (NOTE) The Xpert Xpress SARS-CoV-2/FLU/RSV plus assay is intended as an aid in the diagnosis of influenza from Nasopharyngeal swab specimens and should not be used as a sole basis for treatment. Nasal washings and aspirates are unacceptable for Xpert Xpress SARS-CoV-2/FLU/RSV testing.  Fact Sheet for Patients: bloggercourse.com  Fact Sheet for Healthcare Providers: seriousbroker.it  This test is not yet approved or cleared by the [place]  FDA and has been authorized for detection and/or diagnosis of SARS-CoV-2 by FDA under an Emergency Use Authorization (EUA). This EUA will remain in effect (meaning this test can be used) for the duration of the COVID-19 declaration under Section 564(b)(1) of the Act, 21 U.S.C. section 360bbb-3(b)(1), unless the authorization is terminated or revoked.     Resp Syncytial Virus by PCR NEGATIVE NEGATIVE Final    Comment: (NOTE) Fact Sheet for Patients: bloggercourse.com  Fact Sheet for Healthcare Providers: seriousbroker.it  This test is not yet approved or cleared by the [place]  FDA and has been authorized for detection and/or diagnosis of SARS-CoV-2 by FDA  under an Emergency Use Authorization (EUA). This EUA will remain in effect (meaning this test can be used) for the duration of the COVID-19 declaration under Section 564(b)(1) of the Act, 21 U.S.C. section 360bbb-3(b)(1), unless the authorization is terminated or revoked.  Performed at [facility], [address], [place], [place] [address]   Blood Culture (routine x 2)     Status: None (Preliminary result)   Collection Time: 09/12/23  9:05 AM   Specimen: BLOOD  Result Value Ref Range Status   Specimen Description BLOOD SITE NOT SPECIFIED  Final   Special Requests   Final    BOTTLES DRAWN AEROBIC AND ANAEROBIC Blood Culture results may not be optimal due to an inadequate volume of blood received in culture bottles   Culture   Final    NO GROWTH 2 DAYS Performed at [facility], [address] [place], [place] [address]    Report Status PENDING  Incomplete    Labs: CBC: Recent Labs  Lab 09/12/23 0910 09/13/23 0551 09/14/23  0651  WBC 8.1 4.2 3.1*  NEUTROABS 5.1  --   --   HGB 14.3 11.8* 10.6*  HCT 42.9 34.2* 30.7*  MCV 94.9 93.4 91.4  PLT 266 226 223   Basic Metabolic Panel: Recent Labs  Lab 09/12/23 1030 09/12/23 1208 09/13/23 0551 09/14/23 0651  NA 138 140 138 140  K 3.6 3.5 3.6 3.2*  CL 105 107 103 109  CO2 20* 20* 21* 20*  GLUCOSE 101* 85 113* 95  BUN 11 10 14 14   CREATININE 1.74* 1.73* 1.71* 1.36*  CALCIUM  8.9 8.7* 8.3* 8.4*   Liver Function Tests: Recent Labs  Lab 09/12/23 1030 09/12/23 1208 09/13/23 0551  AST 33 33 29  ALT 13 12 12   ALKPHOS 51 47 42  BILITOT 1.0 0.8 0.7  PROT 6.5 5.9* 5.7*  ALBUMIN  3.4* 3.1* 2.9*   CBG: Recent Labs  Lab 09/12/23 0855 09/13/23 0703 09/14/23 0426  GLUCAP 102* 100* 114*    Discharge time spent: less than 30 minutes.  Signed: Garnette Pelt, MD [facility] 09/14/2023

## 2023-09-17 LAB — CULTURE, BLOOD (ROUTINE X 2)
Culture: NO GROWTH
Culture: NO GROWTH

## 2023-09-27 ENCOUNTER — Inpatient Hospital Stay (HOSPITAL_COMMUNITY)
Admission: EM | Admit: 2023-09-27 | Discharge: 2023-09-30 | DRG: 193 | Disposition: A | Discharge status: Home Health | Payer: Medicare PPO | Attending: Internal Medicine | Admitting: Internal Medicine

## 2023-09-27 ENCOUNTER — Encounter (HOSPITAL_COMMUNITY): Payer: Self-pay

## 2023-09-27 ENCOUNTER — Emergency Department (HOSPITAL_COMMUNITY): Payer: Medicare PPO

## 2023-09-27 ENCOUNTER — Other Ambulatory Visit: Payer: Self-pay

## 2023-09-27 DIAGNOSIS — Z85828 Personal history of other malignant neoplasm of skin: Secondary | ICD-10-CM

## 2023-09-27 DIAGNOSIS — Z955 Presence of coronary angioplasty implant and graft: Secondary | ICD-10-CM

## 2023-09-27 DIAGNOSIS — I2489 Other forms of acute ischemic heart disease: Secondary | ICD-10-CM | POA: Diagnosis present

## 2023-09-27 DIAGNOSIS — J323 Chronic sphenoidal sinusitis: Secondary | ICD-10-CM | POA: Diagnosis present

## 2023-09-27 DIAGNOSIS — I959 Hypotension, unspecified: Secondary | ICD-10-CM | POA: Diagnosis present

## 2023-09-27 DIAGNOSIS — E2740 Unspecified adrenocortical insufficiency: Secondary | ICD-10-CM | POA: Diagnosis present

## 2023-09-27 DIAGNOSIS — K219 Gastro-esophageal reflux disease without esophagitis: Secondary | ICD-10-CM | POA: Diagnosis present

## 2023-09-27 DIAGNOSIS — M170 Bilateral primary osteoarthritis of knee: Secondary | ICD-10-CM | POA: Diagnosis present

## 2023-09-27 DIAGNOSIS — Z87891 Personal history of nicotine dependence: Secondary | ICD-10-CM

## 2023-09-27 DIAGNOSIS — Z7982 Long term (current) use of aspirin: Secondary | ICD-10-CM

## 2023-09-27 DIAGNOSIS — E876 Hypokalemia: Secondary | ICD-10-CM | POA: Diagnosis present

## 2023-09-27 DIAGNOSIS — Z981 Arthrodesis status: Secondary | ICD-10-CM

## 2023-09-27 DIAGNOSIS — N184 Chronic kidney disease, stage 4 (severe): Secondary | ICD-10-CM | POA: Diagnosis present

## 2023-09-27 DIAGNOSIS — Z7409 Other reduced mobility: Secondary | ICD-10-CM | POA: Diagnosis present

## 2023-09-27 DIAGNOSIS — G8929 Other chronic pain: Secondary | ICD-10-CM | POA: Diagnosis present

## 2023-09-27 DIAGNOSIS — Z79899 Other long term (current) drug therapy: Secondary | ICD-10-CM

## 2023-09-27 DIAGNOSIS — I252 Old myocardial infarction: Secondary | ICD-10-CM

## 2023-09-27 DIAGNOSIS — M545 Low back pain, unspecified: Secondary | ICD-10-CM | POA: Diagnosis present

## 2023-09-27 DIAGNOSIS — Z8673 Personal history of transient ischemic attack (TIA), and cerebral infarction without residual deficits: Secondary | ICD-10-CM

## 2023-09-27 DIAGNOSIS — Z885 Allergy status to narcotic agent status: Secondary | ICD-10-CM

## 2023-09-27 DIAGNOSIS — Z8616 Personal history of COVID-19: Secondary | ICD-10-CM

## 2023-09-27 DIAGNOSIS — Z801 Family history of malignant neoplasm of trachea, bronchus and lung: Secondary | ICD-10-CM

## 2023-09-27 DIAGNOSIS — J189 Pneumonia, unspecified organism: Principal | ICD-10-CM | POA: Diagnosis present

## 2023-09-27 DIAGNOSIS — I2510 Atherosclerotic heart disease of native coronary artery without angina pectoris: Secondary | ICD-10-CM | POA: Diagnosis present

## 2023-09-27 DIAGNOSIS — J9601 Acute respiratory failure with hypoxia: Secondary | ICD-10-CM | POA: Diagnosis present

## 2023-09-27 DIAGNOSIS — R0603 Acute respiratory distress: Principal | ICD-10-CM

## 2023-09-27 DIAGNOSIS — R531 Weakness: Secondary | ICD-10-CM | POA: Diagnosis not present

## 2023-09-27 DIAGNOSIS — R55 Syncope and collapse: Secondary | ICD-10-CM

## 2023-09-27 DIAGNOSIS — Z923 Personal history of irradiation: Secondary | ICD-10-CM

## 2023-09-27 DIAGNOSIS — N179 Acute kidney failure, unspecified: Secondary | ICD-10-CM | POA: Diagnosis present

## 2023-09-27 DIAGNOSIS — I129 Hypertensive chronic kidney disease with stage 1 through stage 4 chronic kidney disease, or unspecified chronic kidney disease: Secondary | ICD-10-CM | POA: Diagnosis present

## 2023-09-27 DIAGNOSIS — G9341 Metabolic encephalopathy: Secondary | ICD-10-CM | POA: Diagnosis present

## 2023-09-27 DIAGNOSIS — E7800 Pure hypercholesterolemia, unspecified: Secondary | ICD-10-CM | POA: Diagnosis present

## 2023-09-27 DIAGNOSIS — Z803 Family history of malignant neoplasm of breast: Secondary | ICD-10-CM

## 2023-09-27 LAB — I-STAT CG4 LACTIC ACID, ED: Lactic Acid, Venous: 1.5 mmol/L (ref 0.5–1.9)

## 2023-09-27 LAB — CBC WITH DIFFERENTIAL/PLATELET
Abs Immature Granulocytes: 0.01 10*3/uL (ref 0.00–0.07)
Basophils Absolute: 0 10*3/uL (ref 0.0–0.1)
Basophils Relative: 1 %
Eosinophils Absolute: 0.1 10*3/uL (ref 0.0–0.5)
Eosinophils Relative: 1 %
HCT: 40.6 % (ref 36.0–46.0)
Hemoglobin: 13.8 g/dL (ref 12.0–15.0)
Immature Granulocytes: 0 %
Lymphocytes Relative: 22 %
Lymphs Abs: 1.4 10*3/uL (ref 0.7–4.0)
MCH: 31.4 pg (ref 26.0–34.0)
MCHC: 34 g/dL (ref 30.0–36.0)
MCV: 92.3 fL (ref 80.0–100.0)
Monocytes Absolute: 0.6 10*3/uL (ref 0.1–1.0)
Monocytes Relative: 10 %
Neutro Abs: 4.2 10*3/uL (ref 1.7–7.7)
Neutrophils Relative %: 66 %
Platelets: 249 10*3/uL (ref 150–400)
RBC: 4.4 MIL/uL (ref 3.87–5.11)
RDW: 12.8 % (ref 11.5–15.5)
WBC: 6.4 10*3/uL (ref 4.0–10.5)
nRBC: 0 % (ref 0.0–0.2)

## 2023-09-27 LAB — COMPREHENSIVE METABOLIC PANEL
ALT: 11 U/L (ref 0–44)
AST: 29 U/L (ref 15–41)
Albumin: 3.3 g/dL — ABNORMAL LOW (ref 3.5–5.0)
Alkaline Phosphatase: 42 U/L (ref 38–126)
Anion gap: 11 (ref 5–15)
BUN: 8 mg/dL (ref 8–23)
CO2: 24 mmol/L (ref 22–32)
Calcium: 9.2 mg/dL (ref 8.9–10.3)
Chloride: 102 mmol/L (ref 98–111)
Creatinine, Ser: 1.71 mg/dL — ABNORMAL HIGH (ref 0.44–1.00)
GFR, Estimated: 29 mL/min — ABNORMAL LOW (ref >60)
Glucose, Bld: 120 mg/dL — ABNORMAL HIGH (ref 70–99)
Potassium: 3.4 mmol/L — ABNORMAL LOW (ref 3.5–5.1)
Sodium: 137 mmol/L (ref 135–145)
Total Bilirubin: 1.3 mg/dL — ABNORMAL HIGH (ref 0.0–1.2)
Total Protein: 6.3 g/dL — ABNORMAL LOW (ref 6.5–8.1)

## 2023-09-27 LAB — I-STAT VENOUS BLOOD GAS, ED
Acid-Base Excess: 1 mmol/L (ref 0.0–2.0)
Bicarbonate: 26.3 mmol/L (ref 20.0–28.0)
Calcium, Ion: 1.13 mmol/L — ABNORMAL LOW (ref 1.15–1.40)
HCT: 38 % (ref 36.0–46.0)
Hemoglobin: 12.9 g/dL (ref 12.0–15.0)
O2 Saturation: 45 %
Potassium: 3.9 mmol/L (ref 3.5–5.1)
Sodium: 138 mmol/L (ref 135–145)
TCO2: 28 mmol/L (ref 22–32)
pCO2, Ven: 42.4 mm[Hg] — ABNORMAL LOW (ref 44–60)
pH, Ven: 7.402 (ref 7.25–7.43)
pO2, Ven: 25 mm[Hg] — CL (ref 32–45)

## 2023-09-27 LAB — GLUCOSE, CAPILLARY: Glucose-Capillary: 69 mg/dL — ABNORMAL LOW (ref 70–99)

## 2023-09-27 LAB — TROPONIN I (HIGH SENSITIVITY): Troponin I (High Sensitivity): 18 ng/L — ABNORMAL HIGH (ref <18)

## 2023-09-27 LAB — CK: Total CK: 125 U/L (ref 38–234)

## 2023-09-27 LAB — RESP PANEL BY RT-PCR (RSV, FLU A&B, COVID)  RVPGX2
Influenza A by PCR: NEGATIVE
Influenza B by PCR: NEGATIVE
Resp Syncytial Virus by PCR: NEGATIVE
SARS Coronavirus 2 by RT PCR: POSITIVE — AB

## 2023-09-27 LAB — PROCALCITONIN: Procalcitonin: 0.26 ng/mL

## 2023-09-27 LAB — BRAIN NATRIURETIC PEPTIDE: B Natriuretic Peptide: 50.7 pg/mL (ref 0.0–100.0)

## 2023-09-27 LAB — AMMONIA: Ammonia: 17 umol/L (ref 9–35)

## 2023-09-27 MED ORDER — ACETAMINOPHEN 650 MG RE SUPP: 650.0000 mg | Freq: Four times a day (QID) | RECTAL | Status: DC | PRN

## 2023-09-27 MED ORDER — OXYCODONE HCL 5 MG PO TABS
5.0000 mg | ORAL_TABLET | ORAL | Status: DC | PRN
  Administered 2023-09-27 – 2023-09-30 (×7): 5 mg via ORAL
  Filled 2023-09-27 (×7): qty 1

## 2023-09-27 MED ORDER — POLYETHYLENE GLYCOL 3350 17 G PO PACK: 17.0000 g | PACK | Freq: Every day | ORAL | Status: DC | PRN

## 2023-09-27 MED ORDER — ROSUVASTATIN CALCIUM 20 MG PO TABS
20.0000 mg | ORAL_TABLET | Freq: Every evening | ORAL | Status: DC
  Administered 2023-09-28 – 2023-09-29 (×2): 20 mg via ORAL
  Filled 2023-09-27 (×3): qty 1

## 2023-09-27 MED ORDER — HYDRALAZINE HCL 20 MG/ML IJ SOLN: 10.0000 mg | Freq: Four times a day (QID) | INTRAMUSCULAR | Status: DC | PRN

## 2023-09-27 MED ORDER — SODIUM CHLORIDE 0.9 % IV BOLUS
500.0000 mL | Freq: Once | INTRAVENOUS | Status: AC
  Administered 2023-09-27: 500 mL via INTRAVENOUS

## 2023-09-27 MED ORDER — PANTOPRAZOLE SODIUM 40 MG PO TBEC: 40.0000 mg | DELAYED_RELEASE_TABLET | Freq: Every day | ORAL | Status: DC | PRN

## 2023-09-27 MED ORDER — ACETAMINOPHEN 325 MG PO TABS
650.0000 mg | ORAL_TABLET | Freq: Four times a day (QID) | ORAL | Status: DC | PRN
  Administered 2023-09-28 – 2023-09-30 (×4): 650 mg via ORAL
  Filled 2023-09-27 (×4): qty 2

## 2023-09-27 MED ORDER — DOXYCYCLINE HYCLATE 100 MG PO TABS
100.0000 mg | ORAL_TABLET | Freq: Two times a day (BID) | ORAL | Status: DC
  Administered 2023-09-27 – 2023-09-30 (×6): 100 mg via ORAL
  Filled 2023-09-27 (×6): qty 1

## 2023-09-27 MED ORDER — SODIUM CHLORIDE 0.9 % IV SOLN
1.0000 g | Freq: Once | INTRAVENOUS | Status: AC
  Administered 2023-09-27: 1 g via INTRAVENOUS
  Filled 2023-09-27: qty 10

## 2023-09-27 MED ORDER — HYDROCORTISONE 20 MG PO TABS
20.0000 mg | ORAL_TABLET | ORAL | Status: DC
  Administered 2023-09-28 – 2023-09-30 (×3): 20 mg via ORAL
  Filled 2023-09-27 (×4): qty 1

## 2023-09-27 MED ORDER — HYDROCORTISONE 10 MG PO TABS
10.0000 mg | ORAL_TABLET | Freq: Every evening | ORAL | Status: DC
  Administered 2023-09-27 – 2023-09-29 (×3): 10 mg via ORAL
  Filled 2023-09-27 (×4): qty 1

## 2023-09-27 MED ORDER — ENOXAPARIN SODIUM 30 MG/0.3ML IJ SOSY
30.0000 mg | PREFILLED_SYRINGE | INTRAMUSCULAR | Status: DC
  Administered 2023-09-27 – 2023-09-29 (×3): 30 mg via SUBCUTANEOUS
  Filled 2023-09-27 (×3): qty 0.3

## 2023-09-27 MED ORDER — ALBUTEROL SULFATE (2.5 MG/3ML) 0.083% IN NEBU: 2.5000 mg | INHALATION_SOLUTION | Freq: Four times a day (QID) | RESPIRATORY_TRACT | Status: DC | PRN

## 2023-09-27 MED ORDER — SODIUM CHLORIDE 0.9 % IV BOLUS
1000.0000 mL | Freq: Once | INTRAVENOUS | Status: AC
  Administered 2023-09-27: 1000 mL via INTRAVENOUS

## 2023-09-27 MED ORDER — SODIUM CHLORIDE 0.9 % IV SOLN
500.0000 mg | Freq: Once | INTRAVENOUS | Status: AC
  Administered 2023-09-27: 500 mg via INTRAVENOUS
  Filled 2023-09-27: qty 5

## 2023-09-27 MED ORDER — BISACODYL 5 MG PO TBEC: 5.0000 mg | DELAYED_RELEASE_TABLET | Freq: Every day | ORAL | Status: DC | PRN

## 2023-09-27 MED ORDER — SODIUM CHLORIDE 0.9 % IV SOLN
1.0000 g | INTRAVENOUS | Status: DC
Start: 2023-09-28 — End: 2023-09-30
  Administered 2023-09-28 – 2023-09-29 (×2): 1 g via INTRAVENOUS
  Filled 2023-09-27 (×2): qty 10

## 2023-09-27 MED ORDER — ASPIRIN 81 MG PO TBEC
81.0000 mg | DELAYED_RELEASE_TABLET | Freq: Every day | ORAL | Status: DC
  Administered 2023-09-28 – 2023-09-30 (×3): 81 mg via ORAL
  Filled 2023-09-27 (×3): qty 1

## 2023-09-27 NOTE — ED Triage Notes (Signed)
Pt arrived via [facility] from home. Family checked on pt and she was found to be lethargic, AMS and hypoxic. Per EMS, Sa02 was 70's RA. Pt arrived on NRB. Pt is OX4

## 2023-09-27 NOTE — H&P (Signed)
[facility] History and Physical  Adriana Jordan WUJ:[id number] DOB: [birth year] DOA: 09/27/2023 PCP: Orpah Cobb, MD  Presented from: home Chief Complaint: weakness  History of Present Illness: Adriana Jordan is a 89 y.o. female with PMH significant for HTN, HLD, CAD, CVA, syncope, GERD, pituitary adenoma s/p surgical intervention and radiation, adrenal insufficiency on hydrocortisone. This morning, patient was brought to the ED from home by EMS for lethargy, altered mental status, hypoxia to 70s.  Most recently hospitalized 2/3 to 2/5 for near syncopal episode, lethargy.Marland Kitchen  She was noted to be COVID PCR positive.  COVID improved with supportive care.  Because of hypotension and AKI- losartan, Aldactone and torsemide were held.  Patient was discharged home to follow-up with PCP as an outpatient.  Patient lives at home with her son.  Patient reports that at baseline, she is able to ambulate, gets limited because of osteoarthritis of both knees.  Since last hospitalization, she has been less mobile and progressively weak.  Reports compliance to her medications. This morning, patient felt weak and could not get out of her bed.  Her son called EMS noted her to be having difficulty breathing, reduced responsiveness and called EMS. On EMS arrival, her O2 sat was low in 70s.  With nonrebreather mask, oxygen saturation and mental status gradually improved.  In the ED, patient was afebrile, heart rate in 80s, blood pressure in 90s and low 100s, initially breathing on nonrebreather mask.  Currently on 4 L nasal cannula Initial labs with CBC unremarkable, BMP with potassium low at 3.4, BUN/creatinine elevated to 8/1.71, ammonia normal at 17. VBG with pH 7.4, pCO2 42, bicarb 26 Respiratory virus panel positive for COVID PCR CT chest showed mild lower lobe dependent partial consolidations, possible atelectasis, pneumonia or aspiration.   CT head  1. No evidence of an acute intracranial  abnormality. 2. Parenchymal atrophy and chronic small vessel ischemic disease 3. Paranasal sinus disease (including severe chronic right sphenoid sinusitis).   CT cervical spine 1. No evidence of an acute cervical spine fracture. 2. 2 mm C7-T1 grade 1 anterolisthesis.  Hospitalist service was consulted for inpatient management  At the time of my evaluation, patient was lying down on bed.  On 4 L oxygen by nasal cannula. No family at bedside.  Review of Systems:  All systems were reviewed and were negative unless otherwise mentioned in the HPI   Past medical history: Past Medical History:  Diagnosis Date   Acute respiratory failure with hypoxia (HCC) 11/15/2017   AKI (acute kidney injury) (HCC) 11/15/2017   Arthritis    "all over" (11/14/2017)   Brain tumor (HCC)    "I still have it"; not cancer (11/14/2017)   Chest pain, atypical 09/11/2021   Chronic lower back pain    Community acquired pneumonia 02/14/2023   Coronary artery disease    Dr. Algie Coffer   Elevated CK 11/15/2017   GERD (gastroesophageal reflux disease)    History of blood transfusion    "don't remember why" (11/14/2017)   History of radiation therapy 07/03/2018   brain, pituitary/ 12.5 Gy in 1 fraction   Hypercholesteremia    Hypertension    Influenza A 11/14/2017   MI (myocardial infarction) (HCC) 2006   PONV (postoperative nausea and vomiting)    Skin cancer    "right forearm"    Past surgical history: Past Surgical History:  Procedure Laterality Date   ANTERIOR CERVICAL DECOMP/DISCECTOMY FUSION     BACK SURGERY     CARPAL TUNNEL RELEASE  Bilateral    CORONARY ANGIOPLASTY     DES mid LAD and mid RCA 09/21/04   CRANIOTOMY N/A 05/21/2015   Procedure: Transsphenoidal resection of pituitary tumor with Dr. Narda Bonds for approach;  Surgeon: Hilda Lias, MD;  Location: [facility] NEURO ORS;  Service: Neurosurgery;  Laterality: N/A;  Transsphenoidal resection of pituitary tumor with Dr. Narda Bonds for approach    SHOULDER OPEN ROTATOR CUFF REPAIR Bilateral    SKIN CANCER EXCISION Right    forearm   thumb surgery Right    placed srews to make straight   THYROID SURGERY     "goiter removed"    Social History:  reports that she quit smoking about 54 years ago. Her smoking use included cigarettes. She started smoking about 59 years ago. She has a 0.6 pack-year smoking history. She has never used smokeless tobacco. She reports that she does not currently use alcohol. She reports that she does not use drugs.  Allergies:  Allergies  Allergen Reactions   Codeine Nausea And Vomiting    Tolerates hydrocodone   Codeine   Family history:  Family History  Problem Relation Age of Onset   Cancer Father 84       throat- smoker    Breast cancer Sister 51   Cancer Brother 25       lung- smoker     Physical Exam: Vitals:   09/27/23 1448 09/27/23 1633 09/27/23 1637 09/27/23 1726  BP: (!) 98/57 101/63    Pulse: 78     Resp: 17 18    Temp:  97.7 F (36.5 C)    TempSrc:  Oral    SpO2: 92% 97% 96%   Weight:    73 kg  Height:       Wt Readings from Last 3 Encounters:  09/27/23 73 kg  09/14/23 73.9 kg  08/01/23 74.3 kg   Body mass index is 25.98 kg/m.  General exam: Pleasant, elderly Caucasian female.  Feels weak Skin: No rashes, lesions or ulcers. HEENT: Atraumatic, normocephalic, no obvious bleeding Lungs: Clear to auscultation bilaterally,  CVS: S1, S2, no murmur,   GI/Abd: Soft, nontender, nondistended, bowel sound present,   CNS: Slow to respond but alert, awake, oriented x 3 Psychiatry: Sad affect Extremities: No pedal edema, no calf tenderness,     ----------------------------------------------------------------------------------------------------------------------------------------- ----------------------------------------------------------------------------------------------------------------------------------------- -----------------------------------------------------------------------------------------------------------------------------------------  Assessment/Plan: Principal Problem:   Pneumonia  Acute respiratory failure with hypoxia Bibasilar pneumonia Per EMS O2 sat was low at 70s with them. COVID-positive but she has had positive test 3 weeks ago as well CT chest with bibasilar haziness. No fever.  WBC count and lactic acid normal. Obtain procalcitonin level Given the severity of her symptoms, she was given IV Rocephin and azithromycin empirically. Continue IV Rocephin.  Switch azithromycin to oral doxycycline QTc prolongation to 503 ms Recent Labs  Lab 09/27/23 1042 09/27/23 1337  WBC 6.4  --   LATICACIDVEN  --  1.5   Acute metabolic encephalopathy Likely secondary to hypoxia Continue to monitor CT head without any acute abnormality.  Showed parenchymal atrophy and chronic small vessel disease  Hypotension Blood pressure in low 100s and 90s.  Keep torsemide on hold.  Adrenal insufficiency On hydrocortisone 20 mg a.m. and 10 mg p.m.  Continue the same.  CAD CVA/HLD PTA meds- aspirin 81 mg daily, Crestor 20 mg daily,  Severe chronic right sphenoid sinusitis No signs tenderness. Monitor  Impaired mobility Progressive generalized weakness Patient lives at home with her son.  Patient reports that at  baseline, she is able to ambulate, gets limited because of osteoarthritis of both knees.  Since last hospitalization, she has been less mobile and progressively weak. PT eval requested  Home med list has not been obtained/updated by [facility] at the time of admission.  Please double check admission  reconciliation.   Goals of care:   Code Status: Full Code    DVT prophylaxis:  enoxaparin (LOVENOX) injection 30 mg Start: 09/27/23 1600   Antimicrobials: IV Rocephin, oral doxycycline Fluid: None Consultants: None Family Communication: None at bedside  Dispo: The patient is from: Home              Anticipated d/c is to: Pending clinical course  Diet: Diet Order             Diet Heart Room service appropriate? Yes; Fluid consistency: Thin  Diet effective now                    ------------------------------------------------------------------------------------- Severity of Illness: The appropriate patient status for this patient is OBSERVATION. Observation status is judged to be reasonable and necessary in order to provide the required intensity of service to ensure the patient's safety. The patient's presenting symptoms, physical exam findings, and initial radiographic and laboratory data in the context of their medical condition is felt to place them at decreased risk for further clinical deterioration. Furthermore, it is anticipated that the patient will be medically stable for discharge from the hospital within 2 midnights of admission.  -------------------------------------------------------------------------------------   Home Meds: Prior to Admission medications   Medication Sig Start Date End Date Taking? Authorizing Provider  aspirin EC 81 MG tablet Take 1 tablet (81 mg total) by mouth daily. Swallow whole. 08/02/23   Orpah Cobb, MD  feeding supplement (ENSURE ENLIVE / ENSURE PLUS) LIQD Take 237 mLs by mouth 2 (two) times daily between meals. Patient taking differently: Take 237 mLs by mouth 2 (two) times daily as needed (skipped meal). 02/20/23   Rinaldo Cloud, MD  hydrocortisone (CORTEF) 10 MG tablet Take 1 tablet (10 mg total) by mouth every evening. 06/23/22   Hongalgi, Maximino Greenland, MD  hydrocortisone (CORTEF) 20 MG tablet Take 1 tablet (20 mg total) by mouth  every morning. 03/06/22   Regalado, Belkys A, MD  Multiple Vitamins-Minerals (PRESERVISION AREDS 2) CAPS Take 1 capsule by mouth 2 (two) times daily.    [provider]  pantoprazole (PROTONIX) 40 MG tablet Take 40 mg by mouth daily as needed (heartburn).    [provider]  potassium chloride (KLOR-CON) 10 MEQ tablet Take 10 mEq by mouth daily.    [provider]  rosuvastatin (CRESTOR) 20 MG tablet Take 20 mg by mouth every evening. 10/15/22   [provider]  torsemide (DEMADEX) 10 MG tablet Take 10 mg by mouth daily as needed (fluid, swelling).    [provider]    Labs on Admission:   CBC: Recent Labs  Lab 09/27/23 1042 09/27/23 1337  WBC 6.4  --   NEUTROABS 4.2  --   HGB 13.8 12.9  HCT 40.6 38.0  MCV 92.3  --   PLT 249  --     Basic Metabolic Panel: Recent Labs  Lab 09/27/23 1042 09/27/23 1337  NA 137 138  K 3.4* 3.9  CL 102  --   CO2 24  --   GLUCOSE 120*  --   BUN 8  --   CREATININE 1.71*  --   CALCIUM 9.2  --  Liver Function Tests: Recent Labs  Lab 09/27/23 1042  AST 29  ALT 11  ALKPHOS 42  BILITOT 1.3*  PROT 6.3*  ALBUMIN 3.3*   No results for input(s): "LIPASE", "AMYLASE" in the last 168 hours. Recent Labs  Lab 09/27/23 1122  AMMONIA 17    Cardiac Enzymes: Recent Labs  Lab 09/27/23 1042  CKTOTAL 125    BNP (last 3 results) Recent Labs    02/14/23 1559 07/30/23 1158 09/27/23 1042  BNP 22.2 22.4 50.7    ProBNP (last 3 results) No results for input(s): "PROBNP" in the last 8760 hours.  CBG: No results for input(s): "GLUCAP" in the last 168 hours.  Lipase     Component Value Date/Time   LIPASE 49 02/21/2022 1010     Urinalysis    Component Value Date/Time   COLORURINE YELLOW 09/12/2023 0911   APPEARANCEUR CLEAR 09/12/2023 0911   LABSPEC 1.013 09/12/2023 0911   PHURINE 6.0 09/12/2023 0911   GLUCOSEU NEGATIVE 09/12/2023 0911   HGBUR NEGATIVE 09/12/2023 0911   BILIRUBINUR  NEGATIVE 09/12/2023 0911   KETONESUR NEGATIVE 09/12/2023 0911   PROTEINUR NEGATIVE 09/12/2023 0911   UROBILINOGEN 0.2 03/01/2015 2045   NITRITE NEGATIVE 09/12/2023 0911   LEUKOCYTESUR NEGATIVE 09/12/2023 0911     Drugs of Abuse  No results found for: "LABOPIA", "COCAINSCRNUR", "LABBENZ", "AMPHETMU", "THCU", "LABBARB"    Radiological Exams on Admission: CT Chest Wo Contrast Result Date: 09/27/2023 CLINICAL DATA:  Respiratory failure trauma EXAM: CT CHEST WITHOUT CONTRAST TECHNIQUE: Multidetector CT imaging of the chest was performed following the standard protocol without IV contrast. RADIATION DOSE REDUCTION: This exam was performed according to the departmental dose-optimization program which includes automated exposure control, adjustment of the mA and/or kV according to patient size and/or use of iterative reconstruction technique. COMPARISON:  Chest x-ray 09/27/2023, CT chest 07/30/2023 FINDINGS: Cardiovascular: Limited evaluation without intravenous contrast. Moderate aortic atherosclerosis. No aneurysm. Coronary vascular calcification. Normal cardiac size. No significant pericardial effusion Mediastinum/Nodes: Patent trachea. No thyroid mass. No suspicious lymph nodes. Esophagus within normal limits Lungs/Pleura: No pleural effusion or pneumothorax. Mild lower lobe dependent partial consolidations. Upper Abdomen: No acute finding Musculoskeletal: No acute osseous abnormality IMPRESSION: 1. Mild lower lobe dependent partial consolidations, possible atelectasis, pneumonia or aspiration. 2. Aortic atherosclerosis. Aortic Atherosclerosis (ICD10-I70.0). Electronically Signed   By: Jasmine Pang M.D.   On: 09/27/2023 15:51   CT Head Wo Contrast Result Date: 09/27/2023 CLINICAL DATA:  Provided history: Delirium, possible trauma. Neck trauma. EXAM: CT HEAD WITHOUT CONTRAST CT CERVICAL SPINE WITHOUT CONTRAST TECHNIQUE: Multidetector CT imaging of the head and cervical spine was performed following the  standard protocol without intravenous contrast. Multiplanar CT image reconstructions of the cervical spine were also generated. RADIATION DOSE REDUCTION: This exam was performed according to the departmental dose-optimization program which includes automated exposure control, adjustment of the mA and/or kV according to patient size and/or use of iterative reconstruction technique. COMPARISON:  Head CT 07/30/2023. Brain MRI 06/21/2022. Cervical spine radiographs 09/13/2008. FINDINGS: CT HEAD FINDINGS Brain: Generalized parenchymal atrophy. Chronic lacunar infarct again noted within the left retrolenticular white matter. Patchy and ill-defined hypoattenuation elsewhere within the cerebral white matter, nonspecific but compatible with moderate chronic small vessel ischemic disease. There is no acute intracranial hemorrhage. No demarcated cortical infarct. No extra-axial fluid collection. No evidence of an intracranial mass. No midline shift. Vascular: No hyperdense vessel. Atherosclerotic calcifications. Skull: No calvarial fracture or aggressive osseous lesion. Sinuses/Orbits: No mass or acute finding within the imaged orbits. Mild mucosal  thickening within the bilateral maxillary sinuses. Complete opacification of the right sphenoid sinus (with associated chronic reactive osteitis). CT CERVICAL SPINE FINDINGS Alignment: 2 mm C7-T1 grade 1 anterolisthesis. Skull base and vertebrae: The basion-dental and atlanto-dental intervals are maintained.No evidence of acute fracture to the cervical spine. Prior C3-C6 ACDF. No evidence of acute hardware compromise. Soft tissues and spinal canal: No prevertebral fluid or swelling. No visible canal hematoma. Disc levels: Prior C3-C6 ACDF. Disc degeneration at the non-operative levels, greatest at C6-C7 (advanced at this level). Small C2-C3 central disc protrusion. Posterior disc osteophyte complex at C6-C7. Multilevel uncovertebral hypertrophy. No appreciable high-grade spinal  canal stenosis. Multilevel bony neural foraminal narrowing. Degenerative changes also present at the C1-C2 articulation. Upper chest: Minimal atelectasis within the dependent aspect of the left upper lobe at the imaged levels. No visible pneumothorax. IMPRESSION: CT head: 1.  No evidence of an acute intracranial abnormality. 2. Parenchymal atrophy and chronic small vessel ischemic disease, as described. 3. Paranasal sinus disease (including severe chronic right sphenoid sinusitis). CT cervical spine: 1. No evidence of an acute cervical spine fracture. 2. 2 mm C7-T1 grade 1 anterolisthesis. 3. Cervical spondylosis and post-operative changes, as described. Electronically Signed   By: Jackey Loge D.O.   On: 09/27/2023 14:41   CT Cervical Spine Wo Contrast Result Date: 09/27/2023 CLINICAL DATA:  Provided history: Delirium, possible trauma. Neck trauma. EXAM: CT HEAD WITHOUT CONTRAST CT CERVICAL SPINE WITHOUT CONTRAST TECHNIQUE: Multidetector CT imaging of the head and cervical spine was performed following the standard protocol without intravenous contrast. Multiplanar CT image reconstructions of the cervical spine were also generated. RADIATION DOSE REDUCTION: This exam was performed according to the departmental dose-optimization program which includes automated exposure control, adjustment of the mA and/or kV according to patient size and/or use of iterative reconstruction technique. COMPARISON:  Head CT 07/30/2023. Brain MRI 06/21/2022. Cervical spine radiographs 09/13/2008. FINDINGS: CT HEAD FINDINGS Brain: Generalized parenchymal atrophy. Chronic lacunar infarct again noted within the left retrolenticular white matter. Patchy and ill-defined hypoattenuation elsewhere within the cerebral white matter, nonspecific but compatible with moderate chronic small vessel ischemic disease. There is no acute intracranial hemorrhage. No demarcated cortical infarct. No extra-axial fluid collection. No evidence of an  intracranial mass. No midline shift. Vascular: No hyperdense vessel. Atherosclerotic calcifications. Skull: No calvarial fracture or aggressive osseous lesion. Sinuses/Orbits: No mass or acute finding within the imaged orbits. Mild mucosal thickening within the bilateral maxillary sinuses. Complete opacification of the right sphenoid sinus (with associated chronic reactive osteitis). CT CERVICAL SPINE FINDINGS Alignment: 2 mm C7-T1 grade 1 anterolisthesis. Skull base and vertebrae: The basion-dental and atlanto-dental intervals are maintained.No evidence of acute fracture to the cervical spine. Prior C3-C6 ACDF. No evidence of acute hardware compromise. Soft tissues and spinal canal: No prevertebral fluid or swelling. No visible canal hematoma. Disc levels: Prior C3-C6 ACDF. Disc degeneration at the non-operative levels, greatest at C6-C7 (advanced at this level). Small C2-C3 central disc protrusion. Posterior disc osteophyte complex at C6-C7. Multilevel uncovertebral hypertrophy. No appreciable high-grade spinal canal stenosis. Multilevel bony neural foraminal narrowing. Degenerative changes also present at the C1-C2 articulation. Upper chest: Minimal atelectasis within the dependent aspect of the left upper lobe at the imaged levels. No visible pneumothorax. IMPRESSION: CT head: 1.  No evidence of an acute intracranial abnormality. 2. Parenchymal atrophy and chronic small vessel ischemic disease, as described. 3. Paranasal sinus disease (including severe chronic right sphenoid sinusitis). CT cervical spine: 1. No evidence of an acute cervical spine fracture. 2.  2 mm C7-T1 grade 1 anterolisthesis. 3. Cervical spondylosis and post-operative changes, as described. Electronically Signed   By: Jackey Loge D.O.   On: 09/27/2023 14:41   DG Shoulder Left Result Date: 09/27/2023 CLINICAL DATA:  Pain. EXAM: LEFT SHOULDER - 2+ VIEW COMPARISON:  None Available. FINDINGS: There is no evidence of acute fracture or  dislocation. Mild degenerative changes of the glenohumeral and acromioclavicular joints. Soft tissues are unremarkable. IMPRESSION: 1. No acute osseous abnormality. 2. Mild degenerative changes of the glenohumeral and acromioclavicular joints. Electronically Signed   By: Hart Robinsons M.D.   On: 09/27/2023 13:56   DG Chest Port 1 View Result Date: 09/27/2023 CLINICAL DATA:  Altered mental status and hypoxia. EXAM: PORTABLE CHEST 1 VIEW COMPARISON:  Chest radiograph dated 09/12/2023. FINDINGS: The heart size and mediastinal contours are unchanged. Aortic atherosclerosis. Improved aeration at the left lung base. Streaky opacities at the medial right lung base. No sizable pleural effusion. No pneumothorax. Anterior lower cervical fusion hardware in place. Suture anchor in the right humeral head. No acute osseous abnormality. IMPRESSION: 1. Streaky opacities at the right lung base could reflect atelectasis or infiltrate. 2. Improved aeration at the left lung base. Electronically Signed   By: Hart Robinsons M.D.   On: 09/27/2023 13:54     Signed, Lorin Glass, MD [facility] 09/27/2023

## 2023-09-27 NOTE — ED Provider Notes (Addendum)
[place] [facility]GENC[facility] DEPARTMENT AT [facility] Provider Note  CSN: [id number] Arrival date & time: 09/27/23 1022  Chief Complaint(s) Altered Mental Status and hypoxic  HPI Adriana Jordan is a 89 y.o. female with past medical history as below, significant for recent COVID-19, CAD, chronic back pain, HLD, HTN, malnutrition who presents to the ED with complaint of hypoxia, AMS  Patient lives alone, family would go check on her this morning and she was having difficulty breathing, reduced responsiveness.  On EMS arrival her pulse ox was in the low 70s.  Respiratory distress.  This did improve with nonrebreather.  Mental status greatly improved nonrebreather, she is able answer questions appropriately but is very tired.  Reports difficulty breathing over the past few days, worsening.  Details of this morning are "fuzzy" but she does not believe that she fell down.  Recently hospitalized earlier this month with similar presenting symptoms.  She was diagnosed with COVID-19, pneumonia, sent home on Doxy, hypotension, near syncope  Past Medical History Past Medical History:  Diagnosis Date   Acute respiratory failure with hypoxia (HCC) 11/15/2017   AKI (acute kidney injury) (HCC) 11/15/2017   Arthritis    "all over" (11/14/2017)   Brain tumor (HCC)    "I still have it"; not cancer (11/14/2017)   Chest pain, atypical 09/11/2021   Chronic lower back pain    Community acquired pneumonia 02/14/2023   Coronary artery disease    Dr. Algie Coffer   Elevated CK 11/15/2017   GERD (gastroesophageal reflux disease)    History of blood transfusion    "don't remember why" (11/14/2017)   History of radiation therapy 07/03/2018   brain, pituitary/ 12.5 Gy in 1 fraction   Hypercholesteremia    Hypertension    Influenza A 11/14/2017   MI (myocardial infarction) (HCC) 2006   PONV (postoperative nausea and vomiting)    Skin cancer    "right forearm"   Patient Active Problem List   Diagnosis Date  Noted   Pneumonia 09/27/2023   CAD S/P percutaneous coronary angioplasty 09/12/2023   Hypotension 09/12/2023   Near syncope 09/12/2023   Syncope 07/30/2023   Headache 06/21/2022   Adrenal insufficiency (HCC) 03/05/2022   Protein-calorie malnutrition, severe 03/03/2022   Hypokalemia 03/01/2022   Generalized weakness 03/01/2022   Protein-calorie malnutrition, moderate (HCC) 03/01/2022   Multiple falls 03/01/2022   Tick bite 03/01/2022   Hyponatremia 02/21/2022   History of CVA (cerebrovascular accident) 10/11/2019   Leukopenia 11/15/2017   Hypertension 11/14/2017   Hyperlipidemia 11/14/2017   GERD (gastroesophageal reflux disease) 11/14/2017   Pituitary adenoma (HCC) 04/07/2015   Home Medication(s) Prior to Admission medications   Medication Sig Start Date End Date Taking? Authorizing Provider  aspirin EC 81 MG tablet Take 1 tablet (81 mg total) by mouth daily. Swallow whole. 08/02/23   Orpah Cobb, MD  feeding supplement (ENSURE ENLIVE / ENSURE PLUS) LIQD Take 237 mLs by mouth 2 (two) times daily between meals. Patient taking differently: Take 237 mLs by mouth 2 (two) times daily as needed (skipped meal). 02/20/23   Rinaldo Cloud, MD  hydrocortisone (CORTEF) 10 MG tablet Take 1 tablet (10 mg total) by mouth every evening. 06/23/22   Hongalgi, Maximino Greenland, MD  hydrocortisone (CORTEF) 20 MG tablet Take 1 tablet (20 mg total) by mouth every morning. 03/06/22   Regalado, Belkys A, MD  Multiple Vitamins-Minerals (PRESERVISION AREDS 2) CAPS Take 1 capsule by mouth 2 (two) times daily.    [provider]  pantoprazole (PROTONIX)  40 MG tablet Take 40 mg by mouth daily as needed (heartburn).    [provider]  potassium chloride (KLOR-CON) 10 MEQ tablet Take 10 mEq by mouth daily.    [provider]  rosuvastatin (CRESTOR) 20 MG tablet Take 20 mg by mouth every evening. 10/15/22   [provider]  torsemide (DEMADEX) 10 MG tablet Take 10 mg by mouth daily as  needed (fluid, swelling).    [provider]                                                                                                                                    Past Surgical History Past Surgical History:  Procedure Laterality Date   ANTERIOR CERVICAL DECOMP/DISCECTOMY FUSION     BACK SURGERY     CARPAL TUNNEL RELEASE Bilateral    CORONARY ANGIOPLASTY     DES mid LAD and mid RCA 09/21/04   CRANIOTOMY N/A 05/21/2015   Procedure: Transsphenoidal resection of pituitary tumor with Dr. Narda Bonds for approach;  Surgeon: Hilda Lias, MD;  Location: [facility] NEURO ORS;  Service: Neurosurgery;  Laterality: N/A;  Transsphenoidal resection of pituitary tumor with Dr. Narda Bonds for approach   SHOULDER OPEN ROTATOR CUFF REPAIR Bilateral    SKIN CANCER EXCISION Right    forearm   thumb surgery Right    placed srews to make straight   THYROID SURGERY     "goiter removed"   Family History Family History  Problem Relation Age of Onset   Cancer Father 85       throat- smoker    Breast cancer Sister 54   Cancer Brother 36       lung- smoker    Social History Social History   Tobacco Use   Smoking status: Former    Current packs/day: 0.00    Average packs/day: 0.1 packs/day for 5.0 years (0.6 ttl pk-yrs)    Types: Cigarettes    Start date: 04/26/1964    Quit date: 04/26/1969    Years since quitting: 54.4   Smokeless tobacco: Never  Vaping Use   Vaping status: Never Used  Substance Use Topics   Alcohol use: Not Currently   Drug use: No   Allergies Codeine  Review of Systems A thorough review of systems was obtained and all systems are negative except as noted in the HPI and PMH.   Physical Exam Vital Signs  I have reviewed the triage vital signs BP (!) 98/57   Pulse 78   Temp 97.8 F (36.6 C) (Oral)   Resp 17   Ht 5\' 6"  (1.676 m)   Wt 73.9 kg   LMP  (LMP Unknown)   SpO2 92%   BMI 26.30 kg/m  Physical Exam Vitals and nursing note reviewed.   Constitutional:      General: She is in acute distress.     Appearance: She is ill-appearing.  HENT:  Head: Normocephalic and atraumatic.     Right Ear: External ear normal.     Left Ear: External ear normal.     Nose: Nose normal.     Mouth/Throat:     Mouth: Mucous membranes are dry.  Eyes:     General: No scleral icterus.       Right eye: No discharge.        Left eye: No discharge.     Extraocular Movements: Extraocular movements intact.     Pupils: Pupils are equal, round, and reactive to light.  Cardiovascular:     Rate and Rhythm: Normal rate and regular rhythm.     Pulses: Normal pulses.     Heart sounds: Normal heart sounds.  Pulmonary:     Effort: Respiratory distress present.     Breath sounds: No stridor. Decreased breath sounds present.     Comments: Coarse bilateral Abdominal:     General: Abdomen is flat. There is no distension.     Palpations: Abdomen is soft.     Tenderness: There is no abdominal tenderness.  Musculoskeletal:     Cervical back: No rigidity.     Right lower leg: No edema.     Left lower leg: No edema.  Skin:    General: Skin is warm and dry.     Capillary Refill: Capillary refill takes less than 2 seconds.  Neurological:     Mental Status: She is oriented to person, place, and time. She is lethargic.     GCS: GCS eye subscore is 4. GCS verbal subscore is 5. GCS motor subscore is 6.     Cranial Nerves: Cranial nerves 2-12 are intact.     Sensory: Sensation is intact.     Motor: Motor function is intact.     Coordination: Coordination is intact.     Comments: Gait testing deferred secondary to patient safety. Strength 5/5 to BLUE/BLLE, equal and symmetric   Initially lethargic, mental status improved with improved oxygenation  Psychiatric:        Mood and Affect: Mood normal.        Behavior: Behavior normal. Behavior is cooperative.     ED Results and Treatments Labs (all labs ordered are listed, but only abnormal results are  displayed) Labs Reviewed  RESP PANEL BY RT-PCR (RSV, FLU A&B, COVID)  RVPGX2 - Abnormal; Notable for the following components:      Result Value   SARS Coronavirus 2 by RT PCR POSITIVE (*)    All other components within normal limits  COMPREHENSIVE METABOLIC PANEL - Abnormal; Notable for the following components:   Potassium 3.4 (*)    Glucose, Bld 120 (*)    Creatinine, Ser 1.71 (*)    Total Protein 6.3 (*)    Albumin 3.3 (*)    Total Bilirubin 1.3 (*)    GFR, Estimated 29 (*)    All other components within normal limits  I-STAT VENOUS BLOOD GAS, ED - Abnormal; Notable for the following components:   pCO2, Ven 42.4 (*)    pO2, Ven 25 (*)    Calcium, Ion 1.13 (*)    All other components within normal limits  TROPONIN I (HIGH SENSITIVITY) - Abnormal; Notable for the following components:   Troponin I (High Sensitivity) 18 (*)    All other components within normal limits  CBC WITH DIFFERENTIAL/PLATELET  BRAIN NATRIURETIC PEPTIDE  CK  AMMONIA  I-STAT CG4 LACTIC ACID, ED  I-STAT CG4 LACTIC ACID, ED  Radiology CT Head Wo Contrast Result Date: 09/27/2023 CLINICAL DATA:  Provided history: Delirium, possible trauma. Neck trauma. EXAM: CT HEAD WITHOUT CONTRAST CT CERVICAL SPINE WITHOUT CONTRAST TECHNIQUE: Multidetector CT imaging of the head and cervical spine was performed following the standard protocol without intravenous contrast. Multiplanar CT image reconstructions of the cervical spine were also generated. RADIATION DOSE REDUCTION: This exam was performed according to the departmental dose-optimization program which includes automated exposure control, adjustment of the mA and/or kV according to patient size and/or use of iterative reconstruction technique. COMPARISON:  Head CT 07/30/2023. Brain MRI 06/21/2022. Cervical spine radiographs 09/13/2008. FINDINGS: CT HEAD  FINDINGS Brain: Generalized parenchymal atrophy. Chronic lacunar infarct again noted within the left retrolenticular white matter. Patchy and ill-defined hypoattenuation elsewhere within the cerebral white matter, nonspecific but compatible with moderate chronic small vessel ischemic disease. There is no acute intracranial hemorrhage. No demarcated cortical infarct. No extra-axial fluid collection. No evidence of an intracranial mass. No midline shift. Vascular: No hyperdense vessel. Atherosclerotic calcifications. Skull: No calvarial fracture or aggressive osseous lesion. Sinuses/Orbits: No mass or acute finding within the imaged orbits. Mild mucosal thickening within the bilateral maxillary sinuses. Complete opacification of the right sphenoid sinus (with associated chronic reactive osteitis). CT CERVICAL SPINE FINDINGS Alignment: 2 mm C7-T1 grade 1 anterolisthesis. Skull base and vertebrae: The basion-dental and atlanto-dental intervals are maintained.No evidence of acute fracture to the cervical spine. Prior C3-C6 ACDF. No evidence of acute hardware compromise. Soft tissues and spinal canal: No prevertebral fluid or swelling. No visible canal hematoma. Disc levels: Prior C3-C6 ACDF. Disc degeneration at the non-operative levels, greatest at C6-C7 (advanced at this level). Small C2-C3 central disc protrusion. Posterior disc osteophyte complex at C6-C7. Multilevel uncovertebral hypertrophy. No appreciable high-grade spinal canal stenosis. Multilevel bony neural foraminal narrowing. Degenerative changes also present at the C1-C2 articulation. Upper chest: Minimal atelectasis within the dependent aspect of the left upper lobe at the imaged levels. No visible pneumothorax. IMPRESSION: CT head: 1.  No evidence of an acute intracranial abnormality. 2. Parenchymal atrophy and chronic small vessel ischemic disease, as described. 3. Paranasal sinus disease (including severe chronic right sphenoid sinusitis). CT cervical  spine: 1. No evidence of an acute cervical spine fracture. 2. 2 mm C7-T1 grade 1 anterolisthesis. 3. Cervical spondylosis and post-operative changes, as described. Electronically Signed   By: Jackey Loge D.O.   On: 09/27/2023 14:41   CT Cervical Spine Wo Contrast Result Date: 09/27/2023 CLINICAL DATA:  Provided history: Delirium, possible trauma. Neck trauma. EXAM: CT HEAD WITHOUT CONTRAST CT CERVICAL SPINE WITHOUT CONTRAST TECHNIQUE: Multidetector CT imaging of the head and cervical spine was performed following the standard protocol without intravenous contrast. Multiplanar CT image reconstructions of the cervical spine were also generated. RADIATION DOSE REDUCTION: This exam was performed according to the departmental dose-optimization program which includes automated exposure control, adjustment of the mA and/or kV according to patient size and/or use of iterative reconstruction technique. COMPARISON:  Head CT 07/30/2023. Brain MRI 06/21/2022. Cervical spine radiographs 09/13/2008. FINDINGS: CT HEAD FINDINGS Brain: Generalized parenchymal atrophy. Chronic lacunar infarct again noted within the left retrolenticular white matter. Patchy and ill-defined hypoattenuation elsewhere within the cerebral white matter, nonspecific but compatible with moderate chronic small vessel ischemic disease. There is no acute intracranial hemorrhage. No demarcated cortical infarct. No extra-axial fluid collection. No evidence of an intracranial mass. No midline shift. Vascular: No hyperdense vessel. Atherosclerotic calcifications. Skull: No calvarial fracture or aggressive osseous lesion. Sinuses/Orbits: No mass or acute finding within the  imaged orbits. Mild mucosal thickening within the bilateral maxillary sinuses. Complete opacification of the right sphenoid sinus (with associated chronic reactive osteitis). CT CERVICAL SPINE FINDINGS Alignment: 2 mm C7-T1 grade 1 anterolisthesis. Skull base and vertebrae: The basion-dental  and atlanto-dental intervals are maintained.No evidence of acute fracture to the cervical spine. Prior C3-C6 ACDF. No evidence of acute hardware compromise. Soft tissues and spinal canal: No prevertebral fluid or swelling. No visible canal hematoma. Disc levels: Prior C3-C6 ACDF. Disc degeneration at the non-operative levels, greatest at C6-C7 (advanced at this level). Small C2-C3 central disc protrusion. Posterior disc osteophyte complex at C6-C7. Multilevel uncovertebral hypertrophy. No appreciable high-grade spinal canal stenosis. Multilevel bony neural foraminal narrowing. Degenerative changes also present at the C1-C2 articulation. Upper chest: Minimal atelectasis within the dependent aspect of the left upper lobe at the imaged levels. No visible pneumothorax. IMPRESSION: CT head: 1.  No evidence of an acute intracranial abnormality. 2. Parenchymal atrophy and chronic small vessel ischemic disease, as described. 3. Paranasal sinus disease (including severe chronic right sphenoid sinusitis). CT cervical spine: 1. No evidence of an acute cervical spine fracture. 2. 2 mm C7-T1 grade 1 anterolisthesis. 3. Cervical spondylosis and post-operative changes, as described. Electronically Signed   By: Jackey Loge D.O.   On: 09/27/2023 14:41   DG Shoulder Left Result Date: 09/27/2023 CLINICAL DATA:  Pain. EXAM: LEFT SHOULDER - 2+ VIEW COMPARISON:  None Available. FINDINGS: There is no evidence of acute fracture or dislocation. Mild degenerative changes of the glenohumeral and acromioclavicular joints. Soft tissues are unremarkable. IMPRESSION: 1. No acute osseous abnormality. 2. Mild degenerative changes of the glenohumeral and acromioclavicular joints. Electronically Signed   By: Hart Robinsons M.D.   On: 09/27/2023 13:56   DG Chest Port 1 View Result Date: 09/27/2023 CLINICAL DATA:  Altered mental status and hypoxia. EXAM: PORTABLE CHEST 1 VIEW COMPARISON:  Chest radiograph dated 09/12/2023. FINDINGS: The heart  size and mediastinal contours are unchanged. Aortic atherosclerosis. Improved aeration at the left lung base. Streaky opacities at the medial right lung base. No sizable pleural effusion. No pneumothorax. Anterior lower cervical fusion hardware in place. Suture anchor in the right humeral head. No acute osseous abnormality. IMPRESSION: 1. Streaky opacities at the right lung base could reflect atelectasis or infiltrate. 2. Improved aeration at the left lung base. Electronically Signed   By: Hart Robinsons M.D.   On: 09/27/2023 13:54    Pertinent labs & imaging results that were available during my care of the patient were reviewed by me and considered in my medical decision making (see MDM for details).  Medications Ordered in ED Medications  azithromycin (ZITHROMAX) 500 mg in sodium chloride 0.9 % 250 mL IVPB (has no administration in time range)  sodium chloride 0.9 % bolus 1,000 mL (has no administration in time range)  acetaminophen (TYLENOL) tablet 650 mg (has no administration in time range)    Or  acetaminophen (TYLENOL) suppository 650 mg (has no administration in time range)  polyethylene glycol (MIRALAX / GLYCOLAX) packet 17 g (has no administration in time range)  bisacodyl (DULCOLAX) EC tablet 5 mg (has no administration in time range)  albuterol (PROVENTIL) (2.5 MG/3ML) 0.083% nebulizer solution 2.5 mg (has no administration in time range)  hydrALAZINE (APRESOLINE) injection 10 mg (has no administration in time range)  enoxaparin (LOVENOX) injection 30 mg (has no administration in time range)  oxyCODONE (Oxy IR/ROXICODONE) immediate release tablet 5 mg (has no administration in time range)  sodium chloride 0.9 % bolus  500 mL (0 mLs Intravenous Stopped 09/27/23 1212)  sodium chloride 0.9 % bolus 500 mL (500 mLs Intravenous New Bag/Given 09/27/23 1423)  cefTRIAXone (ROCEPHIN) 1 g in sodium chloride 0.9 % 100 mL IVPB (1 g Intravenous New Bag/Given 09/27/23 1438)                                                                                                                                      Procedures .Critical Care  Performed by: Sloan Leiter, DO Authorized by: Sloan Leiter, DO   Critical care provider statement:    Critical care time (minutes):  50   Critical care time was exclusive of:  Separately billable procedures and treating other patients   Critical care was necessary to treat or prevent imminent or life-threatening deterioration of the following conditions:  Respiratory failure   Critical care was time spent personally by me on the following activities:  Development of treatment plan with patient or surrogate, discussions with consultants, evaluation of patient's response to treatment, examination of patient, ordering and review of laboratory studies, ordering and review of radiographic studies, ordering and performing treatments and interventions, pulse oximetry, re-evaluation of patient's condition, review of old charts and obtaining history from patient or surrogate   Care discussed with: admitting provider     (including critical care time)  Medical Decision Making / ED Course    Medical Decision Making:    MAKYIA ERXLEBEN is a 89 y.o. female with past medical history as below, significant for recent COVID-19, CAD, chronic back pain, HLD, HTN, malnutrition who presents to the ED with complaint of hypoxia, AMS. The complaint involves an extensive differential diagnosis and also carries with it a high risk of complications and morbidity.  Serious etiology was considered. Ddx includes but is not limited to: In my evaluation of this patient's dyspnea my DDx includes, but is not limited to, pneumonia, pulmonary embolism, pneumothorax, pulmonary edema, metabolic acidosis, asthma, COPD, cardiac cause, anemia, anxiety, etc.  Differential diagnoses for altered mental status includes but is not exclusive to alcohol, illicit or prescription medications, intracranial  pathology such as stroke, intracerebral hemorrhage, fever or infectious causes including sepsis, hypoxemia, uremia, trauma, endocrine related disorders such as diabetes, hypoglycemia, thyroid-related diseases, etc.    Complete initial physical exam performed, notably the patient was in acute respiratory distress, hypoxic on arrival, improved with nonrebreather..    Reviewed and confirmed nursing documentation for past medical history, family history, social history.  Vital signs reviewed.    Clinical Course as of 09/27/23 1547  Tue Sep 27, 2023  1056 Wilhemina Bonito Son Emergency Contact [phone] >no answer, left VM  Jari Favre Niece [phone] >VM not set up      [SG]  [address] called back. Spoke with her yestd in the afternoon around 2pm or so. Her oxygen was low at that time, exertional dib. Since getting home, not eating  drinking. Not getting out of bed/recliner. Passed out this morning. [SG]  1213 Creatinine(!): 1.71 Approx baseline  [SG]  1400 SARS Coronavirus 2 by RT PCR(!): POSITIVE This was also positive 3 wks ago [SG]  1504 Troponin I (High Sensitivity)(!): 18 Favor demand ischemia [SG]    Clinical Course User Index [SG] Sloan Leiter, DO    Brief summary: 89 year old female history as above here with AMS, hypoxia.  Does not use home oxygen.  Hypoxia is improved. Started nonrebreather, will transition to high flow nasal cannula.  Do not feel patient would tolerate BiPAP.  She is currently full code. She is on 10 L heated high flow, tolerating well.  X-ray concerning for pneumonia.  Postviral pneumonia seems likely given recent COVID-19 infection.  Labs otherwise are stable. Not septic Will admit for respiratory failure, pneumonia.                  Additional history obtained: -Additional history obtained from friend -External records from outside source obtained and reviewed including: Chart review including previous notes, labs,  imaging, consultation notes including  Recent admission, prior labs and imaging, home medications   Lab Tests: -I ordered, reviewed, and interpreted labs.   The pertinent results include:   Labs Reviewed  RESP PANEL BY RT-PCR (RSV, FLU A&B, COVID)  RVPGX2 - Abnormal; Notable for the following components:      Result Value   SARS Coronavirus 2 by RT PCR POSITIVE (*)    All other components within normal limits  COMPREHENSIVE METABOLIC PANEL - Abnormal; Notable for the following components:   Potassium 3.4 (*)    Glucose, Bld 120 (*)    Creatinine, Ser 1.71 (*)    Total Protein 6.3 (*)    Albumin 3.3 (*)    Total Bilirubin 1.3 (*)    GFR, Estimated 29 (*)    All other components within normal limits  I-STAT VENOUS BLOOD GAS, ED - Abnormal; Notable for the following components:   pCO2, Ven 42.4 (*)    pO2, Ven 25 (*)    Calcium, Ion 1.13 (*)    All other components within normal limits  TROPONIN I (HIGH SENSITIVITY) - Abnormal; Notable for the following components:   Troponin I (High Sensitivity) 18 (*)    All other components within normal limits  CBC WITH DIFFERENTIAL/PLATELET  BRAIN NATRIURETIC PEPTIDE  CK  AMMONIA  I-STAT CG4 LACTIC ACID, ED  I-STAT CG4 LACTIC ACID, ED    Notable for stable labs  EKG   EKG Interpretation Date/Time:  Tuesday September 27 2023 10:36:01 EST Ventricular Rate:  93 PR Interval:  177 QRS Duration:  95 QT Interval:  404 QTC Calculation: 503 R Axis:   -65  Text Interpretation: Sinus rhythm Inferior infarct, old Prolonged QT interval no stemi similar to prior Confirmed by Tanda Rockers ([id number]) on 09/27/2023 11:05:06 AM         Imaging Studies ordered: I ordered imaging studies including CXR, CTH CTC-S, CT chest  I independently visualized the following imaging with scope of interpretation limited to determining acute life threatening conditions related to emergency care; findings noted above I independently visualized and interpreted  imaging. I agree with the radiologist interpretation   Medicines ordered and prescription drug management: Meds ordered this encounter  Medications   sodium chloride 0.9 % bolus 500 mL   sodium chloride 0.9 % bolus 500 mL   cefTRIAXone (ROCEPHIN) 1 g in sodium chloride 0.9 % 100 mL IVPB  Antibiotic Indication::   CAP   azithromycin (ZITHROMAX) 500 mg in sodium chloride 0.9 % 250 mL IVPB    Antibiotic Indication::   CAP   sodium chloride 0.9 % bolus 1,000 mL   OR Linked Order Group    acetaminophen (TYLENOL) tablet 650 mg    acetaminophen (TYLENOL) suppository 650 mg   polyethylene glycol (MIRALAX / GLYCOLAX) packet 17 g   bisacodyl (DULCOLAX) EC tablet 5 mg   albuterol (PROVENTIL) (2.5 MG/3ML) 0.083% nebulizer solution 2.5 mg   hydrALAZINE (APRESOLINE) injection 10 mg   enoxaparin (LOVENOX) injection 30 mg   oxyCODONE (Oxy IR/ROXICODONE) immediate release tablet 5 mg    Refill:  0    -I have reviewed the patients home medicines and have made adjustments as needed   Consultations Obtained: I requested consultation with the hospitalist   Cardiac Monitoring: The patient was maintained on a cardiac monitor.  I personally viewed and interpreted the cardiac monitored which showed an underlying rhythm of: NSR Continuous pulse oximetry interpreted by myself, 97% on 10L HFNC.    Social Determinants of Health:  Diagnosis or treatment significantly limited by social determinants of health: former smoker   Reevaluation: After the interventions noted above, I reevaluated the patient and found that they have improved  Co morbidities that complicate the patient evaluation  Past Medical History:  Diagnosis Date   Acute respiratory failure with hypoxia (HCC) 11/15/2017   AKI (acute kidney injury) (HCC) 11/15/2017   Arthritis    "all over" (11/14/2017)   Brain tumor (HCC)    "I still have it"; not cancer (11/14/2017)   Chest pain, atypical 09/11/2021   Chronic lower back pain     Community acquired pneumonia 02/14/2023   Coronary artery disease    Dr. Algie Coffer   Elevated CK 11/15/2017   GERD (gastroesophageal reflux disease)    History of blood transfusion    "don't remember why" (11/14/2017)   History of radiation therapy 07/03/2018   brain, pituitary/ 12.5 Gy in 1 fraction   Hypercholesteremia    Hypertension    Influenza A 11/14/2017   MI (myocardial infarction) (HCC) 2006   PONV (postoperative nausea and vomiting)    Skin cancer    "right forearm"      Dispostion: Disposition decision including need for hospitalization was considered, and patient admitted to the hospital.    Final Clinical Impression(s) / ED Diagnoses Final diagnoses:  Respiratory distress  Syncope, unspecified syncope type  Community acquired pneumonia, unspecified laterality        Sloan Leiter, DO 09/27/23 1546    Sloan Leiter, DO 09/27/23 1547

## 2023-09-27 NOTE — ED Notes (Signed)
Dr Juanita Craver notified of critical labs

## 2023-09-27 NOTE — Plan of Care (Signed)

## 2023-09-28 DIAGNOSIS — N184 Chronic kidney disease, stage 4 (severe): Secondary | ICD-10-CM | POA: Diagnosis present

## 2023-09-28 DIAGNOSIS — Z981 Arthrodesis status: Secondary | ICD-10-CM | POA: Diagnosis not present

## 2023-09-28 DIAGNOSIS — E7800 Pure hypercholesterolemia, unspecified: Secondary | ICD-10-CM | POA: Diagnosis present

## 2023-09-28 DIAGNOSIS — G9341 Metabolic encephalopathy: Secondary | ICD-10-CM | POA: Diagnosis present

## 2023-09-28 DIAGNOSIS — Z85828 Personal history of other malignant neoplasm of skin: Secondary | ICD-10-CM | POA: Diagnosis not present

## 2023-09-28 DIAGNOSIS — N179 Acute kidney failure, unspecified: Secondary | ICD-10-CM | POA: Diagnosis present

## 2023-09-28 DIAGNOSIS — Z801 Family history of malignant neoplasm of trachea, bronchus and lung: Secondary | ICD-10-CM | POA: Diagnosis not present

## 2023-09-28 DIAGNOSIS — J9601 Acute respiratory failure with hypoxia: Secondary | ICD-10-CM | POA: Diagnosis present

## 2023-09-28 DIAGNOSIS — I129 Hypertensive chronic kidney disease with stage 1 through stage 4 chronic kidney disease, or unspecified chronic kidney disease: Secondary | ICD-10-CM | POA: Diagnosis present

## 2023-09-28 DIAGNOSIS — J189 Pneumonia, unspecified organism: Secondary | ICD-10-CM | POA: Diagnosis present

## 2023-09-28 DIAGNOSIS — G8929 Other chronic pain: Secondary | ICD-10-CM | POA: Diagnosis present

## 2023-09-28 DIAGNOSIS — M170 Bilateral primary osteoarthritis of knee: Secondary | ICD-10-CM | POA: Diagnosis present

## 2023-09-28 DIAGNOSIS — Z7409 Other reduced mobility: Secondary | ICD-10-CM | POA: Diagnosis present

## 2023-09-28 DIAGNOSIS — K219 Gastro-esophageal reflux disease without esophagitis: Secondary | ICD-10-CM | POA: Diagnosis present

## 2023-09-28 DIAGNOSIS — R531 Weakness: Secondary | ICD-10-CM | POA: Diagnosis present

## 2023-09-28 DIAGNOSIS — Z8616 Personal history of COVID-19: Secondary | ICD-10-CM | POA: Diagnosis not present

## 2023-09-28 DIAGNOSIS — I2489 Other forms of acute ischemic heart disease: Secondary | ICD-10-CM | POA: Diagnosis present

## 2023-09-28 DIAGNOSIS — M545 Low back pain, unspecified: Secondary | ICD-10-CM | POA: Diagnosis present

## 2023-09-28 DIAGNOSIS — E876 Hypokalemia: Secondary | ICD-10-CM | POA: Diagnosis present

## 2023-09-28 DIAGNOSIS — E2740 Unspecified adrenocortical insufficiency: Secondary | ICD-10-CM | POA: Diagnosis present

## 2023-09-28 DIAGNOSIS — Z803 Family history of malignant neoplasm of breast: Secondary | ICD-10-CM | POA: Diagnosis not present

## 2023-09-28 DIAGNOSIS — Z79899 Other long term (current) drug therapy: Secondary | ICD-10-CM | POA: Diagnosis not present

## 2023-09-28 DIAGNOSIS — I2510 Atherosclerotic heart disease of native coronary artery without angina pectoris: Secondary | ICD-10-CM | POA: Diagnosis present

## 2023-09-28 DIAGNOSIS — I959 Hypotension, unspecified: Secondary | ICD-10-CM | POA: Diagnosis present

## 2023-09-28 DIAGNOSIS — J323 Chronic sphenoidal sinusitis: Secondary | ICD-10-CM | POA: Diagnosis present

## 2023-09-28 LAB — CBC
HCT: 32.6 % — ABNORMAL LOW (ref 36.0–46.0)
Hemoglobin: 11 g/dL — ABNORMAL LOW (ref 12.0–15.0)
MCH: 31.2 pg (ref 26.0–34.0)
MCHC: 33.7 g/dL (ref 30.0–36.0)
MCV: 92.4 fL (ref 80.0–100.0)
Platelets: 220 10*3/uL (ref 150–400)
RBC: 3.53 MIL/uL — ABNORMAL LOW (ref 3.87–5.11)
RDW: 12.7 % (ref 11.5–15.5)
WBC: 4 10*3/uL (ref 4.0–10.5)
nRBC: 0 % (ref 0.0–0.2)

## 2023-09-28 LAB — BASIC METABOLIC PANEL
Anion gap: 8 (ref 5–15)
BUN: 10 mg/dL (ref 8–23)
CO2: 21 mmol/L — ABNORMAL LOW (ref 22–32)
Calcium: 8.4 mg/dL — ABNORMAL LOW (ref 8.9–10.3)
Chloride: 106 mmol/L (ref 98–111)
Creatinine, Ser: 1.81 mg/dL — ABNORMAL HIGH (ref 0.44–1.00)
GFR, Estimated: 27 mL/min — ABNORMAL LOW (ref >60)
Glucose, Bld: 108 mg/dL — ABNORMAL HIGH (ref 70–99)
Potassium: 3.7 mmol/L (ref 3.5–5.1)
Sodium: 135 mmol/L (ref 135–145)

## 2023-09-28 LAB — GLUCOSE, CAPILLARY
Glucose-Capillary: 96 mg/dL (ref 70–99)
Glucose-Capillary: 139 mg/dL — ABNORMAL HIGH (ref 70–99)
Glucose-Capillary: 148 mg/dL — ABNORMAL HIGH (ref 70–99)
Glucose-Capillary: 203 mg/dL — ABNORMAL HIGH (ref 70–99)

## 2023-09-28 LAB — CORTISOL-AM, BLOOD: Cortisol - AM: 6.6 ug/dL — ABNORMAL LOW (ref 6.7–22.6)

## 2023-09-28 MED ORDER — DEXTROSE-SODIUM CHLORIDE 5-0.9 % IV SOLN
INTRAVENOUS | Status: AC
  Filled 2023-09-28: qty 1000

## 2023-09-28 MED ORDER — SODIUM CHLORIDE 0.9 % IV BOLUS
500.0000 mL | Freq: Once | INTRAVENOUS | Status: AC
Start: 2023-09-28 — End: 2023-09-28
  Administered 2023-09-28: 500 mL via INTRAVENOUS

## 2023-09-28 NOTE — Progress Notes (Signed)
PROGRESS NOTE  Adriana Jordan  DOB: June 20, 1935  PCP: Orpah Cobb, MD ZOX:[id number]  DOA: 09/27/2023  LOS: 0 days  Hospital Day: 2  Brief narrative: Adriana Jordan is a 89 y.o. female with PMH significant for HTN, HLD, CAD, CVA, syncope, GERD, pituitary adenoma s/p surgical intervention and radiation, adrenal insufficiency on hydrocortisone. This morning, patient was brought to the ED from home by EMS for lethargy, altered mental status, hypoxia to 70s.  Most recently hospitalized 2/3 to 2/5 for near syncopal episode, lethargy.Marland Kitchen  She was noted to be COVID PCR positive.  COVID improved with supportive care.  Because of hypotension and AKI- losartan, Aldactone and torsemide were held.  Patient was discharged home to follow-up with PCP as an outpatient.  Patient lives at home with her son.  Patient reports that at baseline, she is able to ambulate, gets limited because of osteoarthritis of both knees.  Since last hospitalization, she has been less mobile and progressively weak.  Reports compliance to her medications.  However on review of her meds later, it seems patient was not aware of the diagnosis of adrenal insufficiency and is not taking hydrocortisone that she was supposed to twice daily.  2/18, patient felt weak and could not get out of her bed.  Her son called EMS noted her to be having difficulty breathing, reduced responsiveness and called EMS. On EMS arrival, her O2 sat was low in 70s.  With nonrebreather mask, oxygen saturation and mental status gradually improved.  In the ED, patient was afebrile, heart rate in 80s, blood pressure in 90s and low 100s, initially breathing on nonrebreather mask.  Currently on 4 L nasal cannula Initial labs with CBC unremarkable, BMP with potassium low at 3.4, BUN/creatinine elevated to 8/1.71, ammonia normal at 17. VBG with pH 7.4, pCO2 42, bicarb 26 Respiratory virus panel positive for COVID PCR CT chest showed mild lower lobe dependent  partial consolidations, possible atelectasis, pneumonia or aspiration.   CT head  1. No evidence of an acute intracranial abnormality. 2. Parenchymal atrophy and chronic small vessel ischemic disease 3. Paranasal sinus disease (including severe chronic right sphenoid sinusitis).   CT cervical spine 1. No evidence of an acute cervical spine fracture. 2. 2 mm C7-T1 grade 1 anterolisthesis.  Admitted to [facility]  Subjective: Patient was seen and examined this morning.  Pleasant elderly Caucasian female.  Sitting up in recliner. On supplemental oxygen. No family at bedside. AM cortisol level this morning low at 1.6. I asked her if she took hydrocortisone.  She was not aware that she has diagnosis of adrenal insufficiency and was supposed to be on hydrocortisone.  Assessment/Plan: Acute respiratory failure with hypoxia Bibasilar pneumonia Per EMS O2 sat was low at 70s with them. COVID-positive but she has had positive test 3 weeks ago as well CT chest with bibasilar haziness. No fever.  WBC count and lactic acid normal.  Procalcitonin level elevated. Given the severity of her symptoms, she was started on a course of IV Rocephin and doxycycline. On 3 L oxygen by nasal this morning.  Continue to monitor.  Wean down as tolerated.  Check ambulatory oxygen requirement Recent Labs  Lab 09/27/23 1042 09/27/23 1107 09/27/23 1337 09/28/23 0639  WBC 6.4  --   --  4.0  LATICACIDVEN  --   --  1.5  --   PROCALCITON  --  0.26  --   --    Acute metabolic encephalopathy Likely secondary to hypoxia CT head without any  acute abnormality.  Showed parenchymal atrophy and chronic small vessel disease Mental status gradually improving.  Hypotension Blood pressure in low 100s and 90s.  Torsemide remains on hold.  Patient is on IV fluid.  Adrenal insufficiency Supposed to be on hydrocortisone 20 mg a.m. and 10 mg p.m. it seems patient was not taking it at home after last discharge.  Started  hydrocortisone last night.  Serum cortisol level this morning still low at 1.6.  Hopefully it will rise after next few doses.    CAD CVA/HLD PTA meds- aspirin 81 mg daily, Crestor 20 mg daily,  Severe chronic right sphenoid sinusitis No signs tenderness. Monitor  Impaired mobility Progressive generalized weakness Patient lives at home with her son.  Patient reports that at baseline, she is able to ambulate, gets limited because of osteoarthritis of both knees.  Since last hospitalization, she has been less mobile and progressively weak. PT eval requested  Goals of care:   Code Status: Full Code    DVT prophylaxis:  enoxaparin (LOVENOX) injection 30 mg Start: 09/27/23 1600   Antimicrobials: IV Rocephin, oral doxycycline Fluid: None Consultants: None Family Communication: None at bedside  Dispo: The patient is from: Home              Anticipated d/c is to: Pending clinical course.  Hopefully home with HHPT in 1 to 2 days    Diet:  Diet Order             Diet Heart Room service appropriate? Yes; Fluid consistency: Thin  Diet effective now                   Scheduled Meds:  aspirin EC  81 mg Oral Daily   doxycycline  100 mg Oral Q12H   enoxaparin (LOVENOX) injection  30 mg Subcutaneous Q24H   hydrocortisone  10 mg Oral QPM   hydrocortisone  20 mg Oral BH-q7a   rosuvastatin  20 mg Oral QPM    PRN meds: acetaminophen **OR** acetaminophen, albuterol, bisacodyl, hydrALAZINE, oxyCODONE, pantoprazole, polyethylene glycol   Infusions:   cefTRIAXone (ROCEPHIN)  IV 1 g (09/28/23 1304)   dextrose 5 % and 0.9 % NaCl 75 mL/hr at 09/28/23 1035    Antimicrobials: Anti-infectives (From admission, onward)    Start     Dose/Rate Route Frequency Ordered Stop   09/28/23 1400  cefTRIAXone (ROCEPHIN) 1 g in sodium chloride 0.9 % 100 mL IVPB        1 g 200 mL/hr over 30 Minutes Intravenous Every 24 hours 09/27/23 1729     09/27/23 2200  doxycycline (VIBRA-TABS) tablet 100  mg        100 mg Oral Every 12 hours 09/27/23 1729     09/27/23 1430  cefTRIAXone (ROCEPHIN) 1 g in sodium chloride 0.9 % 100 mL IVPB        1 g 200 mL/hr over 30 Minutes Intravenous  Once 09/27/23 1422 09/27/23 1638   09/27/23 1430  azithromycin (ZITHROMAX) 500 mg in sodium chloride 0.9 % 250 mL IVPB        500 mg 250 mL/hr over 60 Minutes Intravenous  Once 09/27/23 1422 09/27/23 2031       Objective: Vitals:   09/28/23 1250 09/28/23 1254  BP: (!) 89/56 (!) 101/54  Pulse: 77 75  Resp: 16   Temp: (!) 97.4 F (36.3 C)   SpO2: 100% 97%    Intake/Output Summary (Last 24 hours) at 09/28/2023 1355 Last data filed at 09/28/2023  1259 Gross per 24 hour  Intake 418 ml  Output --  Net 418 ml   Filed Weights   09/27/23 1033 09/27/23 1726  Weight: 73.9 kg 73 kg   Weight change:  Body mass index is 25.98 kg/m.   Physical Exam: General exam: Pleasant, elderly Caucasian female.  Feels weak Skin: No rashes, lesions or ulcers. HEENT: Atraumatic, normocephalic, no obvious bleeding Lungs: Clear to auscultation bilaterally,  CVS: S1, S2, no murmur,   GI/Abd: Soft, nontender, nondistended, bowel sound present,   CNS: Slow to respond but alert, awake, oriented x 3 Psychiatry: Sad affect Extremities: No pedal edema, no calf tenderness,   Data Review: I have personally reviewed the laboratory data and studies available.  F/u labs  Unresulted Labs (From admission, onward)     Start     Ordered   09/29/23 0500  Basic metabolic panel  Daily,   R      09/28/23 0741   09/29/23 0500  CBC with Differential/Platelet  Daily,   R      09/28/23 0741             Total time spent in review of labs and imaging, patient evaluation, formulation of plan, documentation and communication with family: 45 minutes  Signed, Lorin Glass, MD [facility] 09/28/2023

## 2023-09-28 NOTE — Evaluation (Addendum)
Physical Therapy Evaluation Patient Details Name: Adriana Jordan MRN: [id number] DOB: [birth year] Today's Date: 09/28/2023  History of Present Illness  Pt is 89 year old presented [facility]  09/27/23 for acute respiratory failure due to PNA. PMH - recent Covid, pituitary adenoma s/p surgical resection and radiation, adrenal insufficiency, CVA, arthritis, syncope, HTN, CAD with remote MI, CKD, cervical spine DDD s/p ACDF.  Clinical Impression  Pt admitted with above diagnosis and presents to PT with functional limitations due to deficits listed below (See PT problem list). Pt needs skilled PT to maximize independence and safety. Pt lives at home with supportive son. Able to amb in hallway with assistance. Chronic rt knee pain limits ambulation distance. Expect pt can return home with son. Recommend HHPT if pt accepting, appears she refused after last hospitalization and again will refuse.          If plan is discharge home, recommend the following: A little help with walking and/or transfers;A little help with bathing/dressing/bathroom;Assistance with cooking/housework;Assist for transportation;Help with stairs or ramp for entrance   Can travel by private vehicle        Equipment Recommendations None recommended by PT  Recommendations for Other Services       Functional Status Assessment Patient has had a recent decline in their functional status and demonstrates the ability to make significant improvements in function in a reasonable and predictable amount of time.     Precautions / Restrictions Precautions Precautions: Fall;Other (comment) Recall of Precautions/Restrictions: Impaired Precaution/Restrictions Comments: check SpO2      Mobility  Bed Mobility Overal bed mobility: Needs Assistance Bed Mobility: Supine to Sit     Supine to sit: Contact guard, HOB elevated     General bed mobility comments: Incr time and effort    Transfers Overall transfer level: Needs  assistance Equipment used: Rolling walker (2 wheels) Transfers: Sit to/from Stand Sit to Stand: Min assist           General transfer comment: Assist to bring hips up. Verbal cues for hand placement    Ambulation/Gait Ambulation/Gait assistance: Contact guard assist Gait Distance (Feet): 90 Feet Assistive device: Rolling walker (2 wheels) Gait Pattern/deviations: Step-through pattern, Decreased step length - right, Decreased step length - left, Shuffle, Antalgic, Decreased stance time - right Gait velocity: decr Gait velocity interpretation: <1.31 ft/sec, indicative of household ambulator   General Gait Details: Assist for safety. Rt knee pain and fatigue limit distance  Stairs            Wheelchair Mobility     Tilt Bed    Modified Rankin (Stroke Patients Only)       Balance Overall balance assessment: Needs assistance Sitting-balance support: No upper extremity supported, Feet supported Sitting balance-Leahy Scale: Good     Standing balance support: Bilateral upper extremity supported, During functional activity Standing balance-Leahy Scale: Poor Standing balance comment: walker and supervision for static standing                             Pertinent Vitals/Pain Pain Assessment Pain Assessment: Faces Faces Pain Scale: Hurts little more Pain Location: R knee Pain Descriptors / Indicators: Grimacing, Guarding Pain Intervention(s): Limited activity within patient's tolerance, Monitored during session    Home Living Family/patient expects to be discharged to:: Private residence Living Arrangements: Children Available Help at Discharge: Family;Available 24 hours/day Type of Home: House Home Access: Stairs to enter Entrance Stairs-Rails: Right Entrance Stairs-Number of  Steps: 3   Home Layout: One level Home Equipment: Rolling Walker (2 wheels);Cane - single point;BSC/3in1      Prior Function Prior Level of Function : Patient poor  historian/Family not available             Mobility Comments: Pt reports not using any assistive device at home even since recent hospitalization. Unsure if accurate. Reports chronic rt knee pain limits amb.       Extremity/Trunk Assessment   Upper Extremity Assessment Upper Extremity Assessment: Defer to OT evaluation    Lower Extremity Assessment Lower Extremity Assessment: Generalized weakness    Cervical / Trunk Assessment Cervical / Trunk Assessment: Kyphotic  Communication   Communication Communication: Impaired Factors Affecting Communication: Hearing impaired    Cognition Arousal: Alert Behavior During Therapy: WFL for tasks assessed/performed   PT - Cognitive impairments: No family/caregiver present to determine baseline                       PT - Cognition Comments: Pt following commands and oriented. Unsure of accuracy of activity level at home Following commands: Intact       Cueing Cueing Techniques: Verbal cues     General Comments General comments (skin integrity, edema, etc.): Pt on 5L O2 when entered with SpO2 98%. Weaned to RA over 7 minutes with SpO2 92%. Pt amb on RA with SpO2 dropping to 86%. Replaced O2 at 3L after amb to return SpO2 to 92%.    Exercises     Assessment/Plan    PT Assessment Patient needs continued PT services  PT Problem List Decreased strength;Decreased activity tolerance;Decreased mobility;Decreased balance;Pain       PT Treatment Interventions DME instruction;Gait training;Stair training;Functional mobility training;Therapeutic activities;Therapeutic exercise;Balance training;Patient/family education    PT Goals (Current goals can be found in the Care Plan section)  Acute Rehab PT Goals Patient Stated Goal: return home PT Goal Formulation: With patient Time For Goal Achievement: 10/12/23 Potential to Achieve Goals: Good    Frequency Min 1X/week     Co-evaluation               AM-PAC PT "6  Clicks" Mobility  Outcome Measure Help needed turning from your back to your side while in a flat bed without using bedrails?: A Little Help needed moving from lying on your back to sitting on the side of a flat bed without using bedrails?: A Little Help needed moving to and from a bed to a chair (including a wheelchair)?: A Little Help needed standing up from a chair using your arms (e.g., wheelchair or bedside chair)?: A Little Help needed to walk in hospital room?: A Little Help needed climbing 3-5 steps with a railing? : A Lot 6 Click Score: 17    End of Session Equipment Utilized During Treatment: Gait belt;Oxygen Activity Tolerance: Patient tolerated treatment well;Patient limited by pain Patient left: in chair;with call bell/phone within reach;with chair alarm set Nurse Communication: Mobility status;Other (comment) (decr O2 needs, purewick removed) PT Visit Diagnosis: Unsteadiness on feet (R26.81);Other abnormalities of gait and mobility (R26.89);Muscle weakness (generalized) (M62.81);Pain Pain - Right/Left: Right Pain - part of body: Knee    Time: 5009-3818 PT Time Calculation (min) (ACUTE ONLY): 32 min   Charges:   PT Evaluation $PT Eval Moderate Complexity: 1 Mod PT Treatments $Gait Training: 8-22 mins PT General Charges $$ ACUTE PT VISIT: 1 Visit         [facility] PT Acute Rehabilitation Services Office [phone]  [name] [facility] 09/28/2023, 1:05 PM

## 2023-09-28 NOTE — Plan of Care (Signed)

## 2023-09-28 NOTE — Care Management Obs Status (Cosign Needed)
MEDICARE OBSERVATION STATUS NOTIFICATION   Patient Details  Name: Adriana Jordan MRN: [id number] Date of Birth: 11/16/1934   Medicare Observation Status Notification Given:  Yes    Janae Bridgeman, RN 09/28/2023, 10:42 AM

## 2023-09-28 NOTE — Progress Notes (Signed)
Transition of Care [facility]) - Inpatient Brief Assessment   Patient Details  Name: Adriana Jordan MRN: [id number] Date of Birth: February 21, 1935  [facility] of [facility]) CM/SW Contact:    Janae Bridgeman, RN Phone Number: 09/28/2023, 10:48 AM   Clinical Narrative:  CM met with the patient at the bedside.  Patient lives at home with son and re-admitted to the hospital with pneumonia.  Patient is currently on oxygen.  PT/OT evaluation is pending.  Patient states that she still does not want home health services if offered.  Patient is positive for COVID.  Patient is a non-smoker.  [place] letter provided at the bedside.  CM will continue to follow the patient for TOC needs.   Transition of Care Asessment: Insurance and Status: (P) Insurance coverage has been reviewed   Home environment has been reviewed: (P) from home with son Prior level of function:: (P) family assistance Prior/Current Home Services: (P) No current home services Social Drivers of Health Review: (P) SDOH reviewed interventions complete Readmission risk has been reviewed: (P) Yes Transition of care needs: (P) transition of care needs identified, TOC will continue to follow

## 2023-09-29 ENCOUNTER — Inpatient Hospital Stay (HOSPITAL_COMMUNITY): Payer: Medicare PPO

## 2023-09-29 DIAGNOSIS — J189 Pneumonia, unspecified organism: Secondary | ICD-10-CM | POA: Diagnosis not present

## 2023-09-29 LAB — CBC WITH DIFFERENTIAL/PLATELET
Abs Immature Granulocytes: 0.01 10*3/uL (ref 0.00–0.07)
Basophils Absolute: 0 10*3/uL (ref 0.0–0.1)
Basophils Relative: 1 %
Eosinophils Absolute: 0 10*3/uL (ref 0.0–0.5)
Eosinophils Relative: 1 %
HCT: 31.1 % — ABNORMAL LOW (ref 36.0–46.0)
Hemoglobin: 10.5 g/dL — ABNORMAL LOW (ref 12.0–15.0)
Immature Granulocytes: 0 %
Lymphocytes Relative: 31 %
Lymphs Abs: 1.2 10*3/uL (ref 0.7–4.0)
MCH: 31.3 pg (ref 26.0–34.0)
MCHC: 33.8 g/dL (ref 30.0–36.0)
MCV: 92.6 fL (ref 80.0–100.0)
Monocytes Absolute: 0.5 10*3/uL (ref 0.1–1.0)
Monocytes Relative: 12 %
Neutro Abs: 2.2 10*3/uL (ref 1.7–7.7)
Neutrophils Relative %: 55 %
Platelets: 240 10*3/uL (ref 150–400)
RBC: 3.36 MIL/uL — ABNORMAL LOW (ref 3.87–5.11)
RDW: 12.5 % (ref 11.5–15.5)
WBC: 3.9 10*3/uL — ABNORMAL LOW (ref 4.0–10.5)
nRBC: 0 % (ref 0.0–0.2)

## 2023-09-29 LAB — GLUCOSE, CAPILLARY
Glucose-Capillary: 99 mg/dL (ref 70–99)
Glucose-Capillary: 168 mg/dL — ABNORMAL HIGH (ref 70–99)
Glucose-Capillary: 129 mg/dL — ABNORMAL HIGH (ref 70–99)
Glucose-Capillary: 153 mg/dL — ABNORMAL HIGH (ref 70–99)

## 2023-09-29 LAB — BASIC METABOLIC PANEL
Anion gap: 8 (ref 5–15)
BUN: 9 mg/dL (ref 8–23)
CO2: 22 mmol/L (ref 22–32)
Calcium: 8.8 mg/dL — ABNORMAL LOW (ref 8.9–10.3)
Chloride: 111 mmol/L (ref 98–111)
Creatinine, Ser: 1.33 mg/dL — ABNORMAL HIGH (ref 0.44–1.00)
GFR, Estimated: 39 mL/min — ABNORMAL LOW (ref >60)
Glucose, Bld: 103 mg/dL — ABNORMAL HIGH (ref 70–99)
Potassium: 3.5 mmol/L (ref 3.5–5.1)
Sodium: 141 mmol/L (ref 135–145)

## 2023-09-29 MED ORDER — ONDANSETRON HCL 4 MG PO TABS
4.0000 mg | ORAL_TABLET | Freq: Four times a day (QID) | ORAL | Status: DC | PRN
  Administered 2023-09-29: 4 mg via ORAL
  Filled 2023-09-29: qty 1

## 2023-09-29 MED ORDER — ONDANSETRON HCL 4 MG/2ML IJ SOLN: 4.0000 mg | Freq: Four times a day (QID) | INTRAMUSCULAR | Status: DC | PRN

## 2023-09-29 NOTE — Plan of Care (Signed)

## 2023-09-29 NOTE — Progress Notes (Signed)
PROGRESS NOTE  Adriana Jordan  DOB: 01/13/1935  PCP: Orpah Cobb, MD ZOX:[id number]  DOA: 09/27/2023  LOS: 1 day  Hospital Day: 3  Brief narrative: Adriana Jordan is a 89 y.o. female with PMH significant for HTN, HLD, CAD, CVA, syncope, GERD, pituitary adenoma s/p surgical intervention and radiation, adrenal insufficiency on hydrocortisone. This morning, patient was brought to the ED from home by EMS for lethargy, altered mental status, hypoxia to 70s.  Most recently hospitalized 2/3 to 2/5 for near syncopal episode, lethargy.Marland Kitchen  She was noted to be COVID PCR positive.  COVID improved with supportive care.  Because of hypotension and AKI- losartan, Aldactone and torsemide were held.  Patient was discharged home to follow-up with PCP as an outpatient.  Patient lives at home with her son.  Patient reports that at baseline, she is able to ambulate, gets limited because of osteoarthritis of both knees.  Since last hospitalization, she has been less mobile and progressively weak.  Reports compliance to her medications.  However on review of her meds later, it seems patient was not aware of the diagnosis of adrenal insufficiency and is not taking hydrocortisone that she was supposed to twice daily.  2/18, patient felt weak and could not get out of her bed.  Her son called EMS noted her to be having difficulty breathing, reduced responsiveness and called EMS. On EMS arrival, her O2 sat was low in 70s.  With nonrebreather mask, oxygen saturation and mental status gradually improved.  In the ED, patient was afebrile, heart rate in 80s, blood pressure in 90s and low 100s, initially breathing on nonrebreather mask.  Currently on 4 L nasal cannula Initial labs with CBC unremarkable, BMP with potassium low at 3.4, BUN/creatinine elevated to 8/1.71, ammonia normal at 17. VBG with pH 7.4, pCO2 42, bicarb 26 Respiratory virus panel positive for COVID PCR CT chest showed mild lower lobe dependent  partial consolidations, possible atelectasis, pneumonia or aspiration.   CT head  1. No evidence of an acute intracranial abnormality. 2. Parenchymal atrophy and chronic small vessel ischemic disease 3. Paranasal sinus disease (including severe chronic right sphenoid sinusitis).   CT cervical spine 1. No evidence of an acute cervical spine fracture. 2. 2 mm C7-T1 grade 1 anterolisthesis.  Admitted to [facility]  Subjective: Patient was seen and examined this morning.   Sitting up in recliner.  Does not feel good.  Has distended abdomen. She says she is not sure when she had her last bowel movement.  Assessment/Plan: Acute respiratory failure with hypoxia Bibasilar pneumonia Per EMS O2 sat was low at 70s with them. COVID-positive but she has had positive test 3 weeks ago as well CT chest with bibasilar haziness. No fever.  WBC count and lactic acid normal.  Procalcitonin level elevated. Given the severity of her symptoms, she was started on a course of IV Rocephin and doxycycline. On 3 L oxygen by nasal this morning.   On 1 L this morning. Recent Labs  Lab 09/27/23 1042 09/27/23 1107 09/27/23 1337 09/28/23 0639 09/29/23 0532  WBC 6.4  --   --  4.0 3.9*  LATICACIDVEN  --   --  1.5  --   --   PROCALCITON  --  0.26  --   --   --    Acute metabolic encephalopathy Likely secondary to hypoxia CT head without any acute abnormality.  Showed parenchymal atrophy and chronic small vessel disease Mental status gradually improving.  Adrenal insufficiency Supposed to be  on hydrocortisone 20 mg a.m. and 10 mg p.m. it seems patient was not taking it at home after last discharge.  Serum cortisol level on 2/19 low at 1.6.  Hydrocortisone resumed  Abdominal distention Not sure when her last bowel movement was. Obtain KUB to rule out bowel obstruction.  Hypotension Blood pressure improved with IV fluid.  CKD 4 Creatinine at baseline 1.5-1.7. Recent Labs    07/30/23 1430 07/31/23 0340  08/01/23 0343 09/12/23 1030 09/12/23 1208 09/13/23 0551 09/14/23 0651 09/27/23 1042 09/28/23 0639 09/29/23 0532  BUN 14 13 17 11 10 14 14 8 10 9   CREATININE 1.69* 1.56* 1.63* 1.74* 1.73* 1.71* 1.36* 1.71* 1.81* 1.33*   CAD CVA/HLD PTA meds- aspirin 81 mg daily, Crestor 20 mg daily Continue same  Severe chronic right sphenoid sinusitis No signs tenderness. Monitor  Impaired mobility Progressive generalized weakness Patient lives at home with her son.  Patient reports that at baseline, she is able to ambulate, gets limited because of osteoarthritis of both knees.  Since last hospitalization, she has been less mobile and progressively weak. PT eval requested  Goals of care:   Code Status: Full Code    DVT prophylaxis:  enoxaparin (LOVENOX) injection 30 mg Start: 09/27/23 1600   Antimicrobials: IV Rocephin, oral doxycycline Fluid: None Consultants: None Family Communication: None at bedside  Dispo: The patient is from: Home              Anticipated d/c is to: Pending clinical course.  Hopefully home with HHPT in 1 to 2 days    Diet:  Diet Order             Diet Heart Room service appropriate? Yes; Fluid consistency: Thin  Diet effective now                   Scheduled Meds:  aspirin EC  81 mg Oral Daily   doxycycline  100 mg Oral Q12H   enoxaparin (LOVENOX) injection  30 mg Subcutaneous Q24H   hydrocortisone  10 mg Oral QPM   hydrocortisone  20 mg Oral BH-q7a   rosuvastatin  20 mg Oral QPM    PRN meds: acetaminophen **OR** acetaminophen, albuterol, bisacodyl, hydrALAZINE, ondansetron, oxyCODONE, pantoprazole, polyethylene glycol   Infusions:   cefTRIAXone (ROCEPHIN)  IV 1 g (09/29/23 1402)    Antimicrobials: Anti-infectives (From admission, onward)    Start     Dose/Rate Route Frequency Ordered Stop   09/28/23 1400  cefTRIAXone (ROCEPHIN) 1 g in sodium chloride 0.9 % 100 mL IVPB        1 g 200 mL/hr over 30 Minutes Intravenous Every 24 hours  09/27/23 1729     09/27/23 2200  doxycycline (VIBRA-TABS) tablet 100 mg        100 mg Oral Every 12 hours 09/27/23 1729     09/27/23 1430  cefTRIAXone (ROCEPHIN) 1 g in sodium chloride 0.9 % 100 mL IVPB        1 g 200 mL/hr over 30 Minutes Intravenous  Once 09/27/23 1422 09/27/23 1638   09/27/23 1430  azithromycin (ZITHROMAX) 500 mg in sodium chloride 0.9 % 250 mL IVPB        500 mg 250 mL/hr over 60 Minutes Intravenous  Once 09/27/23 1422 09/27/23 2031       Objective: Vitals:   09/29/23 1400 09/29/23 1401  BP:    Pulse:    Resp:    Temp:    SpO2: (!) 88% 92%    Intake/Output  Summary (Last 24 hours) at 09/29/2023 1433 Last data filed at 09/29/2023 1331 Gross per 24 hour  Intake 1105.61 ml  Output 1600 ml  Net -494.39 ml   Filed Weights   09/27/23 1033 09/27/23 1726  Weight: 73.9 kg 73 kg   Weight change:  Body mass index is 25.98 kg/m.   Physical Exam: General exam: Pleasant, elderly Caucasian female.  Feels weak Skin: No rashes, lesions or ulcers. HEENT: Atraumatic, normocephalic, no obvious bleeding Lungs: Clear to auscultation bilaterally,  CVS: S1, S2, no murmur,   GI/Abd: Soft, nontender, nondistended, bowel sound present,   CNS: Slow to respond but alert, awake, oriented x 3 Psychiatry: Sad affect Extremities: No pedal edema, no calf tenderness  Data Review: I have personally reviewed the laboratory data and studies available.  F/u labs  Unresulted Labs (From admission, onward)     Start     Ordered   09/29/23 0500  Basic metabolic panel  Daily,   R      09/28/23 0741   09/29/23 0500  CBC with Differential/Platelet  Daily,   R      09/28/23 0741             Total time spent in review of labs and imaging, patient evaluation, formulation of plan, documentation and communication with family: 45 minutes  Signed, Lorin Glass, MD [facility] 09/29/2023

## 2023-09-29 NOTE — Progress Notes (Signed)
PT Cancellation Note  Patient Details Name: Adriana Jordan MRN: [id number] DOB: 08-Dec-1934   Cancelled Treatment:    Reason Eval/Treat Not Completed: Patient declined, reports she is nauseous and tired.   [name] [facility] 09/29/2023, 12:03 PM Skip Mayer PT Acute [employer] [phone]

## 2023-09-29 NOTE — Progress Notes (Signed)
Attempted to wean X2(0800 &1400) pt off 2L O2 but sats dropped to 87-88% on RA.

## 2023-09-29 NOTE — Evaluation (Signed)
Occupational Therapy Evaluation Patient Details Name: Adriana Jordan MRN: [id number] DOB: 03-Nov-1934 Today's Date: 09/29/2023   History of Present Illness   Pt is 89 year old presented to [facility] on  09/27/23 for acute respiratory failure due to PNA. PMH - recent Covid, pituitary adenoma s/p surgical resection and radiation, adrenal insufficiency, CVA, arthritis, syncope, HTN, CAD with remote MI, CKD, cervical spine DDD s/p ACDF.     Clinical Impressions PTA, pt lives with son, typically ambulatory with cane d/t chronic R knee pain and reports [place] with ADLs. Pt presents now with minor deficits in cardiopulmonary endurance, dynamic standing balance and strength. Pt able to mobilize to/from bathroom using RW with CGA and manage ADLs with no more than CGA-Min A. Initiated education re: energy conservation and ADL task modification w/ further reinforcement beneficial. Feel pt would benefit from [facility] at DC though pt likely to decline these services (reports home undergoing renovations currently).   SpO2 90% on RA w/ activity     If plan is discharge home, recommend the following:   Assistance with cooking/housework;Assist for transportation;Help with stairs or ramp for entrance;A little help with bathing/dressing/bathroom     Functional Status Assessment   Patient has had a recent decline in their functional status and demonstrates the ability to make significant improvements in function in a reasonable and predictable amount of time.     Equipment Recommendations   None recommended by OT     Recommendations for Other Services         Precautions/Restrictions   Precautions Precautions: Fall;Other (comment) Precaution/Restrictions Comments: check SpO2 Restrictions Weight Bearing Restrictions Per Provider Order: No     Mobility Bed Mobility Overal bed mobility: Needs Assistance Bed Mobility: Supine to Sit     Supine to sit: Supervision, Used rails,  HOB elevated          Transfers Overall transfer level: Needs assistance Equipment used: Rolling walker (2 wheels) Transfers: Sit to/from Stand Sit to Stand: Contact guard assist, Supervision           General transfer comment: no physical assist needed to stand with RW, CGA for guiding on/off of toilet      Balance Overall balance assessment: Needs assistance Sitting-balance support: No upper extremity supported, Feet supported Sitting balance-Leahy Scale: Good     Standing balance support: Bilateral upper extremity supported, During functional activity, No upper extremity supported Standing balance-Leahy Scale: Fair Standing balance comment: able to stand at sink without UE support                           ADL either performed or assessed with clinical judgement   ADL Overall ADL's : Needs assistance/impaired Eating/Feeding: Set up;Sitting   Grooming: Supervision/safety;Standing;Wash/dry hands Grooming Details (indicate cue type and reason): standing at sink Upper Body Bathing: Set up;Sitting   Lower Body Bathing: Contact guard assist;Sit to/from stand;Sitting/lateral leans Lower Body Bathing Details (indicate cue type and reason): able to bathe peri region in standing, rinse and dry Upper Body Dressing : Set up;Sitting   Lower Body Dressing: Minimal assistance;Sitting/lateral leans;Sit to/from stand Lower Body Dressing Details (indicate cue type and reason): some assist with socks d/t R knee discomfort Toilet Transfer: Contact guard assist;Ambulation;Regular Toilet;Rolling walker (2 wheels)   Toileting- Clothing Manipulation and Hygiene: Contact guard assist;Sit to/from stand;Sitting/lateral lean Toileting - Clothing Manipulation Details (indicate cue type and reason): able to perform hygiene and manage clothing     Functional mobility  during ADLs: Contact guard assist;Rolling walker (2 wheels) General ADL Comments: Increased time/effort and minor  SOB with activities but overall close to baseline. Discussed energy conservation, ADL task modification     Vision Baseline Vision/History: 1 Wears glasses Ability to See in Adequate Light: 0 Adequate Patient Visual Report: No change from baseline Vision Assessment?: No apparent visual deficits     Perception         Praxis         Pertinent Vitals/Pain Pain Assessment Pain Assessment: Faces Faces Pain Scale: Hurts little more Pain Location: R knee Pain Descriptors / Indicators: Grimacing, Guarding, Sore Pain Intervention(s): Monitored during session, Limited activity within patient's tolerance, Heat applied, Patient requesting pain meds-RN notified, RN gave pain meds during session     Extremity/Trunk Assessment Upper Extremity Assessment Upper Extremity Assessment: Generalized weakness;Right hand dominant   Lower Extremity Assessment Lower Extremity Assessment: Defer to PT evaluation   Cervical / Trunk Assessment Cervical / Trunk Assessment: Kyphotic   Communication Communication Communication: No apparent difficulties   Cognition Arousal: Alert Behavior During Therapy: WFL for tasks assessed/performed Cognition: No family/caregiver present to determine baseline             OT - Cognition Comments: pleasant, follows directions, shows insight into problem solving ADLs and sequencing but inconsistent PLOF reports at times.                 Following commands: Intact       Cueing  General Comments   Cueing Techniques: Verbal cues      Exercises     Shoulder Instructions      Home Living Family/patient expects to be discharged to:: Private residence Living Arrangements: Children Available Help at Discharge: Family;Available 24 hours/day Type of Home: House Home Access: Stairs to enter [employer] of Steps: 3 Entrance Stairs-Rails: Right Home Layout: One level     Bathroom Shower/Tub: Producer, television/film/video:  Standard Bathroom Accessibility: Yes   Home Equipment: Agricultural consultant (2 wheels);Cane - single point;BSC/3in1          Prior Functioning/Environment Prior Level of Function : Needs assist             Mobility Comments: Pt reports using cane more than RW at home, chronic R knee pain limiting mobility ADLs Comments: reports home undergoing renovations so staying in a different bedroom- uses BSC at times d/t difficulty walking longer distance to bathroom with current setup. Can use BSC as shower chair if needed per pt. Anticipate assist needed for IADLs    OT Problem List: Decreased strength;Decreased range of motion;Decreased activity tolerance;Impaired balance (sitting and/or standing);Cardiopulmonary status limiting activity;Pain   OT Treatment/Interventions: Self-care/ADL training;Therapeutic exercise;Energy conservation;DME and/or AE instruction;Therapeutic activities;Patient/family education;Balance training      OT Goals(Current goals can be found in the care plan section)   Acute Rehab OT Goals Patient Stated Goal: decrease R knee pain (reports supposed to have surgery on it) OT Goal Formulation: With patient Time For Goal Achievement: 10/13/23 Potential to Achieve Goals: Good   OT Frequency:  Min 1X/week    Co-evaluation              AM-PAC OT "6 Clicks" Daily Activity     Outcome Measure Help from another person eating meals?: None Help from another person taking care of personal grooming?: A Little Help from another person toileting, which includes using toliet, bedpan, or urinal?: A Little Help from another person bathing (including washing, rinsing, drying)?:  A Little Help from another person to put on and taking off regular upper body clothing?: A Little Help from another person to put on and taking off regular lower body clothing?: A Little 6 Click Score: 19   End of Session Equipment Utilized During Treatment: Rolling walker (2 wheels) Nurse  Communication: Mobility status  Activity Tolerance: Patient tolerated treatment well Patient left: in chair;with call bell/phone within reach;with chair alarm set  OT Visit Diagnosis: Unsteadiness on feet (R26.81);Other abnormalities of gait and mobility (R26.89);Muscle weakness (generalized) (M62.81)                Time: 2725-3664 OT Time Calculation (min): 34 min Charges:  OT General Charges $OT Visit: 1 Visit OT Evaluation $OT Eval Low Complexity: 1 Low OT Treatments $Self Care/Home Management : 8-22 mins  Bradd Canary, OTR/L Acute Rehab Services Office: [phone]   Lorre Munroe 09/29/2023, 8:10 AM

## 2023-09-30 ENCOUNTER — Other Ambulatory Visit (HOSPITAL_COMMUNITY): Payer: Self-pay

## 2023-09-30 DIAGNOSIS — J189 Pneumonia, unspecified organism: Secondary | ICD-10-CM | POA: Diagnosis not present

## 2023-09-30 LAB — BASIC METABOLIC PANEL
Anion gap: 9 (ref 5–15)
BUN: 10 mg/dL (ref 8–23)
CO2: 24 mmol/L (ref 22–32)
Calcium: 9 mg/dL (ref 8.9–10.3)
Chloride: 108 mmol/L (ref 98–111)
Creatinine, Ser: 1.48 mg/dL — ABNORMAL HIGH (ref 0.44–1.00)
GFR, Estimated: 34 mL/min — ABNORMAL LOW (ref >60)
Glucose, Bld: 102 mg/dL — ABNORMAL HIGH (ref 70–99)
Potassium: 3.7 mmol/L (ref 3.5–5.1)
Sodium: 141 mmol/L (ref 135–145)

## 2023-09-30 LAB — CBC WITH DIFFERENTIAL/PLATELET
Abs Immature Granulocytes: 0.01 10*3/uL (ref 0.00–0.07)
Basophils Absolute: 0 10*3/uL (ref 0.0–0.1)
Basophils Relative: 1 %
Eosinophils Absolute: 0.1 10*3/uL (ref 0.0–0.5)
Eosinophils Relative: 3 %
HCT: 34.6 % — ABNORMAL LOW (ref 36.0–46.0)
Hemoglobin: 11.7 g/dL — ABNORMAL LOW (ref 12.0–15.0)
Immature Granulocytes: 0 %
Lymphocytes Relative: 42 %
Lymphs Abs: 1.5 10*3/uL (ref 0.7–4.0)
MCH: 31.1 pg (ref 26.0–34.0)
MCHC: 33.8 g/dL (ref 30.0–36.0)
MCV: 92 fL (ref 80.0–100.0)
Monocytes Absolute: 0.3 10*3/uL (ref 0.1–1.0)
Monocytes Relative: 9 %
Neutro Abs: 1.6 10*3/uL — ABNORMAL LOW (ref 1.7–7.7)
Neutrophils Relative %: 45 %
Platelets: 281 10*3/uL (ref 150–400)
RBC: 3.76 MIL/uL — ABNORMAL LOW (ref 3.87–5.11)
RDW: 12.7 % (ref 11.5–15.5)
WBC: 3.6 10*3/uL — ABNORMAL LOW (ref 4.0–10.5)
nRBC: 0 % (ref 0.0–0.2)

## 2023-09-30 LAB — GLUCOSE, CAPILLARY
Glucose-Capillary: 104 mg/dL — ABNORMAL HIGH (ref 70–99)
Glucose-Capillary: 113 mg/dL — ABNORMAL HIGH (ref 70–99)

## 2023-09-30 MED ORDER — HYDROCORTISONE 10 MG PO TABS: 10.0000 mg | ORAL_TABLET | Freq: Every evening | ORAL | 3 refills | Status: DC

## 2023-09-30 MED ORDER — HYDROCORTISONE 20 MG PO TABS: 20.0000 mg | ORAL_TABLET | ORAL | 3 refills | Status: DC

## 2023-09-30 MED ORDER — AMOXICILLIN-POT CLAVULANATE 875-125 MG PO TABS
1.0000 | ORAL_TABLET | Freq: Two times a day (BID) | ORAL | 0 refills | Status: DC
  Filled 2023-09-30: qty 10, 5d supply, fill #0

## 2023-09-30 MED ORDER — SENNOSIDES-DOCUSATE SODIUM 8.6-50 MG PO TABS: 1.0000 | ORAL_TABLET | Freq: Every day | ORAL | Status: DC

## 2023-09-30 MED ORDER — ASPIRIN 81 MG PO TBEC
81.0000 mg | DELAYED_RELEASE_TABLET | Freq: Every day | ORAL | 2 refills | Status: DC
  Filled 2023-09-30: qty 30, 30d supply, fill #0

## 2023-09-30 NOTE — Discharge Summary (Signed)
Physician Discharge Summary  Adriana Jordan XBM:[id number] DOB: 07-May-1935 DOA: 09/27/2023  PCP: Orpah Cobb, MD  Admit date: 09/27/2023 Discharge date: 09/30/2023  Admitted From: Home Discharge disposition: Home with home health PT  Recommendations at discharge:  Complete the course of antibiotics with 5 more days of oral Augmentin. Ensure compliance with hydrocortisone.   Follow-up with PCP for refills and repeat lab work  Brief narrative: Adriana Jordan is a 89 y.o. female with PMH significant for HTN, HLD, CAD, CVA, syncope, GERD, pituitary adenoma s/p surgical intervention and radiation, adrenal insufficiency on hydrocortisone. This morning, patient was brought to the ED from home by EMS for lethargy, altered mental status, hypoxia to 70s.  Most recently hospitalized 2/3 to 2/5 for near syncopal episode, lethargy.Marland Kitchen  She was noted to be COVID PCR positive.  COVID improved with supportive care.  Because of hypotension and AKI- losartan, Aldactone and torsemide were held.  Patient was discharged home to follow-up with PCP as an outpatient.  Patient lives at home with her son.  Patient reports that at baseline, she is able to ambulate, gets limited because of osteoarthritis of both knees.  Since last hospitalization, she has been less mobile and progressively weak.  Reports compliance to her medications.  However on review of her meds later, it seems patient was not aware of the diagnosis of adrenal insufficiency and is not taking hydrocortisone that she was supposed to twice daily.  2/18, patient felt weak and could not get out of her bed.  Her son called EMS noted her to be having difficulty breathing, reduced responsiveness and called EMS. On EMS arrival, her O2 sat was low in 70s.  With nonrebreather mask, oxygen saturation and mental status gradually improved.  In the ED, patient was afebrile, heart rate in 80s, blood pressure in 90s and low 100s, initially breathing on  nonrebreather mask.  Currently on 4 L nasal cannula Initial labs with CBC unremarkable, BMP with potassium low at 3.4, BUN/creatinine elevated to 8/1.71, ammonia normal at 17. VBG with pH 7.4, pCO2 42, bicarb 26 Respiratory virus panel positive for COVID PCR CT chest showed mild lower lobe dependent partial consolidations, possible atelectasis, pneumonia or aspiration.   CT head  1. No evidence of an acute intracranial abnormality. 2. Parenchymal atrophy and chronic small vessel ischemic disease 3. Paranasal sinus disease (including severe chronic right sphenoid sinusitis).   CT cervical spine 1. No evidence of an acute cervical spine fracture. 2. 2 mm C7-T1 grade 1 anterolisthesis.  Admitted to [facility]  Subjective: Patient was seen and examined this morning.   Sitting up in recliner.  Feels better than yesterday.  Abdominal distention has improved.  Not on supplemental oxygen today.  She says she was able to ambulate today without supplemental oxygen.    Hospital course: Acute respiratory failure with hypoxia Bibasilar pneumonia Per EMS O2 sat was low at 70s with them. COVID-positive but she has had positive test 3 weeks ago as well CT chest showed bibasilar haziness. No fever.  WBC count and lactic acid normal.  Procalcitonin level elevated. Given the severity of her symptoms, she was started on a course of IV Rocephin and doxycycline. Clinically improved. Initially required supplemental oxygen.  Able to ambulate in the hallway today without supplemental oxygen. Discharge on 5 more days of oral Augmentin Recent Labs  Lab 09/27/23 1042 09/27/23 1107 09/27/23 1337 09/28/23 0639 09/29/23 0532 09/30/23 0629  WBC 6.4  --   --  4.0 3.9* 3.6*  LATICACIDVEN  --   --  1.5  --   --   --   PROCALCITON  --  0.26  --   --   --   --    Acute metabolic encephalopathy Likely secondary to hypoxia CT head without any acute abnormality.  Showed parenchymal atrophy and chronic small vessel  disease Mental status gradually improved back to baseline..  Adrenal insufficiency Supposed to be on hydrocortisone 20 mg a.m. and 10 mg p.m. it seems patient was not taking it at home after last discharge.  Serum cortisol level on 2/19 low at 1.6.  Hydrocortisone resumed.  Continue same  Abdominal distention Noted yesterday.  KUB was obtained which was unremarkable.  Distention has improved.  Hypotension Blood pressure improved with IV fluid.  CKD 4 Creatinine at baseline 1.5-1.7. Recent Labs    07/31/23 0340 08/01/23 0343 09/12/23 1030 09/12/23 1208 09/13/23 0551 09/14/23 0651 09/27/23 1042 09/28/23 0639 09/29/23 0532 09/30/23 0629  BUN 13 17 11 10 14 14 8 10 9 10   CREATININE 1.56* 1.63* 1.74* 1.73* 1.71* 1.36* 1.71* 1.81* 1.33* 1.48*   CAD CVA/HLD PTA meds- aspirin 81 mg daily, Crestor 20 mg daily Continue same  Severe chronic right sphenoid sinusitis No signs tenderness. Monitor  Impaired mobility Progressive generalized weakness Patient lives at home with her son.  Patient reports that at baseline, she is able to ambulate, gets limited because of osteoarthritis of both knees.  Since last hospitalization, she has been less mobile and progressively weak. PT eval was obtained.  Home with PT recommended.  Goals of care:   Code Status: Full Code   Diet:  Diet Order             Diet general           Diet Heart Room service appropriate? Yes; Fluid consistency: Thin  Diet effective now                   Nutritional status:  Body mass index is 25.98 kg/m.       Wounds:  -    Discharge Exam:   Vitals:   09/29/23 1947 09/29/23 2301 09/30/23 0500 09/30/23 0823  BP: (!) 86/69 131/75 104/61 (!) 121/58  Pulse: 85 89 81 (!) 47  Resp: 17 17 17 18   Temp: 97.8 F (36.6 C)  98.5 F (36.9 C) 98.3 F (36.8 C)  TempSrc: Oral  Oral Oral  SpO2: (!) 85% 91%  94%  Weight:      Height:        Body mass index is 25.98 kg/m.   General exam:  Pleasant, elderly Caucasian female.  Not on supplemental oxygen today Skin: No rashes, lesions or ulcers. HEENT: Atraumatic, normocephalic, no obvious bleeding Lungs: Clear to auscultation bilaterally,  CVS: S1, S2, no murmur,   GI/Abd: Soft, nontender, nondistended, bowel sound present,   CNS: Slow to respond but alert, awake, oriented x 3 Psychiatry: Mood appropriate Extremities: No pedal edema, no calf tenderness  Follow ups:    Follow-up Information     Orpah Cobb, MD Follow up.   Specialty: Cardiology Contact information: [address] [place] [address] [phone]                 Discharge Instructions:   Discharge Instructions     Call MD for:  difficulty breathing, headache or visual disturbances   Complete by: As directed    Call MD for:  extreme fatigue   Complete by:  As directed    Call MD for:  hives   Complete by: As directed    Call MD for:  persistant dizziness or light-headedness   Complete by: As directed    Call MD for:  persistant nausea and vomiting   Complete by: As directed    Call MD for:  severe uncontrolled pain   Complete by: As directed    Call MD for:  temperature >100.4   Complete by: As directed    Diet general   Complete by: As directed    Discharge instructions   Complete by: As directed    Recommendations at discharge:   Complete the course of antibiotics with 5 more days of oral Augmentin.  Ensure compliance with hydrocortisone.    Follow-up with PCP for refills and repeat lab work  PDMP reviewed this encounter.   Opioid taper instructions: It is important to wean off of your opioid medication as soon as possible. If you do not need pain medication after your surgery it is ok to stop day one. Opioids include: Codeine, Hydrocodone(Norco, Vicodin), Oxycodone(Percocet, oxycontin) and hydromorphone amongst others.  Long term and even short term use of opiods can cause: Increased pain  response Dependence Constipation Depression Respiratory depression And more.  Withdrawal symptoms can include Flu like symptoms Nausea, vomiting And more Techniques to manage these symptoms Hydrate well Eat regular healthy meals Stay active Use relaxation techniques(deep breathing, meditating, yoga) Do Not substitute Alcohol to help with tapering If you have been on opioids for less than two weeks and do not have pain than it is ok to stop all together.  Plan to wean off of opioids This plan should start within one week post op of your joint replacement. Maintain the same interval or time between taking each dose and first decrease the dose.  Cut the total daily intake of opioids by one tablet each day Next start to increase the time between doses. The last dose that should be eliminated is the evening dose.        General discharge instructions: Follow with Primary MD Orpah Cobb, MD in 7 days  Please request your PCP  to go over your hospital tests, procedures, radiology results at the follow up. Please get your medicines reviewed and adjusted.  Your PCP may decide to repeat certain labs or tests as needed. Do not drive, operate heavy machinery, perform activities at heights, swimming or participation in water activities or provide baby sitting services if your were admitted for syncope or siezures until you have seen by Primary MD or a Neurologist and advised to do so again. [place] Controlled Substance Reporting System database was reviewed. Do not drive, operate heavy machinery, perform activities at heights, swim, participate in water activities or provide baby-sitting services while on medications for pain, sleep and mood until your outpatient physician has reevaluated you and advised to do so again.  You are strongly recommended to comply with the dose, frequency and duration of prescribed medications. Activity: As tolerated with Full fall precautions use walker/cane  & assistance as needed Avoid using any recreational substances like cigarette, tobacco, alcohol, or non-prescribed drug. If you experience worsening of your admission symptoms, develop shortness of breath, life threatening emergency, suicidal or homicidal thoughts you must seek medical attention immediately by calling 911 or calling your MD immediately  if symptoms less severe. You must read complete instructions/literature along with all the possible adverse reactions/side effects for all the medicines you take and that have  been prescribed to you. Take any new medicine only after you have completely understood and accepted all the possible adverse reactions/side effects.  Wear Seat belts while driving. You were cared for by a hospitalist during your hospital stay. If you have any questions about your discharge medications or the care you received while you were in the hospital after you are discharged, you can call the unit and ask to speak with the hospitalist or the covering physician. Once you are discharged, your primary care physician will handle any further medical issues. Please note that NO REFILLS for any discharge medications will be authorized once you are discharged, as it is imperative that you return to your primary care physician (or establish a relationship with a primary care physician if you do not have one).   Increase activity slowly   Complete by: As directed        Discharge Medications:   Allergies as of 09/30/2023       Reactions   Codeine Nausea And Vomiting   Tolerates hydrocodone        Medication List     STOP taking these medications    potassium chloride 10 MEQ tablet Commonly known as: KLOR-CON       TAKE these medications    amoxicillin-clavulanate 875-125 MG tablet Commonly known as: AUGMENTIN Take 1 tablet by mouth 2 (two) times daily for 5 days.   aspirin EC 81 MG tablet Take 1 tablet (81 mg total) by mouth daily. Swallow whole.   feeding  supplement Liqd Take 237 mLs by mouth 2 (two) times daily between meals. What changed:  when to take this reasons to take this   hydrocortisone 20 MG tablet Commonly known as: CORTEF Take 1 tablet (20 mg total) by mouth every morning.   hydrocortisone 10 MG tablet Commonly known as: CORTEF Take 1 tablet (10 mg total) by mouth every evening.   pantoprazole 40 MG tablet Commonly known as: PROTONIX Take 40 mg by mouth daily as needed (heartburn).   PreserVision AREDS 2 Caps Take 1 capsule by mouth 2 (two) times daily.   rosuvastatin 20 MG tablet Commonly known as: CRESTOR Take 20 mg by mouth every evening.   torsemide 10 MG tablet Commonly known as: DEMADEX Take 10 mg by mouth daily as needed (fluid, swelling).         The results of significant diagnostics from this hospitalization (including imaging, microbiology, ancillary and laboratory) are listed below for reference.    Procedures and Diagnostic Studies:   CT Chest Wo Contrast Result Date: 09/27/2023 CLINICAL DATA:  Respiratory failure trauma EXAM: CT CHEST WITHOUT CONTRAST TECHNIQUE: Multidetector CT imaging of the chest was performed following the standard protocol without IV contrast. RADIATION DOSE REDUCTION: This exam was performed according to the departmental dose-optimization program which includes automated exposure control, adjustment of the mA and/or kV according to patient size and/or use of iterative reconstruction technique. COMPARISON:  Chest x-ray 09/27/2023, CT chest 07/30/2023 FINDINGS: Cardiovascular: Limited evaluation without intravenous contrast. Moderate aortic atherosclerosis. No aneurysm. Coronary vascular calcification. Normal cardiac size. No significant pericardial effusion Mediastinum/Nodes: Patent trachea. No thyroid mass. No suspicious lymph nodes. Esophagus within normal limits Lungs/Pleura: No pleural effusion or pneumothorax. Mild lower lobe dependent partial consolidations. Upper Abdomen:  No acute finding Musculoskeletal: No acute osseous abnormality IMPRESSION: 1. Mild lower lobe dependent partial consolidations, possible atelectasis, pneumonia or aspiration. 2. Aortic atherosclerosis. Aortic Atherosclerosis (ICD10-I70.0). Electronically Signed   By: Jasmine Pang M.D.   On: 09/27/2023  15:51   CT Head Wo Contrast Result Date: 09/27/2023 CLINICAL DATA:  Provided history: Delirium, possible trauma. Neck trauma. EXAM: CT HEAD WITHOUT CONTRAST CT CERVICAL SPINE WITHOUT CONTRAST TECHNIQUE: Multidetector CT imaging of the head and cervical spine was performed following the standard protocol without intravenous contrast. Multiplanar CT image reconstructions of the cervical spine were also generated. RADIATION DOSE REDUCTION: This exam was performed according to the departmental dose-optimization program which includes automated exposure control, adjustment of the mA and/or kV according to patient size and/or use of iterative reconstruction technique. COMPARISON:  Head CT 07/30/2023. Brain MRI 06/21/2022. Cervical spine radiographs 09/13/2008. FINDINGS: CT HEAD FINDINGS Brain: Generalized parenchymal atrophy. Chronic lacunar infarct again noted within the left retrolenticular white matter. Patchy and ill-defined hypoattenuation elsewhere within the cerebral white matter, nonspecific but compatible with moderate chronic small vessel ischemic disease. There is no acute intracranial hemorrhage. No demarcated cortical infarct. No extra-axial fluid collection. No evidence of an intracranial mass. No midline shift. Vascular: No hyperdense vessel. Atherosclerotic calcifications. Skull: No calvarial fracture or aggressive osseous lesion. Sinuses/Orbits: No mass or acute finding within the imaged orbits. Mild mucosal thickening within the bilateral maxillary sinuses. Complete opacification of the right sphenoid sinus (with associated chronic reactive osteitis). CT CERVICAL SPINE FINDINGS Alignment: 2 mm C7-T1  grade 1 anterolisthesis. Skull base and vertebrae: The basion-dental and atlanto-dental intervals are maintained.No evidence of acute fracture to the cervical spine. Prior C3-C6 ACDF. No evidence of acute hardware compromise. Soft tissues and spinal canal: No prevertebral fluid or swelling. No visible canal hematoma. Disc levels: Prior C3-C6 ACDF. Disc degeneration at the non-operative levels, greatest at C6-C7 (advanced at this level). Small C2-C3 central disc protrusion. Posterior disc osteophyte complex at C6-C7. Multilevel uncovertebral hypertrophy. No appreciable high-grade spinal canal stenosis. Multilevel bony neural foraminal narrowing. Degenerative changes also present at the C1-C2 articulation. Upper chest: Minimal atelectasis within the dependent aspect of the left upper lobe at the imaged levels. No visible pneumothorax. IMPRESSION: CT head: 1.  No evidence of an acute intracranial abnormality. 2. Parenchymal atrophy and chronic small vessel ischemic disease, as described. 3. Paranasal sinus disease (including severe chronic right sphenoid sinusitis). CT cervical spine: 1. No evidence of an acute cervical spine fracture. 2. 2 mm C7-T1 grade 1 anterolisthesis. 3. Cervical spondylosis and post-operative changes, as described. Electronically Signed   By: Jackey Loge D.O.   On: 09/27/2023 14:41   CT Cervical Spine Wo Contrast Result Date: 09/27/2023 CLINICAL DATA:  Provided history: Delirium, possible trauma. Neck trauma. EXAM: CT HEAD WITHOUT CONTRAST CT CERVICAL SPINE WITHOUT CONTRAST TECHNIQUE: Multidetector CT imaging of the head and cervical spine was performed following the standard protocol without intravenous contrast. Multiplanar CT image reconstructions of the cervical spine were also generated. RADIATION DOSE REDUCTION: This exam was performed according to the departmental dose-optimization program which includes automated exposure control, adjustment of the mA and/or kV according to patient  size and/or use of iterative reconstruction technique. COMPARISON:  Head CT 07/30/2023. Brain MRI 06/21/2022. Cervical spine radiographs 09/13/2008. FINDINGS: CT HEAD FINDINGS Brain: Generalized parenchymal atrophy. Chronic lacunar infarct again noted within the left retrolenticular white matter. Patchy and ill-defined hypoattenuation elsewhere within the cerebral white matter, nonspecific but compatible with moderate chronic small vessel ischemic disease. There is no acute intracranial hemorrhage. No demarcated cortical infarct. No extra-axial fluid collection. No evidence of an intracranial mass. No midline shift. Vascular: No hyperdense vessel. Atherosclerotic calcifications. Skull: No calvarial fracture or aggressive osseous lesion. Sinuses/Orbits: No mass or acute finding  within the imaged orbits. Mild mucosal thickening within the bilateral maxillary sinuses. Complete opacification of the right sphenoid sinus (with associated chronic reactive osteitis). CT CERVICAL SPINE FINDINGS Alignment: 2 mm C7-T1 grade 1 anterolisthesis. Skull base and vertebrae: The basion-dental and atlanto-dental intervals are maintained.No evidence of acute fracture to the cervical spine. Prior C3-C6 ACDF. No evidence of acute hardware compromise. Soft tissues and spinal canal: No prevertebral fluid or swelling. No visible canal hematoma. Disc levels: Prior C3-C6 ACDF. Disc degeneration at the non-operative levels, greatest at C6-C7 (advanced at this level). Small C2-C3 central disc protrusion. Posterior disc osteophyte complex at C6-C7. Multilevel uncovertebral hypertrophy. No appreciable high-grade spinal canal stenosis. Multilevel bony neural foraminal narrowing. Degenerative changes also present at the C1-C2 articulation. Upper chest: Minimal atelectasis within the dependent aspect of the left upper lobe at the imaged levels. No visible pneumothorax. IMPRESSION: CT head: 1.  No evidence of an acute intracranial abnormality. 2.  Parenchymal atrophy and chronic small vessel ischemic disease, as described. 3. Paranasal sinus disease (including severe chronic right sphenoid sinusitis). CT cervical spine: 1. No evidence of an acute cervical spine fracture. 2. 2 mm C7-T1 grade 1 anterolisthesis. 3. Cervical spondylosis and post-operative changes, as described. Electronically Signed   By: Jackey Loge D.O.   On: 09/27/2023 14:41   DG Shoulder Left Result Date: 09/27/2023 CLINICAL DATA:  Pain. EXAM: LEFT SHOULDER - 2+ VIEW COMPARISON:  None Available. FINDINGS: There is no evidence of acute fracture or dislocation. Mild degenerative changes of the glenohumeral and acromioclavicular joints. Soft tissues are unremarkable. IMPRESSION: 1. No acute osseous abnormality. 2. Mild degenerative changes of the glenohumeral and acromioclavicular joints. Electronically Signed   By: Hart Robinsons M.D.   On: 09/27/2023 13:56   DG Chest Port 1 View Result Date: 09/27/2023 CLINICAL DATA:  Altered mental status and hypoxia. EXAM: PORTABLE CHEST 1 VIEW COMPARISON:  Chest radiograph dated 09/12/2023. FINDINGS: The heart size and mediastinal contours are unchanged. Aortic atherosclerosis. Improved aeration at the left lung base. Streaky opacities at the medial right lung base. No sizable pleural effusion. No pneumothorax. Anterior lower cervical fusion hardware in place. Suture anchor in the right humeral head. No acute osseous abnormality. IMPRESSION: 1. Streaky opacities at the right lung base could reflect atelectasis or infiltrate. 2. Improved aeration at the left lung base. Electronically Signed   By: Hart Robinsons M.D.   On: 09/27/2023 13:54     Labs:   Basic Metabolic Panel: Recent Labs  Lab 09/27/23 1042 09/27/23 1337 09/28/23 0639 09/29/23 0532 09/30/23 0629  NA 137 138 135 141 141  K 3.4* 3.9 3.7 3.5 3.7  CL 102  --  106 111 108  CO2 24  --  21* 22 24  GLUCOSE 120*  --  108* 103* 102*  BUN 8  --  10 9 10   CREATININE 1.71*  --   1.81* 1.33* 1.48*  CALCIUM 9.2  --  8.4* 8.8* 9.0   GFR Estimated Creatinine Clearance: 27.9 mL/min (A) (by C-G formula based on SCr of 1.48 mg/dL (H)). Liver Function Tests: Recent Labs  Lab 09/27/23 1042  AST 29  ALT 11  ALKPHOS 42  BILITOT 1.3*  PROT 6.3*  ALBUMIN 3.3*   No results for input(s): "LIPASE", "AMYLASE" in the last 168 hours. Recent Labs  Lab 09/27/23 1122  AMMONIA 17   Coagulation profile No results for input(s): "INR", "PROTIME" in the last 168 hours.  CBC: Recent Labs  Lab 09/27/23 1042 09/27/23 1337 09/28/23  1610 09/29/23 0532 09/30/23 0629  WBC 6.4  --  4.0 3.9* 3.6*  NEUTROABS 4.2  --   --  2.2 1.6*  HGB 13.8 12.9 11.0* 10.5* 11.7*  HCT 40.6 38.0 32.6* 31.1* 34.6*  MCV 92.3  --  92.4 92.6 92.0  PLT 249  --  220 240 281   Cardiac Enzymes: Recent Labs  Lab 09/27/23 1042  CKTOTAL 125   BNP: Invalid input(s): "POCBNP" CBG: Recent Labs  Lab 09/29/23 1156 09/29/23 1613 09/29/23 1949 09/30/23 0459 09/30/23 0914  GLUCAP 168* 129* 153* 104* 113*   D-Dimer No results for input(s): "DDIMER" in the last 72 hours. Hgb A1c No results for input(s): "HGBA1C" in the last 72 hours. Lipid Profile No results for input(s): "CHOL", "HDL", "LDLCALC", "TRIG", "CHOLHDL", "LDLDIRECT" in the last 72 hours. Thyroid function studies No results for input(s): "TSH", "T4TOTAL", "T3FREE", "THYROIDAB" in the last 72 hours.  Invalid input(s): "FREET3" Anemia work up No results for input(s): "VITAMINB12", "FOLATE", "FERRITIN", "TIBC", "IRON", "RETICCTPCT" in the last 72 hours. Microbiology Recent Results (from the past 240 hours)  Resp panel by RT-PCR (RSV, Flu A&B, Covid) Anterior Nasal Swab     Status: Abnormal   Collection Time: 09/27/23 10:42 AM   Specimen: Anterior Nasal Swab  Result Value Ref Range Status   SARS Coronavirus 2 by RT PCR POSITIVE (A) NEGATIVE Final   Influenza A by PCR NEGATIVE NEGATIVE Final   Influenza B by PCR NEGATIVE NEGATIVE  Final    Comment: (NOTE) The Xpert Xpress SARS-CoV-2/FLU/RSV plus assay is intended as an aid in the diagnosis of influenza from Nasopharyngeal swab specimens and should not be used as a sole basis for treatment. Nasal washings and aspirates are unacceptable for Xpert Xpress SARS-CoV-2/FLU/RSV testing.  Fact Sheet for Patients: BloggerCourse.com  Fact Sheet for Healthcare Providers: SeriousBroker.it  This test is not yet approved or cleared by the [place] FDA and has been authorized for detection and/or diagnosis of SARS-CoV-2 by FDA under an Emergency Use Authorization (EUA). This EUA will remain in effect (meaning this test can be used) for the duration of the COVID-19 declaration under Section 564(b)(1) of the Act, 21 U.S.C. section 360bbb-3(b)(1), unless the authorization is terminated or revoked.     Resp Syncytial Virus by PCR NEGATIVE NEGATIVE Final    Comment: (NOTE) Fact Sheet for Patients: BloggerCourse.com  Fact Sheet for Healthcare Providers: SeriousBroker.it  This test is not yet approved or cleared by the [place] FDA and has been authorized for detection and/or diagnosis of SARS-CoV-2 by FDA under an Emergency Use Authorization (EUA). This EUA will remain in effect (meaning this test can be used) for the duration of the COVID-19 declaration under Section 564(b)(1) of the Act, 21 U.S.C. section 360bbb-3(b)(1), unless the authorization is terminated or revoked.  Performed at [facility], [address], [place], [place] [address]     Time coordinating discharge: 45 minutes  Signed: Elajah Kunsman  [facility] 09/30/2023, 11:41 AM

## 2023-09-30 NOTE — TOC Transition Note (Signed)
Transition of Care [facility]) - Discharge Note   Patient Details  Name: Adriana Jordan MRN: [id number] Date of Birth: [birth year]  Transition of [facility]) CM/SW Contact:  Janae Bridgeman, RN Phone Number: 09/30/2023, 11:26 AM   Clinical Narrative:    CM met with the patient at the bedside and she is aware that she will likely discharge today.  Patient was agreeable to [facility].  Patient was offered Medicare choice regarding home health services and she states that she preferred [facility] or [facility].  [facility] was able to accept.  I called Zollie Scale and Kandee Keen, RNCM with [facility] accepted.         Patient Goals and CMS Choice            Discharge Placement                       Discharge Plan and Services Additional resources added to the After Visit Summary for                                       Social Drivers of Health (SDOH) Interventions SDOH Screenings   Food Insecurity: No Food Insecurity (09/27/2023)  Housing: Low Risk  (09/27/2023)  Transportation Needs: No Transportation Needs (09/27/2023)  Utilities: Not At Risk (09/27/2023)  Social Connections: Moderately Integrated (09/27/2023)  Tobacco Use: Medium Risk (09/27/2023)     Readmission Risk Interventions    02/15/2023    1:22 PM  Readmission Risk Prevention Plan  Post Dischage Appt Complete  Medication Screening Complete  Transportation Screening Complete

## 2023-09-30 NOTE — Progress Notes (Signed)
Physical Therapy Treatment Patient Details Name: Adriana Jordan MRN: [id number] DOB: May 06, 1935 Today's Date: 09/30/2023   History of Present Illness Pt is 89 year old presented to [facility] on  09/27/23 for acute respiratory failure due to PNA. PMH - recent Covid, pituitary adenoma s/p surgical resection and radiation, adrenal insufficiency, CVA, arthritis, syncope, HTN, CAD with remote MI, CKD, cervical spine DDD s/p ACDF.    PT Comments  Pt tolerates treatment well, ambulating for increased distances. Pt does have some difficulty initially standing from the bedside however this improves from the recliner after ambulation. Pt will benefit from frequent mobilization to improve activity tolerance and to maintain LE ROM and strength. PT continues to recommend HHPT.    If plan is discharge home, recommend the following: A little help with walking and/or transfers;A little help with bathing/dressing/bathroom;Assistance with cooking/housework;Assist for transportation;Help with stairs or ramp for entrance   Can travel by private vehicle        Equipment Recommendations  None recommended by PT    Recommendations for Other Services       Precautions / Restrictions Precautions Precautions: Fall;Other (comment) Recall of Precautions/Restrictions: Impaired Precaution/Restrictions Comments: check SpO2 Restrictions Weight Bearing Restrictions Per Provider Order: No     Mobility  Bed Mobility Overal bed mobility: Modified Independent                  Transfers Overall transfer level: Needs assistance Equipment used: Rolling walker (2 wheels) Transfers: Sit to/from Stand Sit to Stand: Min assist, Supervision (minA from bed, supervision from recliner)                Ambulation/Gait Ambulation/Gait assistance: Contact guard assist Gait Distance (Feet): 150 Feet Assistive device: Rolling walker (2 wheels) Gait Pattern/deviations: Step-through pattern Gait velocity: decr Gait  velocity interpretation: <1.8 ft/sec, indicate of risk for recurrent falls   General Gait Details: slowed step-through gait, reduced stride length   Stairs             Wheelchair Mobility     Tilt Bed    Modified Rankin (Stroke Patients Only)       Balance Overall balance assessment: Needs assistance Sitting-balance support: No upper extremity supported, Feet supported Sitting balance-Leahy Scale: Good     Standing balance support: Single extremity supported, Reliant on assistive device for balance Standing balance-Leahy Scale: Poor                              Communication Communication Communication: No apparent difficulties  Cognition Arousal: Alert Behavior During Therapy: WFL for tasks assessed/performed   PT - Cognitive impairments: No apparent impairments                         Following commands: Intact      Cueing Cueing Techniques: Verbal cues  Exercises      General Comments General comments (skin integrity, edema, etc.): pt on RA upon arrival, denies SOB when mobilizing      Pertinent Vitals/Pain Pain Assessment Pain Assessment: Faces Faces Pain Scale: Hurts even more Pain Location: R knee Pain Descriptors / Indicators: Aching Pain Intervention(s): Monitored during session    Home Living                          Prior Function            PT Goals (current goals  can now be found in the care plan section) Acute Rehab PT Goals Patient Stated Goal: return home Progress towards PT goals: Progressing toward goals    Frequency    Min 1X/week      PT Plan      Co-evaluation              AM-PAC PT "6 Clicks" Mobility   Outcome Measure  Help needed turning from your back to your side while in a flat bed without using bedrails?: None Help needed moving from lying on your back to sitting on the side of a flat bed without using bedrails?: None Help needed moving to and from a bed to a  chair (including a wheelchair)?: A Little Help needed standing up from a chair using your arms (e.g., wheelchair or bedside chair)?: A Little Help needed to walk in hospital room?: A Little Help needed climbing 3-5 steps with a railing? : A Little 6 Click Score: 20    End of Session Equipment Utilized During Treatment: Gait belt Activity Tolerance: Patient tolerated treatment well Patient left: in chair;with call bell/phone within reach;with chair alarm set Nurse Communication: Mobility status PT Visit Diagnosis: Unsteadiness on feet (R26.81);Other abnormalities of gait and mobility (R26.89);Muscle weakness (generalized) (M62.81);Pain Pain - Right/Left: Right Pain - part of body: Knee     Time: 1001-1026 PT Time Calculation (min) (ACUTE ONLY): 25 min  Charges:    $Gait Training: 8-22 mins $Therapeutic Activity: 8-22 mins PT General Charges $$ ACUTE PT VISIT: 1 Visit                     Arlyss Gandy, PT, DPT Acute Rehabilitation Office [phone]    Arlyss Gandy 09/30/2023, 11:08 AM

## 2023-10-01 ENCOUNTER — Observation Stay (HOSPITAL_COMMUNITY): Payer: Medicare PPO

## 2023-10-01 ENCOUNTER — Observation Stay (HOSPITAL_COMMUNITY)
Admission: EM | Admit: 2023-10-01 | Discharge: 2023-10-06 | Disposition: A | Discharge status: Skilled Nursing Facility | Payer: Medicare PPO | Attending: Family Medicine | Admitting: Family Medicine

## 2023-10-01 ENCOUNTER — Emergency Department (HOSPITAL_COMMUNITY): Payer: Medicare PPO

## 2023-10-01 ENCOUNTER — Other Ambulatory Visit: Payer: Self-pay

## 2023-10-01 ENCOUNTER — Encounter (HOSPITAL_COMMUNITY): Payer: Self-pay

## 2023-10-01 DIAGNOSIS — Z7982 Long term (current) use of aspirin: Secondary | ICD-10-CM | POA: Diagnosis not present

## 2023-10-01 DIAGNOSIS — I2510 Atherosclerotic heart disease of native coronary artery without angina pectoris: Secondary | ICD-10-CM | POA: Insufficient documentation

## 2023-10-01 DIAGNOSIS — Z9889 Other specified postprocedural states: Secondary | ICD-10-CM | POA: Insufficient documentation

## 2023-10-01 DIAGNOSIS — R519 Headache, unspecified: Secondary | ICD-10-CM | POA: Diagnosis not present

## 2023-10-01 DIAGNOSIS — R531 Weakness: Principal | ICD-10-CM | POA: Insufficient documentation

## 2023-10-01 DIAGNOSIS — I959 Hypotension, unspecified: Secondary | ICD-10-CM | POA: Diagnosis not present

## 2023-10-01 DIAGNOSIS — Z1152 Encounter for screening for COVID-19: Secondary | ICD-10-CM | POA: Diagnosis not present

## 2023-10-01 DIAGNOSIS — E785 Hyperlipidemia, unspecified: Secondary | ICD-10-CM | POA: Diagnosis not present

## 2023-10-01 DIAGNOSIS — N179 Acute kidney failure, unspecified: Secondary | ICD-10-CM | POA: Diagnosis not present

## 2023-10-01 DIAGNOSIS — M1990 Unspecified osteoarthritis, unspecified site: Secondary | ICD-10-CM | POA: Diagnosis not present

## 2023-10-01 DIAGNOSIS — Z8673 Personal history of transient ischemic attack (TIA), and cerebral infarction without residual deficits: Secondary | ICD-10-CM | POA: Insufficient documentation

## 2023-10-01 DIAGNOSIS — Z9861 Coronary angioplasty status: Secondary | ICD-10-CM | POA: Insufficient documentation

## 2023-10-01 DIAGNOSIS — I10 Essential (primary) hypertension: Secondary | ICD-10-CM | POA: Diagnosis not present

## 2023-10-01 DIAGNOSIS — J189 Pneumonia, unspecified organism: Secondary | ICD-10-CM | POA: Diagnosis not present

## 2023-10-01 DIAGNOSIS — R600 Localized edema: Secondary | ICD-10-CM | POA: Insufficient documentation

## 2023-10-01 DIAGNOSIS — R0602 Shortness of breath: Secondary | ICD-10-CM | POA: Diagnosis not present

## 2023-10-01 DIAGNOSIS — Z87891 Personal history of nicotine dependence: Secondary | ICD-10-CM | POA: Insufficient documentation

## 2023-10-01 DIAGNOSIS — E2740 Unspecified adrenocortical insufficiency: Secondary | ICD-10-CM | POA: Diagnosis not present

## 2023-10-01 DIAGNOSIS — Z85828 Personal history of other malignant neoplasm of skin: Secondary | ICD-10-CM | POA: Insufficient documentation

## 2023-10-01 DIAGNOSIS — R0902 Hypoxemia: Secondary | ICD-10-CM

## 2023-10-01 LAB — I-STAT VENOUS BLOOD GAS, ED
Acid-Base Excess: 0 mmol/L (ref 0.0–2.0)
Bicarbonate: 23.1 mmol/L (ref 20.0–28.0)
Calcium, Ion: 1.1 mmol/L — ABNORMAL LOW (ref 1.15–1.40)
HCT: 35 % — ABNORMAL LOW (ref 36.0–46.0)
Hemoglobin: 11.9 g/dL — ABNORMAL LOW (ref 12.0–15.0)
O2 Saturation: 84 %
Potassium: 3.7 mmol/L (ref 3.5–5.1)
Sodium: 140 mmol/L (ref 135–145)
TCO2: 24 mmol/L (ref 22–32)
pCO2, Ven: 30.1 mm[Hg] — ABNORMAL LOW (ref 44–60)
pH, Ven: 7.492 — ABNORMAL HIGH (ref 7.25–7.43)
pO2, Ven: 44 mm[Hg] (ref 32–45)

## 2023-10-01 LAB — HEPATIC FUNCTION PANEL
ALT: 15 U/L (ref 0–44)
AST: 32 U/L (ref 15–41)
Albumin: 3 g/dL — ABNORMAL LOW (ref 3.5–5.0)
Alkaline Phosphatase: 34 U/L — ABNORMAL LOW (ref 38–126)
Bilirubin, Direct: 0.1 mg/dL (ref 0.0–0.2)
Indirect Bilirubin: 0.6 mg/dL (ref 0.3–0.9)
Total Bilirubin: 0.7 mg/dL (ref 0.0–1.2)
Total Protein: 5.6 g/dL — ABNORMAL LOW (ref 6.5–8.1)

## 2023-10-01 LAB — CBC
HCT: 35.5 % — ABNORMAL LOW (ref 36.0–46.0)
Hemoglobin: 12.1 g/dL (ref 12.0–15.0)
MCH: 31.8 pg (ref 26.0–34.0)
MCHC: 34.1 g/dL (ref 30.0–36.0)
MCV: 93.2 fL (ref 80.0–100.0)
Platelets: 264 10*3/uL (ref 150–400)
RBC: 3.81 MIL/uL — ABNORMAL LOW (ref 3.87–5.11)
RDW: 13 % (ref 11.5–15.5)
WBC: 7.8 10*3/uL (ref 4.0–10.5)
nRBC: 0 % (ref 0.0–0.2)

## 2023-10-01 LAB — URINALYSIS, ROUTINE W REFLEX MICROSCOPIC
Bilirubin Urine: NEGATIVE
Glucose, UA: NEGATIVE mg/dL
Hgb urine dipstick: NEGATIVE
Ketones, ur: NEGATIVE mg/dL
Leukocytes,Ua: NEGATIVE
Nitrite: NEGATIVE
Protein, ur: NEGATIVE mg/dL
Specific Gravity, Urine: 1.012 (ref 1.005–1.030)
pH: 5 (ref 5.0–8.0)

## 2023-10-01 LAB — BASIC METABOLIC PANEL
Anion gap: 12 (ref 5–15)
BUN: 16 mg/dL (ref 8–23)
CO2: 22 mmol/L (ref 22–32)
Calcium: 8.6 mg/dL — ABNORMAL LOW (ref 8.9–10.3)
Chloride: 105 mmol/L (ref 98–111)
Creatinine, Ser: 1.78 mg/dL — ABNORMAL HIGH (ref 0.44–1.00)
GFR, Estimated: 27 mL/min — ABNORMAL LOW (ref >60)
Glucose, Bld: 107 mg/dL — ABNORMAL HIGH (ref 70–99)
Potassium: 3.6 mmol/L (ref 3.5–5.1)
Sodium: 139 mmol/L (ref 135–145)

## 2023-10-01 LAB — BRAIN NATRIURETIC PEPTIDE: B Natriuretic Peptide: 179.6 pg/mL — ABNORMAL HIGH (ref 0.0–100.0)

## 2023-10-01 LAB — TROPONIN I (HIGH SENSITIVITY)
Troponin I (High Sensitivity): 23 ng/L — ABNORMAL HIGH (ref <18)
Troponin I (High Sensitivity): 20 ng/L — ABNORMAL HIGH (ref <18)

## 2023-10-01 LAB — RESP PANEL BY RT-PCR (RSV, FLU A&B, COVID)  RVPGX2
Influenza A by PCR: NEGATIVE
Influenza B by PCR: NEGATIVE
Resp Syncytial Virus by PCR: NEGATIVE
SARS Coronavirus 2 by RT PCR: NEGATIVE

## 2023-10-01 LAB — I-STAT CG4 LACTIC ACID, ED: Lactic Acid, Venous: 1.7 mmol/L (ref 0.5–1.9)

## 2023-10-01 LAB — CK: Total CK: 161 U/L (ref 38–234)

## 2023-10-01 LAB — TSH: TSH: 2.544 u[IU]/mL (ref 0.350–4.500)

## 2023-10-01 LAB — LIPASE, BLOOD: Lipase: 30 U/L (ref 11–51)

## 2023-10-01 MED ORDER — AMOXICILLIN-POT CLAVULANATE 500-125 MG PO TABS
1.0000 | ORAL_TABLET | Freq: Two times a day (BID) | ORAL | Status: AC
  Administered 2023-10-01 – 2023-10-05 (×8): 1 via ORAL
  Filled 2023-10-01 (×9): qty 1

## 2023-10-01 MED ORDER — HYDROCORTISONE 20 MG PO TABS
20.0000 mg | ORAL_TABLET | ORAL | Status: DC
  Administered 2023-10-02 – 2023-10-06 (×5): 20 mg via ORAL
  Filled 2023-10-01 (×6): qty 1

## 2023-10-01 MED ORDER — PANTOPRAZOLE SODIUM 40 MG PO TBEC: 40.0000 mg | DELAYED_RELEASE_TABLET | Freq: Every day | ORAL | Status: DC | PRN

## 2023-10-01 MED ORDER — ROSUVASTATIN CALCIUM 20 MG PO TABS
20.0000 mg | ORAL_TABLET | Freq: Every evening | ORAL | Status: DC
  Administered 2023-10-01 – 2023-10-05 (×5): 20 mg via ORAL
  Filled 2023-10-01 (×6): qty 1

## 2023-10-01 MED ORDER — ASPIRIN 81 MG PO TBEC
81.0000 mg | DELAYED_RELEASE_TABLET | Freq: Every day | ORAL | Status: DC
  Administered 2023-10-01 – 2023-10-06 (×6): 81 mg via ORAL
  Filled 2023-10-01 (×6): qty 1

## 2023-10-01 MED ORDER — ACETAMINOPHEN 650 MG RE SUPP: 650.0000 mg | Freq: Four times a day (QID) | RECTAL | Status: DC | PRN

## 2023-10-01 MED ORDER — SODIUM CHLORIDE 0.9% FLUSH
3.0000 mL | Freq: Two times a day (BID) | INTRAVENOUS | Status: DC
  Administered 2023-10-01 – 2023-10-06 (×10): 3 mL via INTRAVENOUS

## 2023-10-01 MED ORDER — HYDROCORTISONE 10 MG PO TABS
10.0000 mg | ORAL_TABLET | Freq: Every evening | ORAL | Status: DC
  Administered 2023-10-01 – 2023-10-05 (×5): 10 mg via ORAL
  Filled 2023-10-01 (×7): qty 1

## 2023-10-01 MED ORDER — ENSURE ENLIVE PO LIQD: 237.0000 mL | Freq: Two times a day (BID) | ORAL | Status: DC | PRN

## 2023-10-01 MED ORDER — SODIUM CHLORIDE 0.9 % IV SOLN: INTRAVENOUS | Status: DC

## 2023-10-01 MED ORDER — MIDODRINE HCL 5 MG PO TABS
5.0000 mg | ORAL_TABLET | Freq: Two times a day (BID) | ORAL | Status: DC
  Administered 2023-10-01 – 2023-10-06 (×9): 5 mg via ORAL
  Filled 2023-10-01 (×10): qty 1

## 2023-10-01 MED ORDER — ACETAMINOPHEN 325 MG PO TABS
650.0000 mg | ORAL_TABLET | Freq: Four times a day (QID) | ORAL | Status: DC | PRN
  Administered 2023-10-01 – 2023-10-05 (×4): 650 mg via ORAL
  Filled 2023-10-01 (×4): qty 2

## 2023-10-01 MED ORDER — THIAMINE HCL 100 MG/ML IJ SOLN
500.0000 mg | INTRAVENOUS | Status: DC
  Administered 2023-10-02 – 2023-10-03 (×3): 500 mg via INTRAVENOUS
  Filled 2023-10-01 (×4): qty 5

## 2023-10-01 NOTE — ED Provider Notes (Signed)
 Care transferred to me.  Questionable atelectasis versus infiltrate on chest x-ray but no cough.  She was hypotensive into the 60s according to the EMS run sheet which came up with fluids.  No longer hypotensive.  Feels generally weak.  With her comorbidities and presentation she will need readmission and I discussed with Dr. Allena Katz.   Pricilla Loveless, MD 10/01/23 [phone]

## 2023-10-01 NOTE — ED Provider Notes (Signed)
 [place] EMERGENCY DEPARTMENT AT [facility] Provider Note   CSN: [id number] Arrival date & time: 10/01/23  1329     History  Chief Complaint  Patient presents with   Hypotension    Adriana Jordan is a 89 y.o. female.  The history is provided by the patient and medical records. No language interpreter was used.  Illness Location:  Malaise Severity:  Severe Onset quality:  Gradual Duration:  2 days Timing:  Constant Progression:  Worsening Chronicity:  Recurrent Associated symptoms: cough, fatigue, myalgias, nausea and shortness of breath   Associated symptoms: no abdominal pain, no chest pain, no congestion, no diarrhea, no fever, no headaches, no rash, no rhinorrhea, no vomiting and no wheezing        Home Medications Prior to Admission medications   Medication Sig Start Date End Date Taking? Authorizing Provider  amoxicillin-clavulanate (AUGMENTIN) 875-125 MG tablet Take 1 tablet by mouth 2 (two) times daily for 5 days. 09/30/23 10/05/23  Lorin Glass, MD  aspirin EC 81 MG tablet Take 1 tablet (81 mg total) by mouth daily. Swallow whole. 09/30/23   Dahal, Melina Schools, MD  feeding supplement (ENSURE ENLIVE / ENSURE PLUS) LIQD Take 237 mLs by mouth 2 (two) times daily between meals. Patient taking differently: Take 237 mLs by mouth 2 (two) times daily as needed (skipped meal). 02/20/23   Rinaldo Cloud, MD  hydrocortisone (CORTEF) 10 MG tablet Take 1 tablet (10 mg total) by mouth every evening. 09/30/23   Dahal, Melina Schools, MD  hydrocortisone (CORTEF) 20 MG tablet Take 1 tablet (20 mg total) by mouth every morning. 09/30/23   Dahal, Melina Schools, MD  Multiple Vitamins-Minerals (PRESERVISION AREDS 2) CAPS Take 1 capsule by mouth 2 (two) times daily.    [provider]  pantoprazole (PROTONIX) 40 MG tablet Take 40 mg by mouth daily as needed (heartburn).    [provider]  rosuvastatin (CRESTOR) 20 MG tablet Take 20 mg by mouth every evening. 10/15/22   [provider]  torsemide (DEMADEX) 10 MG tablet Take 10 mg by mouth daily as needed (fluid, swelling).    [provider]      Allergies    Codeine    Review of Systems   Review of Systems  Constitutional:  Positive for chills and fatigue. Negative for fever.  HENT:  Negative for congestion and rhinorrhea.   Respiratory:  Positive for cough and shortness of breath. Negative for chest tightness and wheezing.   Cardiovascular:  Positive for leg swelling. Negative for chest pain and palpitations.  Gastrointestinal:  Positive for nausea. Negative for abdominal pain, constipation, diarrhea and vomiting.  Genitourinary:  Negative for dysuria.  Musculoskeletal:  Positive for myalgias. Negative for back pain.  Skin:  Negative for rash and wound.  Neurological:  Positive for light-headedness. Negative for dizziness, weakness, numbness and headaches.  Psychiatric/Behavioral:  Negative for agitation and confusion.   All other systems reviewed and are negative.   Physical Exam Updated Vital Signs Ht 5\' 6"  (1.676 m)   Wt 74 kg   LMP  (LMP Unknown)   BMI 26.33 kg/m  Physical Exam Vitals and nursing note reviewed.  Constitutional:      General: She is not in acute distress.    Appearance: She is well-developed. She is ill-appearing. She is not toxic-appearing or diaphoretic.  HENT:     Head: Normocephalic and atraumatic.     Nose: No congestion or rhinorrhea.     Mouth/Throat:  Mouth: Mucous membranes are dry.     Pharynx: No oropharyngeal exudate or posterior oropharyngeal erythema.  Eyes:     Extraocular Movements: Extraocular movements intact.     Conjunctiva/sclera: Conjunctivae normal.     Pupils: Pupils are equal, round, and reactive to light.  Cardiovascular:     Rate and Rhythm: Normal rate and regular rhythm.     Pulses: Normal pulses.     Heart sounds: No murmur heard. Pulmonary:     Effort: No respiratory distress.     Breath sounds: Rhonchi and rales  present. No wheezing.  Chest:     Chest wall: No tenderness.  Abdominal:     Palpations: Abdomen is soft.     Tenderness: There is no abdominal tenderness. There is no guarding or rebound.  Musculoskeletal:        General: No swelling or tenderness.     Cervical back: Neck supple. No tenderness.     Right lower leg: Edema present.     Left lower leg: Edema present.  Skin:    General: Skin is warm and dry.     Capillary Refill: Capillary refill takes less than 2 seconds.     Findings: No erythema or rash.  Neurological:     General: No focal deficit present.     Mental Status: She is alert.  Psychiatric:        Mood and Affect: Mood normal.     ED Results / Procedures / Treatments   Labs (all labs ordered are listed, but only abnormal results are displayed) Labs Reviewed  BASIC METABOLIC PANEL - Abnormal; Notable for the following components:      Result Value   Glucose, Bld 107 (*)    Creatinine, Ser 1.78 (*)    Calcium 8.6 (*)    GFR, Estimated 27 (*)    All other components within normal limits  CBC - Abnormal; Notable for the following components:   RBC 3.81 (*)    HCT 35.5 (*)    All other components within normal limits  HEPATIC FUNCTION PANEL - Abnormal; Notable for the following components:   Total Protein 5.6 (*)    Albumin 3.0 (*)    Alkaline Phosphatase 34 (*)    All other components within normal limits  BRAIN NATRIURETIC PEPTIDE - Abnormal; Notable for the following components:   B Natriuretic Peptide 179.6 (*)    All other components within normal limits  TROPONIN I (HIGH SENSITIVITY) - Abnormal; Notable for the following components:   Troponin I (High Sensitivity) 23 (*)    All other components within normal limits  RESP PANEL BY RT-PCR (RSV, FLU A&B, COVID)  RVPGX2  LIPASE, BLOOD  TSH  URINALYSIS, ROUTINE W REFLEX MICROSCOPIC  I-STAT CG4 LACTIC ACID, ED  I-STAT CG4 LACTIC ACID, ED  TROPONIN I (HIGH SENSITIVITY)    EKG EKG  Interpretation Date/Time:  Saturday October 01 2023 13:52:01 EST Ventricular Rate:  82 PR Interval:  164 QRS Duration:  89 QT Interval:  402 QTC Calculation: 470 R Axis:   -34  Text Interpretation: Sinus rhythm Left axis deviation Borderline low voltage, extremity leads Abnormal R-wave progression, early transition when compared to prior, similar appearance. No STEMI Confirmed by Theda Belfast ([id number]) on 10/01/2023 1:56:34 PM  Radiology DG Abd 2 Views Result Date: 09/29/2023 CLINICAL DATA:  Abdominal pain. EXAM: ABDOMEN - 2 VIEW COMPARISON:  03/09/2021. Abdomen and pelvis CT dated 12/19/2021. Chest CT dated 09/27/2023 FINDINGS: Normal bowel-gas pattern. Normal amount of  stool. No free peritoneal air. Interval mildly enlarged cardiac silhouette with interval diffuse prominence of the interstitial markings as well as interval increased patchy densities in the lower lung zones bilaterally with possible small bilateral pleural effusions. Lumbar and lower thoracic spine degenerative changes. IMPRESSION: 1. No acute abnormality. 2. Interval mild cardiomegaly and changes of congestive heart failure with bibasilar atelectasis, pneumonia or alveolar edema. Electronically Signed   By: Beckie Salts M.D.   On: 09/29/2023 15:16    Procedures Procedures    Medications Ordered in ED Medications - No data to display  ED Course/ Medical Decision Making/ A&P                                 Medical Decision Making Amount and/or Complexity of Data Reviewed Labs: ordered. Radiology: ordered.    KYLA DUFFY is a 89 y.o. female with past medical history significant for GERD, hypertension, CAD, previous stroke, and currently on antibiotics for recent pneumonia where patient was admitted and discharged yesterday who presents for worsening shortness of breath, peripheral edema, nausea, vomiting, and fatigue.  According to patient, she has been feeling ill since discharge yesterday and today was found  to have some transient hypoxia.  On my evaluation, patient is now on room air and oxygen saturations are in the 90s however blood pressure is soft in the 90s.  She is reporting shortness of breath but this is denying worsened cough.  She is reporting the nausea and vomiting but is denying any constipation or diarrhea.  Denies any chest pain or abdominal pain at this time.  Denies any headache or neck pain.  Reports still feeling fatigued.  She was given some fluids by EMS.  On my exam, patient does have rales and rhonchi in her lungs.  Blood pressure is soft however she is quite edematous in her legs.  Patient reports this is new.  Patient has no murmur and had no chest or abdominal tenderness on exam.  Patient moving all extremities.  No focal neurologic deficits.  Rectal temperature was normal.  Clinically I am still concerned that this transient hypoxia and soft pressures may be related to persistent infection.  Will get screening labs, will check her again for COVID/flu/RSV given the malaise and myalgias, she may have picked up influenza as well.  Will check urinalysis and other labs.  Will get a repeat chest x-ray.  Anticipate admission with these new vital signs and recent discharge yesterday.         Final Clinical Impression(s) / ED Diagnoses Final diagnoses:  Hypotension, unspecified hypotension type  Hypoxia    Clinical Impression: 1. Hypotension, unspecified hypotension type   2. Hypoxia     Disposition: Admit  This note was prepared with assistance of Dragon voice recognition software. Occasional wrong-word or sound-a-like substitutions may have occurred due to the inherent limitations of voice recognition software.     Yaniah Thiemann, Canary Brim, MD 10/01/23 [phone]

## 2023-10-01 NOTE — H&P (Signed)
 History and Physical    Patient: Adriana Jordan:[id number] DOB: 1934-09-02 DOA: 10/01/2023 DOS: the patient was seen and examined on 10/01/2023 PCP: Orpah Cobb, MD  Patient coming from: Home Chief complaint: Chief Complaint  Patient presents with   Hypotension   HPI:  Adriana Jordan is a 89 y.o. female with past medical history  of  HTN, HLD, CAD, CVA, syncope, GERD, pituitary adenoma s/p surgical intervention and radiation, adrenal insufficiency on hydrocortisone, here for SOB and feeling weak.  Per EMS runsheet pt's initial BP was 62 and few minutes alter after bolus was 102/72.  Patient was discharged yesterday with complete course of antibiotics for her bibasilar pneumonia and hydrocortisone for her adrenal insufficiency.  Suspect patient may need placement and should consider placement for prevention of falls as she is also less mobile and has ambulatory dysfunction.  >>ED Course: In emergency room pt is oriented with her eyes closed. C/o headache in back or her head.  Vitals:   10/01/23 1450 10/01/23 1515 10/01/23 1815 10/01/23 1839  BP: (!) 94/59 110/61 125/60   Pulse: 80 81 89   Temp:    98.2 F (36.8 C)  Resp: 16 19 18    Height:      Weight:      SpO2: 100% 98% 100%   TempSrc:    Oral  BMI (Calculated):       ED evaluation  so far shows: Metabolic panel shows glucose 107 AKI 1.78 creatinine with EGFR of 27 normal LFTs. BNP of 179.6, troponin 23, lactic of 1.7. CBC shows normal white count hemoglobin and platelet count. Respiratory panel negative for flu RSV and COVID. Chest x-ray portable done today shows left basilar subsegmental atelectasis or infiltrate.   In the emergency room  pt has received the following treatment thus far: Medications  thiamine (VITAMIN B1) 500 mg in sodium chloride 0.9 % 50 mL IVPB (has no administration in time range)   Review of Systems  Respiratory:  Positive for shortness of breath.   Neurological:  Positive for dizziness  and weakness.   Past Medical History:  Diagnosis Date   Acute respiratory failure with hypoxia (HCC) 11/15/2017   AKI (acute kidney injury) (HCC) 11/15/2017   Arthritis    "all over" (11/14/2017)   Brain tumor (HCC)    "I still have it"; not cancer (11/14/2017)   Chest pain, atypical 09/11/2021   Chronic lower back pain    Community acquired pneumonia 02/14/2023   Coronary artery disease    Dr. Algie Coffer   Elevated CK 11/15/2017   GERD (gastroesophageal reflux disease)    History of blood transfusion    "don't remember why" (11/14/2017)   History of radiation therapy 07/03/2018   brain, pituitary/ 12.5 Gy in 1 fraction   Hypercholesteremia    Hypertension    Influenza A 11/14/2017   MI (myocardial infarction) (HCC) 2006   PONV (postoperative nausea and vomiting)    Skin cancer    "right forearm"   Past Surgical History:  Procedure Laterality Date   ANTERIOR CERVICAL DECOMP/DISCECTOMY FUSION     BACK SURGERY     CARPAL TUNNEL RELEASE Bilateral    CORONARY ANGIOPLASTY     DES mid LAD and mid RCA 09/21/04   CRANIOTOMY N/A 05/21/2015   Procedure: Transsphenoidal resection of pituitary tumor with Dr. Narda Bonds for approach;  Surgeon: Hilda Lias, MD;  Location: [facility] NEURO ORS;  Service: Neurosurgery;  Laterality: N/A;  Transsphenoidal resection of pituitary tumor with Dr.  Narda Bonds for approach   SHOULDER OPEN ROTATOR CUFF REPAIR Bilateral    SKIN CANCER EXCISION Right    forearm   thumb surgery Right    placed srews to make straight   THYROID SURGERY     "goiter removed"    reports that she quit smoking about 54 years ago. Her smoking use included cigarettes. She started smoking about 59 years ago. She has a 0.6 pack-year smoking history. She has never used smokeless tobacco. She reports that she does not currently use alcohol. She reports that she does not use drugs.  Allergies  Allergen Reactions   Codeine Nausea And Vomiting    Tolerates hydrocodone    Family  History  Problem Relation Age of Onset   Cancer Father 9       throat- smoker    Breast cancer Sister 65   Cancer Brother 61       lung- smoker    Prior to Admission medications   Medication Sig Start Date End Date Taking? Authorizing Provider  amoxicillin-clavulanate (AUGMENTIN) 875-125 MG tablet Take 1 tablet by mouth 2 (two) times daily for 5 days. 09/30/23 10/05/23  Lorin Glass, MD  aspirin EC 81 MG tablet Take 1 tablet (81 mg total) by mouth daily. Swallow whole. 09/30/23   Dahal, Melina Schools, MD  feeding supplement (ENSURE ENLIVE / ENSURE PLUS) LIQD Take 237 mLs by mouth 2 (two) times daily between meals. Patient taking differently: Take 237 mLs by mouth 2 (two) times daily as needed (skipped meal). 02/20/23   Rinaldo Cloud, MD  hydrocortisone (CORTEF) 10 MG tablet Take 1 tablet (10 mg total) by mouth every evening. 09/30/23   Dahal, Melina Schools, MD  hydrocortisone (CORTEF) 20 MG tablet Take 1 tablet (20 mg total) by mouth every morning. 09/30/23   Dahal, Melina Schools, MD  Multiple Vitamins-Minerals (PRESERVISION AREDS 2) CAPS Take 1 capsule by mouth 2 (two) times daily.    [provider]  pantoprazole (PROTONIX) 40 MG tablet Take 40 mg by mouth daily as needed (heartburn).    [provider]  rosuvastatin (CRESTOR) 20 MG tablet Take 20 mg by mouth every evening. 10/15/22   [provider]  torsemide (DEMADEX) 10 MG tablet Take 10 mg by mouth daily as needed (fluid, swelling).    [provider]     Vitals:   10/01/23 1450 10/01/23 1515 10/01/23 1815 10/01/23 1839  BP: (!) 94/59 110/61 125/60   Pulse: 80 81 89   Resp: 16 19 18    Temp:    98.2 F (36.8 C)  TempSrc:    Oral  SpO2: 100% 98% 100%   Weight:      Height:       Physical Exam Vitals and nursing note reviewed.  Constitutional:      General: She is not in acute distress. HENT:     Head: Normocephalic and atraumatic.     Right Ear: Hearing normal.     Left Ear: Hearing normal.     Nose: Nose  normal. No nasal deformity.     Mouth/Throat:     Lips: Pink.     Tongue: No lesions.     Pharynx: Oropharynx is clear.  Eyes:     General: Lids are normal.     Extraocular Movements: Extraocular movements intact.  Cardiovascular:     Rate and Rhythm: Normal rate and regular rhythm.     Pulses: Normal pulses.          Dorsalis pedis pulses  are 2+ on the right side and 2+ on the left side.       Posterior tibial pulses are 2+ on the right side and 2+ on the left side.     Heart sounds: Normal heart sounds.  Pulmonary:     Effort: Pulmonary effort is normal.     Breath sounds: Normal breath sounds.  Abdominal:     General: Bowel sounds are normal. There is no distension.     Palpations: Abdomen is soft. There is no mass.     Tenderness: There is no abdominal tenderness.  Musculoskeletal:     Right lower leg: No edema.     Left lower leg: No edema.  Skin:    General: Skin is warm.  Neurological:     General: No focal deficit present.     Mental Status: She is alert and oriented to person, place, and time.     Cranial Nerves: Cranial nerves 2-12 are intact.  Psychiatric:        Attention and Perception: Attention normal.        Mood and Affect: Mood normal.        Speech: Speech normal.        Behavior: Behavior normal. Behavior is cooperative.    Labs on Admission: I have personally reviewed following labs and imaging studies Results for orders placed or performed during the hospital encounter of 10/01/23 (from the past 24 hours)  Basic metabolic panel     Status: Abnormal   Collection Time: 10/01/23  1:55 PM  Result Value Ref Range   Sodium 139 135 - 145 mmol/L   Potassium 3.6 3.5 - 5.1 mmol/L   Chloride 105 98 - 111 mmol/L   CO2 22 22 - 32 mmol/L   Glucose, Bld 107 (H) 70 - 99 mg/dL   BUN 16 8 - 23 mg/dL   Creatinine, Ser 9.62 (H) 0.44 - 1.00 mg/dL   Calcium 8.6 (L) 8.9 - 10.3 mg/dL   GFR, Estimated 27 (L) >60 mL/min   Anion gap 12 5 - 15  CBC     Status: Abnormal    Collection Time: 10/01/23  1:55 PM  Result Value Ref Range   WBC 7.8 4.0 - 10.5 K/uL   RBC 3.81 (L) 3.87 - 5.11 MIL/uL   Hemoglobin 12.1 12.0 - 15.0 g/dL   HCT 95.2 (L) 84.1 - 32.4 %   MCV 93.2 80.0 - 100.0 fL   MCH 31.8 26.0 - 34.0 pg   MCHC 34.1 30.0 - 36.0 g/dL   RDW 40.1 02.7 - 25.3 %   Platelets 264 150 - 400 K/uL   nRBC 0.0 0.0 - 0.2 %  Hepatic function panel     Status: Abnormal   Collection Time: 10/01/23  1:55 PM  Result Value Ref Range   Total Protein 5.6 (L) 6.5 - 8.1 g/dL   Albumin 3.0 (L) 3.5 - 5.0 g/dL   AST 32 15 - 41 U/L   ALT 15 0 - 44 U/L   Alkaline Phosphatase 34 (L) 38 - 126 U/L   Total Bilirubin 0.7 0.0 - 1.2 mg/dL   Bilirubin, Direct 0.1 0.0 - 0.2 mg/dL   Indirect Bilirubin 0.6 0.3 - 0.9 mg/dL  Resp panel by RT-PCR (RSV, Flu A&B, Covid) Anterior Nasal Swab     Status: None   Collection Time: 10/01/23  2:10 PM   Specimen: Anterior Nasal Swab  Result Value Ref Range   SARS Coronavirus 2 by RT PCR NEGATIVE  NEGATIVE   Influenza A by PCR NEGATIVE NEGATIVE   Influenza B by PCR NEGATIVE NEGATIVE   Resp Syncytial Virus by PCR NEGATIVE NEGATIVE  I-Stat CG4 Lactic Acid     Status: None   Collection Time: 10/01/23  2:33 PM  Result Value Ref Range   Lactic Acid, Venous 1.7 0.5 - 1.9 mmol/L  Lipase, blood     Status: None   Collection Time: 10/01/23  2:50 PM  Result Value Ref Range   Lipase 30 11 - 51 U/L  Brain natriuretic peptide     Status: Abnormal   Collection Time: 10/01/23  2:50 PM  Result Value Ref Range   B Natriuretic Peptide 179.6 (H) 0.0 - 100.0 pg/mL  Troponin I (High Sensitivity)     Status: Abnormal   Collection Time: 10/01/23  2:50 PM  Result Value Ref Range   Troponin I (High Sensitivity) 23 (H) <18 ng/L  TSH     Status: None   Collection Time: 10/01/23  2:50 PM  Result Value Ref Range   TSH 2.544 0.350 - 4.500 uIU/mL  I-Stat venous blood gas, ED     Status: Abnormal   Collection Time: 10/01/23  7:05 PM  Result Value Ref Range   pH,  Ven 7.492 (H) 7.25 - 7.43   pCO2, Ven 30.1 (L) 44 - 60 mmHg   pO2, Ven 44 32 - 45 mmHg   Bicarbonate 23.1 20.0 - 28.0 mmol/L   TCO2 24 22 - 32 mmol/L   O2 Saturation 84 %   Acid-Base Excess 0.0 0.0 - 2.0 mmol/L   Sodium 140 135 - 145 mmol/L   Potassium 3.7 3.5 - 5.1 mmol/L   Calcium, Ion 1.10 (L) 1.15 - 1.40 mmol/L   HCT 35.0 (L) 36.0 - 46.0 %   Hemoglobin 11.9 (L) 12.0 - 15.0 g/dL   Sample type VENOUS   Urinalysis, Routine w reflex microscopic -Urine, Clean Catch     Status: None   Collection Time: 10/01/23  7:19 PM  Result Value Ref Range   Color, Urine YELLOW YELLOW   APPearance CLEAR CLEAR   Specific Gravity, Urine 1.012 1.005 - 1.030   pH 5.0 5.0 - 8.0   Glucose, UA NEGATIVE NEGATIVE mg/dL   Hgb urine dipstick NEGATIVE NEGATIVE   Bilirubin Urine NEGATIVE NEGATIVE   Ketones, ur NEGATIVE NEGATIVE mg/dL   Protein, ur NEGATIVE NEGATIVE mg/dL   Nitrite NEGATIVE NEGATIVE   Leukocytes,Ua NEGATIVE NEGATIVE   Recent Results (from the past 720 hours)  Blood Culture (routine x 2)     Status: None   Collection Time: 09/12/23  9:00 AM   Specimen: BLOOD  Result Value Ref Range Status   Specimen Description BLOOD SITE NOT SPECIFIED  Final   Special Requests   Final    BOTTLES DRAWN AEROBIC AND ANAEROBIC Blood Culture results may not be optimal due to an inadequate volume of blood received in culture bottles   Culture   Final    NO GROWTH 5 DAYS Performed at [facility], [address], [place], [place] [address]    Report Status 09/17/2023 FINAL  Final  Resp panel by RT-PCR (RSV, Flu A&B, Covid) Peripheral     Status: Abnormal   Collection Time: 09/12/23  9:00 AM   Specimen: Peripheral; Nasal Swab  Result Value Ref Range Status   SARS Coronavirus 2 by RT PCR POSITIVE (A) NEGATIVE Final   Influenza A by PCR NEGATIVE NEGATIVE Final   Influenza B by  PCR NEGATIVE NEGATIVE Final    Comment: (NOTE) The Xpert Xpress SARS-CoV-2/FLU/RSV plus assay is intended as an aid in the  diagnosis of influenza from Nasopharyngeal swab specimens and should not be used as a sole basis for treatment. Nasal washings and aspirates are unacceptable for Xpert Xpress SARS-CoV-2/FLU/RSV testing.  Fact Sheet for Patients: BloggerCourse.com  Fact Sheet for Healthcare Providers: SeriousBroker.it  This test is not yet approved or cleared by the [place] FDA and has been authorized for detection and/or diagnosis of SARS-CoV-2 by FDA under an Emergency Use Authorization (EUA). This EUA will remain in effect (meaning this test can be used) for the duration of the COVID-19 declaration under Section 564(b)(1) of the Act, 21 U.S.C. section 360bbb-3(b)(1), unless the authorization is terminated or revoked.     Resp Syncytial Virus by PCR NEGATIVE NEGATIVE Final    Comment: (NOTE) Fact Sheet for Patients: BloggerCourse.com  Fact Sheet for Healthcare Providers: SeriousBroker.it  This test is not yet approved or cleared by the [place] FDA and has been authorized for detection and/or diagnosis of SARS-CoV-2 by FDA under an Emergency Use Authorization (EUA). This EUA will remain in effect (meaning this test can be used) for the duration of the COVID-19 declaration under Section 564(b)(1) of the Act, 21 U.S.C. section 360bbb-3(b)(1), unless the authorization is terminated or revoked.  Performed at [facility], [address] [place], [place] [address]   Blood Culture (routine x 2)     Status: None   Collection Time: 09/12/23  9:05 AM   Specimen: BLOOD  Result Value Ref Range Status   Specimen Description BLOOD SITE NOT SPECIFIED  Final   Special Requests   Final    BOTTLES DRAWN AEROBIC AND ANAEROBIC Blood Culture results may not be optimal due to an inadequate volume of blood received in culture bottles   Culture   Final    NO GROWTH 5 DAYS Performed at [facility], [address] [place], [place] [address]    Report Status 09/17/2023 FINAL  Final  Resp panel by RT-PCR (RSV, Flu A&B, Covid) Anterior Nasal Swab     Status: Abnormal   Collection Time: 09/27/23 10:42 AM   Specimen: Anterior Nasal Swab  Result Value Ref Range Status   SARS Coronavirus 2 by RT PCR POSITIVE (A) NEGATIVE Final   Influenza A by PCR NEGATIVE NEGATIVE Final   Influenza B by PCR NEGATIVE NEGATIVE Final    Comment: (NOTE) The Xpert Xpress SARS-CoV-2/FLU/RSV plus assay is intended as an aid in the diagnosis of influenza from Nasopharyngeal swab specimens and should not be used as a sole basis for treatment. Nasal washings and aspirates are unacceptable for Xpert Xpress SARS-CoV-2/FLU/RSV testing.  Fact Sheet for Patients: BloggerCourse.com  Fact Sheet for Healthcare Providers: SeriousBroker.it  This test is not yet approved or cleared by the [place] FDA and has been authorized for detection and/or diagnosis of SARS-CoV-2 by FDA under an Emergency Use Authorization (EUA). This EUA will remain in effect (meaning this test can be used) for the duration of the COVID-19 declaration under Section 564(b)(1) of the Act, 21 U.S.C. section 360bbb-3(b)(1), unless the authorization is terminated or revoked.     Resp Syncytial Virus by PCR NEGATIVE NEGATIVE Final    Comment: (NOTE) Fact Sheet for Patients: BloggerCourse.com  Fact Sheet for Healthcare Providers: SeriousBroker.it  This test is not yet approved or cleared by the [place] FDA and has been authorized for detection and/or diagnosis  of SARS-CoV-2 by FDA under an Emergency Use Authorization (EUA). This EUA will remain in effect (meaning this test can be used) for the duration of the COVID-19 declaration under Section 564(b)(1) of the Act, 21 U.S.C. section 360bbb-3(b)(1), unless the  authorization is terminated or revoked.  Performed at [facility], [address] [place], [place] [address]   Resp panel by RT-PCR (RSV, Flu A&B, Covid) Anterior Nasal Swab     Status: None   Collection Time: 10/01/23  2:10 PM   Specimen: Anterior Nasal Swab  Result Value Ref Range Status   SARS Coronavirus 2 by RT PCR NEGATIVE NEGATIVE Final   Influenza A by PCR NEGATIVE NEGATIVE Final   Influenza B by PCR NEGATIVE NEGATIVE Final    Comment: (NOTE) The Xpert Xpress SARS-CoV-2/FLU/RSV plus assay is intended as an aid in the diagnosis of influenza from Nasopharyngeal swab specimens and should not be used as a sole basis for treatment. Nasal washings and aspirates are unacceptable for Xpert Xpress SARS-CoV-2/FLU/RSV testing.  Fact Sheet for Patients: BloggerCourse.com  Fact Sheet for Healthcare Providers: SeriousBroker.it  This test is not yet approved or cleared by the [place] FDA and has been authorized for detection and/or diagnosis of SARS-CoV-2 by FDA under an Emergency Use Authorization (EUA). This EUA will remain in effect (meaning this test can be used) for the duration of the COVID-19 declaration under Section 564(b)(1) of the Act, 21 U.S.C. section 360bbb-3(b)(1), unless the authorization is terminated or revoked.     Resp Syncytial Virus by PCR NEGATIVE NEGATIVE Final    Comment: (NOTE) Fact Sheet for Patients: BloggerCourse.com  Fact Sheet for Healthcare Providers: SeriousBroker.it  This test is not yet approved or cleared by the [place] FDA and has been authorized for detection and/or diagnosis of SARS-CoV-2 by FDA under an Emergency Use Authorization (EUA). This EUA will remain in effect (meaning this test can be used) for the duration of the COVID-19 declaration under Section 564(b)(1) of the Act, 21 U.S.C. section 360bbb-3(b)(1), unless  the authorization is terminated or revoked.  Performed at [facility], [address], [place], [place] [address]    CBC:    Latest Ref Rng & Units 10/01/2023    7:05 PM 10/01/2023    1:55 PM 09/30/2023    6:29 AM  CBC  WBC 4.0 - 10.5 K/uL  7.8  3.6   Hemoglobin 12.0 - 15.0 g/dL 09.8  11.9  14.7   Hematocrit 36.0 - 46.0 % 35.0  35.5  34.6   Platelets 150 - 400 K/uL  264  281    Basic Metabolic Panel: Recent Labs  Lab 09/27/23 1042 09/27/23 1337 09/28/23 0639 09/29/23 0532 09/30/23 0629 10/01/23 1355 10/01/23 1905  NA 137   < > 135 141 141 139 140  K 3.4*   < > 3.7 3.5 3.7 3.6 3.7  CL 102  --  106 111 108 105  --   CO2 24  --  21* 22 24 22   --   GLUCOSE 120*  --  108* 103* 102* 107*  --   BUN 8  --  10 9 10 16   --   CREATININE 1.71*  --  1.81* 1.33* 1.48* 1.78*  --   CALCIUM 9.2  --  8.4* 8.8* 9.0 8.6*  --    < > = values in this interval not displayed.   Creatinine: Lab Results  Component Value Date   CREATININE 1.78 (H) 10/01/2023  CREATININE 1.48 (H) 09/30/2023   CREATININE 1.33 (H) 09/29/2023   Liver Function Tests:    Latest Ref Rng & Units 10/01/2023    1:55 PM 09/27/2023   10:42 AM 09/13/2023    5:51 AM  Hepatic Function  Total Protein 6.5 - 8.1 g/dL 5.6  6.3  5.7   Albumin 3.5 - 5.0 g/dL 3.0  3.3  2.9   AST 15 - 41 U/L 32  29  29   ALT 0 - 44 U/L 15  11  12    Alk Phosphatase 38 - 126 U/L 34  42  42   Total Bilirubin 0.0 - 1.2 mg/dL 0.7  1.3  0.7   Bilirubin, Direct 0.0 - 0.2 mg/dL 0.1      Coagulation Profile: No results for input(s): "INR", "PROTIME" in the last 168 hours. Cardiac Enzymes: Recent Labs  Lab 09/27/23 1042  CKTOTAL 125   BNP (last 3 results) No results for input(s): "PROBNP" in the last 8760 hours. HbA1C: No results for input(s): "HGBA1C" in the last 72 hours. Lipid Profile: No results for input(s): "CHOL", "HDL", "LDLCALC", "TRIG", "CHOLHDL", "LDLDIRECT" in the last 72 hours.  Radiological Exams on Admission: DG  Chest Portable 1 View Result Date: 10/01/2023 CLINICAL DATA:  Shortness of breath. EXAM: PORTABLE CHEST 1 VIEW COMPARISON:  September 27, 2023. FINDINGS: The heart size and mediastinal contours are within normal limits. Right lung is clear. Minimal left basilar subsegmental atelectasis or infiltrate is noted. The visualized skeletal structures are unremarkable. IMPRESSION: Minimal left basilar subsegmental atelectasis or infiltrate. Electronically Signed   By: Lupita Raider M.D.   On: 10/01/2023 16:21    Data Reviewed: Relevant notes from primary care and specialist visits, past discharge summaries as available in EHR, including Care Everywhere. Prior diagnostic testing as pertinent to current admission diagnoses, Updated medications and problem lists for reconciliation ED course, including vitals, labs, imaging, treatment and response to treatment,Triage notes, nursing and pharmacy notes and ED provider's notes Notable results as noted in HPI.Discussed case with EDMD/ ED APP/ or Specialty MD on call and as needed.  Assessment and Plan: >> Generalized weakness/hypotension: Patient comes to [place] with hypotension and ongoing generalized weakness ,we will continue patient on MIVF with strict I's and O's, fall precaution continue hydrocortisone, compression stockings will start patient on midodrine.   >> Shortness of breath: VBG/ Will order a VQ scan.   >> Pneumonia: Will continue patient on her antibiotic therapy that she was discharged home with.   >> AKI: Mild AKI with a creatinine of 1.78 baseline seems around 1.1-1 0.17-1.4. Hold blood pressure medications, renally dose medications, avoid contrast studies.   >>Adrenal insufficiency: Continue patient's hydrocortisone.   >>Headache: Stat head ct [place].   DVT prophylaxis:  Scd's. Heparin once head ct is negative.   Consults:  None  Advance Care Planning:    Code Status: Full Code   Family Communication:  None  Disposition  Plan:  TBD  Severity of Illness: The appropriate patient status for this patient is OBSERVATION. Observation status is judged to be reasonable and necessary in order to provide the required intensity of service to ensure the patient's safety. The patient's presenting symptoms, physical exam findings, and initial radiographic and laboratory data in the context of their medical condition is felt to place them at decreased risk for further clinical deterioration. Furthermore, it is anticipated that the patient will be medically stable for discharge from the hospital within 2 midnights of admission.   Author:  Gertha Calkin, MD 10/01/2023 7:46 PM  For on call review [email or web address].   Unresulted Labs (From admission, onward)     Start     Ordered   10/02/23 0500  Comprehensive metabolic panel  Tomorrow morning,   R        10/01/23 1909   10/02/23 0500  CBC  Tomorrow morning,   R        10/01/23 1909   10/01/23 1801  CK  Once,   URGENT        10/01/23 1812            Orders Placed This Encounter  Procedures   Resp panel by RT-PCR (RSV, Flu A&B, Covid) Anterior Nasal Swab   DG Chest Portable 1 View   NM Pulmonary Perf and Vent   CT HEAD WO CONTRAST ( )   Basic metabolic panel   CBC   Urinalysis, Routine w reflex microscopic -Urine, Clean Catch   Hepatic function panel   Lipase, blood   Brain natriuretic peptide   TSH   CK   Comprehensive metabolic panel   CBC   Diet regular Room service appropriate? Yes; Fluid consistency: Thin   Document Height and Actual Weight   ED Cardiac monitoring   Check Rectal Temperature   Maintain IV access   Vital signs   Notify physician (specify)   Mobility Protocol: No Restrictions RN to initiate protocols based on patient's level of care   Refer to Sidebar Report Refer to ICU, Med-Surg, Progressive, and Step-Down Mobility Protocol Sidebars   Initiate Adult Central Line Maintenance and Catheter Protocol for patients with central line (CVC,  PICC, Port, Hemodialysis, Trialysis)   Daily weights   Intake and Output   Do not place and if present remove PureWick   Initiate Oral Care Protocol   Initiate Carrier Fluid Protocol   RN may order General Admission PRN Orders utilizing "General Admission PRN medications" (through manage orders) for the following patient needs: allergy symptoms (Claritin), cold sores (Carmex), cough (Robitussin DM), eye irritation (Liquifilm Tears), hemorrhoids (Tucks), indigestion (Maalox), minor skin irritation (Hydrocortisone Cream), muscle pain Romeo Apple Gay), nose irritation (saline nasal spray) and sore throat (Chloraseptic spray).   Cardiac Monitoring - Continuous Indefinite   Measure prior to patient getting out of bed for the most accurate measurements   Use the sidebar guidelines to find the proper measuring locations and sizes   Record your measurements in the flowsheet   Discontinue TED hose   In and Out Cath   Full code   Consult to hospitalist   Pulse oximetry check with vital signs   Oxygen therapy Mode or (Route): Nasal cannula; Liters Per Minute: 2; Keep O2 saturation between: greater than 92 %   I-Stat CG4 Lactic Acid   I-Stat venous blood gas, ED   EKG 12-Lead   ED EKG   Place in observation (patient's expected length of stay will be less than 2 midnights)   Aspiration precautions   Fall precautions

## 2023-10-01 NOTE — ED Triage Notes (Signed)
 Pt BIBEMS from home after being called out for generalized sickness. Pt seen yesterday and started on abx after a syncopal episode. 85% ion room air placed on oxygen and oxygen saturations increased. Pt reports sliding out of bed today but not hitting her head or losing consciousness.    After 500 NS bolus to 110/70

## 2023-10-02 DIAGNOSIS — R531 Weakness: Secondary | ICD-10-CM | POA: Diagnosis not present

## 2023-10-02 LAB — COMPREHENSIVE METABOLIC PANEL
ALT: 14 U/L (ref 0–44)
AST: 27 U/L (ref 15–41)
Albumin: 2.5 g/dL — ABNORMAL LOW (ref 3.5–5.0)
Alkaline Phosphatase: 36 U/L — ABNORMAL LOW (ref 38–126)
Anion gap: 13 (ref 5–15)
BUN: 17 mg/dL (ref 8–23)
CO2: 21 mmol/L — ABNORMAL LOW (ref 22–32)
Calcium: 8.5 mg/dL — ABNORMAL LOW (ref 8.9–10.3)
Chloride: 105 mmol/L (ref 98–111)
Creatinine, Ser: 1.56 mg/dL — ABNORMAL HIGH (ref 0.44–1.00)
GFR, Estimated: 32 mL/min — ABNORMAL LOW (ref >60)
Glucose, Bld: 97 mg/dL (ref 70–99)
Potassium: 3.6 mmol/L (ref 3.5–5.1)
Sodium: 139 mmol/L (ref 135–145)
Total Bilirubin: 0.8 mg/dL (ref 0.0–1.2)
Total Protein: 5.1 g/dL — ABNORMAL LOW (ref 6.5–8.1)

## 2023-10-02 LAB — CBC
HCT: 33.2 % — ABNORMAL LOW (ref 36.0–46.0)
Hemoglobin: 11.2 g/dL — ABNORMAL LOW (ref 12.0–15.0)
MCH: 31.1 pg (ref 26.0–34.0)
MCHC: 33.7 g/dL (ref 30.0–36.0)
MCV: 92.2 fL (ref 80.0–100.0)
Platelets: 244 10*3/uL (ref 150–400)
RBC: 3.6 MIL/uL — ABNORMAL LOW (ref 3.87–5.11)
RDW: 13 % (ref 11.5–15.5)
WBC: 7.5 10*3/uL (ref 4.0–10.5)
nRBC: 0 % (ref 0.0–0.2)

## 2023-10-02 MED ORDER — SODIUM CHLORIDE 0.9% FLUSH: 3.0000 mL | INTRAVENOUS | Status: DC | PRN

## 2023-10-02 MED ORDER — LACTATED RINGERS IV BOLUS
500.0000 mL | Freq: Once | INTRAVENOUS | Status: AC
  Administered 2023-10-02: 500 mL via INTRAVENOUS

## 2023-10-02 MED ORDER — IBUPROFEN 400 MG PO TABS
600.0000 mg | ORAL_TABLET | Freq: Once | ORAL | Status: AC
  Administered 2023-10-02: 600 mg via ORAL
  Filled 2023-10-02: qty 1

## 2023-10-02 NOTE — Plan of Care (Addendum)
 Admitted from ED ; alert and oriented x 3 ; disoriented to time and day ; noted with minimal dyspnea on exertion, keep on nasal cannula at 2 LPM ; patient expressed pain to her right knee and limited movement on bilateral arms due to surgery in the past ; able to turn side to side ; bed locked in lowest position ; sacral foam initiated ; bed locked in lowest position ; will continue to monitor  Problem: Education: Goal: Knowledge of risk factors and measures for prevention of condition will improve Outcome: Progressing   Problem: Coping: Goal: Psychosocial and spiritual needs will be supported Outcome: Progressing   Problem: Respiratory: Goal: Will maintain a patent airway Outcome: Progressing Goal: Complications related to the disease process, condition or treatment will be avoided or minimized Outcome: Progressing   Problem: Education: Goal: Knowledge of General Education information will improve Description: Including pain rating scale, medication(s)/side effects and non-pharmacologic comfort measures Outcome: Progressing   Problem: Health Behavior/Discharge Planning: Goal: Ability to manage health-related needs will improve Outcome: Progressing   Problem: Clinical Measurements: Goal: Ability to maintain clinical measurements within normal limits will improve Outcome: Progressing Goal: Will remain free from infection Outcome: Progressing Goal: Diagnostic test results will improve Outcome: Progressing Goal: Respiratory complications will improve Outcome: Progressing Goal: Cardiovascular complication will be avoided Outcome: Progressing   Problem: Activity: Goal: Risk for activity intolerance will decrease Outcome: Progressing   Problem: Nutrition: Goal: Adequate nutrition will be maintained Outcome: Progressing   Problem: Coping: Goal: Level of anxiety will decrease Outcome: Progressing   Problem: Elimination: Goal: Will not experience complications related to  bowel motility Outcome: Progressing Goal: Will not experience complications related to urinary retention Outcome: Progressing   Problem: Pain Managment: Goal: General experience of comfort will improve and/or be controlled Outcome: Progressing   Problem: Safety: Goal: Ability to remain free from injury will improve Outcome: Progressing   Problem: Skin Integrity: Goal: Risk for impaired skin integrity will decrease Outcome: Progressing

## 2023-10-02 NOTE — Care Management Obs Status (Signed)
 MEDICARE OBSERVATION STATUS NOTIFICATION   Patient Details  Name: Adriana Jordan MRN: [id number] Date of Birth: March 02, 1935   Medicare Observation Status Notification Given:  Yes    Isaias Cowman, RN 10/02/2023, 4:58 PM

## 2023-10-02 NOTE — Progress Notes (Signed)
  Progress Note   Patient: Adriana Jordan UEA:[id number] DOB: [birth year] DOA: 10/01/2023     0 DOS: the patient was seen and examined on 10/02/2023   Brief hospital course:  Adriana Jordan is a 89 y.o. female with past medical history  of  HTN, HLD, CAD, CVA, syncope, GERD, pituitary adenoma s/p surgical intervention and radiation, adrenal insufficiency on hydrocortisone, here for SOB and feeling weak.   Assessment and Plan:  Physical debilitation muscle weakness - Exacerbated by acute illness, physical debilitation, osteoarthritis pain.  Patient stated that when she got home she was increasingly weak and could not perform her ADLs as expected.  Was having increased pain in her knees and shoulders.  Recently discharged, previous evaluation recommending home health PT.  Will reorder PT eval.  Discuss with patient, case management on disposition planning.  Community-acquired pneumonia (POA) - Continue p.o. Augmentin.  Was hospitalized 2/18-2/21 for pneumonia, sent home with Augmentin.  Does not appear to be having any worsening fever, hypoxia, cough.  Reported dyspnea - VQ scan pending.  Likely secondary to above.  Does not currently appear to be hypoxic, on 2 L nasal cannula satting at 100%.  Will attempt to remove show wean.  Acute kidney injury - BUN and creatinine mildly above baseline.  IV fluid hydration on board.  Recheck kidney function in AM.  Adrenal insufficiency - Resume patient's hydrocortisone.  No need for pulse dosing given no acute illness this hospitalization.  Osteoarthritis - Patient with noted right knee pain and surgical history of bilateral shoulders.  Contributes to patient's debilitation and pain.  Will attempt topical NSAIDs.  Hold p.o. given AKI.    Goals of care - Given patient was just discharged 2/21 and was too weak to be at home, likely will be reevaluated and assessed for short-term rehab.  Work closely with case management on disposition planning.      Subjective: Patient resting comfortably this morning.  Still complaining of intermittent pain in her right knee and decreased range of motion her left shoulder which appears to be somewhat chronic.  No worsening shortness of breath, fever, chills, chest pain, nausea, vomiting, abdominal pain.  Physical Exam: Vitals:   10/02/23 0800 10/02/23 0844 10/02/23 1030 10/02/23 1304  BP: (!) 99/58  130/76   Pulse: 79  78   Resp: 14  14   Temp:  98.1 F (36.7 C)  98.3 F (36.8 C)  TempSrc:  Oral  Oral  SpO2: 96%  100%   Weight:      Height:       GENERAL:  Alert, pleasant, no acute distress, frail HEENT:  EOMI CARDIOVASCULAR:  RRR, no murmurs appreciated RESPIRATORY: Cough, clear to auscultation, no wheezing, rales, or rhonchi GASTROINTESTINAL:  Soft, nontender, nondistended EXTREMITIES: Thin, no LE edema bilaterally NEURO:  No new focal deficits appreciated SKIN: Dry, no rashes noted PSYCH:  Appropriate mood and affect, anxious  Data Reviewed:  There are no new results to review at this time.  Family Communication: None at bedside  Disposition: Status is: Observation The patient remains OBS appropriate and will d/c before 2 midnights.  Planned Discharge Destination: Skilled nursing facility    Time spent: 32 minutes  Author: Deanna Artis, DO 10/02/2023 1:06 PM  For on call review [email or web address].

## 2023-10-03 ENCOUNTER — Ambulatory Visit (HOSPITAL_COMMUNITY): Admission: RE | Admit: 2023-10-03 | Payer: Medicare PPO | Source: Ambulatory Visit

## 2023-10-03 DIAGNOSIS — R531 Weakness: Secondary | ICD-10-CM | POA: Diagnosis not present

## 2023-10-03 LAB — CBC
HCT: 31.3 % — ABNORMAL LOW (ref 36.0–46.0)
Hemoglobin: 10.5 g/dL — ABNORMAL LOW (ref 12.0–15.0)
MCH: 30.7 pg (ref 26.0–34.0)
MCHC: 33.5 g/dL (ref 30.0–36.0)
MCV: 91.5 fL (ref 80.0–100.0)
Platelets: 256 10*3/uL (ref 150–400)
RBC: 3.42 MIL/uL — ABNORMAL LOW (ref 3.87–5.11)
RDW: 13.1 % (ref 11.5–15.5)
WBC: 6.7 10*3/uL (ref 4.0–10.5)
nRBC: 0 % (ref 0.0–0.2)

## 2023-10-03 LAB — BASIC METABOLIC PANEL
Anion gap: 9 (ref 5–15)
BUN: 17 mg/dL (ref 8–23)
CO2: 22 mmol/L (ref 22–32)
Calcium: 8.2 mg/dL — ABNORMAL LOW (ref 8.9–10.3)
Chloride: 106 mmol/L (ref 98–111)
Creatinine, Ser: 1.42 mg/dL — ABNORMAL HIGH (ref 0.44–1.00)
GFR, Estimated: 36 mL/min — ABNORMAL LOW (ref >60)
Glucose, Bld: 92 mg/dL (ref 70–99)
Potassium: 3.4 mmol/L — ABNORMAL LOW (ref 3.5–5.1)
Sodium: 137 mmol/L (ref 135–145)

## 2023-10-03 LAB — MAGNESIUM: Magnesium: 1.6 mg/dL — ABNORMAL LOW (ref 1.7–2.4)

## 2023-10-03 MED ORDER — HEPARIN SODIUM (PORCINE) 5000 UNIT/ML IJ SOLN
5000.0000 [IU] | Freq: Three times a day (TID) | INTRAMUSCULAR | Status: DC
  Administered 2023-10-03 – 2023-10-06 (×9): 5000 [IU] via SUBCUTANEOUS
  Filled 2023-10-03 (×9): qty 1

## 2023-10-03 MED ORDER — LIDOCAINE 5 % EX PTCH
1.0000 | MEDICATED_PATCH | CUTANEOUS | Status: DC
  Administered 2023-10-03 – 2023-10-05 (×3): 1 via TRANSDERMAL
  Filled 2023-10-03 (×4): qty 1

## 2023-10-03 MED ORDER — DICLOFENAC SODIUM 1 % EX GEL
2.0000 g | Freq: Four times a day (QID) | CUTANEOUS | Status: DC
Start: 2023-10-03 — End: 2023-10-06
  Administered 2023-10-03 – 2023-10-06 (×12): 2 g via TOPICAL
  Filled 2023-10-03: qty 100

## 2023-10-03 MED ORDER — DEXTROSE-SODIUM CHLORIDE 5-0.45 % IV SOLN: INTRAVENOUS | Status: AC

## 2023-10-03 NOTE — Progress Notes (Signed)
  Progress Note   Patient: Adriana Jordan ZOX:[id number] DOB: [birth year] DOA: 10/01/2023     0 DOS: the patient was seen and examined on 10/03/2023   Brief hospital course:  Adriana Jordan is a 89 y.o. female with past medical history  of  HTN, HLD, CAD, CVA, syncope, GERD, pituitary adenoma s/p surgical intervention and radiation, adrenal insufficiency on hydrocortisone, here for SOB and feeling weak.   Assessment and Plan:  Physical debilitation muscle weakness - Exacerbated by acute illness, physical debilitation, osteoarthritis pain.  Patient stated that when she got home she was increasingly weak and could not perform her ADLs as expected.  Was having increased pain in her knees and shoulders.  Recently discharged, previous evaluation recommending home health PT.  Pending physical therapy evaluation.  Discuss with patient, case management on disposition planning.  Community-acquired pneumonia (POA) - Continue p.o. Augmentin.  Was hospitalized 2/18-2/21 for pneumonia, sent home with Augmentin.  Does not appear to be having any worsening fever, hypoxia, cough.  Reported dyspnea - No ongoing hypoxia, will DC ordered VQ scan.  Does not currently appear to be hypoxic, on 2 L nasal cannula satting at 100%.  Will attempt to remove/wean.  Acute kidney injury - BUN and creatinine mildly above baseline.  IV fluid hydration on board.  Recheck kidney function in AM.  Will initiate on IV fluids at 50 mL/h.  Likely contributing to patient's weakness.  Adrenal insufficiency - Resume patient's hydrocortisone.  No need for pulse dosing given no acute illness this hospitalization.  Osteoarthritis - Patient with noted right knee pain and surgical history of bilateral shoulders.  Contributes to patient's debilitation and pain.  Will attempt topical NSAIDs.  Hold p.o. given AKI.  Will order lidocaine patch for right knee.  Goals of care - Given patient was just discharged 2/21 and was too weak to be  at home, likely will be reevaluated and assessed for short-term rehab.  Work closely with case management on disposition planning.     Subjective: Patient resting comfortably this morning.  Still complaining of intermittent pain in her right knee and decreased range of motion her left shoulder which appears to be somewhat chronic.  No worsening shortness of breath, fever, chills, chest pain, nausea, vomiting, abdominal pain.  Physical therapy had not yet seen her this morning.  Physical Exam: Vitals:   10/02/23 2349 10/03/23 0444 10/03/23 0452 10/03/23 0813  BP: (!) 112/49 121/67  (!) 131/57  Pulse: 68 74  69  Resp: 18 18  17   Temp: 98.7 F (37.1 C) 98.5 F (36.9 C)  97.9 F (36.6 C)  TempSrc: Oral Oral  Oral  SpO2: 96% 95%  98%  Weight:   76.9 kg   Height:       GENERAL:  Alert, pleasant, no acute distress, frail HEENT:  EOMI CARDIOVASCULAR:  RRR, no murmurs appreciated RESPIRATORY: Cough, clear to auscultation, no wheezing, rales, or rhonchi GASTROINTESTINAL:  Soft, nontender, nondistended EXTREMITIES: Thin, no LE edema bilaterally NEURO:  No new focal deficits appreciated SKIN: Dry, no rashes noted PSYCH:  Appropriate mood and affect, anxious  Data Reviewed:  There are no new results to review at this time.  Family Communication: None at bedside  Disposition: Status is: Observation The patient remains OBS appropriate and will d/c before 2 midnights.  Planned Discharge Destination: Skilled nursing facility    Time spent: 31 minutes  Author: Deanna Artis, DO 10/03/2023 12:07 PM  For on call review [email or web address].

## 2023-10-03 NOTE — Evaluation (Signed)
 Physical Therapy Evaluation Patient Details Name: Adriana Jordan MRN: [id number] DOB: 04-14-1935 Today's Date: 10/03/2023  History of Present Illness  Patient is a 89 year old female admitted with hypotension and generalized weakness after recent discharge from hospital with respiratory failure and pneumonia. History of  HTN, HLD, CAD, CVA, syncope, GERD, pituitary adenoma, adrenal insufficiency  Clinical Impression  Patient is agreeable to PT evaluation. She reports difficulty managing at home after recent hospitalization.  Today the patient requires assistance for mobility efforts. She was able to stand with maximal assistance. Standing tolerance limited by generalized weakness and pain in the right leg. Sp02 98% on 2 L02 with no dyspnea noted. She is requiring increased assistance with all mobility compared to recent hospitalization. Recommend to continue PT to maximize independence and facilitate return to prior level of function. Rehabilitation < 3 hours/day recommended after this hospital stay.       If plan is discharge home, recommend the following: A lot of help with walking and/or transfers;A lot of help with bathing/dressing/bathroom;Help with stairs or ramp for entrance;Assist for transportation;Assistance with cooking/housework   Can travel by private vehicle   No    Equipment Recommendations None recommended by PT  Recommendations for Other Services       Functional Status Assessment Patient has had a recent decline in their functional status and demonstrates the ability to make significant improvements in function in a reasonable and predictable amount of time.     Precautions / Restrictions Precautions Precautions: Fall Restrictions Weight Bearing Restrictions Per Provider Order: No      Mobility  Bed Mobility Overal bed mobility: Needs Assistance Bed Mobility: Supine to Sit, Sit to Supine     Supine to sit: Max assist Sit to supine: Max assist   General  bed mobility comments: assistance for BLE and trunk support. increased time and effort required. cues for technique    Transfers Overall transfer level: Needs assistance Equipment used: Rolling walker (2 wheels) Transfers: Sit to/from Stand Sit to Stand: Max assist           General transfer comment: lifting assistance required for standing from bed    Ambulation/Gait               General Gait Details: walking not attempted due to fatigue with activity and right leg pain  Stairs            Wheelchair Mobility     Tilt Bed    Modified Rankin (Stroke Patients Only)       Balance Overall balance assessment: Needs assistance Sitting-balance support: Feet supported Sitting balance-Leahy Scale: Fair     Standing balance support: Bilateral upper extremity supported Standing balance-Leahy Scale: Poor Standing balance comment: relying on rolling walker in standing. standing tolerance less than 1 minute (limited by pain and fatigue)                             Pertinent Vitals/Pain Pain Assessment Pain Assessment: Faces Faces Pain Scale: Hurts even more Pain Location: R knee, R hip, L shoulder Pain Descriptors / Indicators: Discomfort Pain Intervention(s): Limited activity within patient's tolerance, Monitored during session, Repositioned    Home Living Family/patient expects to be discharged to:: Private residence Living Arrangements: Children Available Help at Discharge: Family;Available 24 hours/day Type of Home: House Home Access: Stairs to enter Entrance Stairs-Rails: Right Entrance Stairs-Number of Steps: 3   Home Layout: One level Home Equipment: Agricultural consultant (  2 wheels);Cane - single point;BSC/3in1      Prior Function Prior Level of Function : Needs assist             Mobility Comments: prior to recent admission, reports using cane more than RW at home, chronic R knee pain limiting mobility ADLs Comments: intermittent  assistance required     Extremity/Trunk Assessment   Upper Extremity Assessment Upper Extremity Assessment: Generalized weakness    Lower Extremity Assessment Lower Extremity Assessment: Generalized weakness       Communication   Communication Communication: No apparent difficulties Factors Affecting Communication: Hearing impaired    Cognition Arousal: Alert Behavior During Therapy: WFL for tasks assessed/performed   PT - Cognitive impairments: Memory, No family/caregiver present to determine baseline                       PT - Cognition Comments: patient has some confusion about recent events leading up to this hospital stay Following commands: Impaired Following commands impaired: Follows one step commands with increased time     Cueing Cueing Techniques: Verbal cues     General Comments General comments (skin integrity, edema, etc.): Sp02 98% on 2 L02 with no dyspnea noted    Exercises     Assessment/Plan    PT Assessment Patient needs continued PT services  PT Problem List Decreased strength;Decreased range of motion;Decreased activity tolerance;Decreased balance;Decreased mobility;Decreased safety awareness;Decreased cognition;Pain       PT Treatment Interventions DME instruction;Gait training;Stair training;Functional mobility training;Therapeutic activities;Balance training;Therapeutic exercise;Neuromuscular re-education;Patient/family education;Cognitive remediation;Wheelchair mobility training    PT Goals (Current goals can be found in the Care Plan section)  Acute Rehab PT Goals Patient Stated Goal: to return home if possible PT Goal Formulation: With patient Time For Goal Achievement: 10/17/23 Potential to Achieve Goals: Fair    Frequency Min 1X/week     Co-evaluation               AM-PAC PT "6 Clicks" Mobility  Outcome Measure Help needed turning from your back to your side while in a flat bed without using bedrails?: A  Lot Help needed moving from lying on your back to sitting on the side of a flat bed without using bedrails?: A Lot Help needed moving to and from a bed to a chair (including a wheelchair)?: A Lot Help needed standing up from a chair using your arms (e.g., wheelchair or bedside chair)?: A Lot Help needed to walk in hospital room?: A Little Help needed climbing 3-5 steps with a railing? : A Lot 6 Click Score: 13    End of Session Equipment Utilized During Treatment: Oxygen Activity Tolerance: Patient limited by fatigue Patient left: in bed;with call bell/phone within reach   PT Visit Diagnosis: Unsteadiness on feet (R26.81);Muscle weakness (generalized) (M62.81)    Time: 1191-4782 PT Time Calculation (min) (ACUTE ONLY): 21 min   Charges:   PT Evaluation $PT Eval Moderate Complexity: 1 Mod   PT General Charges $$ ACUTE PT VISIT: 1 Visit        Donna Bernard, PT, MPT   Ina Homes 10/03/2023, 11:29 AM

## 2023-10-03 NOTE — Progress Notes (Signed)
 Mobility Specialist: Progress Note   10/03/23 1640  Mobility  Range of Motion/Exercises Active Assistive;Right leg;Left leg  Activity Response Tolerated poorly  Mobility Referral Yes  Mobility visit 1 Mobility  Mobility Specialist Start Time (ACUTE ONLY) 1423  Mobility Specialist Stop Time (ACUTE ONLY) 1432  Mobility Specialist Time Calculation (min) (ACUTE ONLY) 9 min    Pt declining OOB mobility d/t c/o pain all over her body especially in her R knee. Agreeable to bed level exercises. Both legs (R>L) requiring assistance from MS - completed leg lifts, lying marches, and ankle pumps. Also completed chest press with R arm  but unable to continue after two d/t fatigue and pain. Unable to completed arm exercise on LUE. Left in bed with all needs met, call bell in reach. Bed alarm on. Visitor in room.   Maurene Capes Mobility Specialist Please contact via SecureChat or Rehab office at [phone]

## 2023-10-03 NOTE — [place] FL2 (Signed)
 [place] MEDICAID FL2 LEVEL OF CARE FORM     IDENTIFICATION  Patient Name: Adriana Jordan Birthdate: 01/18/1935 Sex: female Admission Date (Current Location): 10/01/2023  [facility] and [place] Number:  Producer, television/film/video and Address:  [facility] [place], [address] [place], [place] [address]      Provider Number: [id number]  Attending Physician Name and Address:  Deanna Artis, DO  Relative Name and Phone Number:  Wilhemina Bonito (Son)  [phone]    Current Level of Care: Hospital Recommended Level of Care: Skilled Nursing Facility Prior Approval Number:    Date Approved/Denied:   PASRR Number: [id number]  Discharge Plan: SNF    Current Diagnoses: Patient Active Problem List   Diagnosis Date Noted   Pneumonia 09/27/2023   CAD S/P percutaneous coronary angioplasty 09/12/2023   Hypotension 09/12/2023   Near syncope 09/12/2023   Syncope 07/30/2023   Headache 06/21/2022   Adrenal insufficiency (HCC) 03/05/2022   Protein-calorie malnutrition, severe 03/03/2022   Hypokalemia 03/01/2022   Generalized weakness 03/01/2022   Protein-calorie malnutrition, moderate (HCC) 03/01/2022   Multiple falls 03/01/2022   Tick bite 03/01/2022   Hyponatremia 02/21/2022   History of CVA (cerebrovascular accident) 10/11/2019   Leukopenia 11/15/2017   Hypertension 11/14/2017   Hyperlipidemia 11/14/2017   GERD (gastroesophageal reflux disease) 11/14/2017   Pituitary adenoma (HCC) 04/07/2015    Orientation RESPIRATION BLADDER Height & Weight     Self, Time, Situation, Place  O2 (2L) Incontinent Weight: 169 lb 8.5 oz (76.9 kg) Height:  5\' 6"  (167.6 cm)  BEHAVIORAL SYMPTOMS/MOOD NEUROLOGICAL BOWEL NUTRITION STATUS      Continent Diet (see DC summary)  AMBULATORY STATUS COMMUNICATION OF NEEDS Skin   Extensive Assist Verbally Normal                       Personal Care Assistance Level of Assistance  Bathing, Feeding, Dressing Bathing Assistance:  Maximum assistance Feeding assistance: Independent Dressing Assistance: Maximum assistance     Functional Limitations Info  Sight, Hearing, Speech Sight Info: Impaired (wears glasses) Hearing Info: Adequate Speech Info: Adequate    SPECIAL CARE FACTORS FREQUENCY  PT (By licensed PT), OT (By licensed OT)     PT Frequency: 5x/week OT Frequency: 5x/week            Contractures Contractures Info: Not present    Additional Factors Info  Code Status, Allergies Code Status Info: FULL Allergies Info: Codeine           Current Medications (10/03/2023):  This is the current hospital active medication list Current Facility-Administered Medications  Medication Dose Route Frequency Provider Last Rate Last Admin   acetaminophen (TYLENOL) tablet 650 mg  650 mg Oral Q6H PRN Gertha Calkin, MD   650 mg at 10/03/23 8469   Or   acetaminophen (TYLENOL) suppository 650 mg  650 mg Rectal Q6H PRN Gertha Calkin, MD       amoxicillin-clavulanate (AUGMENTIN) 500-125 MG per tablet 1 tablet  1 tablet Oral BID Gertha Calkin, MD   1 tablet at 10/03/23 6295   aspirin EC tablet 81 mg  81 mg Oral Daily Gertha Calkin, MD   81 mg at 10/03/23 0853   dextrose 5 % and 0.45 % NaCl infusion   Intravenous Continuous Deanna Artis, DO 50 mL/hr at 10/03/23 1141 New Bag at 10/03/23 1141   diclofenac Sodium (VOLTAREN) 1 % topical gel 2 g  2 g Topical QID Sharlene Dory,  Wylie Hail, DO       feeding supplement (ENSURE ENLIVE / ENSURE PLUS) liquid 237 mL  237 mL Oral BID PRN Gertha Calkin, MD       heparin injection 5,000 Units  5,000 Units Subcutaneous Q8H Toni Amend W, DO       hydrocortisone (CORTEF) tablet 10 mg  10 mg Oral QPM Gertha Calkin, MD   10 mg at 10/02/23 1844   hydrocortisone (CORTEF) tablet 20 mg  20 mg Oral Shirleen Schirmer, Tanna Furry V, MD   20 mg at 10/03/23 0816   lidocaine (LIDODERM) 5 % 1 patch  1 patch Transdermal Q24H Toni Amend W, DO       midodrine (PROAMATINE) tablet 5 mg  5 mg Oral BID WC Gertha Calkin, MD   5 mg at 10/03/23 0816   pantoprazole (PROTONIX) EC tablet 40 mg  40 mg Oral Daily PRN Gertha Calkin, MD       rosuvastatin (CRESTOR) tablet 20 mg  20 mg Oral QPM Gertha Calkin, MD   20 mg at 10/02/23 1815   sodium chloride flush (NS) 0.9 % injection 3 mL  3 mL Intravenous Q12H Irena Cords V, MD   3 mL at 10/03/23 0933   sodium chloride flush (NS) 0.9 % injection 3-10 mL  3-10 mL Intravenous PRN Toni Amend W, DO       thiamine (VITAMIN B1) 500 mg in sodium chloride 0.9 % 50 mL IVPB  500 mg Intravenous Q24H Gertha Calkin, MD   Stopped at 10/02/23 2311     Discharge Medications: Please see discharge summary for a list of discharge medications.  Relevant Imaging Results:  Relevant Lab Results:   Additional Information ZOX:[id number]  Annakate Soulier A [place], LCSW

## 2023-10-03 NOTE — Plan of Care (Signed)

## 2023-10-03 NOTE — TOC Initial Note (Signed)
 Transition of Care [facility]) - Initial/Assessment Note    Patient Details  Name: Adriana Jordan MRN: [id number] Date of Birth: 19-Feb-1935  Transition of Care [facility]) CM/SW Contact:    Levell Tavano A [place], LCSW Phone Number: 10/03/2023, 2:37 PM  Clinical Narrative:                  CSW spoke with pt at bedside, she stated that she was agreeable to short term rehab at [facility].  CSW explained SNF process, no additional questions at this time. Medicare.gov ratings to be provided once with pt's bed offers. Bed offers currently pending. No preference, faxed out to [place], [place] area at pt's request.    TOC will continue to follow.   Expected Discharge Plan: Skilled Nursing Facility Barriers to Discharge: SNF Pending bed offer, Continued Medical Work up, English as a second language teacher   Patient Goals and CMS Choice            Expected Discharge Plan and Services                                              Prior Living Arrangements/Services   Lives with:: Adult Children          Need for Family Participation in Patient Care: Yes (Comment) Care giver support system in place?: Yes (comment) (Pt's son)      Activities of Daily Living      Permission Sought/Granted                  Emotional Assessment Appearance:: Appears stated age Attitude/Demeanor/Rapport: Lethargic Affect (typically observed): Appropriate Orientation: : Oriented to Self, Oriented to Place, Oriented to  Time, Oriented to Situation Alcohol / Substance Use: Not Applicable Psych Involvement: No (comment)  Admission diagnosis:  Peripheral edema [R60.0] Hypoxia [R09.02] Generalized weakness [R53.1] Hypotension, unspecified hypotension type [I95.9] Patient Active Problem List   Diagnosis Date Noted   Pneumonia 09/27/2023   CAD S/P percutaneous coronary angioplasty 09/12/2023   Hypotension 09/12/2023   Near syncope 09/12/2023   Syncope 07/30/2023   Headache 06/21/2022   Adrenal  insufficiency (HCC) 03/05/2022   Protein-calorie malnutrition, severe 03/03/2022   Hypokalemia 03/01/2022   Generalized weakness 03/01/2022   Protein-calorie malnutrition, moderate (HCC) 03/01/2022   Multiple falls 03/01/2022   Tick bite 03/01/2022   Hyponatremia 02/21/2022   History of CVA (cerebrovascular accident) 10/11/2019   Leukopenia 11/15/2017   Hypertension 11/14/2017   Hyperlipidemia 11/14/2017   GERD (gastroesophageal reflux disease) 11/14/2017   Pituitary adenoma (HCC) 04/07/2015   PCP:  Orpah Cobb, MD Pharmacy:   [facility] - [place], [place] - [address] [place] [address] Phone: [phone] Fax: [phone]  [facility] [address] [place] [address] Phone: [phone] Fax: [phone]     Social Drivers of Health (SDOH) Social History: SDOH Screenings   Food Insecurity: No Food Insecurity (10/02/2023)  Housing: Low Risk  (10/02/2023)  Transportation Needs: No Transportation Needs (10/02/2023)  Utilities: Not At Risk (10/02/2023)  Social Connections: Moderately Integrated (10/02/2023)  Tobacco Use: Medium Risk (10/01/2023)   SDOH Interventions:     Readmission Risk Interventions    02/15/2023    1:22 PM  Readmission Risk Prevention Plan  Post Dischage Appt Complete  Medication Screening Complete  Transportation Screening Complete

## 2023-10-04 DIAGNOSIS — R531 Weakness: Secondary | ICD-10-CM | POA: Diagnosis not present

## 2023-10-04 LAB — BASIC METABOLIC PANEL
Anion gap: 6 (ref 5–15)
BUN: 16 mg/dL (ref 8–23)
CO2: 21 mmol/L — ABNORMAL LOW (ref 22–32)
Calcium: 7.7 mg/dL — ABNORMAL LOW (ref 8.9–10.3)
Chloride: 109 mmol/L (ref 98–111)
Creatinine, Ser: 1.2 mg/dL — ABNORMAL HIGH (ref 0.44–1.00)
GFR, Estimated: 44 mL/min — ABNORMAL LOW (ref >60)
Glucose, Bld: 81 mg/dL (ref 70–99)
Potassium: 3.3 mmol/L — ABNORMAL LOW (ref 3.5–5.1)
Sodium: 136 mmol/L (ref 135–145)

## 2023-10-04 LAB — CBC
HCT: 30 % — ABNORMAL LOW (ref 36.0–46.0)
Hemoglobin: 10.2 g/dL — ABNORMAL LOW (ref 12.0–15.0)
MCH: 30.8 pg (ref 26.0–34.0)
MCHC: 34 g/dL (ref 30.0–36.0)
MCV: 90.6 fL (ref 80.0–100.0)
Platelets: 273 10*3/uL (ref 150–400)
RBC: 3.31 MIL/uL — ABNORMAL LOW (ref 3.87–5.11)
RDW: 12.9 % (ref 11.5–15.5)
WBC: 5.6 10*3/uL (ref 4.0–10.5)
nRBC: 0 % (ref 0.0–0.2)

## 2023-10-04 LAB — MAGNESIUM: Magnesium: 1.7 mg/dL (ref 1.7–2.4)

## 2023-10-04 NOTE — Progress Notes (Signed)
  Progress Note   Patient: Adriana Jordan [id number]:[id number] DOB: 05/02/1935 DOA: 10/01/2023     0 DOS: the patient was seen and examined on 10/04/2023   Brief hospital course:  Adriana Jordan is a 89 y.o. female with past medical history  of  HTN, HLD, CAD, CVA, syncope, GERD, pituitary adenoma s/p surgical intervention and radiation, adrenal insufficiency on hydrocortisone, here for SOB and feeling weak.   Assessment and Plan:  Physical debilitation muscle weakness - Exacerbated by acute illness, physical debilitation, osteoarthritis pain.  Patient stated that when she got home she was increasingly weak and could not perform her ADLs as expected.  Was having increased pain in her knees and shoulders.  Recently discharged, previous evaluation recommending home health PT.  Pending physical therapy evaluation.  Discuss with patient, case management on disposition planning.  Community-acquired pneumonia (POA) - Continue p.o. Augmentin.  Was hospitalized 2/18-2/21 for pneumonia, sent home with Augmentin.  Does not appear to be having any worsening fever, hypoxia, cough.  Reported dyspnea - No ongoing hypoxia.  Nasal cannula discontinued.  Acute kidney injury - On presentation BUN and creatinine mildly above baseline.  IV fluid hydration on board.  Showing improvement after IV fluid hydration.  Adrenal insufficiency - Resume patient's hydrocortisone.  No need for pulse dosing given no acute illness this hospitalization.  Osteoarthritis - Patient with noted right knee pain and surgical history of bilateral shoulders.  Contributes to patient's debilitation and pain.  Will attempt topical NSAIDs.  Hold p.o. given AKI.  lidocaine patch for right knee.  Goals of care - Given patient was just discharged 2/21 and was too weak to be at home, likely will be reevaluated and assessed for short-term rehab.  Work closely with case management on disposition planning.  Dissipate discharge tomorrow.      Subjective: Patient resting comfortably this morning.  Still complaining of intermittent pain in her right knee and decreased range of motion.  No worsening shortness of breath, fever, chills, chest pain, nausea, vomiting, abdominal pain.   Physical Exam: Vitals:   10/03/23 2047 10/04/23 0443 10/04/23 0446 10/04/23 0834  BP: 119/63 115/66  130/67  Pulse: 69 67  64  Resp: 18 18  20   Temp: 97.6 F (36.4 C) 97.6 F (36.4 C)  98.4 F (36.9 C)  TempSrc: Oral Oral  Axillary  SpO2: 94% 96%  97%  Weight:   76.3 kg   Height:       GENERAL:  Alert, pleasant, no acute distress, frail HEENT:  EOMI CARDIOVASCULAR:  RRR, no murmurs appreciated RESPIRATORY: Cough, clear to auscultation, no wheezing, rales, or rhonchi GASTROINTESTINAL:  Soft, nontender, nondistended EXTREMITIES: Thin, no LE edema bilaterally NEURO:  No new focal deficits appreciated SKIN: Dry, no rashes noted PSYCH:  Appropriate mood and affect, anxious  Data Reviewed:  There are no new results to review at this time.  Family Communication: None at bedside  Disposition: Status is: Observation The patient remains OBS appropriate and will d/c before 2 midnights.  Planned Discharge Destination: Skilled nursing facility    Time spent: 25 minutes  Author: Deanna Artis, DO 10/04/2023 1:34 PM  For on call review [email or web address].

## 2023-10-04 NOTE — Plan of Care (Signed)
  Problem: Education: Goal: Knowledge of risk factors and measures for prevention of condition will improve Outcome: Progressing   Problem: Respiratory: Goal: Will maintain a patent airway Outcome: Progressing   Problem: Education: Goal: Knowledge of General Education information will improve Description: Including pain rating scale, medication(s)/side effects and non-pharmacologic comfort measures Outcome: Progressing   Problem: Health Behavior/Discharge Planning: Goal: Ability to manage health-related needs will improve Outcome: Progressing   Problem: Clinical Measurements: Goal: Diagnostic test results will improve Outcome: Progressing

## 2023-10-04 NOTE — TOC Progression Note (Addendum)
 Transition of Care [facility]) - Progression Note    Patient Details  Name: Adriana Jordan MRN: [id number] Date of Birth: [birth year]  Transition of Care [facility]) CM/SW Contact  Aniello Christopoulos A [place], LCSW Phone Number: 10/04/2023, 12:01 PM  Clinical Narrative:     Update 1709: CSW started insurance authorization request for [employer].  Auth ID: [id number]  Possible DC tomorrow pending insurance and bed availability.  TOC will continue to follow.   CSW met with pt at bedside to provide bed offers, she stated that she was reaching out to family to get the name of facility who family member works at. CSW provided contact information to be reached out to once information is acquired. CSW to start insurance authorization once bed is confirmed as pt is near medical stability.    TOC will continue to follow.   Expected Discharge Plan: Skilled Nursing Facility Barriers to Discharge: SNF Pending bed offer, Continued Medical Work up, English as a second language teacher  Expected Discharge Plan and Services                                               Social Determinants of Health (SDOH) Interventions SDOH Screenings   Food Insecurity: No Food Insecurity (10/02/2023)  Housing: Low Risk  (10/02/2023)  Transportation Needs: No Transportation Needs (10/02/2023)  Utilities: Not At Risk (10/02/2023)  Social Connections: Moderately Integrated (10/02/2023)  Tobacco Use: Medium Risk (10/01/2023)    Readmission Risk Interventions    02/15/2023    1:22 PM  Readmission Risk Prevention Plan  Post Dischage Appt Complete  Medication Screening Complete  Transportation Screening Complete

## 2023-10-04 NOTE — Progress Notes (Signed)
 Mobility Specialist: Progress Note   10/04/23 1202  Mobility  Activity Ambulated with assistance in room  Level of Assistance Contact guard assist, steadying assist  Assistive Device Front wheel walker  Distance Ambulated (ft) 25 ft  Activity Response Tolerated well  Mobility Referral Yes  Mobility visit 1 Mobility  Mobility Specialist Start Time (ACUTE ONLY) 1029  Mobility Specialist Stop Time (ACUTE ONLY) 1045  Mobility Specialist Time Calculation (min) (ACUTE ONLY) 16 min    Pt was agreeable to mobility session - received in bed. ModA for bed mobility to assist with scooting EOB and moving RLE. MinA for STS, CG for ambulation. C/o R knee pain and fatigue. Left in bed with all needs met, call bell in reach. Bed alarm on.   Maurene Capes Mobility Specialist Please contact via SecureChat or Rehab office at [phone]

## 2023-10-04 NOTE — Plan of Care (Signed)
  Problem: Respiratory: Goal: Will maintain a patent airway Outcome: Progressing   Problem: Clinical Measurements: Goal: Ability to maintain clinical measurements within normal limits will improve Outcome: Progressing Goal: Respiratory complications will improve Outcome: Progressing   Problem: Activity: Goal: Risk for activity intolerance will decrease Outcome: Progressing   Problem: Pain Managment: Goal: General experience of comfort will improve and/or be controlled Outcome: Progressing   Problem: Safety: Goal: Ability to remain free from injury will improve Outcome: Progressing

## 2023-10-05 ENCOUNTER — Observation Stay (HOSPITAL_COMMUNITY): Payer: Medicare PPO

## 2023-10-05 DIAGNOSIS — R531 Weakness: Secondary | ICD-10-CM | POA: Diagnosis not present

## 2023-10-05 MED ORDER — POTASSIUM CHLORIDE 20 MEQ PO PACK
40.0000 meq | PACK | Freq: Once | ORAL | Status: AC
  Administered 2023-10-05: 40 meq via ORAL
  Filled 2023-10-05: qty 2

## 2023-10-05 NOTE — Progress Notes (Signed)
 PROGRESS NOTE    Adriana Jordan  NFA:[id number] DOB: Jul 21, 1935 DOA: 10/01/2023 PCP: Orpah Cobb, MD    Brief Narrative:  This 89 yrs old female with PMH significant for HTN, HLD, CAD, CVA, syncope, GERD, pituitary adenoma s/p surgical intervention and radiation, adrenal insufficiency on hydrocortisone, presented in the ED for SOB and Generalized weakness.  Patient was admitted for pneumonia on 2/18 and was discharged on Augmentin and hydrocortisone for adrenal insufficiency on 09/30/23.She presented back with worsening generalized weakness.   Assessment & Plan:   Principal Problem:   Generalized weakness Active Problems:   Hypertension   History of CVA (cerebrovascular accident)   Adrenal insufficiency (HCC)   CAD S/P percutaneous coronary angioplasty   Generalized weakness and deconditioning: Exacerbated by acute illness, physical debilitation, osteoarthritis pain.   Patient reports after she was discharged home,  She was increasingly weak,  could not perform her ADLs as expected.She also reports increased pain in her knees and shoulders.  She was discharged home with home PT.  PT reevaluation pending.   Community-acquired pneumonia: She was hospitalized 2/18-2/21 for pneumonia, sent home with Augmentin.   Does not appear to be having any worsening fever, hypoxia, cough. Continue Augmentin to complete course.   Acute kidney injury: Likely secondary to dehydration.  Continue IV hydration. Avoid nephrotoxic medications, serum creatinine improving.   Adrenal insufficiency: Continue home hydrocortisone.  Osteoarthritis: Patient reports right knee pain and surgical history of bilateral shoulders.   Continue topical NSAIDs.  Hold p.o. given AKI.  lidocaine patch for right knee.   Goals of care: PT and OT evaluation for possible short-term rehab.   DVT prophylaxis: Heparin Code Status: Full code Family Communication: No family at bedside Disposition Plan:   Status  is: Observation The patient remains OBS appropriate and will d/c before 2 midnights.   Needs PT and OT evaluation for disposition.  Readmitted with worsening generalized weakness and deconditioning.  Consultants:  None  Procedures: None  Antimicrobials:  Anti-infectives (From admission, onward)    Start     Dose/Rate Route Frequency Ordered Stop   10/01/23 2200  amoxicillin-clavulanate (AUGMENTIN) 500-125 MG per tablet 1 tablet        1 tablet Oral 2 times daily 10/01/23 1909 10/05/23 0941       Subjective: She was seen and examined at bedside.  Overnight events noted.  Patient reports feeling better,  She has participated with physical therapy.  She reports feeling weak and tired.  Objective: Vitals:   10/05/23 0352 10/05/23 0445 10/05/23 0515 10/05/23 0750  BP: 125/69 126/66  134/64  Pulse: 84 66  66  Resp: 18 18  18   Temp: (!) 97.4 F (36.3 C) 97.9 F (36.6 C)  98 F (36.7 C)  TempSrc: Oral Oral    SpO2: 95% 93%  93%  Weight:   75 kg   Height:        Intake/Output Summary (Last 24 hours) at 10/05/2023 1123 Last data filed at 10/05/2023 4696 Gross per 24 hour  Intake 85 ml  Output 927 ml  Net -842 ml   Filed Weights   10/03/23 0452 10/04/23 0446 10/05/23 0515  Weight: 76.9 kg 76.3 kg 75 kg    Examination:  General exam: Appears calm and comfortable, deconditioned, not in any acute distress. Respiratory system: Clear to auscultation. Respiratory effort normal.  RR 15 Cardiovascular system: S1 & S2 heard, RRR. No JVD, murmurs, rubs, gallops or clicks.  Gastrointestinal system: Abdomen is non distended, soft and  non tender. Normal bowel sounds heard. Central nervous system: Alert and oriented X 3. No focal neurological deficits. Extremities: No edema, no cyanosis, no clubbing. Skin: No rashes, lesions or ulcers Psychiatry: Judgement and insight appear normal. Mood & affect appropriate.     Data Reviewed: I have personally reviewed following labs and imaging  studies  CBC: Recent Labs  Lab 09/29/23 0532 09/30/23 0629 10/01/23 1355 10/01/23 1905 10/02/23 0425 10/03/23 0750 10/04/23 0600  WBC 3.9* 3.6* 7.8  --  7.5 6.7 5.6  NEUTROABS 2.2 1.6*  --   --   --   --   --   HGB 10.5* 11.7* 12.1 11.9* 11.2* 10.5* 10.2*  HCT 31.1* 34.6* 35.5* 35.0* 33.2* 31.3* 30.0*  MCV 92.6 92.0 93.2  --  92.2 91.5 90.6  PLT 240 281 264  --  244 256 273   Basic Metabolic Panel: Recent Labs  Lab 09/30/23 0629 10/01/23 1355 10/01/23 1905 10/02/23 0425 10/03/23 0750 10/04/23 0600  NA 141 139 140 139 137 136  K 3.7 3.6 3.7 3.6 3.4* 3.3*  CL 108 105  --  105 106 109  CO2 24 22  --  21* 22 21*  GLUCOSE 102* 107*  --  97 92 81  BUN 10 16  --  17 17 16   CREATININE 1.48* 1.78*  --  1.56* 1.42* 1.20*  CALCIUM 9.0 8.6*  --  8.5* 8.2* 7.7*  MG  --   --   --   --  1.6* 1.7   GFR: Estimated Creatinine Clearance: 34.9 mL/min (A) (by C-G formula based on SCr of 1.2 mg/dL (H)). Liver Function Tests: Recent Labs  Lab 10/01/23 1355 10/02/23 0425  AST 32 27  ALT 15 14  ALKPHOS 34* 36*  BILITOT 0.7 0.8  PROT 5.6* 5.1*  ALBUMIN 3.0* 2.5*   Recent Labs  Lab 10/01/23 1450  LIPASE 30   No results for input(s): "AMMONIA" in the last 168 hours. Coagulation Profile: No results for input(s): "INR", "PROTIME" in the last 168 hours. Cardiac Enzymes: Recent Labs  Lab 10/01/23 1856  CKTOTAL 161   BNP (last 3 results) No results for input(s): "PROBNP" in the last 8760 hours. HbA1C: No results for input(s): "HGBA1C" in the last 72 hours. CBG: Recent Labs  Lab 09/29/23 1156 09/29/23 1613 09/29/23 1949 09/30/23 0459 09/30/23 0914  GLUCAP 168* 129* 153* 104* 113*   Lipid Profile: No results for input(s): "CHOL", "HDL", "LDLCALC", "TRIG", "CHOLHDL", "LDLDIRECT" in the last 72 hours. Thyroid Function Tests: No results for input(s): "TSH", "T4TOTAL", "FREET4", "T3FREE", "THYROIDAB" in the last 72 hours. Anemia Panel: No results for input(s):  "VITAMINB12", "FOLATE", "FERRITIN", "TIBC", "IRON", "RETICCTPCT" in the last 72 hours. Sepsis Labs: Recent Labs  Lab 10/01/23 1433  LATICACIDVEN 1.7    Recent Results (from the past 240 hours)  Resp panel by RT-PCR (RSV, Flu A&B, Covid) Anterior Nasal Swab     Status: Abnormal   Collection Time: 09/27/23 10:42 AM   Specimen: Anterior Nasal Swab  Result Value Ref Range Status   SARS Coronavirus 2 by RT PCR POSITIVE (A) NEGATIVE Final   Influenza A by PCR NEGATIVE NEGATIVE Final   Influenza B by PCR NEGATIVE NEGATIVE Final    Comment: (NOTE) The Xpert Xpress SARS-CoV-2/FLU/RSV plus assay is intended as an aid in the diagnosis of influenza from Nasopharyngeal swab specimens and should not be used as a sole basis for treatment. Nasal washings and aspirates are unacceptable for Xpert Xpress SARS-CoV-2/FLU/RSV testing.  Fact Sheet for Patients: BloggerCourse.com  Fact Sheet for Healthcare Providers: SeriousBroker.it  This test is not yet approved or cleared by the [place] FDA and has been authorized for detection and/or diagnosis of SARS-CoV-2 by FDA under an Emergency Use Authorization (EUA). This EUA will remain in effect (meaning this test can be used) for the duration of the COVID-19 declaration under Section 564(b)(1) of the Act, 21 U.S.C. section 360bbb-3(b)(1), unless the authorization is terminated or revoked.     Resp Syncytial Virus by PCR NEGATIVE NEGATIVE Final    Comment: (NOTE) Fact Sheet for Patients: BloggerCourse.com  Fact Sheet for Healthcare Providers: SeriousBroker.it  This test is not yet approved or cleared by the [place] FDA and has been authorized for detection and/or diagnosis of SARS-CoV-2 by FDA under an Emergency Use Authorization (EUA). This EUA will remain in effect (meaning this test can be used) for the duration of the COVID-19  declaration under Section 564(b)(1) of the Act, 21 U.S.C. section 360bbb-3(b)(1), unless the authorization is terminated or revoked.  Performed at [facility], [address], [place], [place] [address]   Resp panel by RT-PCR (RSV, Flu A&B, Covid) Anterior Nasal Swab     Status: None   Collection Time: 10/01/23  2:10 PM   Specimen: Anterior Nasal Swab  Result Value Ref Range Status   SARS Coronavirus 2 by RT PCR NEGATIVE NEGATIVE Final   Influenza A by PCR NEGATIVE NEGATIVE Final   Influenza B by PCR NEGATIVE NEGATIVE Final    Comment: (NOTE) The Xpert Xpress SARS-CoV-2/FLU/RSV plus assay is intended as an aid in the diagnosis of influenza from Nasopharyngeal swab specimens and should not be used as a sole basis for treatment. Nasal washings and aspirates are unacceptable for Xpert Xpress SARS-CoV-2/FLU/RSV testing.  Fact Sheet for Patients: BloggerCourse.com  Fact Sheet for Healthcare Providers: SeriousBroker.it  This test is not yet approved or cleared by the [place] FDA and has been authorized for detection and/or diagnosis of SARS-CoV-2 by FDA under an Emergency Use Authorization (EUA). This EUA will remain in effect (meaning this test can be used) for the duration of the COVID-19 declaration under Section 564(b)(1) of the Act, 21 U.S.C. section 360bbb-3(b)(1), unless the authorization is terminated or revoked.     Resp Syncytial Virus by PCR NEGATIVE NEGATIVE Final    Comment: (NOTE) Fact Sheet for Patients: BloggerCourse.com  Fact Sheet for Healthcare Providers: SeriousBroker.it  This test is not yet approved or cleared by the [place] FDA and has been authorized for detection and/or diagnosis of SARS-CoV-2 by FDA under an Emergency Use Authorization (EUA). This EUA will remain in effect (meaning this test can be used) for the duration of  the COVID-19 declaration under Section 564(b)(1) of the Act, 21 U.S.C. section 360bbb-3(b)(1), unless the authorization is terminated or revoked.  Performed at [facility], [address] [place], [place] [address]    Radiology Studies: No results found.  Scheduled Meds:  aspirin EC  81 mg Oral Daily   diclofenac Sodium  2 g Topical QID   heparin injection (subcutaneous)  5,000 Units Subcutaneous Q8H   hydrocortisone  10 mg Oral QPM   hydrocortisone  20 mg Oral [id number]   lidocaine  1 patch Transdermal Q24H   midodrine  5 mg Oral BID WC   rosuvastatin  20 mg Oral QPM   sodium chloride flush  3 mL Intravenous Q12H   Continuous Infusions:   LOS: 0 days  Time spent: 50 mins.    Willeen Niece, MD [facility]   If 7PM-7AM, please contact night-coverage

## 2023-10-05 NOTE — Plan of Care (Signed)
  Problem: Education: Goal: Knowledge of risk factors and measures for prevention of condition will improve Outcome: Progressing   Problem: Respiratory: Goal: Will maintain a patent airway Outcome: Progressing Goal: Complications related to the disease process, condition or treatment will be avoided or minimized Outcome: Progressing   Problem: Education: Goal: Knowledge of General Education information will improve Description: Including pain rating scale, medication(s)/side effects and non-pharmacologic comfort measures Outcome: Progressing   Problem: Health Behavior/Discharge Planning: Goal: Ability to manage health-related needs will improve Outcome: Progressing   Problem: Activity: Goal: Risk for activity intolerance will decrease Outcome: Progressing   Problem: Nutrition: Goal: Adequate nutrition will be maintained Outcome: Progressing

## 2023-10-05 NOTE — TOC Progression Note (Signed)
 Transition of Care [facility]) - Progression Note    Patient Details  Name: Adriana Jordan MRN: [id number] Date of Birth: 1934/12/10  Transition [facility]) CM/SW Contact  Melenda Bielak A [place], LCSW Phone Number: 10/05/2023, 3:41 PM  Clinical Narrative:     Pt's authorization approved for [facility]. CSW notified facility of pt's discharge scheduled for tomorrow.   Auth ID [id number] Reference ID [id number]  Approval Dates: 10/05/2023-10/07/2023   TOC will continue to follow.   Expected Discharge Plan: Skilled Nursing Facility Barriers to Discharge: SNF Pending bed offer, Continued Medical Work up, English as a second language teacher  Expected Discharge Plan and Services                                               Social Determinants of Health (SDOH) Interventions SDOH Screenings   Food Insecurity: No Food Insecurity (10/02/2023)  Housing: Low Risk  (10/02/2023)  Transportation Needs: No Transportation Needs (10/02/2023)  Utilities: Not At Risk (10/02/2023)  Social Connections: Moderately Integrated (10/02/2023)  Tobacco Use: Medium Risk (10/01/2023)    Readmission Risk Interventions    02/15/2023    1:22 PM  Readmission Risk Prevention Plan  Post Dischage Appt Complete  Medication Screening Complete  Transportation Screening Complete

## 2023-10-05 NOTE — Progress Notes (Signed)
 Physical Therapy Treatment Patient Details Name: Adriana Jordan MRN: [id number] DOB: 23-Jul-1935 Today's Date: 10/05/2023   History of Present Illness Patient is a 89 year old female admitted with hypotension and generalized weakness on 10/01/23 after recent discharge from hospital with respiratory failure and pneumonia. History of  HTN, HLD, CAD, CVA, syncope, GERD, pituitary adenoma, adrenal insufficiency    PT Comments  Pt is questionable historian but does reports new R hip pain (just prior to admission) , chronic R knee pain, and thinks she fell off bed at home.  She does have edema throughout R LE.  Pain in R hip with AAROM and PROM.  Deferred OOB activity and notified MD.  Pt was mod/max A for bed mobility.  Cont POC. Recommend Patient will benefit from continued inpatient follow up therapy, <3 hours/day at d/c.     If plan is discharge home, recommend the following: A lot of help with walking and/or transfers;A lot of help with bathing/dressing/bathroom;Help with stairs or ramp for entrance;Assist for transportation;Assistance with cooking/housework   Can travel by private vehicle     No  Equipment Recommendations  None recommended by PT    Recommendations for Other Services       Precautions / Restrictions Precautions Precautions: Fall     Mobility  Bed Mobility Overal bed mobility: Needs Assistance Bed Mobility: Supine to Sit, Sit to Supine     Supine to sit: Mod assist Sit to supine: Max assist   General bed mobility comments: Pt requiring assist for R LE off bed , to lift trunk, and scoot forward.  Pt did pull with R UE to assist in lifting trunk.  For return to bed max A for both legs and then to reposition    Transfers Overall transfer level: Needs assistance Equipment used: 1 person hand held assist Transfers: Bed to chair/wheelchair/BSC            Lateral/Scoot Transfers: Mod assist General transfer comment: Held attempts to stand due to pt reports  pain in R hip is new, reports fall at home, and does have edema throughout R LE.  Performed lateral scoot toward HOB by using UE and pushing into L LE.    Ambulation/Gait                   Stairs             Wheelchair Mobility     Tilt Bed    Modified Rankin (Stroke Patients Only)       Balance Overall balance assessment: Needs assistance Sitting-balance support: Feet supported Sitting balance-Leahy Scale: Fair                                      Communication    Cognition Arousal: Alert Behavior During Therapy: WFL for tasks assessed/performed   PT - Cognitive impairments: Memory, No family/caregiver present to determine baseline                       PT - Cognition Comments: patient has some confusion about recent events leading up to this hospital stay - states "I think I fell off bed"        Cueing    Exercises General Exercises - Lower Extremity Ankle Circles/Pumps: AROM, Both, 10 reps, Seated Long Arc Quad: AROM, Left, 10 reps, Seated, Limitations Long Arc Quad Limitations: Attempted R LAQ with  AAROM but pt reports very painful in hip , even with PROM    General Comments General comments (skin integrity, edema, etc.): Pt with c/o severe R hip and knee pain.  Reports R knee pain for years but states R hip pain started just prior to admission. States she thinks she fell off bed.  Did note edema throughout R LE from hip to ankle.  No pain in calf.  Negative Homans.  Held OOB activity.  Notified MD and RN of reports of newer hip pain.      Pertinent Vitals/Pain Pain Assessment Pain Assessment: 0-10 Pain Score: 10-Worst pain ever Faces Pain Scale: Hurts even more Pain Location: R hip and R knee Pain Descriptors / Indicators: Sharp Pain Intervention(s): Limited activity within patient's tolerance, Monitored during session, Repositioned    Home Living                          Prior Function            PT  Goals (current goals can now be found in the care plan section) Progress towards PT goals: Progressing toward goals    Frequency    Min 1X/week      PT Plan      Co-evaluation              AM-PAC PT "6 Clicks" Mobility   Outcome Measure  Help needed turning from your back to your side while in a flat bed without using bedrails?: A Lot Help needed moving from lying on your back to sitting on the side of a flat bed without using bedrails?: A Lot Help needed moving to and from a bed to a chair (including a wheelchair)?: Total Help needed standing up from a chair using your arms (e.g., wheelchair or bedside chair)?: Total Help needed to walk in hospital room?: Total Help needed climbing 3-5 steps with a railing? : Total 6 Click Score: 8    End of Session   Activity Tolerance: Patient limited by fatigue;Patient limited by pain Patient left: in bed;with call bell/phone within reach;with bed alarm set Nurse Communication: Mobility status PT Visit Diagnosis: Unsteadiness on feet (R26.81);Muscle weakness (generalized) (M62.81) Pain - Right/Left: Right Pain - part of body: Hip;Knee     Time: 5409-8119 PT Time Calculation (min) (ACUTE ONLY): 21 min  Charges:    $Therapeutic Activity: 8-22 mins PT General Charges $$ ACUTE PT VISIT: 1 Visit                     Anise Salvo, PT Acute Rehab [facility] Re[facility] [phone]    Rayetta Humphrey 10/05/2023, 12:24 PM

## 2023-10-05 NOTE — Progress Notes (Signed)
 Mobility Specialist Progress Note:    10/05/23 1200  Mobility  Activity Ambulated with assistance in room  Level of Assistance Minimal assist, patient does 75% or more  Assistive Device Front wheel walker  Distance Ambulated (ft) 20 ft  Activity Response Tolerated well  Mobility Referral Yes  Mobility visit 1 Mobility  Mobility Specialist Start Time (ACUTE ONLY) 0900  Mobility Specialist Stop Time (ACUTE ONLY) 0915  Mobility Specialist Time Calculation (min) (ACUTE ONLY) 15 min   Received pt in bed having no complaints and agreeable to mobility. Stayed in room d/t mild pain in L hip, otherwise no c/o. Left in bed w/ call bell in reach and all needs met.   Thompson Grayer Mobility Specialist  Please contact vis Secure Chat or  Rehab Office [phone]

## 2023-10-06 ENCOUNTER — Inpatient Hospital Stay: Payer: Medicare PPO | Admitting: Internal Medicine

## 2023-10-06 DIAGNOSIS — R531 Weakness: Secondary | ICD-10-CM | POA: Diagnosis not present

## 2023-10-06 LAB — BASIC METABOLIC PANEL
Anion gap: 13 (ref 5–15)
BUN: 11 mg/dL (ref 8–23)
CO2: 24 mmol/L (ref 22–32)
Calcium: 8.9 mg/dL (ref 8.9–10.3)
Chloride: 103 mmol/L (ref 98–111)
Creatinine, Ser: 1.35 mg/dL — ABNORMAL HIGH (ref 0.44–1.00)
GFR, Estimated: 38 mL/min — ABNORMAL LOW (ref >60)
Glucose, Bld: 88 mg/dL (ref 70–99)
Potassium: 3.4 mmol/L — ABNORMAL LOW (ref 3.5–5.1)
Sodium: 140 mmol/L (ref 135–145)

## 2023-10-06 LAB — PHOSPHORUS: Phosphorus: 3.1 mg/dL (ref 2.5–4.6)

## 2023-10-06 LAB — MAGNESIUM: Magnesium: 1.9 mg/dL (ref 1.7–2.4)

## 2023-10-06 MED ORDER — MIDODRINE HCL 5 MG PO TABS: 5.0000 mg | ORAL_TABLET | Freq: Two times a day (BID) | ORAL | 0 refills | Status: AC

## 2023-10-06 NOTE — Plan of Care (Signed)
   Problem: Activity: Goal: Risk for activity intolerance will decrease Outcome: Progressing   Problem: Pain Managment: Goal: General experience of comfort will improve and/or be controlled Outcome: Progressing   Problem: Safety: Goal: Ability to remain free from injury will improve Outcome: Progressing

## 2023-10-06 NOTE — Discharge Summary (Signed)
 Physician Discharge Summary  Adriana Jordan VHQ:[id number] DOB: 02/10/1935 DOA: 10/01/2023  PCP: Orpah Cobb, MD  Admit date: 10/01/2023  Discharge date: 10/06/2023  Admitted From: Home  Disposition:  SNF  Recommendations for Outpatient Follow-up:  Follow up with PCP in 1-2 weeks. Please obtain BMP/CBC in one week. Patient has completed antibiotics course for pneumonia.   Patient being discharged to skilled nursing facility for rehab.  Home Health: None. Equipment/Devices:None.  Discharge Condition: Stable CODE STATUS:Full code Diet recommendation: Heart Healthy   Brief [facility] Course: This 89 yrs old female with PMH significant for HTN, HLD, CAD, CVA, syncope, GERD, pituitary adenoma s/p surgical intervention and radiation, adrenal insufficiency on hydrocortisone, presented in the ED for SOB and Generalized weakness.  Patient was recently hospitalized for pneumonia on 09/27/23 and was discharged on Augmentin and hydrocortisone for adrenal insufficiency on 09/30/23. She presented back  in the ED with worsening generalized weakness.Patient has significant weakness / unable to ambulate easily at home, PT/OT recommended SNF, Patient feels better, insurance authorization approved, Patient is being discharged to skilled nursing facility. She has competed antibiotics course for PNA.    Discharge Diagnoses:  Principal Problem:   Generalized weakness Active Problems:   Hypertension   History of CVA (cerebrovascular accident)   Adrenal insufficiency (HCC)   CAD S/P percutaneous coronary angioplasty  Generalized weakness and deconditioning: Exacerbated by acute illness, physical debilitation, osteoarthritis pain.   Patient reports after she was discharged home,  She was increasingly weak,  could not perform her ADLs as expected. She also reports increased pain in her knees and shoulders.   She was discharged home with home PT.  PT reevaluation recommended SNF.    Community-acquired pneumonia: She was hospitalized 2/18-2/21 for pneumonia, sent home with Augmentin.   Does not appear to be having any worsening fever, hypoxia, cough. She completed a course of Augmentin for pneumonia.   Acute kidney injury: > Improved. Likely secondary to dehydration.  Continued IV hydration. Avoid nephrotoxic medications, serum creatinine back to normal.   Adrenal insufficiency: Continue home hydrocortisone.   Osteoarthritis: Patient reports right knee pain and surgical history of bilateral shoulders.   Continue topical NSAIDs.  Hold p.o. given AKI.  lidocaine patch for right knee.   Goals of care: PT and OT evaluation for possible short-term rehab.    Discharge Instructions  Discharge Instructions     Call MD for:  difficulty breathing, headache or visual disturbances   Complete by: As directed    Call MD for:  persistant dizziness or light-headedness   Complete by: As directed    Call MD for:  persistant nausea and vomiting   Complete by: As directed    Diet - low sodium heart healthy   Complete by: As directed    Diet Carb Modified   Complete by: As directed    Discharge instructions   Complete by: As directed    Advised to follow-up with primary care physician in 1 week. Patient has completed antibiotics course for pneumonia.   Patient being discharged to skilled nursing facility for rehab   Increase activity slowly   Complete by: As directed       Allergies as of 10/06/2023       Reactions   Codeine Nausea And Vomiting   Tolerates hydrocodone        Medication List     STOP taking these medications    amoxicillin-clavulanate 875-125 MG tablet Commonly known as: AUGMENTIN  TAKE these medications    aspirin EC 81 MG tablet Take 1 tablet (81 mg total) by mouth daily. Swallow whole.   feeding supplement Liqd Take 237 mLs by mouth 2 (two) times daily between meals.   hydrocortisone 20 MG tablet Commonly known as:  CORTEF Take 1 tablet (20 mg total) by mouth every morning.   hydrocortisone 10 MG tablet Commonly known as: CORTEF Take 1 tablet (10 mg total) by mouth every evening.   midodrine 5 MG tablet Commonly known as: PROAMATINE Take 1 tablet (5 mg total) by mouth 2 (two) times daily with a meal.   pantoprazole 40 MG tablet Commonly known as: PROTONIX Take 40 mg by mouth daily as needed (heartburn).   PreserVision AREDS 2 Caps Take 1 capsule by mouth 2 (two) times daily.   rosuvastatin 20 MG tablet Commonly known as: CRESTOR Take 20 mg by mouth every evening.   torsemide 10 MG tablet Commonly known as: DEMADEX Take 10 mg by mouth daily as needed (fluid, swelling).        Contact information for follow-up providers     Orpah Cobb, MD Follow up in 1 week(s).   Specialty: Cardiology Contact information: [address] [place] [address] [phone]         Koirala, Dibas, MD Follow up in 1 week(s).   Specialty: Family Medicine Contact information: [address] [place] [address] [phone]              Contact information for after-discharge care     Destination     [facility] [place] Preferred SNF .   Service: Skilled Nursing Contact information: [address] [place] [address] [phone]                    Allergies  Allergen Reactions   Codeine Nausea And Vomiting    Tolerates hydrocodone    Consultations: None   Procedures/Studies: DG HIP UNILAT WITH PELVIS 2-3 VIEWS RIGHT Result Date: 10/05/2023 CLINICAL DATA:  Right hip pain without known injury. EXAM: DG HIP (WITH OR WITHOUT PELVIS) 2-3V RIGHT COMPARISON:  None Available. FINDINGS: There is no evidence of hip fracture or dislocation. Minimal osteophyte formation of right hip is noted. IMPRESSION: Minimal degenerative change of right hip. No acute abnormality seen. Electronically Signed   By: Lupita Raider M.D.   On: 10/05/2023 17:00   CT HEAD WO CONTRAST ( ) Result Date: 10/01/2023 CLINICAL DATA:  Headache and hypotension. EXAM: CT HEAD WITHOUT CONTRAST TECHNIQUE: Contiguous axial images were obtained from the base of the skull through the vertex without intravenous contrast. RADIATION DOSE REDUCTION: This exam was performed according to the departmental dose-optimization program which includes automated exposure control, adjustment of the mA and/or kV according to patient size and/or use of iterative reconstruction technique. COMPARISON:  Head CT 09/27/2023 FINDINGS: Brain: No evidence of acute infarction, hemorrhage, hydrocephalus, extra-axial collection or mass lesion/mass effect. There is mild diffuse atrophy. There is stable moderate periventricular and deep white matter hypodensity, likely chronic small vessel ischemic change. Vascular: Atherosclerotic calcifications are present within the cavernous internal carotid arteries. Skull: Normal. Negative for fracture or focal lesion. Sinuses/Orbits: No acute finding. Other: None. IMPRESSION: 1. No acute intracranial process. 2. Stable atrophy and chronic small vessel ischemic changes. Electronically Signed   By: Darliss Cheney M.D.   On: 10/01/2023 20:29   DG Chest Portable 1 View Result Date: 10/01/2023 CLINICAL DATA:  Shortness of breath.  EXAM: PORTABLE CHEST 1 VIEW COMPARISON:  September 27, 2023. FINDINGS: The heart size and mediastinal contours are within normal limits. Right lung is clear. Minimal left basilar subsegmental atelectasis or infiltrate is noted. The visualized skeletal structures are unremarkable. IMPRESSION: Minimal left basilar subsegmental atelectasis or infiltrate. Electronically Signed   By: Lupita Raider M.D.   On: 10/01/2023 16:21   DG Abd 2 Views Result Date: 09/29/2023 CLINICAL DATA:  Abdominal pain. EXAM: ABDOMEN - 2 VIEW COMPARISON:  03/09/2021. Abdomen and pelvis CT dated 12/19/2021. Chest CT dated 09/27/2023 FINDINGS:  Normal bowel-gas pattern. Normal amount of stool. No free peritoneal air. Interval mildly enlarged cardiac silhouette with interval diffuse prominence of the interstitial markings as well as interval increased patchy densities in the lower lung zones bilaterally with possible small bilateral pleural effusions. Lumbar and lower thoracic spine degenerative changes. IMPRESSION: 1. No acute abnormality. 2. Interval mild cardiomegaly and changes of congestive heart failure with bibasilar atelectasis, pneumonia or alveolar edema. Electronically Signed   By: Beckie Salts M.D.   On: 09/29/2023 15:16   CT Chest Wo Contrast Result Date: 09/27/2023 CLINICAL DATA:  Respiratory failure trauma EXAM: CT CHEST WITHOUT CONTRAST TECHNIQUE: Multidetector CT imaging of the chest was performed following the standard protocol without IV contrast. RADIATION DOSE REDUCTION: This exam was performed according to the departmental dose-optimization program which includes automated exposure control, adjustment of the mA and/or kV according to patient size and/or use of iterative reconstruction technique. COMPARISON:  Chest x-ray 09/27/2023, CT chest 07/30/2023 FINDINGS: Cardiovascular: Limited evaluation without intravenous contrast. Moderate aortic atherosclerosis. No aneurysm. Coronary vascular calcification. Normal cardiac size. No significant pericardial effusion Mediastinum/Nodes: Patent trachea. No thyroid mass. No suspicious lymph nodes. Esophagus within normal limits Lungs/Pleura: No pleural effusion or pneumothorax. Mild lower lobe dependent partial consolidations. Upper Abdomen: No acute finding Musculoskeletal: No acute osseous abnormality IMPRESSION: 1. Mild lower lobe dependent partial consolidations, possible atelectasis, pneumonia or aspiration. 2. Aortic atherosclerosis. Aortic Atherosclerosis (ICD10-I70.0). Electronically Signed   By: Jasmine Pang M.D.   On: 09/27/2023 15:51   CT Head Wo Contrast Result Date:  09/27/2023 CLINICAL DATA:  Provided history: Delirium, possible trauma. Neck trauma. EXAM: CT HEAD WITHOUT CONTRAST CT CERVICAL SPINE WITHOUT CONTRAST TECHNIQUE: Multidetector CT imaging of the head and cervical spine was performed following the standard protocol without intravenous contrast. Multiplanar CT image reconstructions of the cervical spine were also generated. RADIATION DOSE REDUCTION: This exam was performed according to the departmental dose-optimization program which includes automated exposure control, adjustment of the mA and/or kV according to patient size and/or use of iterative reconstruction technique. COMPARISON:  Head CT 07/30/2023. Brain MRI 06/21/2022. Cervical spine radiographs 09/13/2008. FINDINGS: CT HEAD FINDINGS Brain: Generalized parenchymal atrophy. Chronic lacunar infarct again noted within the left retrolenticular white matter. Patchy and ill-defined hypoattenuation elsewhere within the cerebral white matter, nonspecific but compatible with moderate chronic small vessel ischemic disease. There is no acute intracranial hemorrhage. No demarcated cortical infarct. No extra-axial fluid collection. No evidence of an intracranial mass. No midline shift. Vascular: No hyperdense vessel. Atherosclerotic calcifications. Skull: No calvarial fracture or aggressive osseous lesion. Sinuses/Orbits: No mass or acute finding within the imaged orbits. Mild mucosal thickening within the bilateral maxillary sinuses. Complete opacification of the right sphenoid sinus (with associated chronic reactive osteitis). CT CERVICAL SPINE FINDINGS Alignment: 2 mm C7-T1 grade 1 anterolisthesis. Skull base and vertebrae: The basion-dental and atlanto-dental intervals are maintained.No evidence of acute fracture to the cervical spine. Prior C3-C6 ACDF. No evidence of  acute hardware compromise. Soft tissues and spinal canal: No prevertebral fluid or swelling. No visible canal hematoma. Disc levels: Prior C3-C6 ACDF.  Disc degeneration at the non-operative levels, greatest at C6-C7 (advanced at this level). Small C2-C3 central disc protrusion. Posterior disc osteophyte complex at C6-C7. Multilevel uncovertebral hypertrophy. No appreciable high-grade spinal canal stenosis. Multilevel bony neural foraminal narrowing. Degenerative changes also present at the C1-C2 articulation. Upper chest: Minimal atelectasis within the dependent aspect of the left upper lobe at the imaged levels. No visible pneumothorax. IMPRESSION: CT head: 1.  No evidence of an acute intracranial abnormality. 2. Parenchymal atrophy and chronic small vessel ischemic disease, as described. 3. Paranasal sinus disease (including severe chronic right sphenoid sinusitis). CT cervical spine: 1. No evidence of an acute cervical spine fracture. 2. 2 mm C7-T1 grade 1 anterolisthesis. 3. Cervical spondylosis and post-operative changes, as described. Electronically Signed   By: Jackey Loge D.O.   On: 09/27/2023 14:41   CT Cervical Spine Wo Contrast Result Date: 09/27/2023 CLINICAL DATA:  Provided history: Delirium, possible trauma. Neck trauma. EXAM: CT HEAD WITHOUT CONTRAST CT CERVICAL SPINE WITHOUT CONTRAST TECHNIQUE: Multidetector CT imaging of the head and cervical spine was performed following the standard protocol without intravenous contrast. Multiplanar CT image reconstructions of the cervical spine were also generated. RADIATION DOSE REDUCTION: This exam was performed according to the departmental dose-optimization program which includes automated exposure control, adjustment of the mA and/or kV according to patient size and/or use of iterative reconstruction technique. COMPARISON:  Head CT 07/30/2023. Brain MRI 06/21/2022. Cervical spine radiographs 09/13/2008. FINDINGS: CT HEAD FINDINGS Brain: Generalized parenchymal atrophy. Chronic lacunar infarct again noted within the left retrolenticular white matter. Patchy and ill-defined hypoattenuation elsewhere  within the cerebral white matter, nonspecific but compatible with moderate chronic small vessel ischemic disease. There is no acute intracranial hemorrhage. No demarcated cortical infarct. No extra-axial fluid collection. No evidence of an intracranial mass. No midline shift. Vascular: No hyperdense vessel. Atherosclerotic calcifications. Skull: No calvarial fracture or aggressive osseous lesion. Sinuses/Orbits: No mass or acute finding within the imaged orbits. Mild mucosal thickening within the bilateral maxillary sinuses. Complete opacification of the right sphenoid sinus (with associated chronic reactive osteitis). CT CERVICAL SPINE FINDINGS Alignment: 2 mm C7-T1 grade 1 anterolisthesis. Skull base and vertebrae: The basion-dental and atlanto-dental intervals are maintained.No evidence of acute fracture to the cervical spine. Prior C3-C6 ACDF. No evidence of acute hardware compromise. Soft tissues and spinal canal: No prevertebral fluid or swelling. No visible canal hematoma. Disc levels: Prior C3-C6 ACDF. Disc degeneration at the non-operative levels, greatest at C6-C7 (advanced at this level). Small C2-C3 central disc protrusion. Posterior disc osteophyte complex at C6-C7. Multilevel uncovertebral hypertrophy. No appreciable high-grade spinal canal stenosis. Multilevel bony neural foraminal narrowing. Degenerative changes also present at the C1-C2 articulation. Upper chest: Minimal atelectasis within the dependent aspect of the left upper lobe at the imaged levels. No visible pneumothorax. IMPRESSION: CT head: 1.  No evidence of an acute intracranial abnormality. 2. Parenchymal atrophy and chronic small vessel ischemic disease, as described. 3. Paranasal sinus disease (including severe chronic right sphenoid sinusitis). CT cervical spine: 1. No evidence of an acute cervical spine fracture. 2. 2 mm C7-T1 grade 1 anterolisthesis. 3. Cervical spondylosis and post-operative changes, as described. Electronically  Signed   By: Jackey Loge D.O.   On: 09/27/2023 14:41   DG Shoulder Left Result Date: 09/27/2023 CLINICAL DATA:  Pain. EXAM: LEFT SHOULDER - 2+ VIEW COMPARISON:  None Available. FINDINGS: There is  no evidence of acute fracture or dislocation. Mild degenerative changes of the glenohumeral and acromioclavicular joints. Soft tissues are unremarkable. IMPRESSION: 1. No acute osseous abnormality. 2. Mild degenerative changes of the glenohumeral and acromioclavicular joints. Electronically Signed   By: Hart Robinsons M.D.   On: 09/27/2023 13:56   DG Chest Port 1 View Result Date: 09/27/2023 CLINICAL DATA:  Altered mental status and hypoxia. EXAM: PORTABLE CHEST 1 VIEW COMPARISON:  Chest radiograph dated 09/12/2023. FINDINGS: The heart size and mediastinal contours are unchanged. Aortic atherosclerosis. Improved aeration at the left lung base. Streaky opacities at the medial right lung base. No sizable pleural effusion. No pneumothorax. Anterior lower cervical fusion hardware in place. Suture anchor in the right humeral head. No acute osseous abnormality. IMPRESSION: 1. Streaky opacities at the right lung base could reflect atelectasis or infiltrate. 2. Improved aeration at the left lung base. Electronically Signed   By: Hart Robinsons M.D.   On: 09/27/2023 13:54   NM Pulmonary Perfusion Result Date: 09/12/2023 CLINICAL DATA:  Pulmonary embolus suspected with low to intermediate probability. Positive D-dimer. Syncope. EXAM: NUCLEAR MEDICINE PERFUSION LUNG SCAN TECHNIQUE: Perfusion images were obtained in multiple projections after intravenous injection of radiopharmaceutical. Ventilation scans intentionally deferred if perfusion scan and chest x-ray adequate for interpretation during COVID 19 epidemic. RADIOPHARMACEUTICALS:  4.1 mCi Tc-98m MAA IV COMPARISON:  Chest radiograph 09/12/2023. Radionuclide perfusion scan 08/01/2023 FINDINGS: Normal homogeneous distribution of tracer activity demonstrated throughout  both lungs. No perfusion defects are demonstrated. IMPRESSION: Normal examination.  No evidence of acute pulmonary embolus. Electronically Signed   By: Burman Nieves M.D.   On: 09/12/2023 17:14   DG Chest Port 1 View Result Date: 09/12/2023 CLINICAL DATA:  Questionable sepsis EXAM: PORTABLE CHEST 1 VIEW COMPARISON:  None Available. FINDINGS: Normal cardiac silhouette. LEFT basilar atelectasis and small effusion. Upper lungs clear. Anterior cervical fusion IMPRESSION: Small LEFT effusion and basilar atelectasis versus infiltrate. Electronically Signed   By: Genevive Bi M.D.   On: 09/12/2023 10:21   Subjective: Patient was seen and examined at bedside.  Overnight events noted.   She feels much improved and wants to be discharged.  Patient is being discharged to skilled nursing facility.  Discharge Exam: Vitals:   10/06/23 0451 10/06/23 0919  BP: 139/72 122/88  Pulse: 67 84  Resp: 18 16  Temp: 97.6 F (36.4 C)   SpO2: 91% 94%   Vitals:   10/05/23 1615 10/05/23 2031 10/06/23 0451 10/06/23 0919  BP: (!) 140/67 (!) 140/64 139/72 122/88  Pulse: 69 69 67 84  Resp: 18 18 18 16   Temp: 98 F (36.7 C) 98 F (36.7 C) 97.6 F (36.4 C)   TempSrc:  Oral Oral   SpO2: 91% 92% 91% 94%  Weight:   74.5 kg   Height:        General: Pt is alert, awake, not in acute distress Cardiovascular: RRR, S1/S2 +, no rubs, no gallops Respiratory: CTA bilaterally, no wheezing, no rhonchi Abdominal: Soft, NT, ND, bowel sounds + Extremities: no edema, no cyanosis    The results of significant diagnostics from this hospitalization (including imaging, microbiology, ancillary and laboratory) are listed below for reference.     Microbiology: Recent Results (from the past 240 hours)  Resp panel by RT-PCR (RSV, Flu A&B, Covid) Anterior Nasal Swab     Status: Abnormal   Collection Time: 09/27/23 10:42 AM   Specimen: Anterior Nasal Swab  Result Value Ref Range Status   SARS Coronavirus 2 by RT  PCR  POSITIVE (A) NEGATIVE Final   Influenza A by PCR NEGATIVE NEGATIVE Final   Influenza B by PCR NEGATIVE NEGATIVE Final    Comment: (NOTE) The Xpert Xpress SARS-CoV-2/FLU/RSV plus assay is intended as an aid in the diagnosis of influenza from Nasopharyngeal swab specimens and should not be used as a sole basis for treatment. Nasal washings and aspirates are unacceptable for Xpert Xpress SARS-CoV-2/FLU/RSV testing.  Fact Sheet for Patients: BloggerCourse.com  Fact Sheet for Healthcare Providers: SeriousBroker.it  This test is not yet approved or cleared by the [place] FDA and has been authorized for detection and/or diagnosis of SARS-CoV-2 by FDA under an Emergency Use Authorization (EUA). This EUA will remain in effect (meaning this test can be used) for the duration of the COVID-19 declaration under Section 564(b)(1) of the Act, 21 U.S.C. section 360bbb-3(b)(1), unless the authorization is terminated or revoked.     Resp Syncytial Virus by PCR NEGATIVE NEGATIVE Final    Comment: (NOTE) Fact Sheet for Patients: BloggerCourse.com  Fact Sheet for Healthcare Providers: SeriousBroker.it  This test is not yet approved or cleared by the [place] FDA and has been authorized for detection and/or diagnosis of SARS-CoV-2 by FDA under an Emergency Use Authorization (EUA). This EUA will remain in effect (meaning this test can be used) for the duration of the COVID-19 declaration under Section 564(b)(1) of the Act, 21 U.S.C. section 360bbb-3(b)(1), unless the authorization is terminated or revoked.  Performed at [facility], [address], [place], [place] [address]   Resp panel by RT-PCR (RSV, Flu A&B, Covid) Anterior Nasal Swab     Status: None   Collection Time: 10/01/23  2:10 PM   Specimen: Anterior Nasal Swab  Result Value Ref Range Status   SARS Coronavirus 2 by  RT PCR NEGATIVE NEGATIVE Final   Influenza A by PCR NEGATIVE NEGATIVE Final   Influenza B by PCR NEGATIVE NEGATIVE Final    Comment: (NOTE) The Xpert Xpress SARS-CoV-2/FLU/RSV plus assay is intended as an aid in the diagnosis of influenza from Nasopharyngeal swab specimens and should not be used as a sole basis for treatment. Nasal washings and aspirates are unacceptable for Xpert Xpress SARS-CoV-2/FLU/RSV testing.  Fact Sheet for Patients: BloggerCourse.com  Fact Sheet for Healthcare Providers: SeriousBroker.it  This test is not yet approved or cleared by the [place] FDA and has been authorized for detection and/or diagnosis of SARS-CoV-2 by FDA under an Emergency Use Authorization (EUA). This EUA will remain in effect (meaning this test can be used) for the duration of the COVID-19 declaration under Section 564(b)(1) of the Act, 21 U.S.C. section 360bbb-3(b)(1), unless the authorization is terminated or revoked.     Resp Syncytial Virus by PCR NEGATIVE NEGATIVE Final    Comment: (NOTE) Fact Sheet for Patients: BloggerCourse.com  Fact Sheet for Healthcare Providers: SeriousBroker.it  This test is not yet approved or cleared by the [place] FDA and has been authorized for detection and/or diagnosis of SARS-CoV-2 by FDA under an Emergency Use Authorization (EUA). This EUA will remain in effect (meaning this test can be used) for the duration of the COVID-19 declaration under Section 564(b)(1) of the Act, 21 U.S.C. section 360bbb-3(b)(1), unless the authorization is terminated or revoked.  Performed at [facility], [address], [place], [place] [address]      Labs: BNP (last 3 results) Recent Labs    07/30/23 1158 09/27/23 1042 10/01/23 1450  BNP 22.4 50.7 179.6*   Basic  Metabolic Panel: Recent Labs  Lab 10/01/23 1355 10/01/23 1905  10/02/23 0425 10/03/23 0750 10/04/23 0600 10/06/23 0721  NA 139 140 139 137 136 140  K 3.6 3.7 3.6 3.4* 3.3* 3.4*  CL 105  --  105 106 109 103  CO2 22  --  21* 22 21* 24  GLUCOSE 107*  --  97 92 81 88  BUN 16  --  17 17 16 11   CREATININE 1.78*  --  1.56* 1.42* 1.20* 1.35*  CALCIUM 8.6*  --  8.5* 8.2* 7.7* 8.9  MG  --   --   --  1.6* 1.7 1.9  PHOS  --   --   --   --   --  3.1   Liver Function Tests: Recent Labs  Lab 10/01/23 1355 10/02/23 0425  AST 32 27  ALT 15 14  ALKPHOS 34* 36*  BILITOT 0.7 0.8  PROT 5.6* 5.1*  ALBUMIN 3.0* 2.5*   Recent Labs  Lab 10/01/23 1450  LIPASE 30   No results for input(s): "AMMONIA" in the last 168 hours. CBC: Recent Labs  Lab 09/30/23 0629 10/01/23 1355 10/01/23 1905 10/02/23 0425 10/03/23 0750 10/04/23 0600  WBC 3.6* 7.8  --  7.5 6.7 5.6  NEUTROABS 1.6*  --   --   --   --   --   HGB 11.7* 12.1 11.9* 11.2* 10.5* 10.2*  HCT 34.6* 35.5* 35.0* 33.2* 31.3* 30.0*  MCV 92.0 93.2  --  92.2 91.5 90.6  PLT 281 264  --  244 256 273   Cardiac Enzymes: Recent Labs  Lab 10/01/23 1856  CKTOTAL 161   BNP: Invalid input(s): "POCBNP" CBG: Recent Labs  Lab 09/29/23 1613 09/29/23 1949 09/30/23 0459 09/30/23 0914  GLUCAP 129* 153* 104* 113*   D-Dimer No results for input(s): "DDIMER" in the last 72 hours. Hgb A1c No results for input(s): "HGBA1C" in the last 72 hours. Lipid Profile No results for input(s): "CHOL", "HDL", "LDLCALC", "TRIG", "CHOLHDL", "LDLDIRECT" in the last 72 hours. Thyroid function studies No results for input(s): "TSH", "T4TOTAL", "T3FREE", "THYROIDAB" in the last 72 hours.  Invalid input(s): "FREET3" Anemia work up No results for input(s): "VITAMINB12", "FOLATE", "FERRITIN", "TIBC", "IRON", "RETICCTPCT" in the last 72 hours. Urinalysis    Component Value Date/Time   COLORURINE YELLOW 10/01/2023 1919   APPEARANCEUR CLEAR 10/01/2023 1919   LABSPEC 1.012 10/01/2023 1919   PHURINE 5.0 10/01/2023 1919    GLUCOSEU NEGATIVE 10/01/2023 1919   HGBUR NEGATIVE 10/01/2023 1919   BILIRUBINUR NEGATIVE 10/01/2023 1919   KETONESUR NEGATIVE 10/01/2023 1919   PROTEINUR NEGATIVE 10/01/2023 1919   UROBILINOGEN 0.2 03/01/2015 2045   NITRITE NEGATIVE 10/01/2023 1919   LEUKOCYTESUR NEGATIVE 10/01/2023 1919   Sepsis Labs Recent Labs  Lab 10/01/23 1355 10/02/23 0425 10/03/23 0750 10/04/23 0600  WBC 7.8 7.5 6.7 5.6   Microbiology Recent Results (from the past 240 hours)  Resp panel by RT-PCR (RSV, Flu A&B, Covid) Anterior Nasal Swab     Status: Abnormal   Collection Time: 09/27/23 10:42 AM   Specimen: Anterior Nasal Swab  Result Value Ref Range Status   SARS Coronavirus 2 by RT PCR POSITIVE (A) NEGATIVE Final   Influenza A by PCR NEGATIVE NEGATIVE Final   Influenza B by PCR NEGATIVE NEGATIVE Final    Comment: (NOTE) The Xpert Xpress SARS-CoV-2/FLU/RSV plus assay is intended as an aid in the diagnosis of influenza from Nasopharyngeal swab specimens and should not be used as a sole basis for  treatment. Nasal washings and aspirates are unacceptable for Xpert Xpress SARS-CoV-2/FLU/RSV testing.  Fact Sheet for Patients: BloggerCourse.com  Fact Sheet for Healthcare Providers: SeriousBroker.it  This test is not yet approved or cleared by the [place] FDA and has been authorized for detection and/or diagnosis of SARS-CoV-2 by FDA under an Emergency Use Authorization (EUA). This EUA will remain in effect (meaning this test can be used) for the duration of the COVID-19 declaration under Section 564(b)(1) of the Act, 21 U.S.C. section 360bbb-3(b)(1), unless the authorization is terminated or revoked.     Resp Syncytial Virus by PCR NEGATIVE NEGATIVE Final    Comment: (NOTE) Fact Sheet for Patients: BloggerCourse.com  Fact Sheet for Healthcare Providers: SeriousBroker.it  This test is not  yet approved or cleared by the [place] FDA and has been authorized for detection and/or diagnosis of SARS-CoV-2 by FDA under an Emergency Use Authorization (EUA). This EUA will remain in effect (meaning this test can be used) for the duration of the COVID-19 declaration under Section 564(b)(1) of the Act, 21 U.S.C. section 360bbb-3(b)(1), unless the authorization is terminated or revoked.  Performed at [facility], [address], [place], [place] [address]   Resp panel by RT-PCR (RSV, Flu A&B, Covid) Anterior Nasal Swab     Status: None   Collection Time: 10/01/23  2:10 PM   Specimen: Anterior Nasal Swab  Result Value Ref Range Status   SARS Coronavirus 2 by RT PCR NEGATIVE NEGATIVE Final   Influenza A by PCR NEGATIVE NEGATIVE Final   Influenza B by PCR NEGATIVE NEGATIVE Final    Comment: (NOTE) The Xpert Xpress SARS-CoV-2/FLU/RSV plus assay is intended as an aid in the diagnosis of influenza from Nasopharyngeal swab specimens and should not be used as a sole basis for treatment. Nasal washings and aspirates are unacceptable for Xpert Xpress SARS-CoV-2/FLU/RSV testing.  Fact Sheet for Patients: BloggerCourse.com  Fact Sheet for Healthcare Providers: SeriousBroker.it  This test is not yet approved or cleared by the [place] FDA and has been authorized for detection and/or diagnosis of SARS-CoV-2 by FDA under an Emergency Use Authorization (EUA). This EUA will remain in effect (meaning this test can be used) for the duration of the COVID-19 declaration under Section 564(b)(1) of the Act, 21 U.S.C. section 360bbb-3(b)(1), unless the authorization is terminated or revoked.     Resp Syncytial Virus by PCR NEGATIVE NEGATIVE Final    Comment: (NOTE) Fact Sheet for Patients: BloggerCourse.com  Fact Sheet for Healthcare Providers: SeriousBroker.it  This test is  not yet approved or cleared by the [place] FDA and has been authorized for detection and/or diagnosis of SARS-CoV-2 by FDA under an Emergency Use Authorization (EUA). This EUA will remain in effect (meaning this test can be used) for the duration of the COVID-19 declaration under Section 564(b)(1) of the Act, 21 U.S.C. section 360bbb-3(b)(1), unless the authorization is terminated or revoked.  Performed at [facility], [address], [place], [place] [address]      Time coordinating discharge: Over 30 minutes  SIGNED:   Willeen Niece, MD  [facility] 10/06/2023, 12:01 PM Pager   If 7PM-7AM, please contact night-coverage

## 2023-10-06 NOTE — Discharge Instructions (Signed)
 Advised to follow-up with primary care physician in 1 week. Patient has completed antibiotics course for pneumonia.   Patient being discharged to skilled nursing facility for rehab

## 2023-10-06 NOTE — TOC Transition Note (Signed)
 Transition of Care [facility]) - Discharge Note   Patient Details  Name: Adriana Jordan MRN: [id number] Date of Birth: 1935-06-23  Transition of Care [facility] CM/SW Contact:  Aron Inge A [place], LCSW Phone Number: 10/06/2023, 11:51 AM   Clinical Narrative:     Patient will DC to: [facility]  Anticipated DC date: 10/06/23  Family notified: Delila Pereyra, son   Transport byTheodoro Grist ID [id number]   Reference ID [id number]   Approval Dates: 10/05/2023-10/07/2023    Per MD patient ready for DC to [facility] [place]. RN, patient, patient's family, and facility notified of DC. Discharge Summary and FL2 sent to facility. RN to call report prior to discharge [phone] , room 710). DC packet on chart. Ambulance transport requested for patient.     CSW will sign off for now as social work intervention is no longer needed. Please consult [place] again if new needs arise.    Final next level of care: Skilled Nursing Facility Barriers to Discharge: Barriers Resolved   Patient Goals and CMS Choice            Discharge Placement              Patient chooses bed at: [facility],  Patient to be transferred to facility by: [facility] Name of family member notified: Delila Pereyra, pt's son Patient and family notified of of transfer: 10/06/23  Discharge Plan and Services Additional resources added to the After Visit Summary for                                       Social Drivers of Health (SDOH) Interventions SDOH Screenings   Food Insecurity: No Food Insecurity (10/02/2023)  Housing: Low Risk  (10/02/2023)  Transportation Needs: No Transportation Needs (10/02/2023)  Utilities: Not At Risk (10/02/2023)  Social Connections: Moderately Integrated (10/02/2023)  Tobacco Use: Medium Risk (10/01/2023)     Readmission Risk Interventions    02/15/2023    1:22 PM  Readmission Risk Prevention Plan  Post Dischage Appt Complete  Medication Screening Complete  Transportation Screening  Complete

## 2023-10-06 NOTE — Progress Notes (Signed)
 RN gave hand-off report to [facility] RN [employer] [phone])

## 2023-10-13 ENCOUNTER — Other Ambulatory Visit: Payer: Self-pay | Admitting: *Deleted

## 2023-10-13 DIAGNOSIS — D352 Benign neoplasm of pituitary gland: Secondary | ICD-10-CM

## 2023-10-17 ENCOUNTER — Telehealth: Payer: Self-pay | Admitting: Internal Medicine

## 2023-10-26 ENCOUNTER — Other Ambulatory Visit: Payer: Self-pay | Admitting: Radiation Therapy

## 2023-11-03 ENCOUNTER — Ambulatory Visit (HOSPITAL_COMMUNITY)
Admission: RE | Admit: 2023-11-03 | Discharge: 2023-11-03 | Disposition: A | Discharge status: Home / Self Care | Source: Ambulatory Visit | Attending: Internal Medicine | Admitting: Internal Medicine

## 2023-11-03 DIAGNOSIS — D352 Benign neoplasm of pituitary gland: Secondary | ICD-10-CM | POA: Insufficient documentation

## 2023-11-03 MED ORDER — GADOBUTROL 1 MMOL/ML IV SOLN
7.0000 mL | Freq: Once | INTRAVENOUS | Status: AC | PRN
  Administered 2023-11-03: 7 mL via INTRAVENOUS

## 2023-11-07 ENCOUNTER — Inpatient Hospital Stay

## 2023-11-08 ENCOUNTER — Encounter (HOSPITAL_COMMUNITY): Payer: Self-pay

## 2023-11-08 ENCOUNTER — Emergency Department (HOSPITAL_COMMUNITY)

## 2023-11-08 ENCOUNTER — Other Ambulatory Visit: Payer: Self-pay

## 2023-11-08 ENCOUNTER — Inpatient Hospital Stay (HOSPITAL_COMMUNITY)
Admission: EM | Admit: 2023-11-08 | Discharge: 2023-11-11 | DRG: 175 | Disposition: A | Discharge status: Home / Self Care | Attending: Internal Medicine | Admitting: Internal Medicine

## 2023-11-08 DIAGNOSIS — I2699 Other pulmonary embolism without acute cor pulmonale: Principal | ICD-10-CM | POA: Diagnosis present

## 2023-11-08 DIAGNOSIS — Z885 Allergy status to narcotic agent status: Secondary | ICD-10-CM

## 2023-11-08 DIAGNOSIS — Z66 Do not resuscitate: Secondary | ICD-10-CM | POA: Diagnosis present

## 2023-11-08 DIAGNOSIS — M7989 Other specified soft tissue disorders: Secondary | ICD-10-CM | POA: Diagnosis not present

## 2023-11-08 DIAGNOSIS — Z7982 Long term (current) use of aspirin: Secondary | ICD-10-CM

## 2023-11-08 DIAGNOSIS — I82431 Acute embolism and thrombosis of right popliteal vein: Secondary | ICD-10-CM | POA: Diagnosis present

## 2023-11-08 DIAGNOSIS — I82441 Acute embolism and thrombosis of right tibial vein: Secondary | ICD-10-CM | POA: Diagnosis present

## 2023-11-08 DIAGNOSIS — Z801 Family history of malignant neoplasm of trachea, bronchus and lung: Secondary | ICD-10-CM

## 2023-11-08 DIAGNOSIS — Z7189 Other specified counseling: Secondary | ICD-10-CM | POA: Diagnosis not present

## 2023-11-08 DIAGNOSIS — Z85828 Personal history of other malignant neoplasm of skin: Secondary | ICD-10-CM | POA: Diagnosis not present

## 2023-11-08 DIAGNOSIS — I82411 Acute embolism and thrombosis of right femoral vein: Secondary | ICD-10-CM | POA: Diagnosis present

## 2023-11-08 DIAGNOSIS — J9601 Acute respiratory failure with hypoxia: Secondary | ICD-10-CM | POA: Diagnosis present

## 2023-11-08 DIAGNOSIS — E7800 Pure hypercholesterolemia, unspecified: Secondary | ICD-10-CM | POA: Diagnosis present

## 2023-11-08 DIAGNOSIS — I2489 Other forms of acute ischemic heart disease: Secondary | ICD-10-CM | POA: Diagnosis present

## 2023-11-08 DIAGNOSIS — R0600 Dyspnea, unspecified: Secondary | ICD-10-CM | POA: Diagnosis not present

## 2023-11-08 DIAGNOSIS — R55 Syncope and collapse: Secondary | ICD-10-CM | POA: Diagnosis present

## 2023-11-08 DIAGNOSIS — E872 Acidosis, unspecified: Secondary | ICD-10-CM | POA: Diagnosis present

## 2023-11-08 DIAGNOSIS — I130 Hypertensive heart and chronic kidney disease with heart failure and stage 1 through stage 4 chronic kidney disease, or unspecified chronic kidney disease: Secondary | ICD-10-CM | POA: Diagnosis present

## 2023-11-08 DIAGNOSIS — Z7901 Long term (current) use of anticoagulants: Secondary | ICD-10-CM

## 2023-11-08 DIAGNOSIS — R680 Hypothermia, not associated with low environmental temperature: Secondary | ICD-10-CM | POA: Diagnosis present

## 2023-11-08 DIAGNOSIS — I5032 Chronic diastolic (congestive) heart failure: Secondary | ICD-10-CM | POA: Diagnosis present

## 2023-11-08 DIAGNOSIS — I2510 Atherosclerotic heart disease of native coronary artery without angina pectoris: Secondary | ICD-10-CM | POA: Diagnosis present

## 2023-11-08 DIAGNOSIS — Z8616 Personal history of COVID-19: Secondary | ICD-10-CM

## 2023-11-08 DIAGNOSIS — N179 Acute kidney failure, unspecified: Secondary | ICD-10-CM | POA: Diagnosis not present

## 2023-11-08 DIAGNOSIS — Z955 Presence of coronary angioplasty implant and graft: Secondary | ICD-10-CM

## 2023-11-08 DIAGNOSIS — Z515 Encounter for palliative care: Secondary | ICD-10-CM | POA: Diagnosis not present

## 2023-11-08 DIAGNOSIS — E860 Dehydration: Secondary | ICD-10-CM | POA: Diagnosis present

## 2023-11-08 DIAGNOSIS — E2740 Unspecified adrenocortical insufficiency: Secondary | ICD-10-CM | POA: Diagnosis present

## 2023-11-08 DIAGNOSIS — Z79899 Other long term (current) drug therapy: Secondary | ICD-10-CM

## 2023-11-08 DIAGNOSIS — J9811 Atelectasis: Secondary | ICD-10-CM | POA: Diagnosis present

## 2023-11-08 DIAGNOSIS — Z981 Arthrodesis status: Secondary | ICD-10-CM

## 2023-11-08 DIAGNOSIS — Z803 Family history of malignant neoplasm of breast: Secondary | ICD-10-CM

## 2023-11-08 DIAGNOSIS — I252 Old myocardial infarction: Secondary | ICD-10-CM

## 2023-11-08 DIAGNOSIS — I82453 Acute embolism and thrombosis of peroneal vein, bilateral: Secondary | ICD-10-CM | POA: Diagnosis present

## 2023-11-08 DIAGNOSIS — R7401 Elevation of levels of liver transaminase levels: Secondary | ICD-10-CM | POA: Diagnosis present

## 2023-11-08 DIAGNOSIS — N1832 Chronic kidney disease, stage 3b: Secondary | ICD-10-CM | POA: Diagnosis present

## 2023-11-08 DIAGNOSIS — Z87891 Personal history of nicotine dependence: Secondary | ICD-10-CM

## 2023-11-08 DIAGNOSIS — Z1152 Encounter for screening for COVID-19: Secondary | ICD-10-CM

## 2023-11-08 DIAGNOSIS — T68XXXA Hypothermia, initial encounter: Secondary | ICD-10-CM

## 2023-11-08 DIAGNOSIS — K219 Gastro-esophageal reflux disease without esophagitis: Secondary | ICD-10-CM | POA: Diagnosis present

## 2023-11-08 DIAGNOSIS — Z809 Family history of malignant neoplasm, unspecified: Secondary | ICD-10-CM

## 2023-11-08 DIAGNOSIS — Z8673 Personal history of transient ischemic attack (TIA), and cerebral infarction without residual deficits: Secondary | ICD-10-CM

## 2023-11-08 DIAGNOSIS — I493 Ventricular premature depolarization: Secondary | ICD-10-CM | POA: Diagnosis present

## 2023-11-08 DIAGNOSIS — G8929 Other chronic pain: Secondary | ICD-10-CM | POA: Diagnosis present

## 2023-11-08 DIAGNOSIS — J189 Pneumonia, unspecified organism: Secondary | ICD-10-CM

## 2023-11-08 DIAGNOSIS — Z923 Personal history of irradiation: Secondary | ICD-10-CM

## 2023-11-08 DIAGNOSIS — M545 Low back pain, unspecified: Secondary | ICD-10-CM | POA: Diagnosis present

## 2023-11-08 LAB — CBC WITH DIFFERENTIAL/PLATELET
Abs Immature Granulocytes: 0.02 10*3/uL (ref 0.00–0.07)
Basophils Absolute: 0 10*3/uL (ref 0.0–0.1)
Basophils Relative: 1 %
Eosinophils Absolute: 0.2 10*3/uL (ref 0.0–0.5)
Eosinophils Relative: 3 %
HCT: 38.2 % (ref 36.0–46.0)
Hemoglobin: 12.1 g/dL (ref 12.0–15.0)
Immature Granulocytes: 0 %
Lymphocytes Relative: 30 %
Lymphs Abs: 1.6 10*3/uL (ref 0.7–4.0)
MCH: 30.6 pg (ref 26.0–34.0)
MCHC: 31.7 g/dL (ref 30.0–36.0)
MCV: 96.7 fL (ref 80.0–100.0)
Monocytes Absolute: 0.5 10*3/uL (ref 0.1–1.0)
Monocytes Relative: 9 %
Neutro Abs: 3 10*3/uL (ref 1.7–7.7)
Neutrophils Relative %: 57 %
Platelets: 279 10*3/uL (ref 150–400)
RBC: 3.95 MIL/uL (ref 3.87–5.11)
RDW: 16.2 % — ABNORMAL HIGH (ref 11.5–15.5)
WBC: 5.3 10*3/uL (ref 4.0–10.5)
nRBC: 0 % (ref 0.0–0.2)

## 2023-11-08 LAB — COMPREHENSIVE METABOLIC PANEL WITH GFR
ALT: 27 U/L (ref 0–44)
AST: 52 U/L — ABNORMAL HIGH (ref 15–41)
Albumin: 4.3 g/dL (ref 3.5–5.0)
Alkaline Phosphatase: 59 U/L (ref 38–126)
Anion gap: 17 — ABNORMAL HIGH (ref 5–15)
BUN: 24 mg/dL — ABNORMAL HIGH (ref 8–23)
CO2: 23 mmol/L (ref 22–32)
Calcium: 9.8 mg/dL (ref 8.9–10.3)
Chloride: 97 mmol/L — ABNORMAL LOW (ref 98–111)
Creatinine, Ser: 1.72 mg/dL — ABNORMAL HIGH (ref 0.44–1.00)
GFR, Estimated: 28 mL/min — ABNORMAL LOW (ref >60)
Glucose, Bld: 120 mg/dL — ABNORMAL HIGH (ref 70–99)
Potassium: 4.1 mmol/L (ref 3.5–5.1)
Sodium: 137 mmol/L (ref 135–145)
Total Bilirubin: 0.6 mg/dL (ref 0.0–1.2)
Total Protein: 6.9 g/dL (ref 6.5–8.1)

## 2023-11-08 LAB — PROTIME-INR
INR: 0.9 (ref 0.8–1.2)
Prothrombin Time: 12.7 s (ref 11.4–15.2)

## 2023-11-08 LAB — RESP PANEL BY RT-PCR (RSV, FLU A&B, COVID)  RVPGX2
Influenza A by PCR: NEGATIVE
Influenza B by PCR: NEGATIVE
Resp Syncytial Virus by PCR: NEGATIVE
SARS Coronavirus 2 by RT PCR: NEGATIVE

## 2023-11-08 LAB — I-STAT CG4 LACTIC ACID, ED
Lactic Acid, Venous: 2.5 mmol/L (ref 0.5–1.9)
Lactic Acid, Venous: 1.3 mmol/L (ref 0.5–1.9)

## 2023-11-08 LAB — APTT: aPTT: 26 s (ref 24–36)

## 2023-11-08 LAB — D-DIMER, QUANTITATIVE: D-Dimer, Quant: 9.95 ug{FEU}/mL — ABNORMAL HIGH (ref 0.00–0.50)

## 2023-11-08 LAB — BRAIN NATRIURETIC PEPTIDE: B Natriuretic Peptide: 14.9 pg/mL (ref 0.0–100.0)

## 2023-11-08 MED ORDER — HEPARIN BOLUS VIA INFUSION
4500.0000 [IU] | Freq: Once | INTRAVENOUS | Status: AC
  Administered 2023-11-09: 4500 [IU] via INTRAVENOUS
  Filled 2023-11-08: qty 4500

## 2023-11-08 MED ORDER — LACTATED RINGERS IV BOLUS (SEPSIS)
500.0000 mL | Freq: Once | INTRAVENOUS | Status: AC
  Administered 2023-11-08: 500 mL via INTRAVENOUS

## 2023-11-08 MED ORDER — HEPARIN (PORCINE) 25000 UT/250ML-% IV SOLN
1200.0000 [IU]/h | INTRAVENOUS | Status: DC
  Administered 2023-11-09: 1200 [IU]/h via INTRAVENOUS

## 2023-11-08 MED ORDER — LACTATED RINGERS IV SOLN: INTRAVENOUS | Status: DC

## 2023-11-08 MED ORDER — VANCOMYCIN HCL 1500 MG/300ML IV SOLN
1500.0000 mg | Freq: Once | INTRAVENOUS | Status: AC
  Administered 2023-11-08: 1500 mg via INTRAVENOUS
  Filled 2023-11-08: qty 300

## 2023-11-08 MED ORDER — SODIUM CHLORIDE 0.9 % IV SOLN
2.0000 g | Freq: Once | INTRAVENOUS | Status: AC
  Administered 2023-11-08: 2 g via INTRAVENOUS
  Filled 2023-11-08: qty 12.5

## 2023-11-08 MED ORDER — VANCOMYCIN HCL IN DEXTROSE 1-5 GM/200ML-% IV SOLN: 1000.0000 mg | Freq: Once | INTRAVENOUS | Status: DC

## 2023-11-08 NOTE — Discharge Instructions (Addendum)
 Information on my medicine - ELIQUIS (apixaban)  Why was Eliquis prescribed for you? Eliquis was prescribed to treat blood clots that may have been found in the veins of your legs (deep vein thrombosis) or in your lungs (pulmonary embolism) and to reduce the risk of them occurring again.  What do You need to know about Eliquis ? The starting dose is 10 mg (two 5 mg tablets) taken TWICE daily for the FIRST SEVEN (7) DAYS, then on 4/10  the dose is reduced to ONE 5 mg tablet taken TWICE daily.  Eliquis may be taken with or without food.   Try to take the dose about the same time in the morning and in the evening. If you have difficulty swallowing the tablet whole please discuss with your pharmacist how to take the medication safely.  Take Eliquis exactly as prescribed and DO NOT stop taking Eliquis without talking to the doctor who prescribed the medication.  Stopping may increase your risk of developing a new blood clot.  Refill your prescription before you run out.  After discharge, you should have regular check-up appointments with your healthcare provider that is prescribing your Eliquis.    What do you do if you miss a dose? If a dose of ELIQUIS is not taken at the scheduled time, take it as soon as possible on the same day and twice-daily administration should be resumed. The dose should not be doubled to make up for a missed dose.  Important Safety Information A possible side effect of Eliquis is bleeding. You should call your healthcare provider right away if you experience any of the following: Bleeding from an injury or your nose that does not stop. Unusual colored urine (red or dark brown) or unusual colored stools (red or black). Unusual bruising for unknown reasons. A serious fall or if you hit your head (even if there is no bleeding).  Some medicines may interact with Eliquis and might increase your risk of bleeding or clotting while on Eliquis. To help avoid this,  consult your healthcare provider or pharmacist prior to using any new prescription or non-prescription medications, including herbals, vitamins, non-steroidal anti-inflammatory drugs (NSAIDs) and supplements.  This website has more information on Eliquis (apixaban): http://www.eliquis.com/eliquis/home

## 2023-11-08 NOTE — ED Triage Notes (Addendum)
 Pt presents to ED after syncopal episode at home on porch. Pt cool to touch on EMS arrival, thermal blanket placed. Pt alert and oriented to person, place, and time. Pt disoriented to situation. Pt reports pain to right side of rib cage. Pt placed on 6L O2 with EMS. Edema noted to lower extremities. Pt reports shortness of breath and does not normally wear oxygen. GCS 14.  EMS vitals:  22-24 rr 94.8 thermal blanket  92-96% on 6L  122 cbg 18 RAC 96 systolic

## 2023-11-08 NOTE — ED Provider Notes (Cosign Needed Addendum)
 [place] EMERGENCY DEPARTMENT AT [facility] Provider Note   CSN: [id number] Arrival date & time: 11/08/23  1723     History  Adriana Jordan is a 89 y.o. female with PMHx CAD, CVA on Plavix, syncope, GERD, pituitary adenoma status postsurgical intervention and radiation, adrenal insufficiency on hydrocortisone who presented to the emergency room with hypothermia to 94 and an oxygenation to 88%.  Of note, she was discharged from the hospital on 10/06/2023 for pneumonia and was sent to a nursing facility.  She subsequently returned home, and today, fell, had syncopal episode, outside on her deck.  Patient reports that she did not experience a syncopal event but a syncopal event was reported by family members.  She was then seen by EMS and subsequently brought to [facility].  Patient denies fever, chills, urinary symptoms, abdominal pain, nausea, vomiting, diarrhea.  Patient does report that she has shortness of breath that has persisted since her last discharge from the hospital.  Patient denies hitting her head.  She has no unilateral weakness and had no seizure-like activity prior to the syncopal event.  Patient denies chest pain or heart palpitations.  Home Medications Prior to Admission medications   Medication Sig Start Date End Date Taking? Authorizing Provider  aspirin EC 81 MG tablet Take 1 tablet (81 mg total) by mouth daily. Swallow whole. 09/30/23  Yes Dahal, Melina Schools, MD  cetirizine (ZYRTEC) 10 MG tablet Take 10 mg by mouth daily.   Yes [provider]  diclofenac Sodium (VOLTAREN) 1 % GEL Apply 4 g topically as needed (shoulder pain).   Yes [provider]  feeding supplement (ENSURE ENLIVE / ENSURE PLUS) LIQD Take 237 mLs by mouth 2 (two) times daily between meals. Patient taking differently: Take 1-2 Bottles by mouth daily as needed (skipped meal). 02/20/23  Yes Rinaldo Cloud, MD  hydrocortisone (CORTEF) 10 MG tablet Take 1 tablet (10 mg  total) by mouth every evening. 09/30/23  Yes Dahal, Melina Schools, MD  hydrocortisone (CORTEF) 20 MG tablet Take 1 tablet (20 mg total) by mouth every morning. 09/30/23  Yes Dahal, Melina Schools, MD  ibuprofen (ADVIL) 200 MG tablet Take 400 mg by mouth every 6 (six) hours as needed for headache or mild pain (pain score 1-3) (shoulder pain).   Yes [provider]  lidocaine 4 % Place 1 patch onto the skin daily.   Yes [provider]  rosuvastatin (CRESTOR) 20 MG tablet Take 20 mg by mouth every evening. 10/15/22  Yes [provider]  torsemide (DEMADEX) 10 MG tablet Take 10 mg by mouth daily as needed (fluid, swelling).   Yes [provider]      Allergies    Codeine    Review of Systems   Review of Systems  Constitutional:  Positive for fatigue. Negative for activity change, appetite change and fever.  HENT:  Negative for congestion and sore throat.   Eyes:  Negative for discharge.  Respiratory:  Positive for cough and shortness of breath.   Cardiovascular:  Positive for leg swelling. Negative for chest pain and palpitations.  Gastrointestinal:  Negative for abdominal pain, diarrhea, nausea and vomiting.  Neurological:  Positive for syncope and weakness.    Physical Exam Updated Vital Signs BP (!) 145/79   Pulse (!) 59   Temp (!) 97.5 F (36.4 C)   Resp 19   Ht 5\' 6"  (1.676 m)   Wt 74.8 kg   LMP  (LMP Unknown)   SpO2 100%  BMI 26.63 kg/m  Physical Exam Constitutional:      General: She is not in acute distress.    Appearance: She is not ill-appearing or toxic-appearing.  HENT:     Head: Normocephalic and atraumatic.     Nose: Nose normal. No congestion.     Mouth/Throat:     Mouth: Mucous membranes are moist.  Eyes:     Extraocular Movements: Extraocular movements intact.     Conjunctiva/sclera: Conjunctivae normal.  Cardiovascular:     Rate and Rhythm: Normal rate and regular rhythm.     Pulses: Normal pulses.  Pulmonary:     Breath sounds: No  wheezing.     Comments: Slightly increased effort and coarse breath sounds throughout  Musculoskeletal:     Cervical back: Normal range of motion.  Neurological:     General: No focal deficit present.     Mental Status: She is alert and oriented to person, place, and time.     Cranial Nerves: No cranial nerve deficit.     Sensory: No sensory deficit.     Motor: No weakness.  Psychiatric:        Mood and Affect: Mood normal.     ED Results / Procedures / Treatments   Labs (all labs ordered are listed, but only abnormal results are displayed) Labs Reviewed  COMPREHENSIVE METABOLIC PANEL WITH GFR - Abnormal; Notable for the following components:      Result Value   Chloride 97 (*)    Glucose, Bld 120 (*)    BUN 24 (*)    Creatinine, Ser 1.72 (*)    AST 52 (*)    GFR, Estimated 28 (*)    Anion gap 17 (*)    All other components within normal limits  CBC WITH DIFFERENTIAL/PLATELET - Abnormal; Notable for the following components:   RDW 16.2 (*)    All other components within normal limits  D-DIMER, QUANTITATIVE - Abnormal; Notable for the following components:   D-Dimer, Quant 9.95 (*)    All other components within normal limits  I-STAT CG4 LACTIC ACID, ED - Abnormal; Notable for the following components:   Lactic Acid, Venous 2.5 (*)    All other components within normal limits  RESP PANEL BY RT-PCR (RSV, FLU A&B, COVID)  RVPGX2  CULTURE, BLOOD (ROUTINE X 2)  CULTURE, BLOOD (ROUTINE X 2)  PROTIME-INR  APTT  BRAIN NATRIURETIC PEPTIDE  I-STAT CG4 LACTIC ACID, ED    EKG EKG Interpretation Date/Time:  Tuesday November 08 2023 19:41:05 EDT Ventricular Rate:  96 PR Interval:  193 QRS Duration:  85 QT Interval:  362 QTC Calculation: 458 R Axis:   -66  Text Interpretation: Sinus rhythm Multiple ventricular premature complexes Left anterior fascicular block Abnormal R-wave progression, late transition when comapred to prior, new PVC present. No STEMI Confirmed by Theda Belfast ([id number]) on 11/08/2023 7:54:37 PM   Radiology CT HEAD WO CONTRAST ( ) Result Date: 11/08/2023 CLINICAL DATA:  Syncope EXAM: CT HEAD WITHOUT CONTRAST TECHNIQUE: Contiguous axial images were obtained from the base of the skull through the vertex without intravenous contrast. RADIATION DOSE REDUCTION: This exam was performed according to the departmental dose-optimization program which includes automated exposure control, adjustment of the mA and/or kV according to patient size and/or use of iterative reconstruction technique. COMPARISON:  10/01/2023 FINDINGS: Brain: Normal anatomic configuration. Parenchymal volume loss is commensurate with the patient's age. Stable moderate periventricular white matter changes are present likely reflecting the sequela of small vessel ischemia. No  abnormal intra or extra-axial mass lesion or fluid collection. No abnormal mass effect or midline shift. No evidence of acute intracranial hemorrhage or infarct. Ventricular size is normal. Cerebellum unremarkable. Vascular: No asymmetric hyperdense vasculature at the skull base. Skull: Intact Sinuses/Orbits: Paranasal sinuses are clear. Orbits are unremarkable. Other: Mastoid air cells and middle ear cavities are clear. IMPRESSION: 1. No acute intracranial hemorrhage or infarct. 2. Stable senescent change. Electronically Signed   By: Helyn Numbers M.D.   On: 11/08/2023 23:19   DG Chest Port 1 View Result Date: 11/08/2023 CLINICAL DATA:  Sepsis.  Low O2 sat EXAM: PORTABLE CHEST 1 VIEW COMPARISON:  X-ray 10/01/2023 and older FINDINGS: Hyperinflation. Chronic lung changes. There is some bandlike changes at the left lung base. Increased from previous. Favor atelectasis. Recommend follow-up. No pneumothorax, effusion or edema. Normal cardiopericardial silhouette calcified aorta. Calcified tracheobronchial tree. Fixation hardware at the edge of the imaging field along the lower cervical spine. IMPRESSION: Hyperinflation with chronic  changes. Increasing bandlike opacity left lung base such as atelectasis. Electronically Signed   By: Karen Kays M.D.   On: 11/08/2023 18:51    Procedures None   Medications Ordered in ED Medications  ceFEPIme (MAXIPIME) 2 g in sodium chloride 0.9 % 100 mL IVPB (0 g Intravenous Stopped 11/08/23 1934)  lactated ringers bolus 500 mL (0 mLs Intravenous Stopped 11/08/23 2213)  vancomycin (VANCOREADY) IVPB 1500 mg/300 mL (0 mg Intravenous Stopped 11/08/23 2224)    ED Course/ Medical Decision Making/ A&P                                 Medical Decision Making Amount and/or Complexity of Data Reviewed Labs: ordered. Radiology: ordered.  Risk Prescription drug management. Decision regarding hospitalization.   Medical Decision Making:   PARILEE HALLY is a 89 y.o. female who presented to the ED today with suspected syncopal event and subsequent transient change in mentation that had resolved at time of evaluation detailed above.    Patient placed on continuous vitals and telemetry monitoring while in ED which was reviewed periodically.   Complete initial physical exam performed, notably the patient was hypothermic and hypoxic to 80s on arrival, not on oxygen at home.    Reviewed and confirmed nursing documentation for past medical history, family history, social history.    Initial Assessment:   With the patient's presentation of dyspnea and syncope, most likely diagnosis is PNA and dehydration. Other diagnoses were considered including (but not limited to) PE, PTX, cardiac etiology, stroke/sz disorder. These are considered less likely due to history of present illness and physical exam findings.   This is most consistent with an acute complicated illness  Initial Plan:  Kept patient on oxygen and started baby bolus of fluid.   Screening labs including CBC and Metabolic panel to evaluate for infectious or metabolic etiology of disease.  CXR to evaluate for structural/infectious  intrathoracic pathology. Left lung base opacity.  EKG to evaluate for cardiac pathology new PVCs Objective evaluation as below reviewed   Initial Study Results:   Laboratory  All laboratory results reviewed without evidence of clinically relevant pathology.   Exceptions include: Lactate 2.5, Cr 1.72   EKG EKG was reviewed independently.   Radiology:  All images reviewed independently. Agree with radiology report at this time.   CT HEAD WO CONTRAST ( ) Result Date: 11/08/2023 CLINICAL DATA:  Syncope EXAM: CT HEAD WITHOUT CONTRAST TECHNIQUE: Contiguous axial  images were obtained from the base of the skull through the vertex without intravenous contrast. RADIATION DOSE REDUCTION: This exam was performed according to the departmental dose-optimization program which includes automated exposure control, adjustment of the mA and/or kV according to patient size and/or use of iterative reconstruction technique. COMPARISON:  10/01/2023 FINDINGS: Brain: Normal anatomic configuration. Parenchymal volume loss is commensurate with the patient's age. Stable moderate periventricular white matter changes are present likely reflecting the sequela of small vessel ischemia. No abnormal intra or extra-axial mass lesion or fluid collection. No abnormal mass effect or midline shift. No evidence of acute intracranial hemorrhage or infarct. Ventricular size is normal. Cerebellum unremarkable. Vascular: No asymmetric hyperdense vasculature at the skull base. Skull: Intact Sinuses/Orbits: Paranasal sinuses are clear. Orbits are unremarkable. Other: Mastoid air cells and middle ear cavities are clear. IMPRESSION: 1. No acute intracranial hemorrhage or infarct. 2. Stable senescent change. Electronically Signed   By: Helyn Numbers M.D.   On: 11/08/2023 23:19   DG Chest Port 1 View Result Date: 11/08/2023 CLINICAL DATA:  Sepsis.  Low O2 sat EXAM: PORTABLE CHEST 1 VIEW COMPARISON:  X-ray 10/01/2023 and older FINDINGS:  Hyperinflation. Chronic lung changes. There is some bandlike changes at the left lung base. Increased from previous. Favor atelectasis. Recommend follow-up. No pneumothorax, effusion or edema. Normal cardiopericardial silhouette calcified aorta. Calcified tracheobronchial tree. Fixation hardware at the edge of the imaging field along the lower cervical spine. IMPRESSION: Hyperinflation with chronic changes. Increasing bandlike opacity left lung base such as atelectasis. Electronically Signed   By: Karen Kays M.D.   On: 11/08/2023 18:51   MR Brain W Wo Contrast Result Date: 11/03/2023 CLINICAL DATA:  Brain/CNS neoplasm, monitor. History of pituitary adenoma status post surgery and radiation. EXAM: MRI HEAD WITHOUT AND WITH CONTRAST TECHNIQUE: Multiplanar, multiecho pulse sequences of the brain and surrounding structures were obtained without and with intravenous contrast. CONTRAST:  7mL GADAVIST GADOBUTROL 1 MMOL/ML IV SOLN COMPARISON:  Head CT 10/01/2023. Noncontrast head MRI 06/21/2022. Contrasted head MRI 03/25/2022. FINDINGS: Brain: There is no evidence of an acute infarct, midline shift, or extra-axial fluid collection. Patchy to confluent T2 hyperintensities in the cerebral white matter are similar to the prior MRI and are nonspecific but compatible with moderate chronic small vessel ischemic disease. There is moderate cerebral atrophy. Chronic lacunar infarcts are noted in the left basal ganglia. There are also small chronic bilateral cerebellar infarcts. Dedicated imaging was performed through the sella turcica. Residual heterogeneously enhancing soft tissue in the sella has mildly decreased in volume from 03/25/2022, most conspicuously on coronal imaging where this tissue has decreased in height. Masslike tissue eccentrically in the left lateral aspect of the sella measures 13 x 7 mm on sagittal images (series 13, image 9, previously 14 x 8 mm). This tissue is again noted to bulge into the left  cavernous sinus. Rightward deviation of the pituitary stalk is unchanged. There is no suprasellar extension. The optic chiasm is unremarkable. Vascular: Major intracranial arterial flow voids are preserved. Chronically absent normal flow voids in the left transverse and sigmoid sinuses presumably reflecting slow flow given enhancement. Skull and upper cervical spine: Unremarkable bone marrow signal. Partially visualized anterior cervical spine fusion. Sinuses/Orbits: Unremarkable orbits. Minimal mucosal thickening in the paranasal sinuses. Clear mastoid air cells. Other: None. IMPRESSION: 1. Slightly decreased volume of residual pituitary adenoma since 03/25/2022. 2. Moderate chronic small vessel ischemic disease. Electronically Signed   By: Sebastian Ache M.D.   On: 11/03/2023 14:49   Consults:  Plan to admit given new oxygen requirement with suspected PNA as insult.   Final Assessment and Plan:    ?PNA with new oxygen requirement after recent discharge from hospital for PNA 10/06/23. Elevated D-dimer (chronically elevated). Started heparin drip and anticipate V/Q scan in AM.   Head CT without evidence of bleed or stroke.   Clinical Impression:  1. Dyspnea, unspecified type   2. Pneumonia due to infectious organism, unspecified laterality, unspecified part of lung      Admit to unassigned    Final Clinical Impression(s) / ED Diagnoses Final diagnoses:  Pneumonia due to infectious organism, unspecified laterality, unspecified part of lung  Dyspnea, unspecified type    Rx / DC Orders ED Discharge Orders     None        Alfredo Martinez, MD 11/08/23 2308    Alfredo Martinez, MD 11/08/23 2327    Tegeler, Canary Brim, MD 11/11/23 [phone]

## 2023-11-08 NOTE — ED Notes (Signed)
 Difficulty obtaining second set of blood cultures. Unsuccessful IV start attempt. Phlebotomy asked to see patient. MD notified of delay.

## 2023-11-08 NOTE — Progress Notes (Addendum)
 PHARMACY - ANTICOAGULATION CONSULT NOTE  Pharmacy Consult for IV heparin Indication: pulmonary embolus  Allergies  Allergen Reactions   Codeine Nausea And Vomiting    Tolerates hydrocodone    Patient Measurements: Height: 5\' 6"  (167.6 cm) Weight: 74.8 kg (165 lb) IBW/kg (Calculated) : 59.3 HEPARIN DW (KG): 74.3  Vital Signs: Temp: 97.5 F (36.4 C) (04/01 1934) Temp Source: Oral (04/01 1730) BP: 145/79 (04/01 2145) Pulse Rate: 59 (04/01 2145)  Labs: Recent Labs    11/08/23 1812  HGB 12.1  HCT 38.2  PLT 279  APTT 26  LABPROT 12.7  INR 0.9  CREATININE 1.72*    Estimated Creatinine Clearance: 23.8 mL/min (A) (by C-G formula based on SCr of 1.72 mg/dL (H)).   Medical History: Past Medical History:  Diagnosis Date   Acute respiratory failure with hypoxia (HCC) 11/15/2017   AKI (acute kidney injury) (HCC) 11/15/2017   Arthritis    "all over" (11/14/2017)   Brain tumor (HCC)    "I still have it"; not cancer (11/14/2017)   Chest pain, atypical 09/11/2021   Chronic lower back pain    Community acquired pneumonia 02/14/2023   Coronary artery disease    Dr. Algie Coffer   Elevated CK 11/15/2017   GERD (gastroesophageal reflux disease)    History of blood transfusion    "don't remember why" (11/14/2017)   History of radiation therapy 07/03/2018   brain, pituitary/ 12.5 Gy in 1 fraction   Hypercholesteremia    Hypertension    Influenza A 11/14/2017   MI (myocardial infarction) (HCC) 2006   PONV (postoperative nausea and vomiting)    Skin cancer    "right forearm"    Assessment: Adriana Jordan is a 89 y.o. year old female admitted on 11/08/2023 with acute respiratory failure and concern for PE. No anticoagulation prior to admission. Pharmacy consulted to dose heparin.  Goal of Therapy:  Heparin level 0.3-0.7 units/ml Monitor platelets by anticoagulation protocol: Yes   Plan:  Heparin 4500 units x 1 as bolus followed by heparin infusion at 1200 units/hr 8 heparin  level  Daily heparin level, CBC, and monitoring for bleeding F/u plans for anticoagulation and PE work up   Thank you for allowing pharmacy to participate in this patient's care.  Marja Kays, PharmD Emergency Medicine Clinical Pharmacist 11/08/2023,11:34 PM

## 2023-11-09 ENCOUNTER — Inpatient Hospital Stay (HOSPITAL_COMMUNITY)

## 2023-11-09 DIAGNOSIS — R55 Syncope and collapse: Secondary | ICD-10-CM | POA: Diagnosis not present

## 2023-11-09 DIAGNOSIS — M7989 Other specified soft tissue disorders: Secondary | ICD-10-CM

## 2023-11-09 DIAGNOSIS — Z7189 Other specified counseling: Secondary | ICD-10-CM

## 2023-11-09 DIAGNOSIS — J9601 Acute respiratory failure with hypoxia: Secondary | ICD-10-CM

## 2023-11-09 DIAGNOSIS — N179 Acute kidney failure, unspecified: Secondary | ICD-10-CM

## 2023-11-09 DIAGNOSIS — Z515 Encounter for palliative care: Secondary | ICD-10-CM | POA: Diagnosis not present

## 2023-11-09 DIAGNOSIS — Z66 Do not resuscitate: Secondary | ICD-10-CM | POA: Diagnosis not present

## 2023-11-09 DIAGNOSIS — T68XXXA Hypothermia, initial encounter: Secondary | ICD-10-CM

## 2023-11-09 LAB — COMPREHENSIVE METABOLIC PANEL WITH GFR
ALT: 19 U/L (ref 0–44)
AST: 32 U/L (ref 15–41)
Albumin: 3.9 g/dL (ref 3.5–5.0)
Alkaline Phosphatase: 55 U/L (ref 38–126)
Anion gap: 11 (ref 5–15)
BUN: 22 mg/dL (ref 8–23)
CO2: 25 mmol/L (ref 22–32)
Calcium: 9.4 mg/dL (ref 8.9–10.3)
Chloride: 101 mmol/L (ref 98–111)
Creatinine, Ser: 1.59 mg/dL — ABNORMAL HIGH (ref 0.44–1.00)
GFR, Estimated: 31 mL/min — ABNORMAL LOW (ref >60)
Glucose, Bld: 102 mg/dL — ABNORMAL HIGH (ref 70–99)
Potassium: 3.7 mmol/L (ref 3.5–5.1)
Sodium: 137 mmol/L (ref 135–145)
Total Bilirubin: 1 mg/dL (ref 0.0–1.2)
Total Protein: 6.6 g/dL (ref 6.5–8.1)

## 2023-11-09 LAB — CBC
HCT: 39.2 % (ref 36.0–46.0)
Hemoglobin: 12.1 g/dL (ref 12.0–15.0)
MCH: 30.1 pg (ref 26.0–34.0)
MCHC: 30.9 g/dL (ref 30.0–36.0)
MCV: 97.5 fL (ref 80.0–100.0)
Platelets: 233 10*3/uL (ref 150–400)
RBC: 4.02 MIL/uL (ref 3.87–5.11)
RDW: 15.9 % — ABNORMAL HIGH (ref 11.5–15.5)
WBC: 5.1 10*3/uL (ref 4.0–10.5)
nRBC: 0 % (ref 0.0–0.2)

## 2023-11-09 LAB — TROPONIN I (HIGH SENSITIVITY)
Troponin I (High Sensitivity): 652 ng/L (ref <18)
Troponin I (High Sensitivity): 691 ng/L
Troponin I (High Sensitivity): 754 ng/L (ref <18)
Troponin I (High Sensitivity): 529 ng/L (ref <18)
Troponin I (High Sensitivity): 233 ng/L
Troponin I (High Sensitivity): 270 ng/L (ref <18)

## 2023-11-09 LAB — ECHOCARDIOGRAM COMPLETE
AR max vel: 1.52 cm2
AV Area VTI: 1.56 cm2
AV Area mean vel: 1.39 cm2
AV Mean grad: 5.5 mmHg
AV Peak grad: 9.6 mmHg
Ao pk vel: 1.55 m/s
Area-P 1/2: 3.27 cm2
Height: 66 in
S' Lateral: 2.7 cm
Weight: 2640 [oz_av]

## 2023-11-09 LAB — PROCALCITONIN: Procalcitonin: 0.1 ng/mL

## 2023-11-09 LAB — HEPARIN LEVEL (UNFRACTIONATED)
Heparin Unfractionated: 1.09 [IU]/mL — ABNORMAL HIGH (ref 0.30–0.70)
Heparin Unfractionated: 0.63 [IU]/mL (ref 0.30–0.70)

## 2023-11-09 MED ORDER — HYDROCORTISONE 20 MG PO TABS
20.0000 mg | ORAL_TABLET | ORAL | Status: DC
  Administered 2023-11-09 – 2023-11-11 (×2): 20 mg via ORAL
  Filled 2023-11-09 (×3): qty 1

## 2023-11-09 MED ORDER — LORATADINE 10 MG PO TABS
10.0000 mg | ORAL_TABLET | Freq: Every day | ORAL | Status: DC
  Administered 2023-11-09 – 2023-11-10 (×2): 10 mg via ORAL
  Filled 2023-11-09 (×2): qty 1

## 2023-11-09 MED ORDER — ROSUVASTATIN CALCIUM 20 MG PO TABS
20.0000 mg | ORAL_TABLET | Freq: Every evening | ORAL | Status: DC
  Administered 2023-11-09 – 2023-11-10 (×2): 20 mg via ORAL
  Filled 2023-11-09 (×2): qty 1

## 2023-11-09 MED ORDER — ACETAMINOPHEN 650 MG RE SUPP: 650.0000 mg | Freq: Four times a day (QID) | RECTAL | Status: DC | PRN

## 2023-11-09 MED ORDER — HEPARIN (PORCINE) 25000 UT/250ML-% IV SOLN
850.0000 [IU]/h | INTRAVENOUS | Status: AC
  Administered 2023-11-09: 1000 [IU]/h via INTRAVENOUS
  Filled 2023-11-09: qty 250

## 2023-11-09 MED ORDER — TECHNETIUM TO 99M ALBUMIN AGGREGATED
4.3000 | Freq: Once | INTRAVENOUS | Status: AC | PRN
  Administered 2023-11-09: 4.3 via INTRAVENOUS

## 2023-11-09 MED ORDER — ACETAMINOPHEN 325 MG PO TABS: 650.0000 mg | ORAL_TABLET | Freq: Four times a day (QID) | ORAL | Status: DC | PRN

## 2023-11-09 MED ORDER — HYDROCORTISONE 10 MG PO TABS
10.0000 mg | ORAL_TABLET | Freq: Every evening | ORAL | Status: DC
  Administered 2023-11-09 – 2023-11-10 (×2): 10 mg via ORAL
  Filled 2023-11-09 (×2): qty 1

## 2023-11-09 MED ORDER — SODIUM CHLORIDE 0.9 % IV SOLN: INTRAVENOUS | Status: AC

## 2023-11-09 MED ORDER — ENSURE ENLIVE PO LIQD
237.0000 mL | Freq: Two times a day (BID) | ORAL | Status: DC
  Administered 2023-11-09 – 2023-11-10 (×3): 237 mL via ORAL

## 2023-11-09 NOTE — Consult Note (Signed)
 Referring Physician: Family request  DIETRA Jordan is an 89 y.o. female.                       Chief Complaint: Syncope, shortness of breath and abnormal troponin I  HPI: 89 years old black female with PMH of Adrenal insufficiency, pituitary adenoma- s/p surgery and radiation Tx, HTN, HLD and CAD had episode of weakness, syncope, shortness of breath and hypotension. In ER she was also hypoxic and hypothermic.  VQ scan is positive for pulmonary embolism and Venous doppler is positive for right and left leg  DVT. She is already on IV heparin. Her troponin I are mildly elevated consistent with pulmonary embolism and infarct. CBC is near normal and CMET shows CKD IIIb otherwise stable.  Past Medical History:  Diagnosis Date   Acute respiratory failure with hypoxia (HCC) 11/15/2017   AKI (acute kidney injury) (HCC) 11/15/2017   Arthritis    "all over" (11/14/2017)   Brain tumor (HCC)    "I still have it"; not cancer (11/14/2017)   Chest pain, atypical 09/11/2021   Chronic lower back pain    Community acquired pneumonia 02/14/2023   Coronary artery disease    Dr. Algie Coffer   Elevated CK 11/15/2017   GERD (gastroesophageal reflux disease)    History of blood transfusion    "don't remember why" (11/14/2017)   History of radiation therapy 07/03/2018   brain, pituitary/ 12.5 Gy in 1 fraction   Hypercholesteremia    Hypertension    Influenza A 11/14/2017   MI (myocardial infarction) (HCC) 2006   PONV (postoperative nausea and vomiting)    Skin cancer    "right forearm"      Past Surgical History:  Procedure Laterality Date   ANTERIOR CERVICAL DECOMP/DISCECTOMY FUSION     BACK SURGERY     CARPAL TUNNEL RELEASE Bilateral    CORONARY ANGIOPLASTY     DES mid LAD and mid RCA 09/21/04   CRANIOTOMY N/A 05/21/2015   Procedure: Transsphenoidal resection of pituitary tumor with Dr. Narda Bonds for approach;  Surgeon: Hilda Lias, MD;  Location: [facility] NEURO ORS;  Service: Neurosurgery;   Laterality: N/A;  Transsphenoidal resection of pituitary tumor with Dr. Narda Bonds for approach   SHOULDER OPEN ROTATOR CUFF REPAIR Bilateral    SKIN CANCER EXCISION Right    forearm   thumb surgery Right    placed srews to make straight   THYROID SURGERY     "goiter removed"    Family History  Problem Relation Age of Onset   Cancer Father 60       throat- smoker    Breast cancer Sister 88   Cancer Brother 57       lung- smoker   Social History:  reports that she quit smoking about 54 years ago. Her smoking use included cigarettes. She started smoking about 59 years ago. She has a 0.6 pack-year smoking history. She has never used smokeless tobacco. She reports that she does not currently use alcohol. She reports that she does not use drugs.  Allergies:  Allergies  Allergen Reactions   Codeine Nausea And Vomiting    Tolerates hydrocodone    (Not in a hospital admission)   Results for orders placed or performed during the hospital encounter of 11/08/23 (from the past 48 hours)  Resp panel by RT-PCR (RSV, Flu A&B, Covid) Anterior Nasal Swab     Status: None   Collection Time: 11/08/23  5:51 PM  Specimen: Anterior Nasal Swab  Result Value Ref Range   SARS Coronavirus 2 by RT PCR NEGATIVE NEGATIVE   Influenza A by PCR NEGATIVE NEGATIVE   Influenza B by PCR NEGATIVE NEGATIVE    Comment: (NOTE) The Xpert Xpress SARS-CoV-2/FLU/RSV plus assay is intended as an aid in the diagnosis of influenza from Nasopharyngeal swab specimens and should not be used as a sole basis for treatment. Nasal washings and aspirates are unacceptable for Xpert Xpress SARS-CoV-2/FLU/RSV testing.  Fact Sheet for Patients: BloggerCourse.com  Fact Sheet for Healthcare Providers: SeriousBroker.it  This test is not yet approved or cleared by the [place] FDA and has been authorized for detection and/or diagnosis of SARS-CoV-2 by FDA under an  Emergency Use Authorization (EUA). This EUA will remain in effect (meaning this test can be used) for the duration of the COVID-19 declaration under Section 564(b)(1) of the Act, 21 U.S.C. section 360bbb-3(b)(1), unless the authorization is terminated or revoked.     Resp Syncytial Virus by PCR NEGATIVE NEGATIVE    Comment: (NOTE) Fact Sheet for Patients: BloggerCourse.com  Fact Sheet for Healthcare Providers: SeriousBroker.it  This test is not yet approved or cleared by the [place] FDA and has been authorized for detection and/or diagnosis of SARS-CoV-2 by FDA under an Emergency Use Authorization (EUA). This EUA will remain in effect (meaning this test can be used) for the duration of the COVID-19 declaration under Section 564(b)(1) of the Act, 21 U.S.C. section 360bbb-3(b)(1), unless the authorization is terminated or revoked.  Performed at [facility], [address], [place], [place] [address]   Blood Culture (routine x 2)     Status: None (Preliminary result)   Collection Time: 11/08/23  6:10 PM   Specimen: BLOOD  Result Value Ref Range   Specimen Description BLOOD LEFT ANTECUBITAL    Special Requests      BOTTLES DRAWN AEROBIC AND ANAEROBIC Blood Culture adequate volume   Culture      NO GROWTH < 12 HOURS Performed at [facility], [address] [place], [place] [address]    Report Status PENDING   Comprehensive metabolic panel     Status: Abnormal   Collection Time: 11/08/23  6:12 PM  Result Value Ref Range   Sodium 137 135 - 145 mmol/L   Potassium 4.1 3.5 - 5.1 mmol/L   Chloride 97 (L) 98 - 111 mmol/L   CO2 23 22 - 32 mmol/L   Glucose, Bld 120 (H) 70 - 99 mg/dL    Comment: Glucose reference range applies only to samples taken after fasting for at least 8 hours.   BUN 24 (H) 8 - 23 mg/dL   Creatinine, Ser 6.29 (H) 0.44 - 1.00 mg/dL   Calcium 9.8 8.9 - 52.8 mg/dL   Total Protein 6.9 6.5 - 8.1  g/dL   Albumin 4.3 3.5 - 5.0 g/dL   AST 52 (H) 15 - 41 U/L   ALT 27 0 - 44 U/L   Alkaline Phosphatase 59 38 - 126 U/L   Total Bilirubin 0.6 0.0 - 1.2 mg/dL   GFR, Estimated 28 (L) >60 mL/min    Comment: (NOTE) Calculated using the CKD-EPI Creatinine Equation (2021)    Anion gap 17 (H) 5 - 15    Comment: Performed at [facility], [address] [place], [place] [address]  CBC with Differential     Status: Abnormal   Collection Time: 11/08/23  6:12 PM  Result Value Ref Range  WBC 5.3 4.0 - 10.5 K/uL   RBC 3.95 3.87 - 5.11 MIL/uL   Hemoglobin 12.1 12.0 - 15.0 g/dL   HCT 96.0 45.4 - 09.8 %   MCV 96.7 80.0 - 100.0 fL   MCH 30.6 26.0 - 34.0 pg   MCHC 31.7 30.0 - 36.0 g/dL   RDW 11.9 (H) 14.7 - 82.9 %   Platelets 279 150 - 400 K/uL   nRBC 0.0 0.0 - 0.2 %   Neutrophils Relative % 57 %   Neutro Abs 3.0 1.7 - 7.7 K/uL   Lymphocytes Relative 30 %   Lymphs Abs 1.6 0.7 - 4.0 K/uL   Monocytes Relative 9 %   Monocytes Absolute 0.5 0.1 - 1.0 K/uL   Eosinophils Relative 3 %   Eosinophils Absolute 0.2 0.0 - 0.5 K/uL   Basophils Relative 1 %   Basophils Absolute 0.0 0.0 - 0.1 K/uL   Immature Granulocytes 0 %   Abs Immature Granulocytes 0.02 0.00 - 0.07 K/uL    Comment: Performed at [facility], [address], [place], [place] [address]  Protime-INR     Status: None   Collection Time: 11/08/23  6:12 PM  Result Value Ref Range   Prothrombin Time 12.7 11.4 - 15.2 seconds   INR 0.9 0.8 - 1.2    Comment: (NOTE) INR goal varies based on device and disease states. Performed at [facility], [address], [place], [place] [address]   APTT     Status: None   Collection Time: 11/08/23  6:12 PM  Result Value Ref Range   aPTT 26 24 - 36 seconds    Comment: Performed at [facility], [address] [place], [place] [address]  Brain natriuretic peptide     Status: None   Collection Time: 11/08/23  6:12 PM  Result Value Ref Range   B Natriuretic Peptide 14.9 0.0  - 100.0 pg/mL    Comment: Performed at [facility], [address] [place], [place] [address]  D-dimer, quantitative     Status: Abnormal   Collection Time: 11/08/23  6:12 PM  Result Value Ref Range   D-Dimer, Quant 9.95 (H) 0.00 - 0.50 ug/mL-FEU    Comment: (NOTE) At the manufacturer cut-off value of 0.5 g/mL FEU, this assay has a negative predictive value of 95-100%.This assay is intended for use in conjunction with a clinical pretest probability (PTP) assessment model to exclude pulmonary embolism (PE) and deep venous thrombosis (DVT) in outpatients suspected of PE or DVT. Results should be correlated with clinical presentation. Performed at [facility], [address], [place], [place] [address]   I-Stat Lactic Acid, ED     Status: Abnormal   Collection Time: 11/08/23  6:28 PM  Result Value Ref Range   Lactic Acid, Venous 2.5 (HH) 0.5 - 1.9 mmol/L   Comment NOTIFIED PHYSICIAN   Blood Culture (routine x 2)     Status: None (Preliminary result)   Collection Time: 11/08/23 10:11 PM   Specimen: BLOOD  Result Value Ref Range   Specimen Description BLOOD BLOOD LEFT ARM    Special Requests      BOTTLES DRAWN AEROBIC AND ANAEROBIC Blood Culture results may not be optimal due to an inadequate volume of blood received in culture bottles   Culture      NO GROWTH < 12 HOURS Performed at [facility], [address] [place], [place] [address]    Report  Status PENDING   I-Stat Lactic Acid, ED     Status: None   Collection Time: 11/08/23 10:20 PM  Result Value Ref Range   Lactic Acid, Venous 1.3 0.5 - 1.9 mmol/L  CBC     Status: Abnormal   Collection Time: 11/09/23  2:45 AM  Result Value Ref Range   WBC 5.1 4.0 - 10.5 K/uL   RBC 4.02 3.87 - 5.11 MIL/uL   Hemoglobin 12.1 12.0 - 15.0 g/dL   HCT 16.1 09.6 - 04.5 %   MCV 97.5 80.0 - 100.0 fL   MCH 30.1 26.0 - 34.0 pg   MCHC 30.9 30.0 - 36.0 g/dL   RDW 40.9 (H) 81.1 - 91.4 %   Platelets 233 150 - 400 K/uL    nRBC 0.0 0.0 - 0.2 %    Comment: Performed at [facility], [address] [place], [place] [address]  Comprehensive metabolic panel     Status: Abnormal   Collection Time: 11/09/23  2:45 AM  Result Value Ref Range   Sodium 137 135 - 145 mmol/L   Potassium 3.7 3.5 - 5.1 mmol/L   Chloride 101 98 - 111 mmol/L   CO2 25 22 - 32 mmol/L   Glucose, Bld 102 (H) 70 - 99 mg/dL    Comment: Glucose reference range applies only to samples taken after fasting for at least 8 hours.   BUN 22 8 - 23 mg/dL   Creatinine, Ser 6.21 (H) 0.44 - 1.00 mg/dL   Calcium 9.4 8.9 - 30.8 mg/dL   Total Protein 6.6 6.5 - 8.1 g/dL   Albumin 3.9 3.5 - 5.0 g/dL   AST 32 15 - 41 U/L   ALT 19 0 - 44 U/L   Alkaline Phosphatase 55 38 - 126 U/L   Total Bilirubin 1.0 0.0 - 1.2 mg/dL   GFR, Estimated 31 (L) >60 mL/min    Comment: (NOTE) Calculated using the CKD-EPI Creatinine Equation (2021)    Anion gap 11 5 - 15    Comment: Performed at [facility], [address] [place], [place] [address]  Procalcitonin     Status: None   Collection Time: 11/09/23  2:45 AM  Result Value Ref Range   Procalcitonin 0.10 ng/mL    Comment:        Interpretation: PCT (Procalcitonin) <= 0.5 ng/mL: Systemic infection (sepsis) is not likely. Local bacterial infection is possible. (NOTE)       Sepsis PCT Algorithm           Lower Respiratory Tract                                      Infection PCT Algorithm    ----------------------------     ----------------------------         PCT < 0.25 ng/mL                PCT < 0.10 ng/mL          Strongly encourage             Strongly discourage   discontinuation of antibiotics    initiation of antibiotics    ----------------------------     -----------------------------       PCT 0.25 - 0.50 ng/mL            PCT 0.10 - 0.25 ng/mL  OR       >80% decrease in PCT            Discourage initiation of                                            antibiotics      Encourage  discontinuation           of antibiotics    ----------------------------     -----------------------------         PCT >= 0.50 ng/mL              PCT 0.26 - 0.50 ng/mL               AND        <80% decrease in PCT             Encourage initiation of                                             antibiotics       Encourage continuation           of antibiotics    ----------------------------     -----------------------------        PCT >= 0.50 ng/mL                  PCT > 0.50 ng/mL               AND         increase in PCT                  Strongly encourage                                      initiation of antibiotics    Strongly encourage escalation           of antibiotics                                     -----------------------------                                           PCT <= 0.25 ng/mL                                                 OR                                        > 80% decrease in PCT                                      Discontinue / Do not initiate  antibiotics  Performed at [facility], [address], [place], [place] [address]   Troponin I (High Sensitivity)     Status: Abnormal   Collection Time: 11/09/23  2:45 AM  Result Value Ref Range   Troponin I (High Sensitivity) 652 (HH) <18 ng/L    Comment: CRITICAL RESULT CALLED TO, READ BACK BY AND VERIFIED WITH C. GROSH, RN AT [phone] 04.02.2025 JLASIGAN (NOTE) Elevated high sensitivity troponin I (hsTnI) values and significant  changes across serial measurements may suggest ACS but many other  chronic and acute conditions are known to elevate hsTnI results.  Refer to the "Links" section for chest pain algorithms and additional  guidance. Performed at [facility], [address] [place], [place] [address]   Troponin I (High Sensitivity)     Status: Abnormal   Collection Time: 11/09/23  5:11 AM  Result Value Ref Range   Troponin I (High  Sensitivity) 691 (HH) <18 ng/L    Comment: CRITICAL VALUE NOTED. VALUE IS CONSISTENT WITH PREVIOUSLY REPORTED/CALLED VALUE DELTA CHECK NOTED (NOTE) Elevated high sensitivity troponin I (hsTnI) values and significant  changes across serial measurements may suggest ACS but many other  chronic and acute conditions are known to elevate hsTnI results.  Refer to the "Links" section for chest pain algorithms and additional  guidance. Performed at [facility], [address], [place], [place] [address]   Heparin level (unfractionated)     Status: Abnormal   Collection Time: 11/09/23  7:44 AM  Result Value Ref Range   Heparin Unfractionated 1.09 (H) 0.30 - 0.70 IU/mL    Comment: (NOTE) The clinical reportable range upper limit is being lowered to >1.10 to align with the FDA approved guidance for the current laboratory assay.  If heparin results are below expected values, and patient dosage has  been confirmed, suggest follow up testing of antithrombin III levels. Performed at [facility], [address], [place], [place] [address]   Troponin I (High Sensitivity)     Status: Abnormal   Collection Time: 11/09/23  7:44 AM  Result Value Ref Range   Troponin I (High Sensitivity) 754 (HH) <18 ng/L    Comment: DELTA CHECK NOTED CRITICAL VALUE NOTED. VALUE IS CONSISTENT WITH PREVIOUSLY REPORTED/CALLED VALUE (NOTE) Elevated high sensitivity troponin I (hsTnI) values and significant  changes across serial measurements may suggest ACS but many other  chronic and acute conditions are known to elevate hsTnI results.  Refer to the "Links" section for chest pain algorithms and additional  guidance. Performed at [facility], [address] [place], [place] [address]   Troponin I (High Sensitivity)     Status: Abnormal   Collection Time: 11/09/23  9:25 AM  Result Value Ref Range   Troponin I (High Sensitivity) 529 (HH) <18 ng/L    Comment: CRITICAL VALUE NOTED. VALUE IS  CONSISTENT WITH PREVIOUSLY REPORTED/CALLED VALUE (NOTE) Elevated high sensitivity troponin I (hsTnI) values and significant  changes across serial measurements may suggest ACS but many other  chronic and acute conditions are known to elevate hsTnI results.  Refer to the "Links" section for chest pain algorithms and additional  guidance. Performed at [facility], [address], [place], [place] [address]   Troponin I (High Sensitivity)     Status: Abnormal   Collection Time: 11/09/23 12:40 PM  Result Value Ref Range   Troponin I (High Sensitivity) 233 (HH) <18 ng/L    Comment: CRITICAL VALUE  NOTED. VALUE IS CONSISTENT WITH PREVIOUSLY REPORTED/CALLED VALUE (NOTE) Elevated high sensitivity troponin I (hsTnI) values and significant  changes across serial measurements may suggest ACS but many other  chronic and acute conditions are known to elevate hsTnI results.  Refer to the "Links" section for chest pain algorithms and additional  guidance. Performed at [facility], [address] [place], [place] [address]    ECHOCARDIOGRAM COMPLETE Result Date: 11/09/2023    ECHOCARDIOGRAM REPORT   Patient Name:   Adriana Jordan Date of Exam: 11/09/2023 Medical Rec #:  [id number]         Height:       66.0 in Accession #:    [id number]        Weight:       165.0 lb Date of Birth:  [birth year]         BSA:          1.843 m Patient Age:    89 years          BP:           113/66 mmHg Patient Gender: F                 HR:           75 bpm. Exam Location:  Inpatient Procedure: 2D Echo, Cardiac Doppler and Color Doppler (Both Spectral and Color            Flow Doppler were utilized during procedure). Indications:     Elevated Troponin  History:         Patient has prior history of Echocardiogram examinations, most                  recent 07/31/2023. CAD; Risk Factors:Hypertension and                  Dyslipidemia.  Sonographer:     Geoffery Spruce, FE, PE Referring Phys:  [id number] Tereasa Coop  Diagnosing Phys: Orpah Cobb MD  Sonographer Comments: Suboptimal parasternal window and Technically difficult study due to poor echo windows. IMPRESSIONS  1. Left ventricular ejection fraction, by estimation, is 50 to 55%. The left ventricle has low normal function. The left ventricle has no regional wall motion abnormalities. Left ventricular diastolic parameters are consistent with Grade I diastolic dysfunction (impaired relaxation).  2. Right ventricular systolic function is mildly reduced. The right ventricular size is normal.  3. Left atrial size was mildly dilated.  4. The mitral valve is degenerative. Mild mitral valve regurgitation. Moderate mitral annular calcification.  5. The aortic valve is tricuspid. There is moderate calcification of the aortic valve. There is mild thickening of the aortic valve. Aortic valve regurgitation is mild. Mild aortic valve stenosis.  6. The inferior vena cava is normal in size with greater than 50% respiratory variability, suggesting right atrial pressure of 3 mmHg. FINDINGS  Left Ventricle: Left ventricular ejection fraction, by estimation, is 50 to 55%. The left ventricle has low normal function. The left ventricle has no regional wall motion abnormalities. The left ventricular internal cavity size was normal in size. There is borderline concentric left ventricular hypertrophy. Left ventricular diastolic parameters are consistent with Grade I diastolic dysfunction (impaired relaxation). Right Ventricle: The right ventricular size is normal. No increase in right ventricular wall thickness. Right ventricular systolic function is mildly reduced. Left Atrium: Left atrial size was mildly dilated. Right Atrium: Right atrial size was normal in size. Pericardium: There is no evidence of  pericardial effusion. Mitral Valve: The mitral valve is degenerative in appearance. There is mild thickening of the mitral valve leaflet(s). Moderate mitral annular calcification. Mild mitral  valve regurgitation. Tricuspid Valve: The tricuspid valve is normal in structure. Tricuspid valve regurgitation is trivial. Aortic Valve: The aortic valve is tricuspid. There is moderate calcification of the aortic valve. There is mild thickening of the aortic valve. There is mild to moderate aortic valve annular calcification. Aortic valve regurgitation is mild. Mild aortic stenosis is present. Aortic valve mean gradient measures 5.5 mmHg. Aortic valve peak gradient measures 9.6 mmHg. Aortic valve area, by VTI measures 1.56 cm. Pulmonic Valve: The pulmonic valve was normal in structure. Pulmonic valve regurgitation is not visualized. Aorta: The aortic root is normal in size and structure. Venous: The inferior vena cava is normal in size with greater than 50% respiratory variability, suggesting right atrial pressure of 3 mmHg. IAS/Shunts: The atrial septum is grossly normal.  LEFT VENTRICLE PLAX 2D LVIDd:         3.70 cm   Diastology LVIDs:         2.70 cm   LV e' medial:    5.75 cm/s LV PW:         1.00 cm   LV E/e' medial:  9.6 LV IVS:        1.00 cm   LV e' lateral:   5.44 cm/s LVOT diam:     2.00 cm   LV E/e' lateral: 10.1 LV SV:         45 LV SV Index:   24 LVOT Area:     3.14 cm  RIGHT VENTRICLE RV S prime:     11.20 cm/s LEFT ATRIUM             Index        RIGHT ATRIUM           Index LA Vol (A2C):   41.6 ml 22.57 ml/m  RA Area:     10.30 cm LA Vol (A4C):   29.7 ml 16.12 ml/m  RA Volume:   19.60 ml  10.64 ml/m LA Biplane Vol: 35.0 ml 18.99 ml/m  AORTIC VALVE AV Area (Vmax):    1.52 cm AV Area (Vmean):   1.39 cm AV Area (VTI):     1.56 cm AV Vmax:           155.00 cm/s AV Vmean:          108.000 cm/s AV VTI:            0.288 m AV Peak Grad:      9.6 mmHg AV Mean Grad:      5.5 mmHg LVOT Vmax:         75.00 cm/s LVOT Vmean:        47.900 cm/s LVOT VTI:          0.143 m LVOT/AV VTI ratio: 0.50  AORTA Ao Root diam: 2.90 cm MITRAL VALVE MV Area (PHT): 3.27 cm    SHUNTS MV Decel Time: 232 msec     Systemic VTI:  0.14 m MV E velocity: 55.20 cm/s  Systemic Diam: 2.00 cm MV A velocity: 85.40 cm/s MV E/A ratio:  0.65 Orpah Cobb MD Electronically signed by Orpah Cobb MD Signature Date/Time: 11/09/2023/3:35:41 PM    Final    VAS [place] LOWER EXTREMITY VENOUS (DVT) Result Date: 11/09/2023  Lower Venous DVT Study Patient Name:  ELLESSE ANTENUCCI  Date of Exam:   11/09/2023 Medical Rec #:  [id number]          Accession #:    [id number] Date of Birth: 1935/07/19          Patient Gender: F Patient Age:   25 years Exam Location:  [facility] Procedure:      VAS [place] LOWER EXTREMITY VENOUS (DVT) Referring Phys: Eulah Pont SUNDIL --------------------------------------------------------------------------------  Indications: Swelling, Edema, SOB, and stroke.  Anticoagulation: Plavix. Limitations: Involuntary patient movement. Comparison Study: No prior exam. Performing Technologist: Fernande Bras  Examination Guidelines: A complete evaluation includes B-mode imaging, spectral Doppler, color Doppler, and power Doppler as needed of all accessible portions of each vessel. Bilateral testing is considered an integral part of a complete examination. Limited examinations for reoccurring indications may be performed as noted. The reflux portion of the exam is performed with the patient in reverse Trendelenburg.  +---------+---------------+---------+-----------+----------+--------------+ RIGHT    CompressibilityPhasicitySpontaneityPropertiesThrombus Aging +---------+---------------+---------+-----------+----------+--------------+ CFV      Partial        Yes      Yes                                 +---------+---------------+---------+-----------+----------+--------------+ SFJ      None           No       Yes                                 +---------+---------------+---------+-----------+----------+--------------+ FV Prox  None           No       No                                   +---------+---------------+---------+-----------+----------+--------------+ FV Mid   Partial        Yes      Yes                                 +---------+---------------+---------+-----------+----------+--------------+ FV DistalNone           No       No                                  +---------+---------------+---------+-----------+----------+--------------+ PFV      None           Yes      Yes                                 +---------+---------------+---------+-----------+----------+--------------+ POP      None           No       No                                  +---------+---------------+---------+-----------+----------+--------------+ PTV      Partial        No       No                                  +---------+---------------+---------+-----------+----------+--------------+  PERO     Partial        No       No                                  +---------+---------------+---------+-----------+----------+--------------+ EIV                     Yes      Yes                                 +---------+---------------+---------+-----------+----------+--------------+   +---------+---------------+---------+-----------+----------+--------------+ LEFT     CompressibilityPhasicitySpontaneityPropertiesThrombus Aging +---------+---------------+---------+-----------+----------+--------------+ CFV      Full           Yes      Yes                                 +---------+---------------+---------+-----------+----------+--------------+ SFJ      Full           Yes      Yes                                 +---------+---------------+---------+-----------+----------+--------------+ FV Prox  Full                                                        +---------+---------------+---------+-----------+----------+--------------+ FV Mid   Full                                                         +---------+---------------+---------+-----------+----------+--------------+ FV DistalFull                                                        +---------+---------------+---------+-----------+----------+--------------+ PFV      Full                                                        +---------+---------------+---------+-----------+----------+--------------+ POP      Full           Yes      Yes                                 +---------+---------------+---------+-----------+----------+--------------+ PTV      Full                                                        +---------+---------------+---------+-----------+----------+--------------+  PERO     None           No       No                                  +---------+---------------+---------+-----------+----------+--------------+    Summary: RIGHT: - Findings consistent with acute deep vein thrombosis involving the right common femoral vein, SF junction, right proximal profunda vein, right femoral vein, right popliteal vein, right posterior tibial veins, and right peroneal veins.  - No cystic structure found in the popliteal fossa.  LEFT: - Findings consistent with acute deep vein thrombosis involving the left peroneal veins.  - No cystic structure found in the popliteal fossa.  *See table(s) above for measurements and observations.    Preliminary    NM Pulmonary Perfusion Addendum Date: 11/09/2023 ADDENDUM REPORT: 11/09/2023 11:32 ADDENDUM: Critical Value/emergent results were called by telephone at the time of interpretation on 11/09/2023 at 11:32 am to provider Dr. Jerral Ralph, who verbally acknowledged these results. Electronically Signed   By: Kennith Center M.D.   On: 11/09/2023 11:32   Result Date: 11/09/2023 CLINICAL DATA:  Acute hypoxic respiratory failure. Elevated D-dimer. EXAM: NUCLEAR MEDICINE PERFUSION LUNG SCAN TECHNIQUE: Perfusion images were obtained in multiple projections after intravenous injection of  radiopharmaceutical. Ventilation scans intentionally deferred if perfusion scan and chest x-ray adequate for interpretation during COVID 19 epidemic. RADIOPHARMACEUTICALS:  4.3 mCi Tc-43m MAA IV COMPARISON:  Chest x-ray 11/08/2023. Previous nuclear medicine pulmonary perfusion study 09/12/2023. FINDINGS: Since the previous perfusion study of 2 months ago, the patient has developed bilateral segmental wedge-shaped peripheral perfusion defects in both lungs. Review of yesterday's chest x-ray reveals no pulmonary opacities in these regions. IMPRESSION: Imaging features consistent with pulmonary embolism present. Perfusion findings are new since prior study of 09/12/2023. Electronically Signed: By: Kennith Center M.D. On: 11/09/2023 11:18   CT HEAD WO CONTRAST ( ) Result Date: 11/08/2023 CLINICAL DATA:  Syncope EXAM: CT HEAD WITHOUT CONTRAST TECHNIQUE: Contiguous axial images were obtained from the base of the skull through the vertex without intravenous contrast. RADIATION DOSE REDUCTION: This exam was performed according to the departmental dose-optimization program which includes automated exposure control, adjustment of the mA and/or kV according to patient size and/or use of iterative reconstruction technique. COMPARISON:  10/01/2023 FINDINGS: Brain: Normal anatomic configuration. Parenchymal volume loss is commensurate with the patient's age. Stable moderate periventricular white matter changes are present likely reflecting the sequela of small vessel ischemia. No abnormal intra or extra-axial mass lesion or fluid collection. No abnormal mass effect or midline shift. No evidence of acute intracranial hemorrhage or infarct. Ventricular size is normal. Cerebellum unremarkable. Vascular: No asymmetric hyperdense vasculature at the skull base. Skull: Intact Sinuses/Orbits: Paranasal sinuses are clear. Orbits are unremarkable. Other: Mastoid air cells and middle ear cavities are clear. IMPRESSION: 1. No acute  intracranial hemorrhage or infarct. 2. Stable senescent change. Electronically Signed   By: Helyn Numbers M.D.   On: 11/08/2023 23:19   DG Chest Port 1 View Result Date: 11/08/2023 CLINICAL DATA:  Sepsis.  Low O2 sat EXAM: PORTABLE CHEST 1 VIEW COMPARISON:  X-ray 10/01/2023 and older FINDINGS: Hyperinflation. Chronic lung changes. There is some bandlike changes at the left lung base. Increased from previous. Favor atelectasis. Recommend follow-up. No pneumothorax, effusion or edema. Normal cardiopericardial silhouette calcified aorta. Calcified tracheobronchial tree. Fixation hardware at the edge of the imaging field along the lower cervical spine. IMPRESSION:  Hyperinflation with chronic changes. Increasing bandlike opacity left lung base such as atelectasis. Electronically Signed   By: Karen Kays M.D.   On: 11/08/2023 18:51    Review Of Systems Constitutional: No fever, chills, weight loss or gain. Eyes: No vision change, wears glasses. No discharge or pain. Ears: No hearing loss, No tinnitus. Respiratory: No asthma, COPD, pneumonias. Positive shortness of breath. No hemoptysis. Cardiovascular: H/O chest pain, palpitation, leg edema. Gastrointestinal: No nausea, vomiting, diarrhea, constipation. No GI bleed. No hepatitis. Genitourinary: No dysuria, hematuria, kidney stone. No incontinance. Neurological: No headache, stroke, seizures.  Psychiatry: No psych facility admission for anxiety, depression, suicide. No detox. Skin: No rash. Musculoskeletal: Positive joint pain, fibromyalgia, neck pain, back pain. Lymphadenopathy: No lymphadenopathy. Hematology: No anemia or easy bruising.   Blood pressure 119/70, pulse 83, temperature 97.7 F (36.5 C), temperature source Oral, resp. rate 14, height 5\' 6"  (1.676 m), weight 74.8 kg, SpO2 97%. Body mass index is 26.63 kg/m. General appearance: alert, cooperative, appears stated age and no distress Head: Normocephalic, atraumatic. Eyes:  Brown/Blue/Davisha eyes, pink conjunctiva, corneas clear. PERRL, EOM's intact. Neck: No adenopathy, no carotid bruit, no JVD, supple, symmetrical, trachea midline and thyroid not enlarged. Resp: Clearing to auscultation bilaterally. Cardio: Regular rate and rhythm, S1, S2 normal, II/VI systolic murmur, no click, rub or gallop GI: Soft, non-tender; bowel sounds normal; no organomegaly. Extremities: No edema, cyanosis or clubbing. Skin: Warm and dry.  Neurologic: Alert and oriented X 2, normal strength.   Assessment/Plan Acute Pulmonary embolism Abnormal troponin I from demand ischemia and Pulmonary infarcts Acute bilateral lower extremities DVT Syncope H/O Adrenal insufficiency CAD S/P stents HTN HLD S/P pituitary adenoma  Plan: Continue IV heparin. Oral anticoagulant soon. May need SNF if lives alone. Agree with home medications but hold  Torsemide for now.  Time spent: Review of old records, Lab, x-rays, EKG, other cardiac tests, examination, discussion with patient/Nurse/Tech over 70 minutes.  Ricki Rodriguez, MD  11/09/2023, 3:42 PM

## 2023-11-09 NOTE — Progress Notes (Addendum)
 89 year old with history of hypertension, hyperlipidemia, coronary artery disease, history of stroke and syncope, pituitary adenoma s/p surgery, adrenal insufficiency on hydrocortisone, CKD stage IIIb.  She was recently admitted for COVID-19 pneumonia 2/3-2/5 along with hypotension and hypovolemia.  She was readmitted 2/18-2/21 for acute hypoxemic respiratory failure due to pneumonia.  Treated with IV fluids IV antibiotics and discharged home.  Readmitted again 2/22-2/27 for generalized weakness and AKI.  Discharged to a SNF.  Presented back to ER for evaluation of syncope.  Patient was not feeling well all day did not eat anything but only Ensure.  She felt dizzy and passed out while she was sitting and talking to the son. Her son called EMS.  Patient reported ongoing shortness of breath since her last hospitalization. In the emergency room hypothermic with temperature 94.8 with EMS and gradually improved.  Oxygen 80% on room air requiring 6 L.  Blood pressure stable.  WBC 5.3.  Creatinine 1.7 with baseline creatinine 1.4.  COVID influenza RSV negative.  Blood cultures collected.  D-dimer 9.95.  Chest x-ray with increasing bandlike opacity left lung base likely atelectasis.  CT head no acute findings.  Patient was given IV fluid boluses, started on vancomycin and cefepime in the ER to cover for possible pneumonia.  Her D-dimer was more than 9 so started on heparin with VQ scan pending.   Patient seen and examined.  Admitted early morning hours with history as above. Patient does not have any complaints as such.  She denied any chest pain.  She was on 6 L of oxygen however she was comfortable and without any respiratory compromise.  Currently on heparin infusion.  Elevated D-dimer Elevated troponins with no ischemic EKG changes Suspected pulmonary embolism Acute hypoxemic respiratory failure  Continue empiric heparin therapy pending VQ scan.  Will avoid contrasted studies.  Will check duplexes of lower  extremities and echocardiogram to look for any evidence of ventricular failure. Troponins are likely demand ischemia as well suspected pulmonary embolism. Check orthostatic symptoms.  PT OT. I called and updated patient's son on the phone.    Same-day admit.  No charge visit.  Addendum: VQ scan consistent with bilateral lower lobe perfusion defect not present on VQ scan 2 months ago. Duplex is positive for bilateral acute DVTs on multiple veins. Patient is on 3 to 4 L of oxygen.  She likely has high clot burden however she is currently fairly stable so less likely a candidate for thrombectomy or thrombolysis.  Will wait for results of echocardiogram.

## 2023-11-09 NOTE — H&P (Signed)
 History and Physical    [name] JXB:[id number] DOB: 16-Mar-1935 DOA: 11/08/2023  PCP: Orpah Cobb, MD  Patient coming from: Home  Chief Complaint: Syncope  HPI: Adriana Jordan is a 89 y.o. female with medical history significant of hypertension, hyperlipidemia, CAD, CVA, syncope, GERD, pituitary adenoma status postsurgical intervention and radiation, adrenal insufficiency on hydrocortisone, osteoarthritis, CKD stage IIIb.  Recently admitted 2/3-2/5 for COVID-19, pneumonia, hypotension/hypovolemia, and near syncope.  Admitted 2/18-2/21 for acute hypoxemic respiratory failure due to pneumonia and hypotension/hypovolemia.  She was treated with antibiotics and IV fluids.  Discharged home.  Admitted again 2/22-2/27 for generalized weakness and AKI.  Treated with IV fluids and was discharged to SNF.    Patient presents to the ED today for evaluation of syncope.  States she was not feeling well all day and did not eat anything.  She only had an Ensure and then later sat down on her deck where she felt dizzy and passed out.  Her son called EMS.  She reports ongoing shortness of breath since her hospitalization for pneumonia in February.  Denies chest pain.  Denies fevers, cough, nausea, vomiting, diarrhea, or dysuria.  She takes torsemide at home which makes her urinate frequently.  She is taking hydrocortisone twice daily and not missed any doses.  No other complaints.  ED Course: Hypothermic to 94.8 F with EMS and required thermal blanket. Hypoxic to the low 80s and requiring 6 L [place] to maintain sats in the 90s.  Not hypotensive.  Labs notable for WBC count 5.3, hemoglobin 12.1 (improved), glucose 120, BUN 24, creatinine 1.7 (baseline 1.2-1.4), AST 52 and remainder of LFTs normal, COVID/influenza/RSV PCR negative, blood cultures collected, BNP normal, D-dimer 9.95, lactic acid 2.5> 1.3.  Chest x-ray showing increasing bandlike opacity at the left lung base favored to be atelectasis.  CT head  negative for acute intracranial abnormality. Patient was given vancomycin, cefepime, 500 mL LR, and started on IV heparin to cover for possible PE until VQ scan can be done in the morning to confirm.  Review of Systems:  Review of Systems  All other systems reviewed and are negative.   Past Medical History:  Diagnosis Date   Acute respiratory failure with hypoxia (HCC) 11/15/2017   AKI (acute kidney injury) (HCC) 11/15/2017   Arthritis    "all over" (11/14/2017)   Brain tumor (HCC)    "I still have it"; not cancer (11/14/2017)   Chest pain, atypical 09/11/2021   Chronic lower back pain    Community acquired pneumonia 02/14/2023   Coronary artery disease    Dr. Algie Coffer   Elevated CK 11/15/2017   GERD (gastroesophageal reflux disease)    History of blood transfusion    "don't remember why" (11/14/2017)   History of radiation therapy 07/03/2018   brain, pituitary/ 12.5 Gy in 1 fraction   Hypercholesteremia    Hypertension    Influenza A 11/14/2017   MI (myocardial infarction) (HCC) 2006   PONV (postoperative nausea and vomiting)    Skin cancer    "right forearm"    Past Surgical History:  Procedure Laterality Date   ANTERIOR CERVICAL DECOMP/DISCECTOMY FUSION     BACK SURGERY     CARPAL TUNNEL RELEASE Bilateral    CORONARY ANGIOPLASTY     DES mid LAD and mid RCA 09/21/04   CRANIOTOMY N/A 05/21/2015   Procedure: Transsphenoidal resection of pituitary tumor with Dr. Narda Bonds for approach;  Surgeon: Hilda Lias, MD;  Location: [facility];  Service:  Neurosurgery;  Laterality: N/A;  Transsphenoidal resection of pituitary tumor with Dr. Narda Bonds for approach   SHOULDER OPEN ROTATOR CUFF REPAIR Bilateral    SKIN CANCER EXCISION Right    forearm   thumb surgery Right    placed srews to make straight   THYROID SURGERY     "goiter removed"     reports that she quit smoking about 54 years ago. Her smoking use included cigarettes. She started smoking about 59 years ago. She  has a 0.6 pack-year smoking history. She has never used smokeless tobacco. She reports that she does not currently use alcohol. She reports that she does not use drugs.  Allergies  Allergen Reactions   Codeine Nausea And Vomiting    Tolerates hydrocodone    Family History  Problem Relation Age of Onset   Cancer Father 83       throat- smoker    Breast cancer Sister 60   Cancer Brother 38       lung- smoker    Prior to Admission medications   Medication Sig Start Date End Date Taking? Authorizing Provider  aspirin EC 81 MG tablet Take 1 tablet (81 mg total) by mouth daily. Swallow whole. 09/30/23  Yes Dahal, Melina Schools, MD  cetirizine (ZYRTEC) 10 MG tablet Take 10 mg by mouth daily.   Yes [provider]  diclofenac Sodium (VOLTAREN) 1 % GEL Apply 4 g topically as needed (shoulder pain).   Yes [provider]  feeding supplement (ENSURE ENLIVE / ENSURE PLUS) LIQD Take 237 mLs by mouth 2 (two) times daily between meals. Patient taking differently: Take 1-2 Bottles by mouth daily as needed (skipped meal). 02/20/23  Yes Rinaldo Cloud, MD  hydrocortisone (CORTEF) 10 MG tablet Take 1 tablet (10 mg total) by mouth every evening. 09/30/23  Yes Dahal, Melina Schools, MD  hydrocortisone (CORTEF) 20 MG tablet Take 1 tablet (20 mg total) by mouth every morning. 09/30/23  Yes Dahal, Melina Schools, MD  ibuprofen (ADVIL) 200 MG tablet Take 400 mg by mouth every 6 (six) hours as needed for headache or mild pain (pain score 1-3) (shoulder pain).   Yes [provider]  lidocaine 4 % Place 1 patch onto the skin daily.   Yes [provider]  rosuvastatin (CRESTOR) 20 MG tablet Take 20 mg by mouth every evening. 10/15/22  Yes [provider]  torsemide (DEMADEX) 10 MG tablet Take 10 mg by mouth daily as needed (fluid, swelling).   Yes [provider]    Physical Exam: Vitals:   11/08/23 2100 11/08/23 2115 11/08/23 2130 11/08/23 2145  BP: (!) 151/77 (!) 151/94 (!) 151/88  (!) 145/79  Pulse: 92 94 100 (!) 59  Resp: 16 16 14 19   Temp:      TempSrc:      SpO2: 100% 98% 92% 100%  Weight:      Height:        Physical Exam Vitals reviewed.  Constitutional:      General: She is not in acute distress. HENT:     Head: Normocephalic and atraumatic.     Mouth/Throat:     Mouth: Mucous membranes are dry.  Eyes:     Extraocular Movements: Extraocular movements intact.  Cardiovascular:     Rate and Rhythm: Normal rate and regular rhythm.     Pulses: Normal pulses.  Pulmonary:     Effort: Pulmonary effort is normal. No respiratory distress.     Breath sounds: Normal breath sounds. No wheezing or  rales.  Abdominal:     General: Bowel sounds are normal. There is no distension.     Palpations: Abdomen is soft.     Tenderness: There is no abdominal tenderness. There is no guarding.  Musculoskeletal:     Cervical back: Normal range of motion.     Right lower leg: Edema present.     Left lower leg: Edema present.     Comments: Bilateral pedal edema, nonpitting  Skin:    General: Skin is warm and dry.  Neurological:     General: No focal deficit present.     Mental Status: She is alert and oriented to person, place, and time.     Cranial Nerves: No cranial nerve deficit.     Sensory: No sensory deficit.     Motor: No weakness.     Labs on Admission: I have personally reviewed following labs and imaging studies  CBC: Recent Labs  Lab 11/08/23 1812  WBC 5.3  NEUTROABS 3.0  HGB 12.1  HCT 38.2  MCV 96.7  PLT 279   Basic Metabolic Panel: Recent Labs  Lab 11/08/23 1812  NA 137  K 4.1  CL 97*  CO2 23  GLUCOSE 120*  BUN 24*  CREATININE 1.72*  CALCIUM 9.8   GFR: Estimated Creatinine Clearance: 23.8 mL/min (A) (by C-G formula based on SCr of 1.72 mg/dL (H)). Liver Function Tests: Recent Labs  Lab 11/08/23 1812  AST 52*  ALT 27  ALKPHOS 59  BILITOT 0.6  PROT 6.9  ALBUMIN 4.3   No results for input(s): "LIPASE", "AMYLASE" in the last  168 hours. No results for input(s): "AMMONIA" in the last 168 hours. Coagulation Profile: Recent Labs  Lab 11/08/23 1812  INR 0.9   Cardiac Enzymes: No results for input(s): "CKTOTAL", "CKMB", "CKMBINDEX", "TROPONINI" in the last 168 hours. BNP (last 3 results) No results for input(s): "PROBNP" in the last 8760 hours. HbA1C: No results for input(s): "HGBA1C" in the last 72 hours. CBG: No results for input(s): "GLUCAP" in the last 168 hours. Lipid Profile: No results for input(s): "CHOL", "HDL", "LDLCALC", "TRIG", "CHOLHDL", "LDLDIRECT" in the last 72 hours. Thyroid Function Tests: No results for input(s): "TSH", "T4TOTAL", "FREET4", "T3FREE", "THYROIDAB" in the last 72 hours. Anemia Panel: No results for input(s): "VITAMINB12", "FOLATE", "FERRITIN", "TIBC", "IRON", "RETICCTPCT" in the last 72 hours. Urine analysis:    Component Value Date/Time   COLORURINE YELLOW 10/01/2023 1919   APPEARANCEUR CLEAR 10/01/2023 1919   LABSPEC 1.012 10/01/2023 1919   PHURINE 5.0 10/01/2023 1919   GLUCOSEU NEGATIVE 10/01/2023 1919   HGBUR NEGATIVE 10/01/2023 1919   BILIRUBINUR NEGATIVE 10/01/2023 1919   KETONESUR NEGATIVE 10/01/2023 1919   PROTEINUR NEGATIVE 10/01/2023 1919   UROBILINOGEN 0.2 03/01/2015 2045   NITRITE NEGATIVE 10/01/2023 1919   LEUKOCYTESUR NEGATIVE 10/01/2023 1919    Radiological Exams on Admission: CT HEAD WO CONTRAST ( ) Result Date: 11/08/2023 CLINICAL DATA:  Syncope EXAM: CT HEAD WITHOUT CONTRAST TECHNIQUE: Contiguous axial images were obtained from the base of the skull through the vertex without intravenous contrast. RADIATION DOSE REDUCTION: This exam was performed according to the departmental dose-optimization program which includes automated exposure control, adjustment of the mA and/or kV according to patient size and/or use of iterative reconstruction technique. COMPARISON:  10/01/2023 FINDINGS: Brain: Normal anatomic configuration. Parenchymal volume loss is  commensurate with the patient's age. Stable moderate periventricular white matter changes are present likely reflecting the sequela of small vessel ischemia. No abnormal intra or extra-axial mass lesion or  fluid collection. No abnormal mass effect or midline shift. No evidence of acute intracranial hemorrhage or infarct. Ventricular size is normal. Cerebellum unremarkable. Vascular: No asymmetric hyperdense vasculature at the skull base. Skull: Intact Sinuses/Orbits: Paranasal sinuses are clear. Orbits are unremarkable. Other: Mastoid air cells and middle ear cavities are clear. IMPRESSION: 1. No acute intracranial hemorrhage or infarct. 2. Stable senescent change. Electronically Signed   By: Helyn Numbers M.D.   On: 11/08/2023 23:19   DG Chest Port 1 View Result Date: 11/08/2023 CLINICAL DATA:  Sepsis.  Low O2 sat EXAM: PORTABLE CHEST 1 VIEW COMPARISON:  X-ray 10/01/2023 and older FINDINGS: Hyperinflation. Chronic lung changes. There is some bandlike changes at the left lung base. Increased from previous. Favor atelectasis. Recommend follow-up. No pneumothorax, effusion or edema. Normal cardiopericardial silhouette calcified aorta. Calcified tracheobronchial tree. Fixation hardware at the edge of the imaging field along the lower cervical spine. IMPRESSION: Hyperinflation with chronic changes. Increasing bandlike opacity left lung base such as atelectasis. Electronically Signed   By: Karen Kays M.D.   On: 11/08/2023 18:51    EKG: Independently reviewed.  Sinus rhythm, LAFB, multiple PVCs.  No acute ischemic changes.  Assessment and Plan  Syncope Suspecting dehydration/volume depletion as she takes a diuretic at home and reporting poor p.o. intake.  Has dry mucous membranes on exam.  However, PE is also on the differential given positive D-dimer and hypoxemia. She does have history of adrenal insufficiency but blood pressure has remained stable in the ED.  EKG showing sinus rhythm and multiple PVCs but  no acute ischemic changes.  ACS less likely as patient is not endorsing chest pain.  Echo done in December 2024 normal EF, mild MR, and no AS.  CT head negative for acute intracranial abnormality and no focal neurodeficit on exam.  Not hypoglycemic or anemic.  -Continue IV heparin -VQ scan in the morning to rule out PE -Check troponin -Fall precautions -Cardiac monitoring -IV fluid hydration -Check orthostatics  Hypothermia ?Sepsis Question whether hypothermia is due to sepsis versus adrenal insufficiency.  Blood pressure stable.  Mild lactic acidosis resolved after 500 mL IV fluids.  Does not meet any SIRS criteria at this time.  No obvious infectious source.  Initially required thermal blanket by EMS but hypothermia has now resolved.  TSH was normal on 10/01/2023.  Patient was given vancomycin and cefepime in the ED, check procalcitonin level.  UA ordered to rule out UTI.  Follow-up blood cultures.  Acute hypoxemic respiratory failure Per conversation with ED provider, she was hypoxic to the low 80s on room air in the ED.  Currently satting in the high 90s on 4 L [place].  No respiratory distress.  COVID/influenza/RSV PCR negative.  Chest x-ray showing increasing bandlike opacity at the left lung base favored to be atelectasis.  Pneumonia less likely given no fever or leukocytosis.  Patient was already given antibiotics in the ED, checking procalcitonin level.  Given syncope, hypoxemia, and positive D-dimer, patient has been started on IV heparin to cover for possible PE until VQ scan can be done in the morning.  AKI on CKD stage IIIb Likely due to dehydration.  BUN 24, creatinine 1.7 (baseline 1.2-1.4).  Continue IV fluid hydration and monitor renal function.  Avoid nephrotoxic agents/IV contrast.  Hold home torsemide.  Mild transaminase elevation AST 52 and remainder of LFTs normal.  No history of alcohol abuse.  Repeat LFTs in the morning.  Chronic adrenal insufficiency Blood pressure stable  and do not think  stress dose IV steroids are indicated at this time.  Continue her home hydrocortisone.  Hyperlipidemia Hold statin at this time and repeat LFTs in the morning.  CAD EKG without acute ischemic changes and patient is not endorsing chest pain.  Already on IV heparin.  Checking troponin.  DVT prophylaxis: IV heparin gtt Code Status: Full Code (discussed with the patient) Family Communication: No family available at this time. Level of care: Progressive Care Unit Admission status: It is my clinical opinion that admission to INPATIENT is reasonable and necessary because of the expectation that this patient will require hospital care that crosses at least 2 midnights to treat this condition based on the medical complexity of the problems presented.  Given the aforementioned information, the predictability of an adverse outcome is felt to be significant.  John Giovanni MD [facility]  If 7PM-7AM, please contact night-coverage [email or web address]  11/09/2023, 12:27 AM

## 2023-11-09 NOTE — Consult Note (Signed)
 Palliative Medicine Inpatient Consult Note  Consulting Provider: Dr. Jerral Ralph  Reason for consult:   Palliative Care Consult Services Palliative Medicine Consult  Reason for Consult? goal of care   11/09/2023  HPI:  Per intake H&P --> Adriana Jordan is a 89 y.o. female with medical history significant of hypertension, hyperlipidemia, CAD, CVA, syncope, GERD, pituitary adenoma status postsurgical intervention and radiation, adrenal insufficiency on hydrocortisone, osteoarthritis, CKD stage IIIb.  Has had four hospitalizations since February. The Palliative care team has been asked to discuss goals of care in the setting of recurrent re-hospitalizations.   Clinical Assessment/Goals of Care:  *Please note that this is a verbal dictation therefore any spelling or grammatical errors are due to the "Dragon Medical One" system interpretation.  I have reviewed medical records including EPIC notes, labs and imaging, received report from bedside RN, assessed the patient who is lying in bed in NAD.    I met with Adriana Jordan to further discuss diagnosis prognosis, GOC, EOL wishes, disposition and options.   I introduced Palliative Medicine as specialized medical care for people living with serious illness. It focuses on providing relief from the symptoms and stress of a serious illness. The goal is to improve quality of life for both the patient and the family.  Medical History Review and Understanding:  A review of patients past medical history inclusive of HTN, HLD, CAD, CVA, GERD, OA, CKD, adrenal insufficiency, and pituitary adenoma status postsurgical intervention and radiation.  Social History:  Adriana Jordan is from [place], [place]. She is a widow. She has one son, Adriana Jordan. She has one grandchild who she has never met. 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 She shares that she has a dog who "showed up" to her door two  years ago named Lulu who bring her great joy. She also has six cats though her favorite is an orange cat named, Pumpkin. She is a woman of the [employer].   Functional and Nutritional State:  Adriana Jordan was recently at [facility] in [place]. She transitioned home where she lives with her son, Adriana Jordan. She is able to walk with a walker and attend to her bADL's independently. She has had a fairly good appetite.   Advance Directives:  A detailed discussion was had today regarding advanced directives.  Patient does have advanced directives which have been scanned into Vynca.   Code Status:  Concepts specific to code status, artifical feeding and hydration, continued IV antibiotics and rehospitalization was had.  The difference between a aggressive medical intervention path  and a palliative comfort care path for this patient at this time was had.   Encouraged patient/family to consider DNR/DNI status understanding evidenced based poor outcomes in similar hospitalized patient, as the cause of arrest is likely associated with advanced chronic/terminal illness rather than an easily reversible acute cardio-pulmonary event. I explained that DNR/DNI does not change the medical plan and it only comes into effect after a person has arrested (died).  It is a protective measure to keep [place] from harming the patient in their last moments of life. Adriana Jordan was agreeable to DNR/DNI with understanding that patient would not receive CPR, defibrillation, ACLS medications, or intubation.   Discussion:  Adriana Jordan and I reviewed the circumstances surrounding her hospitalization. We discussed her prior hospitalizations over the month of February related to her COVID-PNA. We reviewed that she had been feeling stronger when transitioned home. Yesterday,  while out with her son on the deck while he was grilling she started to feel dizzy. She then passed out.  I reviewed the imaging studies with Adriana Jordan - she did not know she had BLE DVTs  or a PE. We reviewed that she is presently on heparin. I shared with her the concerns associated with blood clots.   Discussed the chronic disease trajectory and long term burden and decline(s) associated with this.   In regards to [place] goals, she at  this time would like for [place] to continue to treat what is treatable.   Discussed the importance of continued conversation with family and their  medical providers regarding overall plan of care and treatment options, ensuring decisions are within the context of the patients values and GOCs. _______________________ Addendum:  I called patients son, Adriana Jordan and alerted him to the above. He shares understanding and vocalizes the desire to respect his mothers wishes.   Decision Maker: Belo,Tyrone (Son): [phone] (Mobile)   SUMMARY OF RECOMMENDATIONS   DNAR/DNI  Open and honest conversations held in the setting of Adriana Jordan's acute hospitalization  Patients goals are to continue to allow time for outcomes, she would ideally like to get home with her dog and cat(s)  Ongoing support  Code Status/Advance Care Planning: DNAR/DNI  Palliative Prophylaxis:  Aspiration, Bowel Regimen, Delirium Protocol, Frequent Pain Assessment, Oral Care, Palliative Wound Care, and Turn Reposition  Additional Recommendations (Limitations, Scope, Preferences): Continue current care  Psycho-social/Spiritual:  Desire for further Chaplaincy support: Yes Additional Recommendations: Education on disease burden   Prognosis: Frail, recurring re-hospitalizations, increase 12 month mortality risk.   Discharge Planning: Discharge plan dependent on patient improvement(s).   Vitals:   11/09/23 1249 11/09/23 1430  BP: 113/68 119/70  Pulse: 77 83  Resp: 14 14  Temp:    SpO2: 100% 97%    Intake/Output Summary (Last 24 hours) at 11/09/2023 1502 Last data filed at 11/08/2023 2224 Gross per 24 hour  Intake 901.52 ml  Output --  Net 901.52 ml   Last Weight  Most  recent update: 11/08/2023  5:29 PM    Weight  74.8 kg (165 lb)            Gen:  Frail elderly F in NAD HEENT: moist mucous membranes CV: Regular rate and rhythm  PULM: On 1LPM [place], breathing is even ABD: soft/nontender  EXT: BLE edema  Neuro: Alert and oriented x3   PPS: 50%   This conversation/these recommendations were discussed with patient primary care team, Dr. Jerral Ralph  Billing based on MDM: High ______________________________________________________ Lamarr Lulas [facility] Palliative Medicine Team Team Cell Phone: [phone] Please utilize secure chat with additional questions, if there is no response within 30 minutes please call the above phone number  Palliative Medicine Team providers are available by phone from 7am to 7pm daily and can be reached through the team cell phone.  Should this patient require assistance outside of these hours, please call the patient's attending physician.

## 2023-11-09 NOTE — ED Notes (Signed)
 Rn attempted to get secondary IV started x2 .

## 2023-11-09 NOTE — Progress Notes (Signed)
 PHARMACY - ANTICOAGULATION CONSULT NOTE  Pharmacy Consult for IV heparin Indication: pulmonary embolus  Allergies  Allergen Reactions   Codeine Nausea And Vomiting    Tolerates hydrocodone    Patient Measurements: Height: 5\' 6"  (167.6 cm) Weight: 74.8 kg (165 lb) IBW/kg (Calculated) : 59.3 HEPARIN DW (KG): 74.3  Vital Signs: Temp: 99 F (37.2 C) (04/02 2128) Temp Source: Oral (04/02 2128) BP: 97/65 (04/02 2128) Pulse Rate: 92 (04/02 2128)  Labs: Recent Labs    11/08/23 1812 11/09/23 0245 11/09/23 0511 11/09/23 0744 11/09/23 0925 11/09/23 1240 11/09/23 1634 11/09/23 2103  HGB 12.1 12.1  --   --   --   --   --   --   HCT 38.2 39.2  --   --   --   --   --   --   PLT 279 233  --   --   --   --   --   --   APTT 26  --   --   --   --   --   --   --   LABPROT 12.7  --   --   --   --   --   --   --   INR 0.9  --   --   --   --   --   --   --   HEPARINUNFRC  --   --   --  1.09*  --   --   --  0.63  CREATININE 1.72* 1.59*  --   --   --   --   --   --   TROPONINIHS  --  652*   < > 754* 529* 233* 270*  --    < > = values in this interval not displayed.    Estimated Creatinine Clearance: 25.8 mL/min (A) (by C-G formula based on SCr of 1.59 mg/dL (H)).   Medical History: Past Medical History:  Diagnosis Date   Acute respiratory failure with hypoxia (HCC) 11/15/2017   AKI (acute kidney injury) (HCC) 11/15/2017   Arthritis    "all over" (11/14/2017)   Brain tumor (HCC)    "I still have it"; not cancer (11/14/2017)   Chest pain, atypical 09/11/2021   Chronic lower back pain    Community acquired pneumonia 02/14/2023   Coronary artery disease    Dr. Algie Coffer   Elevated CK 11/15/2017   GERD (gastroesophageal reflux disease)    History of blood transfusion    "don't remember why" (11/14/2017)   History of radiation therapy 07/03/2018   brain, pituitary/ 12.5 Gy in 1 fraction   Hypercholesteremia    Hypertension    Influenza A 11/14/2017   MI (myocardial infarction) (HCC)  2006   PONV (postoperative nausea and vomiting)    Skin cancer    "right forearm"    Assessment: Adriana Jordan is a 89 y.o. year old female admitted on 11/08/2023 with acute respiratory failure and concern for PE. No anticoagulation prior to admission. Pharmacy consulted to dose heparin.   Heparin level came back therapeutic tonight at 0.63. We will continue with the current rate and check a confirm level in AM.   Goal of Therapy:  Heparin level 0.3-0.7 units/ml Monitor platelets by anticoagulation protocol: Yes   Plan:  Continue heparin 1000 units/hr Daily heparin level, CBC, and monitoring for bleeding F/u plans for anticoagulation and PE work up   Ulyses Southward, PharmD, [place], AAHIVP, CPP Infectious Disease Pharmacist 11/09/2023 10:24  PM

## 2023-11-09 NOTE — Progress Notes (Addendum)
  Elevated troponin-demand ischemia - Initial troponin was 20 which has been increased to 652. -EKG showing normal sinus rhythm, multiple premature ventricular complex and heart rate 92. -Repeat EKG normal sinus rhythm heart rate 82, premature ventricular complex and and left anterior fascicular block. - Patient denies any chest pain. -At this moment there is no concern for acute coronary syndrome. -Elevated troponin secondary to demand ischemia in the context of acute hypoxic respiratory failure possibly from underlying pulmonary embolism. - In the ED patient has been started on heparin and plan to continue continue heparin drip. - Continue to trend troponin. - Obtaining echocardiogram. - Need to follow-up with nuclear medicine study and bilateral lower extremity DVT ultrasound. -Discussed case with on-call cardiology Dr. Brayton Layman recommended continue to trend troponin, obtain echocardiogram and if echocardiogram shows any abnormality in that case need to inform cardiology for formal consult.  Per cardiology increase patient having elevated troponin in the setting of demand ischemia.  Continue to monitor for any chest pain and heparin drip.    Patient has been admitted for workup for syncope and acute hypoxic respiratory failure.  Found to elevated D-dimer.  Patient also has AKI on CKD stage IIIb which is limiting to obtain CTA chest.  However concern for possible underlying PE so nuclear medicine study has been ordered and patient has been started empirically with IV heparin drip. -Blood pressure is being stable.,  Pending UA.  Initial lactic acid was 2.5 which has been improved to 1.3 after 500 bolus.  Blood cultures are in process.  In the ED patient has been given IV vancomycin and cefepime.  Chest x-ray showing hyperinflation with chronic changes no evidence of pneumonia.  Patient is afebrile.  No leukocytosis.  At this moment there is no concern for infection.   Tereasa Coop, MD [facility] 11/09/2023, 5:46 AM

## 2023-11-09 NOTE — Progress Notes (Signed)
 OT Cancellation Note  Patient Details Name: Adriana Jordan MRN: [id number] DOB: 01-21-1935   Cancelled Treatment:    Reason Eval/Treat Not Completed: Patient at procedure or test/ unavailable at nuclear Medicine. Also concern for PE, Heparin started 11/08/23 at 23:22  [name] 11/09/2023, 9:24 AM  [name] OTR/L Acute Rehabilitation Services Office: [phone]

## 2023-11-09 NOTE — Progress Notes (Signed)
 Bilateral lower extremity venous duplex has been completed.  Results can be found in chart review under CV Proc. and MD at beside during exam.  11/09/2023 12:03 PM  Ivy Meriwether Durenda Age, RVT.

## 2023-11-09 NOTE — Progress Notes (Signed)
 PHARMACY - ANTICOAGULATION CONSULT NOTE  Pharmacy Consult for IV heparin Indication: pulmonary embolus  Allergies  Allergen Reactions   Codeine Nausea And Vomiting    Tolerates hydrocodone    Patient Measurements: Height: 5\' 6"  (167.6 cm) Weight: 74.8 kg (165 lb) IBW/kg (Calculated) : 59.3 HEPARIN DW (KG): 74.3  Vital Signs: Temp: 97.7 F (36.5 C) (04/02 0839) Temp Source: Oral (04/02 0839) BP: 113/66 (04/02 0730) Pulse Rate: 83 (04/02 0730)  Labs: Recent Labs    11/08/23 1812 11/09/23 0245 11/09/23 0511 11/09/23 0744  HGB 12.1 12.1  --   --   HCT 38.2 39.2  --   --   PLT 279 233  --   --   APTT 26  --   --   --   LABPROT 12.7  --   --   --   INR 0.9  --   --   --   HEPARINUNFRC  --   --   --  1.09*  CREATININE 1.72* 1.59*  --   --   TROPONINIHS  --  652* 691* 754*    Estimated Creatinine Clearance: 25.8 mL/min (A) (by C-G formula based on SCr of 1.59 mg/dL (H)).   Medical History: Past Medical History:  Diagnosis Date   Acute respiratory failure with hypoxia (HCC) 11/15/2017   AKI (acute kidney injury) (HCC) 11/15/2017   Arthritis    "all over" (11/14/2017)   Brain tumor (HCC)    "I still have it"; not cancer (11/14/2017)   Chest pain, atypical 09/11/2021   Chronic lower back pain    Community acquired pneumonia 02/14/2023   Coronary artery disease    Dr. Algie Coffer   Elevated CK 11/15/2017   GERD (gastroesophageal reflux disease)    History of blood transfusion    "don't remember why" (11/14/2017)   History of radiation therapy 07/03/2018   brain, pituitary/ 12.5 Gy in 1 fraction   Hypercholesteremia    Hypertension    Influenza A 11/14/2017   MI (myocardial infarction) (HCC) 2006   PONV (postoperative nausea and vomiting)    Skin cancer    "right forearm"    Assessment: Adriana Jordan is a 89 y.o. year old female admitted on 11/08/2023 with acute respiratory failure and concern for PE. No anticoagulation prior to admission. Pharmacy consulted to  dose heparin.  Heparin level 1.09 on 1200 units/hr. No overt s/sx of bleeding reported by RN.   Goal of Therapy:  Heparin level 0.3-0.7 units/ml Monitor platelets by anticoagulation protocol: Yes   Plan:  Hold heparin x1 hour Decrease heparin infusion to 1000 units/hr 8 heparin level  Daily heparin level, CBC, and monitoring for bleeding F/u plans for anticoagulation and PE work up   Thank you for allowing pharmacy to participate in this patient's care.  Ruben Im, PharmD Clinical Pharmacist 11/09/2023 10:38 AM Please check AMION for all [facility] Pharmacy numbers

## 2023-11-09 NOTE — Plan of Care (Signed)

## 2023-11-10 ENCOUNTER — Inpatient Hospital Stay: Admitting: Internal Medicine

## 2023-11-10 ENCOUNTER — Other Ambulatory Visit (HOSPITAL_COMMUNITY): Payer: Self-pay

## 2023-11-10 DIAGNOSIS — I2699 Other pulmonary embolism without acute cor pulmonale: Secondary | ICD-10-CM | POA: Diagnosis not present

## 2023-11-10 DIAGNOSIS — R0600 Dyspnea, unspecified: Secondary | ICD-10-CM

## 2023-11-10 DIAGNOSIS — N179 Acute kidney failure, unspecified: Secondary | ICD-10-CM | POA: Diagnosis not present

## 2023-11-10 DIAGNOSIS — E2740 Unspecified adrenocortical insufficiency: Secondary | ICD-10-CM | POA: Diagnosis not present

## 2023-11-10 LAB — CBC
HCT: 32.6 % — ABNORMAL LOW (ref 36.0–46.0)
Hemoglobin: 10.8 g/dL — ABNORMAL LOW (ref 12.0–15.0)
MCH: 31 pg (ref 26.0–34.0)
MCHC: 33.1 g/dL (ref 30.0–36.0)
MCV: 93.7 fL (ref 80.0–100.0)
Platelets: 250 10*3/uL (ref 150–400)
RBC: 3.48 MIL/uL — ABNORMAL LOW (ref 3.87–5.11)
RDW: 15.6 % — ABNORMAL HIGH (ref 11.5–15.5)
WBC: 5.1 10*3/uL (ref 4.0–10.5)
nRBC: 0 % (ref 0.0–0.2)

## 2023-11-10 LAB — BASIC METABOLIC PANEL WITH GFR
Anion gap: 13 (ref 5–15)
BUN: 22 mg/dL (ref 8–23)
CO2: 23 mmol/L (ref 22–32)
Calcium: 10 mg/dL (ref 8.9–10.3)
Chloride: 102 mmol/L (ref 98–111)
Creatinine, Ser: 1.45 mg/dL — ABNORMAL HIGH (ref 0.44–1.00)
GFR, Estimated: 35 mL/min — ABNORMAL LOW (ref >60)
Glucose, Bld: 179 mg/dL — ABNORMAL HIGH (ref 70–99)
Potassium: 4.2 mmol/L (ref 3.5–5.1)
Sodium: 138 mmol/L (ref 135–145)

## 2023-11-10 LAB — HEPARIN LEVEL (UNFRACTIONATED): Heparin Unfractionated: 0.85 [IU]/mL — ABNORMAL HIGH (ref 0.30–0.70)

## 2023-11-10 MED ORDER — APIXABAN 5 MG PO TABS
10.0000 mg | ORAL_TABLET | Freq: Two times a day (BID) | ORAL | Status: DC
  Administered 2023-11-10 (×2): 10 mg via ORAL
  Filled 2023-11-10 (×2): qty 2

## 2023-11-10 MED ORDER — APIXABAN 5 MG PO TABS: 5.0000 mg | ORAL_TABLET | Freq: Two times a day (BID) | ORAL | Status: DC

## 2023-11-10 MED ORDER — IPRATROPIUM-ALBUTEROL 0.5-2.5 (3) MG/3ML IN SOLN
3.0000 mL | Freq: Three times a day (TID) | RESPIRATORY_TRACT | Status: DC
  Administered 2023-11-10 – 2023-11-11 (×3): 3 mL via RESPIRATORY_TRACT
  Filled 2023-11-10 (×4): qty 3

## 2023-11-10 MED ORDER — ONDANSETRON HCL 4 MG/2ML IJ SOLN: 4.0000 mg | Freq: Four times a day (QID) | INTRAMUSCULAR | Status: DC | PRN

## 2023-11-10 NOTE — Consult Note (Signed)
 Ref: Orpah Cobb, MD  Subjective:  Awake. Feeling a little better. Patient aware of DVT in PE.  Objective:  Vital Signs in the last 24 hours: Temp:  [97.4 F (36.3 C)-99 F (37.2 C)] 97.4 F (36.3 C) (04/03 0456) Pulse Rate:  [68-98] 73 (04/03 0500) Cardiac Rhythm: Normal sinus rhythm (04/03 0744) Resp:  [13-25] 20 (04/03 0500) BP: (97-125)/(65-82) 122/69 (04/03 0500) SpO2:  [90 %-100 %] 96 % (04/03 0500) Weight:  [70.3 kg] 70.3 kg (04/03 0500)  Physical Exam: BP Readings from Last 1 Encounters:  11/10/23 122/69     Wt Readings from Last 1 Encounters:  11/10/23 70.3 kg    Weight change: -4.544 kg Body mass index is 25.01 kg/m. HEENT: [place]/AT, Eyes-Brown, Conjunctiva-Pink, Sclera-Non-icteric Neck: No JVD, No bruit, Trachea midline. Lungs:  Clear, Bilateral. Cardiac:  Regular rhythm, normal S1 and S2, no S3. II/VI systolic murmur. Abdomen:  Soft, non-tender. BS present. Extremities:  No edema present. No cyanosis. No clubbing. CNS: AxOx2, Cranial nerves grossly intact, moves all 4 extremities.  Skin: Warm and dry.   Intake/Output from previous day: 04/02 0701 - 04/03 0700 In: -  Out: 350 [Urine:350]    Lab Results: BMET    Component Value Date/Time   NA 138 11/10/2023 1124   NA 137 11/09/2023 0245   NA 137 11/08/2023 1812   K 4.2 11/10/2023 1124   K 3.7 11/09/2023 0245   K 4.1 11/08/2023 1812   CL 102 11/10/2023 1124   CL 101 11/09/2023 0245   CL 97 (L) 11/08/2023 1812   CO2 23 11/10/2023 1124   CO2 25 11/09/2023 0245   CO2 23 11/08/2023 1812   GLUCOSE 179 (H) 11/10/2023 1124   GLUCOSE 102 (H) 11/09/2023 0245   GLUCOSE 120 (H) 11/08/2023 1812   BUN 22 11/10/2023 1124   BUN 22 11/09/2023 0245   BUN 24 (H) 11/08/2023 1812   CREATININE 1.45 (H) 11/10/2023 1124   CREATININE 1.59 (H) 11/09/2023 0245   CREATININE 1.72 (H) 11/08/2023 1812   CALCIUM 10.0 11/10/2023 1124   CALCIUM 9.4 11/09/2023 0245   CALCIUM 9.8 11/08/2023 1812   GFRNONAA 35 (L)  11/10/2023 1124   GFRNONAA 31 (L) 11/09/2023 0245   GFRNONAA 28 (L) 11/08/2023 1812   GFRAA 46 (L) 11/15/2017 0439   GFRAA 40 (L) 11/14/2017 1250   GFRAA 56 (L) 11/09/2017 1341   CBC    Component Value Date/Time   WBC 5.1 11/10/2023 0455   RBC 3.48 (L) 11/10/2023 0455   HGB 10.8 (L) 11/10/2023 0455   HCT 32.6 (L) 11/10/2023 0455   PLT 250 11/10/2023 0455   MCV 93.7 11/10/2023 0455   MCH 31.0 11/10/2023 0455   MCHC 33.1 11/10/2023 0455   RDW 15.6 (H) 11/10/2023 0455   LYMPHSABS 1.6 11/08/2023 1812   MONOABS 0.5 11/08/2023 1812   EOSABS 0.2 11/08/2023 1812   BASOSABS 0.0 11/08/2023 1812   HEPATIC Function Panel Recent Labs    07/30/23 2108 09/12/23 1030 10/01/23 1355 10/02/23 0425 11/08/23 1812 11/09/23 0245  PROT 5.8*   < > 5.6* 5.1* 6.9 6.6  ALBUMIN 3.3*   < > 3.0* 2.5* 4.3 3.9  AST 32   < > 32 27 52* 32  ALT 17   < > 15 14 27 19   ALKPHOS 57   < > 34* 36* 59 55  BILIDIR 0.2  --  0.1  --   --   --   IBILI 0.6  --  0.6  --   --   --    < > =  values in this interval not displayed.   HEMOGLOBIN A1C Lab Results  Component Value Date   MPG 131.24 02/19/2023   CARDIAC ENZYMES Lab Results  Component Value Date   CKTOTAL 161 10/01/2023   BNP No results for input(s): "PROBNP" in the last 8760 hours. TSH Recent Labs    07/30/23 1158 09/12/23 0930 10/01/23 1450  TSH 4.096 3.215 2.544   CHOLESTEROL No results for input(s): "CHOL" in the last 8760 hours.  Scheduled Meds:  apixaban  10 mg Oral BID   Followed by   Melene Muller ON 11/17/2023] apixaban  5 mg Oral BID   feeding supplement  237 mL Oral BID BM   hydrocortisone  10 mg Oral QPM   hydrocortisone  20 mg Oral BH-[id number]   ipratropium-albuterol  3 mL Nebulization TID   loratadine  10 mg Oral Daily   rosuvastatin  20 mg Oral QPM   Continuous Infusions: PRN Meds:.acetaminophen **OR** acetaminophen, ondansetron (ZOFRAN) IV  Assessment/Plan: Acute Pulmonary embolism Abnormal troponin I from demand ischemia and  Pulmonary infarcts Acute bilateral lower extremities DVT Syncope H/O Adrenal insufficiency CAD S/P stents HTN HLD S/P pituitary adenoma  Plan: On oral anticoagulants. Discussed with patient care at home v/s SNF.   LOS: 2 days   Time spent including chart review, lab review, examination, discussion with patient/Nurse : 30 min   Orpah Cobb  MD  11/10/2023, 12:20 PM

## 2023-11-10 NOTE — Evaluation (Signed)
 Occupational Therapy Evaluation Patient Details Name: Adriana Jordan MRN: [id number] DOB: [birth year] Today's Date: 11/10/2023   History of Present Illness   Patient is a 89 year old female admitted with hypotension and generalized weakness on 10/01/23 after recent discharge from hospital with respiratory failure and pneumonia. History of HTN, HLD, CAD, CVA, syncope, GERD, pituitary adenoma, adrenal insufficiency     Clinical Impressions Pt resting comfortably in bed, feeling cold, hands/arms cold to touch, inconsistent pleth on O2 but when stable remains above 92%. Pt lives at home with son who is available 24/7, PLOF mod I with cane, able to perform IADLs around home. Pt currently close to baseline, increased effort for STS from low bed height, but once up able to ambulate 100 feet at supervision for safety using RW, seems as though she may not have even needed RW for support. Pt able to complete ADLs with set up. No OT follow up needed, will see acutely to maximize activity tolerance and strength with transfers to return to PLOF.      If plan is discharge home, recommend the following:   A little help with walking and/or transfers;A little help with bathing/dressing/bathroom;Assistance with cooking/housework;Assist for transportation;Help with stairs or ramp for entrance     Functional Status Assessment   Patient has had a recent decline in their functional status and demonstrates the ability to make significant improvements in function in a reasonable and predictable amount of time.     Equipment Recommendations   None recommended by OT     Recommendations for Other Services         Precautions/Restrictions   Precautions Precautions: Fall Restrictions Weight Bearing Restrictions Per Provider Order: No     Mobility Bed Mobility Overal bed mobility: Modified Independent             General bed mobility comments: increased time, no physical assistance  provided    Transfers Overall transfer level: Needs assistance Equipment used: Rolling walker (2 wheels) Transfers: Sit to/from Stand Sit to Stand: Contact guard assist           General transfer comment: CGA for safety, increased effort for STS but once up able to ambulate quickly without LOB      Balance Overall balance assessment: Mild deficits observed, not formally tested                                         ADL either performed or assessed with clinical judgement   ADL Overall ADL's : At baseline;Needs assistance/impaired Eating/Feeding: Independent   Grooming: Supervision/safety;Standing   Upper Body Bathing: Set up;Sitting   Lower Body Bathing: Set up;Sitting/lateral leans   Upper Body Dressing : Set up;Sitting   Lower Body Dressing: Set up;Supervision/safety;Sit to/from stand   Toilet Transfer: Supervision/safety;Rolling walker (2 wheels)   Toileting- Clothing Manipulation and Hygiene: Set up;Supervision/safety   Tub/ Shower Transfer: Set up;Supervision/safety   Functional mobility during ADLs: Supervision/safety General ADL Comments: Pt set up/superivsion for safety with ADLs     Vision Baseline Vision/History: 1 Wears glasses Ability to See in Adequate Light: 0 Adequate Patient Visual Report: No change from baseline       Perception         Praxis         Pertinent Vitals/Pain Pain Assessment Pain Assessment: No/denies pain     Extremity/Trunk Assessment Upper Extremity Assessment Upper Extremity Assessment:  Overall [facility] for tasks assessed   Lower Extremity Assessment Lower Extremity Assessment: Defer to PT evaluation       Communication Communication Communication: No apparent difficulties   Cognition Arousal: Alert Behavior During Therapy: WFL for tasks assessed/performed Cognition: No apparent impairments                               Following commands: Intact       Cueing  General  Comments   Cueing Techniques: Verbal cues  See orthostatics tab for details (SpO2 92-95% on RA at rest.)   Exercises     Shoulder Instructions      Home Living Family/patient expects to be discharged to:: Private residence Living Arrangements: Children Available Help at Discharge: Family;Available 24 hours/day Type of Home: House Home Access: Stairs to enter [employer] of Steps: 3 Entrance Stairs-Rails: Right Home Layout: One level     Bathroom Shower/Tub: Producer, television/film/video: Standard Bathroom Accessibility: Yes   Home Equipment: Agricultural consultant (2 wheels);Cane - single point;BSC/3in1   Additional Comments: Pt lives with son who is available 24/7      Prior Functioning/Environment Prior Level of Function : Needs assist             Mobility Comments: uses cane more than RW. ADLs Comments: Pt states independent, but son does most cooking/cleaning.    OT Problem List: Decreased strength;Decreased activity tolerance   OT Treatment/Interventions: Self-care/ADL training;Therapeutic exercise;Energy conservation;DME and/or AE instruction;Therapeutic activities;Patient/family education;Balance training      OT Goals(Current goals can be found in the care plan section)   Acute Rehab OT Goals Patient Stated Goal: to return home OT Goal Formulation: With patient Time For Goal Achievement: 11/24/23 Potential to Achieve Goals: Good   OT Frequency:  Min 1X/week    Co-evaluation              AM-PAC OT "6 Clicks" Daily Activity     Outcome Measure Help from another person eating meals?: None Help from another person taking care of personal grooming?: A Little Help from another person toileting, which includes using toliet, bedpan, or urinal?: A Little Help from another person bathing (including washing, rinsing, drying)?: A Little Help from another person to put on and taking off regular upper body clothing?: A Little Help from another  person to put on and taking off regular lower body clothing?: A Little 6 Click Score: 19   End of Session Equipment Utilized During Treatment: Gait belt;Rolling walker (2 wheels) Nurse Communication: Mobility status  Activity Tolerance: Patient tolerated treatment well Patient left: in bed;with call bell/phone within reach  OT Visit Diagnosis: Unsteadiness on feet (R26.81);Other abnormalities of gait and mobility (R26.89);Muscle weakness (generalized) (M62.81)                Time: 8657-8469 OT Time Calculation (min): 25 min Charges:  OT General Charges $OT Visit: 1 Visit OT Evaluation $OT Eval Low Complexity: 1 Low OT Treatments $Self Care/Home Management : 8-22 mins  [address], OTR/L   Alexis Goodell 11/10/2023, 2:07 PM

## 2023-11-10 NOTE — Progress Notes (Signed)
 PHARMACY - ANTICOAGULATION CONSULT NOTE  Pharmacy Consult for heparin Indication: pulmonary embolus  Labs: Recent Labs    11/08/23 1812 11/09/23 0245 11/09/23 0511 11/09/23 0744 11/09/23 0925 11/09/23 1240 11/09/23 1634 11/09/23 2103 11/10/23 0455  HGB 12.1 12.1  --   --   --   --   --   --  10.8*  HCT 38.2 39.2  --   --   --   --   --   --  32.6*  PLT 279 233  --   --   --   --   --   --  250  APTT 26  --   --   --   --   --   --   --   --   LABPROT 12.7  --   --   --   --   --   --   --   --   INR 0.9  --   --   --   --   --   --   --   --   HEPARINUNFRC  --   --   --  1.09*  --   --   --  0.63 0.85*  CREATININE 1.72* 1.59*  --   --   --   --   --   --   --   TROPONINIHS  --  652*   < > 754* 529* 233* 270*  --   --    < > = values in this interval not displayed.   Assessment: 89yo female supratherapeutic on heparin after one level at goal; no infusion issues or signs of bleeding per RN.  Goal of Therapy:  Heparin level 0.3-0.7 units/ml   Plan:  Decrease heparin infusion by 2 units/kg/hr to 850 units/hr. Check level in 8 hours.   Vernard Gambles, PharmD, BCPS 11/10/2023 5:58 AM

## 2023-11-10 NOTE — Progress Notes (Addendum)
 PROGRESS NOTE        PATIENT DETAILS Name: Adriana Jordan Age: 89 y.o. Sex: female Date of Birth: 11-27-1934 Admit Date: 11/08/2023 Admitting Physician John Giovanni, MD ZOX:[id number], Viviano Simas, MD  Brief Summary: Patient is a 89 y.o.  female with history of CAD, HLD, chronic adrenal insufficiency, CKD stage IIIb who presented with syncopal episode.  Upon further evaluation patient was found to have DVT/PE.  Significant events: 4/1>> admit to [facility]  Significant studies: 4/1>> CT head: No acute abnormalities 4/2>> VQ scan:+v havinge pulmonary embolism 4/2>> B/L lower extremity Doppler:+ve DVT left peroneal vein. 4/2>> Echo: EF 50-55%, grade 1 diastolic dysfunction  Significant microbiology data: 4/1>> COVID/influenza/RSV PCR: Negative 4/1>> blood culture: No growth  Procedures: None  Consults: Cardiology  Subjective: Lying comfortably in bed-denies any chest pain or shortness of breath.  Objective: Vitals: Blood pressure 122/69, pulse 73, temperature (!) 97.4 F (36.3 C), temperature source Oral, resp. rate 20, height 5\' 6"  (1.676 m), weight 70.3 kg, SpO2 96%.   Exam: Gen Exam:Alert awake-not in any distress HEENT:atraumatic, normocephalic Chest: B/L clear to auscultation anteriorly CVS:S1S2 regular Abdomen:soft non tender, non distended Extremities:no edema Neurology: Non focal Skin: no rash  Pertinent Labs/Radiology:    Latest Ref Rng & Units 11/10/2023    4:55 AM 11/09/2023    2:45 AM 11/08/2023    6:12 PM  CBC  WBC 4.0 - 10.5 K/uL 5.1  5.1  5.3   Hemoglobin 12.0 - 15.0 g/dL 09.8  11.9  14.7   Hematocrit 36.0 - 46.0 % 32.6  39.2  38.2   Platelets 150 - 400 K/uL 250  233  279     Lab Results  Component Value Date   NA 137 11/09/2023   K 3.7 11/09/2023   CL 101 11/09/2023   CO2 25 11/09/2023      Assessment/Plan: Syncope Probably secondary to PE Continue anticoagulation-on heparin-should be stable to be  transition to Eliquis  today  Acute hypoxic respiratory failure Secondary to PE Improving-Down to 2-2 L of oxygen Volume status stable-do not think she requires diuretics Titrate down FiO2 as tolerated Incentive spirometry/flutter valve/mobilize as much as possible  Elevated troponins Demand ischemia Supportive care Cardiology following  CKD stage IIIb Creatinine close to baseline Follow electrolytes periodically  Adrenal insufficiency History of pituitary adenoma-s/p resection/radiation. Hydrocortisone  Chronic HFpEF Euvolemic  HLD statin  Debility/deconditioning PT/OT eval  BMI: Estimated body mass index is 25.01 kg/m as calculated from the following:   Height as of this encounter: 5\' 6"  (1.676 m).   Weight as of this encounter: 70.3 kg.   Code status:   Code Status: Limited: Do not attempt resuscitation (DNR) -DNR-LIMITED -Do Not Intubate/DNI    DVT Prophylaxis: IV heparin>>Eliquis   Family Communication: None at bedside   Disposition Plan: Status is: Inpatient Remains inpatient appropriate because: Severity of illness   Planned Discharge Destination:Home health   Diet: Diet Order             Diet Heart Room service appropriate? Yes; Fluid consistency: Thin  Diet effective now                     Antimicrobial agents: Anti-infectives (From admission, onward)    Start     Dose/Rate Route Frequency Ordered Stop   11/08/23 1815  vancomycin (VANCOREADY) IVPB 1500 mg/300 mL  1,500 mg 150 mL/hr over 120 Minutes Intravenous  Once 11/08/23 1804 11/08/23 2224   11/08/23 1800  vancomycin (VANCOCIN) IVPB 1000 mg/200 mL premix  Status:  Discontinued        1,000 mg 200 mL/hr over 60 Minutes Intravenous  Once 11/08/23 1754 11/08/23 1803   11/08/23 1800  ceFEPIme (MAXIPIME) 2 g in sodium chloride 0.9 % 100 mL IVPB        2 g 200 mL/hr over 30 Minutes Intravenous  Once 11/08/23 1754 11/08/23 1934        MEDICATIONS: Scheduled Meds:  feeding supplement  237 mL  Oral BID BM   hydrocortisone  10 mg Oral QPM   hydrocortisone  20 mg Oral BH-q7a   loratadine  10 mg Oral Daily   rosuvastatin  20 mg Oral QPM   Continuous Infusions:  heparin 850 Units/hr (11/10/23 0647)   PRN Meds:.acetaminophen **OR** acetaminophen   I have personally reviewed following labs and imaging studies  LABORATORY DATA: CBC: Recent Labs  Lab 11/08/23 1812 11/09/23 0245 11/10/23 0455  WBC 5.3 5.1 5.1  NEUTROABS 3.0  --   --   HGB 12.1 12.1 10.8*  HCT 38.2 39.2 32.6*  MCV 96.7 97.5 93.7  PLT 279 233 250    Basic Metabolic Panel: Recent Labs  Lab 11/08/23 1812 11/09/23 0245  NA 137 137  K 4.1 3.7  CL 97* 101  CO2 23 25  GLUCOSE 120* 102*  BUN 24* 22  CREATININE 1.72* 1.59*  CALCIUM 9.8 9.4    GFR: Estimated Creatinine Clearance: 23.3 mL/min (A) (by C-G formula based on SCr of 1.59 mg/dL (H)).  Liver Function Tests: Recent Labs  Lab 11/08/23 1812 11/09/23 0245  AST 52* 32  ALT 27 19  ALKPHOS 59 55  BILITOT 0.6 1.0  PROT 6.9 6.6  ALBUMIN 4.3 3.9   No results for input(s): "LIPASE", "AMYLASE" in the last 168 hours. No results for input(s): "AMMONIA" in the last 168 hours.  Coagulation Profile: Recent Labs  Lab 11/08/23 1812  INR 0.9    Cardiac Enzymes: No results for input(s): "CKTOTAL", "CKMB", "CKMBINDEX", "TROPONINI" in the last 168 hours.  BNP (last 3 results) No results for input(s): "PROBNP" in the last 8760 hours.  Lipid Profile: No results for input(s): "CHOL", "HDL", "LDLCALC", "TRIG", "CHOLHDL", "LDLDIRECT" in the last 72 hours.  Thyroid Function Tests: No results for input(s): "TSH", "T4TOTAL", "FREET4", "T3FREE", "THYROIDAB" in the last 72 hours.  Anemia Panel: No results for input(s): "VITAMINB12", "FOLATE", "FERRITIN", "TIBC", "IRON", "RETICCTPCT" in the last 72 hours.  Urine analysis:    Component Value Date/Time   COLORURINE YELLOW 10/01/2023 1919   APPEARANCEUR CLEAR 10/01/2023 1919   LABSPEC 1.012  10/01/2023 1919   PHURINE 5.0 10/01/2023 1919   GLUCOSEU NEGATIVE 10/01/2023 1919   HGBUR NEGATIVE 10/01/2023 1919   BILIRUBINUR NEGATIVE 10/01/2023 1919   KETONESUR NEGATIVE 10/01/2023 1919   PROTEINUR NEGATIVE 10/01/2023 1919   UROBILINOGEN 0.2 03/01/2015 2045   NITRITE NEGATIVE 10/01/2023 1919   LEUKOCYTESUR NEGATIVE 10/01/2023 1919    Sepsis Labs: Lactic Acid, Venous    Component Value Date/Time   LATICACIDVEN 1.3 11/08/2023 2220    MICROBIOLOGY: Recent Results (from the past 240 hours)  Resp panel by RT-PCR (RSV, Flu A&B, Covid) Anterior Nasal Swab     Status: None   Collection Time: 11/08/23  5:51 PM   Specimen: Anterior Nasal Swab  Result Value Ref Range Status   SARS Coronavirus 2 by RT PCR NEGATIVE  NEGATIVE Final   Influenza A by PCR NEGATIVE NEGATIVE Final   Influenza B by PCR NEGATIVE NEGATIVE Final    Comment: (NOTE) The Xpert Xpress SARS-CoV-2/FLU/RSV plus assay is intended as an aid in the diagnosis of influenza from Nasopharyngeal swab specimens and should not be used as a sole basis for treatment. Nasal washings and aspirates are unacceptable for Xpert Xpress SARS-CoV-2/FLU/RSV testing.  Fact Sheet for Patients: BloggerCourse.com  Fact Sheet for Healthcare Providers: SeriousBroker.it  This test is not yet approved or cleared by the [place] FDA and has been authorized for detection and/or diagnosis of SARS-CoV-2 by FDA under an Emergency Use Authorization (EUA). This EUA will remain in effect (meaning this test can be used) for the duration of the COVID-19 declaration under Section 564(b)(1) of the Act, 21 U.S.C. section 360bbb-3(b)(1), unless the authorization is terminated or revoked.     Resp Syncytial Virus by PCR NEGATIVE NEGATIVE Final    Comment: (NOTE) Fact Sheet for Patients: BloggerCourse.com  Fact Sheet for Healthcare  Providers: SeriousBroker.it  This test is not yet approved or cleared by the [place] FDA and has been authorized for detection and/or diagnosis of SARS-CoV-2 by FDA under an Emergency Use Authorization (EUA). This EUA will remain in effect (meaning this test can be used) for the duration of the COVID-19 declaration under Section 564(b)(1) of the Act, 21 U.S.C. section 360bbb-3(b)(1), unless the authorization is terminated or revoked.  Performed at [facility], [address] [place], [place] [address]   Blood Culture (routine x 2)     Status: None (Preliminary result)   Collection Time: 11/08/23  6:10 PM   Specimen: BLOOD  Result Value Ref Range Status   Specimen Description BLOOD LEFT ANTECUBITAL  Final   Special Requests   Final    BOTTLES DRAWN AEROBIC AND ANAEROBIC Blood Culture adequate volume   Culture   Final    NO GROWTH 2 DAYS Performed at [facility], [address] [place], [place] [address]    Report Status PENDING  Incomplete  Blood Culture (routine x 2)     Status: None (Preliminary result)   Collection Time: 11/08/23 10:11 PM   Specimen: BLOOD  Result Value Ref Range Status   Specimen Description BLOOD BLOOD LEFT ARM  Final   Special Requests   Final    BOTTLES DRAWN AEROBIC AND ANAEROBIC Blood Culture results may not be optimal due to an inadequate volume of blood received in culture bottles   Culture   Final    NO GROWTH 2 DAYS Performed at [facility], [address], [place], [place] [address]    Report Status PENDING  Incomplete    RADIOLOGY STUDIES/RESULTS: VAS [place] LOWER EXTREMITY VENOUS (DVT) Result Date: 11/09/2023  Lower Venous DVT Study Patient Name:  JAELIE AGUILERA  Date of Exam:   11/09/2023 Medical Rec #: [id number]          Accession #:    [id number] Date of Birth: 1935/01/13          Patient Gender: F Patient Age:   56 years Exam Location:  [facility] Procedure:      VAS [place] LOWER  EXTREMITY VENOUS (DVT) Referring Phys: Eulah Pont SUNDIL --------------------------------------------------------------------------------  Indications: Swelling, Edema, SOB, and stroke.  Anticoagulation: Plavix. Limitations: Involuntary patient movement. Comparison Study: No prior exam. Performing Technologist: Fernande Bras  Examination Guidelines: A complete evaluation includes B-mode imaging, spectral Doppler, color Doppler, and power Doppler  as needed of all accessible portions of each vessel. Bilateral testing is considered an integral part of a complete examination. Limited examinations for reoccurring indications may be performed as noted. The reflux portion of the exam is performed with the patient in reverse Trendelenburg.  +---------+---------------+---------+-----------+----------+--------------+ RIGHT    CompressibilityPhasicitySpontaneityPropertiesThrombus Aging +---------+---------------+---------+-----------+----------+--------------+ CFV      Partial        Yes      Yes                                 +---------+---------------+---------+-----------+----------+--------------+ SFJ      None           No       Yes                                 +---------+---------------+---------+-----------+----------+--------------+ FV Prox  None           No       No                                  +---------+---------------+---------+-----------+----------+--------------+ FV Mid   Partial        Yes      Yes                                 +---------+---------------+---------+-----------+----------+--------------+ FV DistalNone           No       No                                  +---------+---------------+---------+-----------+----------+--------------+ PFV      None           Yes      Yes                                 +---------+---------------+---------+-----------+----------+--------------+ POP      None           No       No                                   +---------+---------------+---------+-----------+----------+--------------+ PTV      Partial        No       No                                  +---------+---------------+---------+-----------+----------+--------------+ PERO     Partial        No       No                                  +---------+---------------+---------+-----------+----------+--------------+ EIV                     Yes      Yes                                 +---------+---------------+---------+-----------+----------+--------------+   +---------+---------------+---------+-----------+----------+--------------+  LEFT     CompressibilityPhasicitySpontaneityPropertiesThrombus Aging +---------+---------------+---------+-----------+----------+--------------+ CFV      Full           Yes      Yes                                 +---------+---------------+---------+-----------+----------+--------------+ SFJ      Full           Yes      Yes                                 +---------+---------------+---------+-----------+----------+--------------+ FV Prox  Full                                                        +---------+---------------+---------+-----------+----------+--------------+ FV Mid   Full                                                        +---------+---------------+---------+-----------+----------+--------------+ FV DistalFull                                                        +---------+---------------+---------+-----------+----------+--------------+ PFV      Full                                                        +---------+---------------+---------+-----------+----------+--------------+ POP      Full           Yes      Yes                                 +---------+---------------+---------+-----------+----------+--------------+ PTV      Full                                                         +---------+---------------+---------+-----------+----------+--------------+ PERO     None           No       No                                  +---------+---------------+---------+-----------+----------+--------------+     Summary: RIGHT: - Findings consistent with acute deep vein thrombosis involving the right common femoral vein, SF junction, right proximal profunda vein, right femoral vein, right popliteal vein, right posterior tibial veins, and right peroneal veins.  - No cystic structure found in the popliteal fossa.  LEFT: - Findings consistent with acute  deep vein thrombosis involving the left peroneal veins.  - No cystic structure found in the popliteal fossa.  *See table(s) above for measurements and observations. Electronically signed by Coral Else MD on 11/09/2023 at 7:00:35 PM.    Final    ECHOCARDIOGRAM COMPLETE Result Date: 11/09/2023    ECHOCARDIOGRAM REPORT   Patient Name:   JOLITA HAEFNER Date of Exam: 11/09/2023 Medical Rec #:  [id number]         Height:       66.0 in Accession #:    [id number]        Weight:       165.0 lb Date of Birth:  04/21/1935         BSA:          1.843 m Patient Age:    89 years          BP:           113/66 mmHg Patient Gender: F                 HR:           75 bpm. Exam Location:  Inpatient Procedure: 2D Echo, Cardiac Doppler and Color Doppler (Both Spectral and Color            Flow Doppler were utilized during procedure). Indications:     Elevated Troponin  History:         Patient has prior history of Echocardiogram examinations, most                  recent 07/31/2023. CAD; Risk Factors:Hypertension and                  Dyslipidemia.  Sonographer:     Geoffery Spruce, FE, PE Referring Phys:  [id number] Tereasa Coop Diagnosing Phys: Orpah Cobb MD  Sonographer Comments: Suboptimal parasternal window and Technically difficult study due to poor echo windows. IMPRESSIONS  1. Left ventricular ejection fraction, by estimation, is 50 to 55%. The left  ventricle has low normal function. The left ventricle has no regional wall motion abnormalities. Left ventricular diastolic parameters are consistent with Grade I diastolic dysfunction (impaired relaxation).  2. Right ventricular systolic function is mildly reduced. The right ventricular size is normal.  3. Left atrial size was mildly dilated.  4. The mitral valve is degenerative. Mild mitral valve regurgitation. Moderate mitral annular calcification.  5. The aortic valve is tricuspid. There is moderate calcification of the aortic valve. There is mild thickening of the aortic valve. Aortic valve regurgitation is mild. Mild aortic valve stenosis.  6. The inferior vena cava is normal in size with greater than 50% respiratory variability, suggesting right atrial pressure of 3 mmHg. FINDINGS  Left Ventricle: Left ventricular ejection fraction, by estimation, is 50 to 55%. The left ventricle has low normal function. The left ventricle has no regional wall motion abnormalities. The left ventricular internal cavity size was normal in size. There is borderline concentric left ventricular hypertrophy. Left ventricular diastolic parameters are consistent with Grade I diastolic dysfunction (impaired relaxation). Right Ventricle: The right ventricular size is normal. No increase in right ventricular wall thickness. Right ventricular systolic function is mildly reduced. Left Atrium: Left atrial size was mildly dilated. Right Atrium: Right atrial size was normal in size. Pericardium: There is no evidence of pericardial effusion. Mitral Valve: The mitral valve is degenerative in appearance. There is mild thickening of the mitral valve leaflet(s). Moderate mitral annular calcification. Mild mitral  valve regurgitation. Tricuspid Valve: The tricuspid valve is normal in structure. Tricuspid valve regurgitation is trivial. Aortic Valve: The aortic valve is tricuspid. There is moderate calcification of the aortic valve. There is mild  thickening of the aortic valve. There is mild to moderate aortic valve annular calcification. Aortic valve regurgitation is mild. Mild aortic stenosis is present. Aortic valve mean gradient measures 5.5 mmHg. Aortic valve peak gradient measures 9.6 mmHg. Aortic valve area, by VTI measures 1.56 cm. Pulmonic Valve: The pulmonic valve was normal in structure. Pulmonic valve regurgitation is not visualized. Aorta: The aortic root is normal in size and structure. Venous: The inferior vena cava is normal in size with greater than 50% respiratory variability, suggesting right atrial pressure of 3 mmHg. IAS/Shunts: The atrial septum is grossly normal.  LEFT VENTRICLE PLAX 2D LVIDd:         3.70 cm   Diastology LVIDs:         2.70 cm   LV e' medial:    5.75 cm/s LV PW:         1.00 cm   LV E/e' medial:  9.6 LV IVS:        1.00 cm   LV e' lateral:   5.44 cm/s LVOT diam:     2.00 cm   LV E/e' lateral: 10.1 LV SV:         45 LV SV Index:   24 LVOT Area:     3.14 cm  RIGHT VENTRICLE RV S prime:     11.20 cm/s LEFT ATRIUM             Index        RIGHT ATRIUM           Index LA Vol (A2C):   41.6 ml 22.57 ml/m  RA Area:     10.30 cm LA Vol (A4C):   29.7 ml 16.12 ml/m  RA Volume:   19.60 ml  10.64 ml/m LA Biplane Vol: 35.0 ml 18.99 ml/m  AORTIC VALVE AV Area (Vmax):    1.52 cm AV Area (Vmean):   1.39 cm AV Area (VTI):     1.56 cm AV Vmax:           155.00 cm/s AV Vmean:          108.000 cm/s AV VTI:            0.288 m AV Peak Grad:      9.6 mmHg AV Mean Grad:      5.5 mmHg LVOT Vmax:         75.00 cm/s LVOT Vmean:        47.900 cm/s LVOT VTI:          0.143 m LVOT/AV VTI ratio: 0.50  AORTA Ao Root diam: 2.90 cm MITRAL VALVE MV Area (PHT): 3.27 cm    SHUNTS MV Decel Time: 232 msec    Systemic VTI:  0.14 m MV E velocity: 55.20 cm/s  Systemic Diam: 2.00 cm MV A velocity: 85.40 cm/s MV E/A ratio:  0.65 Orpah Cobb MD Electronically signed by Orpah Cobb MD Signature Date/Time: 11/09/2023/3:35:41 PM    Final    NM Pulmonary  Perfusion Addendum Date: 11/09/2023 ADDENDUM REPORT: 11/09/2023 11:32 ADDENDUM: Critical Value/emergent results were called by telephone at the time of interpretation on 11/09/2023 at 11:32 am to provider Dr. Jerral Ralph, who verbally acknowledged these results. Electronically Signed   By: Kennith Center M.D.   On: 11/09/2023 11:32   Result Date:  11/09/2023 CLINICAL DATA:  Acute hypoxic respiratory failure. Elevated D-dimer. EXAM: NUCLEAR MEDICINE PERFUSION LUNG SCAN TECHNIQUE: Perfusion images were obtained in multiple projections after intravenous injection of radiopharmaceutical. Ventilation scans intentionally deferred if perfusion scan and chest x-ray adequate for interpretation during COVID 19 epidemic. RADIOPHARMACEUTICALS:  4.3 mCi Tc-12m MAA IV COMPARISON:  Chest x-ray 11/08/2023. Previous nuclear medicine pulmonary perfusion study 09/12/2023. FINDINGS: Since the previous perfusion study of 2 months ago, the patient has developed bilateral segmental wedge-shaped peripheral perfusion defects in both lungs. Review of yesterday's chest x-ray reveals no pulmonary opacities in these regions. IMPRESSION: Imaging features consistent with pulmonary embolism present. Perfusion findings are new since prior study of 09/12/2023. Electronically Signed: By: Kennith Center M.D. On: 11/09/2023 11:18   CT HEAD WO CONTRAST ( ) Result Date: 11/08/2023 CLINICAL DATA:  Syncope EXAM: CT HEAD WITHOUT CONTRAST TECHNIQUE: Contiguous axial images were obtained from the base of the skull through the vertex without intravenous contrast. RADIATION DOSE REDUCTION: This exam was performed according to the departmental dose-optimization program which includes automated exposure control, adjustment of the mA and/or kV according to patient size and/or use of iterative reconstruction technique. COMPARISON:  10/01/2023 FINDINGS: Brain: Normal anatomic configuration. Parenchymal volume loss is commensurate with the patient's age. Stable moderate  periventricular white matter changes are present likely reflecting the sequela of small vessel ischemia. No abnormal intra or extra-axial mass lesion or fluid collection. No abnormal mass effect or midline shift. No evidence of acute intracranial hemorrhage or infarct. Ventricular size is normal. Cerebellum unremarkable. Vascular: No asymmetric hyperdense vasculature at the skull base. Skull: Intact Sinuses/Orbits: Paranasal sinuses are clear. Orbits are unremarkable. Other: Mastoid air cells and middle ear cavities are clear. IMPRESSION: 1. No acute intracranial hemorrhage or infarct. 2. Stable senescent change. Electronically Signed   By: Helyn Numbers M.D.   On: 11/08/2023 23:19   DG Chest Port 1 View Result Date: 11/08/2023 CLINICAL DATA:  Sepsis.  Low O2 sat EXAM: PORTABLE CHEST 1 VIEW COMPARISON:  X-ray 10/01/2023 and older FINDINGS: Hyperinflation. Chronic lung changes. There is some bandlike changes at the left lung base. Increased from previous. Favor atelectasis. Recommend follow-up. No pneumothorax, effusion or edema. Normal cardiopericardial silhouette calcified aorta. Calcified tracheobronchial tree. Fixation hardware at the edge of the imaging field along the lower cervical spine. IMPRESSION: Hyperinflation with chronic changes. Increasing bandlike opacity left lung base such as atelectasis. Electronically Signed   By: Karen Kays M.D.   On: 11/08/2023 18:51     LOS: 2 days   Jeoffrey Massed, MD  [facility]    To contact the attending provider between 7A-7P or the covering provider during after hours 7P-7A, please log into the web site [email or web address] and access using universal [place] password for that web site. If you do not have the password, please call the hospital operator.  11/10/2023, 10:28 AM

## 2023-11-10 NOTE — Progress Notes (Signed)
 PHARMACY - ANTICOAGULATION CONSULT NOTE  Pharmacy Consult for IV heparin Indication: pulmonary embolus  Allergies  Allergen Reactions   Codeine Nausea And Vomiting    Tolerates hydrocodone    Patient Measurements: Height: 5\' 6"  (167.6 cm) Weight: 70.3 kg (154 lb 15.7 oz) IBW/kg (Calculated) : 59.3 HEPARIN DW (KG): 74.3  Vital Signs: Temp: 97.4 F (36.3 C) (04/03 0456) Temp Source: Oral (04/03 0456) BP: 122/69 (04/03 0500) Pulse Rate: 73 (04/03 0500)  Labs: Recent Labs    11/08/23 1812 11/09/23 0245 11/09/23 0511 11/09/23 0744 11/09/23 0925 11/09/23 1240 11/09/23 1634 11/09/23 2103 11/10/23 0455  HGB 12.1 12.1  --   --   --   --   --   --  10.8*  HCT 38.2 39.2  --   --   --   --   --   --  32.6*  PLT 279 233  --   --   --   --   --   --  250  APTT 26  --   --   --   --   --   --   --   --   LABPROT 12.7  --   --   --   --   --   --   --   --   INR 0.9  --   --   --   --   --   --   --   --   HEPARINUNFRC  --   --   --  1.09*  --   --   --  0.63 0.85*  CREATININE 1.72* 1.59*  --   --   --   --   --   --   --   TROPONINIHS  --  652*   < > 754* 529* 233* 270*  --   --    < > = values in this interval not displayed.    Estimated Creatinine Clearance: 23.3 mL/min (A) (by C-G formula based on SCr of 1.59 mg/dL (H)).   Medical History: Past Medical History:  Diagnosis Date   Acute respiratory failure with hypoxia (HCC) 11/15/2017   AKI (acute kidney injury) (HCC) 11/15/2017   Arthritis    "all over" (11/14/2017)   Brain tumor (HCC)    "I still have it"; not cancer (11/14/2017)   Chest pain, atypical 09/11/2021   Chronic lower back pain    Community acquired pneumonia 02/14/2023   Coronary artery disease    Dr. Algie Coffer   Elevated CK 11/15/2017   GERD (gastroesophageal reflux disease)    History of blood transfusion    "don't remember why" (11/14/2017)   History of radiation therapy 07/03/2018   brain, pituitary/ 12.5 Gy in 1 fraction   Hypercholesteremia     Hypertension    Influenza A 11/14/2017   MI (myocardial infarction) (HCC) 2006   PONV (postoperative nausea and vomiting)    Skin cancer    "right forearm"    Assessment: Adriana Jordan is a 89 y.o. year old female admitted on 11/08/2023 with acute respiratory failure and concern for PE. No anticoagulation prior to admission. Pharmacy consulted to dose heparin.   4/3: consulted for heparin to apix    Goal of Therapy:  Heparin level 0.3-0.7 units/ml Monitor platelets by anticoagulation protocol: Yes   Plan:  STOP heparin infusion START apixaban 10 mg BID x 7 days, followed by 5 mg BID- administer first dose at the same time heparin infusion  is stopped  Plan communicated to primary RN    Jani Gravel, PharmD Clinical Pharmacist  11/10/2023 10:51 AM

## 2023-11-10 NOTE — Evaluation (Signed)
 Physical Therapy Evaluation Patient Details Name: Adriana Jordan MRN: [id number] DOB: 05/17/1935 Today's Date: 11/10/2023  History of Present Illness  Patient is a 89 year old female admitted with hypotension and generalized weakness on 10/01/23 after recent discharge from hospital with respiratory failure and pneumonia. History of  HTN, HLD, CAD, CVA, syncope, GERD, pituitary adenoma, adrenal insufficiency  Clinical Impression  Pt admitted with above diagnosis. PTA she was active, walking regularly for exercise. She is a bit weaker than baseline at time of my evaluation. She was able to stand up from the bed with effort, and RW to stabilize. Once up however, able to ambulate at a supervision level, some support from RW, slight antalgic pattern >200 feet. Mild dyspnea but unable to obtain pleth reading for SpO2. RN notified. Orthostatics recorded before activity (see vitals tab.) Pt has 24/7 assist available at home from her son. Pt currently with functional limitations due to the deficits listed below (see PT Problem List). Pt will benefit from acute skilled PT to increase their independence and safety with mobility to allow discharge.           If plan is discharge home, recommend the following: A little help with bathing/dressing/bathroom;Assistance with cooking/housework;Assist for transportation;Help with stairs or ramp for entrance   Can travel by private vehicle        Equipment Recommendations None recommended by PT  Recommendations for Other Services       Functional Status Assessment Patient has had a recent decline in their functional status and demonstrates the ability to make significant improvements in function in a reasonable and predictable amount of time.     Precautions / Restrictions Precautions Precautions: Fall Restrictions Weight Bearing Restrictions Per Provider Order: No      Mobility  Bed Mobility Overal bed mobility: Modified Independent              General bed mobility comments: extra time, no assist needed to rise    Transfers Overall transfer level: Needs assistance Equipment used: Rolling walker (2 wheels) Transfers: Sit to/from Stand Sit to Stand: Contact guard assist           General transfer comment: CGA for safety, low bed setting. rocks for [employer], cues for hand placement. Performed x2 from bed today with RW for support upon standing.    Ambulation/Gait Ambulation/Gait assistance: Supervision Gait Distance (Feet): 275 Feet Assistive device: Rolling walker (2 wheels) Gait Pattern/deviations: Step-through pattern, Decreased stride length, Antalgic Gait velocity: Dec Gait velocity interpretation: 1.31 - 2.62 ft/sec, indicative of limited community ambulator   General Gait Details: Grossly stable with RW for support, fair reliance with slight antalgic pattern but no buckling, no overt LOB. Educated on findings, safety, and awareness. Agreeable to use RW v SPC at home. Unable to obtain pleth for SpO2 - mild dyspnea.  Stairs            Wheelchair Mobility     Tilt Bed    Modified Rankin (Stroke Patients Only)       Balance Overall balance assessment: Mild deficits observed, not formally tested                                           Pertinent Vitals/Pain Pain Assessment Pain Assessment: No/denies pain    Home Living Family/patient expects to be discharged to:: Private residence Living Arrangements: Children Available Help at Discharge:  Family;Available 24 hours/day Type of Home: House Home Access: Stairs to enter Entrance Stairs-Rails: Right Entrance Stairs-Number of Steps: 3   Home Layout: One level Home Equipment: Agricultural consultant (2 wheels);Cane - single point;BSC/3in1 Additional Comments: Pt lives with son who is available 24/7    Prior Function Prior Level of Function : Needs assist             Mobility Comments: uses cane more than RW. ADLs Comments: Pt  states independent, but son does most cooking/cleaning.     Extremity/Trunk Assessment   Upper Extremity Assessment Upper Extremity Assessment: Defer to OT evaluation    Lower Extremity Assessment Lower Extremity Assessment: Generalized weakness       Communication   Communication Communication: No apparent difficulties    Cognition Arousal: Alert Behavior During Therapy: WFL for tasks assessed/performed   PT - Cognitive impairments: No apparent impairments                         Following commands: Intact       Cueing Cueing Techniques: Verbal cues     General Comments General comments (skin integrity, edema, etc.): See orthostatics tab for details (SpO2 92-95% on RA at rest.)    Exercises     Assessment/Plan    PT Assessment Patient needs continued PT services  PT Problem List Decreased strength;Decreased activity tolerance;Decreased balance;Decreased mobility;Decreased knowledge of use of DME;Cardiopulmonary status limiting activity       PT Treatment Interventions DME instruction;Gait training;Stair training;Functional mobility training;Therapeutic activities;Balance training;Therapeutic exercise;Neuromuscular re-education;Patient/family education    PT Goals (Current goals can be found in the Care Plan section)  Acute Rehab PT Goals Patient Stated Goal: Get well go home PT Goal Formulation: With patient Time For Goal Achievement: 11/24/23 Potential to Achieve Goals: Good    Frequency Min 2X/week     Co-evaluation               AM-PAC PT "6 Clicks" Mobility  Outcome Measure Help needed turning from your back to your side while in a flat bed without using bedrails?: None Help needed moving from lying on your back to sitting on the side of a flat bed without using bedrails?: None Help needed moving to and from a bed to a chair (including a wheelchair)?: A Little Help needed standing up from a chair using your arms (e.g., wheelchair or  bedside chair)?: A Little Help needed to walk in hospital room?: A Little Help needed climbing 3-5 steps with a railing? : A Little 6 Click Score: 20    End of Session Equipment Utilized During Treatment: Gait belt Activity Tolerance: Patient tolerated treatment well Patient left: in chair;with call bell/phone within reach;with chair alarm set Nurse Communication: Mobility status PT Visit Diagnosis: Other abnormalities of gait and mobility (R26.89);Muscle weakness (generalized) (M62.81)    Time: 8119-1478 PT Time Calculation (min) (ACUTE ONLY): 25 min   Charges:   PT Evaluation $PT Eval Low Complexity: 1 Low PT Treatments $Gait Training: 8-22 mins PT General Charges $$ ACUTE PT VISIT: 1 Visit         .lsb   Berton Mount 11/10/2023, 1:57 PM

## 2023-11-10 NOTE — TOC Initial Note (Signed)
 Transition of Care [facility]) - Initial/Assessment Note    Patient Details  Name: Adriana Jordan MRN: [id number] Date of Birth: 12-27-1934  [facility]) CM/SW Contact:    Lamonte Sakai, Student-Social Work Phone Number: 11/10/2023, 8:59 AM  Clinical Narrative:                  Pt admitted from home due to syncopal episode. No current TOC needs, please place consult as needs arise following therapy eval.       Patient Goals and CMS Choice            Expected Discharge Plan and Services       Living arrangements for the past 2 months: Single Family Home                                      Prior Living Arrangements/Services Living arrangements for the past 2 months: Single Family Home                     Activities of Daily Living   ADL Screening (condition at time of admission) Independently performs ADLs?: Yes (appropriate for developmental age) Is the patient deaf or have difficulty hearing?: No Does the patient have difficulty seeing, even when wearing glasses/contacts?: No Does the patient have difficulty concentrating, remembering, or making decisions?: No  Permission Sought/Granted                  Emotional Assessment       Orientation: : Oriented to Self, Oriented to Place, Oriented to Situation      Admission diagnosis:  Acute hypoxemic respiratory failure (HCC) [J96.01] Dyspnea, unspecified type [R06.00] Pneumonia due to infectious organism, unspecified laterality, unspecified part of lung [J18.9] Patient Active Problem List   Diagnosis Date Noted   Hypothermia 11/09/2023   Acute hypoxemic respiratory failure (HCC) 11/08/2023   Pneumonia 09/27/2023   CAD S/P percutaneous coronary angioplasty 09/12/2023   Hypotension 09/12/2023   Near syncope 09/12/2023   Syncope 07/30/2023   Headache 06/21/2022   Adrenal insufficiency (HCC) 03/05/2022   Protein-calorie malnutrition, severe 03/03/2022   Hypokalemia 03/01/2022    Generalized weakness 03/01/2022   Protein-calorie malnutrition, moderate (HCC) 03/01/2022   Multiple falls 03/01/2022   Tick bite 03/01/2022   Hyponatremia 02/21/2022   History of CVA (cerebrovascular accident) 10/11/2019   Leukopenia 11/15/2017   AKI (acute kidney injury) (HCC) 11/15/2017   Hypertension 11/14/2017   Hyperlipidemia 11/14/2017   GERD (gastroesophageal reflux disease) 11/14/2017   Pituitary adenoma (HCC) 04/07/2015   PCP:  Orpah Cobb, MD Pharmacy:   [facility] - [place] [place] - [address] [place] [address] Phone: [phone] Fax: [phone]  [facility] [address] [place] [address] Phone: [phone] Fax: [phone]     Social Drivers of Health (SDOH) Social History: SDOH Screenings   Food Insecurity: No Food Insecurity (11/08/2023)  Housing: Low Risk  (11/08/2023)  Transportation Needs: No Transportation Needs (11/08/2023)  Utilities: Not At Risk (11/08/2023)  Social Connections: Moderately Integrated (11/08/2023)  Tobacco Use: Medium Risk (11/08/2023)   SDOH Interventions:     Readmission Risk Interventions    02/15/2023    1:22 PM  Readmission Risk Prevention Plan  Post Dischage Appt Complete  Medication Screening Complete  Transportation Screening Complete

## 2023-11-10 NOTE — TOC Benefit Eligibility Note (Signed)
 Pharmacy Patient Advocate Encounter  Insurance verification completed.    The patient is insured through [employer]. Patient has Medicare and is not eligible for a copay card, but may be able to apply for patient assistance or Medicare RX Payment Plan (Patient Must reach out to their plan, if eligible for payment plan), if available.    Ran test claim for Eliquis starter pack and the current 30 day co-pay is $12.15.   This test claim was processed through [facility]- copay amounts may vary at other pharmacies due to pharmacy/plan contracts, or as the patient moves through the different stages of their insurance plan.

## 2023-11-11 ENCOUNTER — Other Ambulatory Visit (HOSPITAL_COMMUNITY): Payer: Self-pay

## 2023-11-11 DIAGNOSIS — N179 Acute kidney failure, unspecified: Secondary | ICD-10-CM | POA: Diagnosis not present

## 2023-11-11 DIAGNOSIS — R55 Syncope and collapse: Secondary | ICD-10-CM | POA: Diagnosis not present

## 2023-11-11 DIAGNOSIS — E2740 Unspecified adrenocortical insufficiency: Secondary | ICD-10-CM | POA: Diagnosis not present

## 2023-11-11 DIAGNOSIS — J9601 Acute respiratory failure with hypoxia: Secondary | ICD-10-CM | POA: Diagnosis not present

## 2023-11-11 LAB — CBC
HCT: 29.9 % — ABNORMAL LOW (ref 36.0–46.0)
Hemoglobin: 9.7 g/dL — ABNORMAL LOW (ref 12.0–15.0)
MCH: 30.3 pg (ref 26.0–34.0)
MCHC: 32.4 g/dL (ref 30.0–36.0)
MCV: 93.4 fL (ref 80.0–100.0)
Platelets: 255 10*3/uL (ref 150–400)
RBC: 3.2 MIL/uL — ABNORMAL LOW (ref 3.87–5.11)
RDW: 15.6 % — ABNORMAL HIGH (ref 11.5–15.5)
WBC: 4.1 10*3/uL (ref 4.0–10.5)
nRBC: 0 % (ref 0.0–0.2)

## 2023-11-11 MED ORDER — APIXABAN 5 MG PO TABS
ORAL_TABLET | ORAL | 2 refills | Status: AC
  Filled 2023-11-11: qty 90, 38d supply, fill #0

## 2023-11-11 MED ORDER — ROSUVASTATIN CALCIUM 10 MG PO TABS
10.0000 mg | ORAL_TABLET | Freq: Every evening | ORAL | 1 refills | Status: AC
  Filled 2023-11-11: qty 30, 30d supply, fill #0

## 2023-11-11 MED ORDER — HYDROCORTISONE 20 MG PO TABS
20.0000 mg | ORAL_TABLET | ORAL | 1 refills | Status: DC
  Filled 2023-11-11: qty 30, 30d supply, fill #0

## 2023-11-11 MED ORDER — HYDROCORTISONE 10 MG PO TABS
10.0000 mg | ORAL_TABLET | Freq: Three times a day (TID) | ORAL | 1 refills | Status: DC
  Filled 2023-11-11: qty 90, 30d supply, fill #0
  Filled 2023-11-11: qty 30, 30d supply, fill #0

## 2023-11-11 MED ORDER — TORSEMIDE 10 MG PO TABS
10.0000 mg | ORAL_TABLET | Freq: Every day | ORAL | 1 refills | Status: DC | PRN
  Filled 2023-11-11: qty 30, 30d supply, fill #0

## 2023-11-11 MED ORDER — CETIRIZINE HCL 10 MG PO TABS
10.0000 mg | ORAL_TABLET | Freq: Every day | ORAL | 1 refills | Status: AC
  Filled 2023-11-11: qty 30, 30d supply, fill #0

## 2023-11-11 NOTE — TOC Transition Note (Addendum)
 Transition of Care [facility]) - Discharge Note   Patient Details  Name: LARESSA BOLINGER MRN: [id number] Date of Birth: 25-Dec-1934  Transition [facility]) CM/SW Contact:  Gordy Clement, RN Phone Number: 11/11/2023, 9:13 AM   Clinical Narrative:     Patient will DC to home today. OPPT has been referred to [facility] location. RNCM has spoken with the Son and notified him of the OPPT arranged. Son will transport and states he will be here by noon. No DME needed  No additional TOC needs           Patient Goals and CMS Choice            Discharge Placement                       Discharge Plan and Services Additional resources added to the After Visit Summary for                                       Social Drivers of Health (SDOH) Interventions SDOH Screenings   Food Insecurity: No Food Insecurity (11/08/2023)  Housing: Low Risk  (11/08/2023)  Transportation Needs: No Transportation Needs (11/08/2023)  Utilities: Not At Risk (11/08/2023)  Social Connections: Moderately Integrated (11/08/2023)  Tobacco Use: Medium Risk (11/08/2023)     Readmission Risk Interventions    02/15/2023    1:22 PM  Readmission Risk Prevention Plan  Post Dischage Appt Complete  Medication Screening Complete  Transportation Screening Complete

## 2023-11-11 NOTE — Discharge Summary (Addendum)
 PATIENT DETAILS Name: Adriana Jordan Age: 89 y.o. Sex: female Date of Birth: 03-01-1935 MRN: [id number]. Admitting Physician: John Giovanni, MD WUJ:[id number], [name], MD  Admit Date: 11/08/2023 Discharge date: 11/11/2023  Recommendations for Outpatient Follow-up:  Follow up with PCP in 1-2 weeks Please obtain CMP/CBC in one week  Admitted From:  Home  Disposition: Outpatient physical therapy   Discharge Condition: good  CODE STATUS:   Code Status: Limited: Do not attempt resuscitation (DNR) -DNR-LIMITED -Do Not Intubate/DNI    Diet recommendation:  Diet Order             Diet - low sodium heart healthy           Diet Heart Room service appropriate? Yes; Fluid consistency: Thin  Diet effective now                    Brief Summary: Patient is a 89 y.o.  female with history of CAD, HLD, chronic adrenal insufficiency, CKD stage IIIb who presented with syncopal episode.  Upon further evaluation patient was found to have DVT/PE.   Significant events: 4/1>> admit to [facility]   Significant studies: 4/1>> CT head: No acute abnormalities 4/2>> VQ scan:+v havinge pulmonary embolism 4/2>> B/L lower extremity Doppler:+ve DVT left peroneal vein. 4/2>> Echo: EF 50-55%, grade 1 diastolic dysfunction   Significant microbiology data: 4/1>> COVID/influenza/RSV PCR: Negative 4/1>> blood culture: No growth   Procedures: None   Consults: Cardiology  Brief Hospital Course: Syncope Probably secondary to PE Telemetry stable Echo with stable EF Initially on IV heparin has been transitioned to Eliquis on 4/20-no major issues overnight.   Acute hypoxic respiratory failure Secondary to PE Initially required around 4-5 L of oxygen-has been titrated to room air this morning Continue anticoagulation Volume status stable   Elevated troponins Demand ischemia in the setting of PE. Supportive care Cardiology followed loosely   CKD stage IIIb Creatinine close to  baseline Follow electrolytes periodically   Adrenal insufficiency History of pituitary adenoma-s/p resection/radiation. Hydrocortisone   Chronic HFpEF Euvolemic   HLD statin   Debility/deconditioning PT/OT eval-outpatient PT recommended.   BMI: Estimated body mass index is 25.01 kg/m as calculated from the following:   Height as of this encounter: 5\' 6"  (1.676 m).   Weight as of this encounter: 70.3 kg.   Note-son updated regarding discharge plans on day of discharge.  Discharge Diagnoses:  Principal Problem:   Syncope Active Problems:   AKI (acute kidney injury) (HCC)   Adrenal insufficiency (HCC)   Acute hypoxemic respiratory failure (HCC)   Hypothermia   Discharge Instructions:  Activity:  As tolerated with Full fall precautions use walker/cane & assistance as needed  Discharge Instructions     (HEART FAILURE PATIENTS) Call MD:  Anytime you have any of the following symptoms: 1) 3 pound weight gain in 24 hours or 5 pounds in 1 week 2) shortness of breath, with or without a dry hacking cough 3) swelling in the hands, feet or stomach 4) if you have to sleep on extra pillows at night in order to breathe.   Complete by: As directed    Call MD for:  difficulty breathing, headache or visual disturbances   Complete by: As directed    Call MD for:  persistant nausea and vomiting   Complete by: As directed    Diet - low sodium heart healthy   Complete by: As directed    Discharge instructions   Complete by: As directed  Follow with Primary MD  Orpah Cobb, MD in 1-2 weeks  Please get a complete blood count and chemistry panel checked by your Primary MD at your next visit, and again as instructed by your Primary MD.  Get Medicines reviewed and adjusted: Please take all your medications with you for your next visit with your Primary MD  Laboratory/radiological data: Please request your Primary MD to go over all hospital tests and procedure/radiological results  at the follow up, please ask your Primary MD to get all Hospital records sent to his/her office.  In some cases, they will be blood work, cultures and biopsy results pending at the time of your discharge. Please request that your primary care M.D. follows up on these results.  Also Note the following: If you experience worsening of your admission symptoms, develop shortness of breath, life threatening emergency, suicidal or homicidal thoughts you must seek medical attention immediately by calling 911 or calling your MD immediately  if symptoms less severe.  You must read complete instructions/literature along with all the possible adverse reactions/side effects for all the Medicines you take and that have been prescribed to you. Take any new Medicines after you have completely understood and accpet all the possible adverse reactions/side effects.   Do not drive when taking Pain medications or sleeping medications (Benzodaizepines)  Do not take more than prescribed Pain, Sleep and Anxiety Medications. It is not advisable to combine anxiety,sleep and pain medications without talking with your primary care practitioner  Special Instructions: If you have smoked or chewed Tobacco  in the last 2 yrs please stop smoking, stop any regular Alcohol  and or any Recreational drug use.  Wear Seat belts while driving.  Please note: You were cared for by a hospitalist during your hospital stay. Once you are discharged, your primary care physician will handle any further medical issues. Please note that NO REFILLS for any discharge medications will be authorized once you are discharged, as it is imperative that you return to your primary care physician (or establish a relationship with a primary care physician if you do not have one) for your post hospital discharge needs so that they can reassess your need for medications and monitor your lab values.   Increase activity slowly   Complete by: As directed        Allergies as of 11/11/2023       Reactions   Codeine Nausea And Vomiting   Tolerates hydrocodone        Medication List     STOP taking these medications    aspirin EC 81 MG tablet   ibuprofen 200 MG tablet Commonly known as: ADVIL       TAKE these medications    apixaban 5 MG Tabs tablet Commonly known as: ELIQUIS Take 2 tablets (10 mg) by mouth twice daily-and on Thu 11/17/23-switch to 1 tablet (5 mg) by mouth twice daily   cetirizine 10 MG tablet Commonly known as: ZYRTEC Take 1 tablet (10 mg total) by mouth daily.   diclofenac Sodium 1 % Gel Commonly known as: VOLTAREN Apply 4 g topically as needed (shoulder pain).   feeding supplement Liqd Take 237 mLs by mouth 2 (two) times daily between meals. What changed:  how much to take when to take this reasons to take this   hydrocortisone 10 MG tablet Commonly known as: CORTEF Take 2 tablets (20 mg total) by mouth every morning and 1 tablet (10mg ) by mouth every evening. What changed:  when  to take this Another medication with the same name was removed. Continue taking this medication, and follow the directions you see here.   lidocaine 4 % Place 1 patch onto the skin daily.   rosuvastatin 10 MG tablet Commonly known as: CRESTOR Take 1 tablet (10 mg total) by mouth every evening. What changed:  medication strength how much to take   torsemide 10 MG tablet Commonly known as: DEMADEX Take 1 tablet (10 mg total) by mouth daily as needed (fluid, swelling).        Follow-up Information     Orpah Cobb, MD. Schedule an appointment as soon as possible for a visit in 1 week.   Specialty: Cardiology Contact information: [address] [place] [address] [phone]         [facility] Follow up.   Specialty: Rehabilitation Why: Please call and schedule your outpatient physical therapy Contact information: [address] [place] [address] [phone]               Allergies  Allergen Reactions   Codeine Nausea And Vomiting    Tolerates hydrocodone     Other Procedures/Studies: VAS [place] LOWER EXTREMITY VENOUS (DVT) Result Date: 11/09/2023  Lower Venous DVT Study Patient Name:  KARINE GARN  Date of Exam:   11/09/2023 Medical Rec #: [id number]          Accession #:    [id number] Date of Birth: 1935-04-21          Patient Gender: F Patient Age:   67 years Exam Location:  [facility] Procedure:      VAS [place] LOWER EXTREMITY VENOUS (DVT) Referring Phys: Eulah Pont SUNDIL --------------------------------------------------------------------------------  Indications: Swelling, Edema, SOB, and stroke.  Anticoagulation: Plavix. Limitations: Involuntary patient movement. Comparison Study: No prior exam. Performing Technologist: Fernande Bras  Examination Guidelines: A complete evaluation includes B-mode imaging, spectral Doppler, color Doppler, and power Doppler as needed of all accessible portions of each vessel. Bilateral testing is considered an integral part of a complete examination. Limited examinations for reoccurring indications may be performed as noted. The reflux portion of the exam is performed with the patient in reverse Trendelenburg.  +---------+---------------+---------+-----------+----------+--------------+ RIGHT    CompressibilityPhasicitySpontaneityPropertiesThrombus Aging +---------+---------------+---------+-----------+----------+--------------+ CFV      Partial        Yes      Yes                                 +---------+---------------+---------+-----------+----------+--------------+ SFJ      None           No       Yes                                 +---------+---------------+---------+-----------+----------+--------------+ FV Prox  None           No       No                                   +---------+---------------+---------+-----------+----------+--------------+ FV Mid   Partial        Yes      Yes                                 +---------+---------------+---------+-----------+----------+--------------+  FV DistalNone           No       No                                  +---------+---------------+---------+-----------+----------+--------------+ PFV      None           Yes      Yes                                 +---------+---------------+---------+-----------+----------+--------------+ POP      None           No       No                                  +---------+---------------+---------+-----------+----------+--------------+ PTV      Partial        No       No                                  +---------+---------------+---------+-----------+----------+--------------+ PERO     Partial        No       No                                  +---------+---------------+---------+-----------+----------+--------------+ EIV                     Yes      Yes                                 +---------+---------------+---------+-----------+----------+--------------+   +---------+---------------+---------+-----------+----------+--------------+ LEFT     CompressibilityPhasicitySpontaneityPropertiesThrombus Aging +---------+---------------+---------+-----------+----------+--------------+ CFV      Full           Yes      Yes                                 +---------+---------------+---------+-----------+----------+--------------+ SFJ      Full           Yes      Yes                                 +---------+---------------+---------+-----------+----------+--------------+ FV Prox  Full                                                        +---------+---------------+---------+-----------+----------+--------------+ FV Mid   Full                                                         +---------+---------------+---------+-----------+----------+--------------+ FV DistalFull                                                        +---------+---------------+---------+-----------+----------+--------------+  PFV      Full                                                        +---------+---------------+---------+-----------+----------+--------------+ POP      Full           Yes      Yes                                 +---------+---------------+---------+-----------+----------+--------------+ PTV      Full                                                        +---------+---------------+---------+-----------+----------+--------------+ PERO     None           No       No                                  +---------+---------------+---------+-----------+----------+--------------+     Summary: RIGHT: - Findings consistent with acute deep vein thrombosis involving the right common femoral vein, SF junction, right proximal profunda vein, right femoral vein, right popliteal vein, right posterior tibial veins, and right peroneal veins.  - No cystic structure found in the popliteal fossa.  LEFT: - Findings consistent with acute deep vein thrombosis involving the left peroneal veins.  - No cystic structure found in the popliteal fossa.  *See table(s) above for measurements and observations. Electronically signed by Coral Else MD on 11/09/2023 at 7:00:35 PM.    Final    ECHOCARDIOGRAM COMPLETE Result Date: 11/09/2023    ECHOCARDIOGRAM REPORT   Patient Name:   HAELI GERLICH Date of Exam: 11/09/2023 Medical Rec #:  [id number]         Height:       66.0 in Accession #:    [id number]        Weight:       165.0 lb Date of Birth:  Nov 15, 1934         BSA:          1.843 m Patient Age:    89 years          BP:           113/66 mmHg Patient Gender: F                 HR:           75 bpm. Exam Location:  Inpatient Procedure: 2D Echo, Cardiac Doppler and Color Doppler (Both Spectral and  Color            Flow Doppler were utilized during procedure). Indications:     Elevated Troponin  History:         Patient has prior history of Echocardiogram examinations, most                  recent 07/31/2023. CAD; Risk Factors:Hypertension and                  Dyslipidemia.  Sonographer:  Meagan Baucom RDCS, FE, PE Referring Phys:  [id number] Tereasa Coop Diagnosing Phys: Orpah Cobb MD  Sonographer Comments: Suboptimal parasternal window and Technically difficult study due to poor echo windows. IMPRESSIONS  1. Left ventricular ejection fraction, by estimation, is 50 to 55%. The left ventricle has low normal function. The left ventricle has no regional wall motion abnormalities. Left ventricular diastolic parameters are consistent with Grade I diastolic dysfunction (impaired relaxation).  2. Right ventricular systolic function is mildly reduced. The right ventricular size is normal.  3. Left atrial size was mildly dilated.  4. The mitral valve is degenerative. Mild mitral valve regurgitation. Moderate mitral annular calcification.  5. The aortic valve is tricuspid. There is moderate calcification of the aortic valve. There is mild thickening of the aortic valve. Aortic valve regurgitation is mild. Mild aortic valve stenosis.  6. The inferior vena cava is normal in size with greater than 50% respiratory variability, suggesting right atrial pressure of 3 mmHg. FINDINGS  Left Ventricle: Left ventricular ejection fraction, by estimation, is 50 to 55%. The left ventricle has low normal function. The left ventricle has no regional wall motion abnormalities. The left ventricular internal cavity size was normal in size. There is borderline concentric left ventricular hypertrophy. Left ventricular diastolic parameters are consistent with Grade I diastolic dysfunction (impaired relaxation). Right Ventricle: The right ventricular size is normal. No increase in right ventricular wall thickness. Right ventricular  systolic function is mildly reduced. Left Atrium: Left atrial size was mildly dilated. Right Atrium: Right atrial size was normal in size. Pericardium: There is no evidence of pericardial effusion. Mitral Valve: The mitral valve is degenerative in appearance. There is mild thickening of the mitral valve leaflet(s). Moderate mitral annular calcification. Mild mitral valve regurgitation. Tricuspid Valve: The tricuspid valve is normal in structure. Tricuspid valve regurgitation is trivial. Aortic Valve: The aortic valve is tricuspid. There is moderate calcification of the aortic valve. There is mild thickening of the aortic valve. There is mild to moderate aortic valve annular calcification. Aortic valve regurgitation is mild. Mild aortic stenosis is present. Aortic valve mean gradient measures 5.5 mmHg. Aortic valve peak gradient measures 9.6 mmHg. Aortic valve area, by VTI measures 1.56 cm. Pulmonic Valve: The pulmonic valve was normal in structure. Pulmonic valve regurgitation is not visualized. Aorta: The aortic root is normal in size and structure. Venous: The inferior vena cava is normal in size with greater than 50% respiratory variability, suggesting right atrial pressure of 3 mmHg. IAS/Shunts: The atrial septum is grossly normal.  LEFT VENTRICLE PLAX 2D LVIDd:         3.70 cm   Diastology LVIDs:         2.70 cm   LV e' medial:    5.75 cm/s LV PW:         1.00 cm   LV E/e' medial:  9.6 LV IVS:        1.00 cm   LV e' lateral:   5.44 cm/s LVOT diam:     2.00 cm   LV E/e' lateral: 10.1 LV SV:         45 LV SV Index:   24 LVOT Area:     3.14 cm  RIGHT VENTRICLE RV S prime:     11.20 cm/s LEFT ATRIUM             Index        RIGHT ATRIUM           Index LA Vol (A2C):  41.6 ml 22.57 ml/m  RA Area:     10.30 cm LA Vol (A4C):   29.7 ml 16.12 ml/m  RA Volume:   19.60 ml  10.64 ml/m LA Biplane Vol: 35.0 ml 18.99 ml/m  AORTIC VALVE AV Area (Vmax):    1.52 cm AV Area (Vmean):   1.39 cm AV Area (VTI):     1.56 cm  AV Vmax:           155.00 cm/s AV Vmean:          108.000 cm/s AV VTI:            0.288 m AV Peak Grad:      9.6 mmHg AV Mean Grad:      5.5 mmHg LVOT Vmax:         75.00 cm/s LVOT Vmean:        47.900 cm/s LVOT VTI:          0.143 m LVOT/AV VTI ratio: 0.50  AORTA Ao Root diam: 2.90 cm MITRAL VALVE MV Area (PHT): 3.27 cm    SHUNTS MV Decel Time: 232 msec    Systemic VTI:  0.14 m MV E velocity: 55.20 cm/s  Systemic Diam: 2.00 cm MV A velocity: 85.40 cm/s MV E/A ratio:  0.65 Orpah Cobb MD Electronically signed by Orpah Cobb MD Signature Date/Time: 11/09/2023/3:35:41 PM    Final    NM Pulmonary Perfusion Addendum Date: 11/09/2023 ADDENDUM REPORT: 11/09/2023 11:32 ADDENDUM: Critical Value/emergent results were called by telephone at the time of interpretation on 11/09/2023 at 11:32 am to provider Dr. Jerral Ralph, who verbally acknowledged these results. Electronically Signed   By: Kennith Center M.D.   On: 11/09/2023 11:32   Result Date: 11/09/2023 CLINICAL DATA:  Acute hypoxic respiratory failure. Elevated D-dimer. EXAM: NUCLEAR MEDICINE PERFUSION LUNG SCAN TECHNIQUE: Perfusion images were obtained in multiple projections after intravenous injection of radiopharmaceutical. Ventilation scans intentionally deferred if perfusion scan and chest x-ray adequate for interpretation during COVID 19 epidemic. RADIOPHARMACEUTICALS:  4.3 mCi Tc-57m MAA IV COMPARISON:  Chest x-ray 11/08/2023. Previous nuclear medicine pulmonary perfusion study 09/12/2023. FINDINGS: Since the previous perfusion study of 2 months ago, the patient has developed bilateral segmental wedge-shaped peripheral perfusion defects in both lungs. Review of yesterday's chest x-ray reveals no pulmonary opacities in these regions. IMPRESSION: Imaging features consistent with pulmonary embolism present. Perfusion findings are new since prior study of 09/12/2023. Electronically Signed: By: Kennith Center M.D. On: 11/09/2023 11:18   CT HEAD WO CONTRAST ( ) Result  Date: 11/08/2023 CLINICAL DATA:  Syncope EXAM: CT HEAD WITHOUT CONTRAST TECHNIQUE: Contiguous axial images were obtained from the base of the skull through the vertex without intravenous contrast. RADIATION DOSE REDUCTION: This exam was performed according to the departmental dose-optimization program which includes automated exposure control, adjustment of the mA and/or kV according to patient size and/or use of iterative reconstruction technique. COMPARISON:  10/01/2023 FINDINGS: Brain: Normal anatomic configuration. Parenchymal volume loss is commensurate with the patient's age. Stable moderate periventricular white matter changes are present likely reflecting the sequela of small vessel ischemia. No abnormal intra or extra-axial mass lesion or fluid collection. No abnormal mass effect or midline shift. No evidence of acute intracranial hemorrhage or infarct. Ventricular size is normal. Cerebellum unremarkable. Vascular: No asymmetric hyperdense vasculature at the skull base. Skull: Intact Sinuses/Orbits: Paranasal sinuses are clear. Orbits are unremarkable. Other: Mastoid air cells and middle ear cavities are clear. IMPRESSION: 1. No acute intracranial hemorrhage or infarct. 2. Stable senescent change. Electronically  Signed   By: Helyn Numbers M.D.   On: 11/08/2023 23:19   DG Chest Port 1 View Result Date: 11/08/2023 CLINICAL DATA:  Sepsis.  Low O2 sat EXAM: PORTABLE CHEST 1 VIEW COMPARISON:  X-ray 10/01/2023 and older FINDINGS: Hyperinflation. Chronic lung changes. There is some bandlike changes at the left lung base. Increased from previous. Favor atelectasis. Recommend follow-up. No pneumothorax, effusion or edema. Normal cardiopericardial silhouette calcified aorta. Calcified tracheobronchial tree. Fixation hardware at the edge of the imaging field along the lower cervical spine. IMPRESSION: Hyperinflation with chronic changes. Increasing bandlike opacity left lung base such as atelectasis. Electronically  Signed   By: Karen Kays M.D.   On: 11/08/2023 18:51   MR Brain W Wo Contrast Result Date: 11/03/2023 CLINICAL DATA:  Brain/CNS neoplasm, monitor. History of pituitary adenoma status post surgery and radiation. EXAM: MRI HEAD WITHOUT AND WITH CONTRAST TECHNIQUE: Multiplanar, multiecho pulse sequences of the brain and surrounding structures were obtained without and with intravenous contrast. CONTRAST:  7mL GADAVIST GADOBUTROL 1 MMOL/ML IV SOLN COMPARISON:  Head CT 10/01/2023. Noncontrast head MRI 06/21/2022. Contrasted head MRI 03/25/2022. FINDINGS: Brain: There is no evidence of an acute infarct, midline shift, or extra-axial fluid collection. Patchy to confluent T2 hyperintensities in the cerebral white matter are similar to the prior MRI and are nonspecific but compatible with moderate chronic small vessel ischemic disease. There is moderate cerebral atrophy. Chronic lacunar infarcts are noted in the left basal ganglia. There are also small chronic bilateral cerebellar infarcts. Dedicated imaging was performed through the sella turcica. Residual heterogeneously enhancing soft tissue in the sella has mildly decreased in volume from 03/25/2022, most conspicuously on coronal imaging where this tissue has decreased in height. Masslike tissue eccentrically in the left lateral aspect of the sella measures 13 x 7 mm on sagittal images (series 13, image 9, previously 14 x 8 mm). This tissue is again noted to bulge into the left cavernous sinus. Rightward deviation of the pituitary stalk is unchanged. There is no suprasellar extension. The optic chiasm is unremarkable. Vascular: Major intracranial arterial flow voids are preserved. Chronically absent normal flow voids in the left transverse and sigmoid sinuses presumably reflecting slow flow given enhancement. Skull and upper cervical spine: Unremarkable bone marrow signal. Partially visualized anterior cervical spine fusion. Sinuses/Orbits: Unremarkable orbits.  Minimal mucosal thickening in the paranasal sinuses. Clear mastoid air cells. Other: None. IMPRESSION: 1. Slightly decreased volume of residual pituitary adenoma since 03/25/2022. 2. Moderate chronic small vessel ischemic disease. Electronically Signed   By: Sebastian Ache M.D.   On: 11/03/2023 14:49     TODAY-DAY OF DISCHARGE:  Subjective:   Adriana Jordan today has no headache,no chest abdominal pain,no new weakness tingling or numbness, feels much better wants to go home today.   Objective:   Blood pressure 117/61, pulse 78, temperature (!) 97.5 F (36.4 C), temperature source Oral, resp. rate 16, height 5\' 6"  (1.676 m), weight 70.3 kg, SpO2 93%.  Intake/Output Summary (Last 24 hours) at 11/11/2023 1018 Last data filed at 11/11/2023 0739 Gross per 24 hour  Intake 360 ml  Output 700 ml  Net -340 ml   Filed Weights   11/08/23 1729 11/10/23 0500  Weight: 74.8 kg 70.3 kg    Exam: Awake Alert, Oriented *3, No new F.N deficits, Normal affect [place].AT,PERRAL Supple Neck,No JVD, No cervical lymphadenopathy appriciated.  Symmetrical Chest wall movement, Good air movement bilaterally, CTAB RRR,No Gallops,Rubs or new Murmurs, No Parasternal Heave +ve B.Sounds, Abd Soft, Non tender,  No organomegaly appriciated, No rebound -guarding or rigidity. No Cyanosis, Clubbing or edema, No new Rash or bruise   PERTINENT RADIOLOGIC STUDIES: VAS [place] LOWER EXTREMITY VENOUS (DVT) Result Date: 11/09/2023  Lower Venous DVT Study Patient Name:  Adriana Jordan  Date of Exam:   11/09/2023 Medical Rec #: [id number]          Accession #:    [id number] Date of Birth: 1935-08-02          Patient Gender: F Patient Age:   13 years Exam Location:  [facility] Procedure:      VAS [place] LOWER EXTREMITY VENOUS (DVT) Referring Phys: Eulah Pont SUNDIL --------------------------------------------------------------------------------  Indications: Swelling, Edema, SOB, and stroke.  Anticoagulation: Plavix. Limitations:  Involuntary patient movement. Comparison Study: No prior exam. Performing Technologist: Fernande Bras  Examination Guidelines: A complete evaluation includes B-mode imaging, spectral Doppler, color Doppler, and power Doppler as needed of all accessible portions of each vessel. Bilateral testing is considered an integral part of a complete examination. Limited examinations for reoccurring indications may be performed as noted. The reflux portion of the exam is performed with the patient in reverse Trendelenburg.  +---------+---------------+---------+-----------+----------+--------------+ RIGHT    CompressibilityPhasicitySpontaneityPropertiesThrombus Aging +---------+---------------+---------+-----------+----------+--------------+ CFV      Partial        Yes      Yes                                 +---------+---------------+---------+-----------+----------+--------------+ SFJ      None           No       Yes                                 +---------+---------------+---------+-----------+----------+--------------+ FV Prox  None           No       No                                  +---------+---------------+---------+-----------+----------+--------------+ FV Mid   Partial        Yes      Yes                                 +---------+---------------+---------+-----------+----------+--------------+ FV DistalNone           No       No                                  +---------+---------------+---------+-----------+----------+--------------+ PFV      None           Yes      Yes                                 +---------+---------------+---------+-----------+----------+--------------+ POP      None           No       No                                  +---------+---------------+---------+-----------+----------+--------------+ PTV  Partial        No       No                                   +---------+---------------+---------+-----------+----------+--------------+ PERO     Partial        No       No                                  +---------+---------------+---------+-----------+----------+--------------+ EIV                     Yes      Yes                                 +---------+---------------+---------+-----------+----------+--------------+   +---------+---------------+---------+-----------+----------+--------------+ LEFT     CompressibilityPhasicitySpontaneityPropertiesThrombus Aging +---------+---------------+---------+-----------+----------+--------------+ CFV      Full           Yes      Yes                                 +---------+---------------+---------+-----------+----------+--------------+ SFJ      Full           Yes      Yes                                 +---------+---------------+---------+-----------+----------+--------------+ FV Prox  Full                                                        +---------+---------------+---------+-----------+----------+--------------+ FV Mid   Full                                                        +---------+---------------+---------+-----------+----------+--------------+ FV DistalFull                                                        +---------+---------------+---------+-----------+----------+--------------+ PFV      Full                                                        +---------+---------------+---------+-----------+----------+--------------+ POP      Full           Yes      Yes                                 +---------+---------------+---------+-----------+----------+--------------+ PTV      Full                                                        +---------+---------------+---------+-----------+----------+--------------+  PERO     None           No       No                                   +---------+---------------+---------+-----------+----------+--------------+     Summary: RIGHT: - Findings consistent with acute deep vein thrombosis involving the right common femoral vein, SF junction, right proximal profunda vein, right femoral vein, right popliteal vein, right posterior tibial veins, and right peroneal veins.  - No cystic structure found in the popliteal fossa.  LEFT: - Findings consistent with acute deep vein thrombosis involving the left peroneal veins.  - No cystic structure found in the popliteal fossa.  *See table(s) above for measurements and observations. Electronically signed by Coral Else MD on 11/09/2023 at 7:00:35 PM.    Final    ECHOCARDIOGRAM COMPLETE Result Date: 11/09/2023    ECHOCARDIOGRAM REPORT   Patient Name:   Adriana Jordan Date of Exam: 11/09/2023 Medical Rec #:  [id number]         Height:       66.0 in Accession #:    [id number]        Weight:       165.0 lb Date of Birth:  1934/10/27         BSA:          1.843 m Patient Age:    89 years          BP:           113/66 mmHg Patient Gender: F                 HR:           75 bpm. Exam Location:  Inpatient Procedure: 2D Echo, Cardiac Doppler and Color Doppler (Both Spectral and Color            Flow Doppler were utilized during procedure). Indications:     Elevated Troponin  History:         Patient has prior history of Echocardiogram examinations, most                  recent 07/31/2023. CAD; Risk Factors:Hypertension and                  Dyslipidemia.  Sonographer:     Geoffery Spruce, FE, PE Referring Phys:  [id number] Tereasa Coop Diagnosing Phys: Orpah Cobb MD  Sonographer Comments: Suboptimal parasternal window and Technically difficult study due to poor echo windows. IMPRESSIONS  1. Left ventricular ejection fraction, by estimation, is 50 to 55%. The left ventricle has low normal function. The left ventricle has no regional wall motion abnormalities. Left ventricular diastolic parameters are consistent with  Grade I diastolic dysfunction (impaired relaxation).  2. Right ventricular systolic function is mildly reduced. The right ventricular size is normal.  3. Left atrial size was mildly dilated.  4. The mitral valve is degenerative. Mild mitral valve regurgitation. Moderate mitral annular calcification.  5. The aortic valve is tricuspid. There is moderate calcification of the aortic valve. There is mild thickening of the aortic valve. Aortic valve regurgitation is mild. Mild aortic valve stenosis.  6. The inferior vena cava is normal in size with greater than 50% respiratory variability, suggesting right atrial pressure of 3 mmHg. FINDINGS  Left Ventricle: Left ventricular ejection fraction, by estimation, is 50 to 55%. The  left ventricle has low normal function. The left ventricle has no regional wall motion abnormalities. The left ventricular internal cavity size was normal in size. There is borderline concentric left ventricular hypertrophy. Left ventricular diastolic parameters are consistent with Grade I diastolic dysfunction (impaired relaxation). Right Ventricle: The right ventricular size is normal. No increase in right ventricular wall thickness. Right ventricular systolic function is mildly reduced. Left Atrium: Left atrial size was mildly dilated. Right Atrium: Right atrial size was normal in size. Pericardium: There is no evidence of pericardial effusion. Mitral Valve: The mitral valve is degenerative in appearance. There is mild thickening of the mitral valve leaflet(s). Moderate mitral annular calcification. Mild mitral valve regurgitation. Tricuspid Valve: The tricuspid valve is normal in structure. Tricuspid valve regurgitation is trivial. Aortic Valve: The aortic valve is tricuspid. There is moderate calcification of the aortic valve. There is mild thickening of the aortic valve. There is mild to moderate aortic valve annular calcification. Aortic valve regurgitation is mild. Mild aortic stenosis is  present. Aortic valve mean gradient measures 5.5 mmHg. Aortic valve peak gradient measures 9.6 mmHg. Aortic valve area, by VTI measures 1.56 cm. Pulmonic Valve: The pulmonic valve was normal in structure. Pulmonic valve regurgitation is not visualized. Aorta: The aortic root is normal in size and structure. Venous: The inferior vena cava is normal in size with greater than 50% respiratory variability, suggesting right atrial pressure of 3 mmHg. IAS/Shunts: The atrial septum is grossly normal.  LEFT VENTRICLE PLAX 2D LVIDd:         3.70 cm   Diastology LVIDs:         2.70 cm   LV e' medial:    5.75 cm/s LV PW:         1.00 cm   LV E/e' medial:  9.6 LV IVS:        1.00 cm   LV e' lateral:   5.44 cm/s LVOT diam:     2.00 cm   LV E/e' lateral: 10.1 LV SV:         45 LV SV Index:   24 LVOT Area:     3.14 cm  RIGHT VENTRICLE RV S prime:     11.20 cm/s LEFT ATRIUM             Index        RIGHT ATRIUM           Index LA Vol (A2C):   41.6 ml 22.57 ml/m  RA Area:     10.30 cm LA Vol (A4C):   29.7 ml 16.12 ml/m  RA Volume:   19.60 ml  10.64 ml/m LA Biplane Vol: 35.0 ml 18.99 ml/m  AORTIC VALVE AV Area (Vmax):    1.52 cm AV Area (Vmean):   1.39 cm AV Area (VTI):     1.56 cm AV Vmax:           155.00 cm/s AV Vmean:          108.000 cm/s AV VTI:            0.288 m AV Peak Grad:      9.6 mmHg AV Mean Grad:      5.5 mmHg LVOT Vmax:         75.00 cm/s LVOT Vmean:        47.900 cm/s LVOT VTI:          0.143 m LVOT/AV VTI ratio: 0.50  AORTA Ao Root diam: 2.90 cm MITRAL VALVE MV Area (  PHT): 3.27 cm    SHUNTS MV Decel Time: 232 msec    Systemic VTI:  0.14 m MV E velocity: 55.20 cm/s  Systemic Diam: 2.00 cm MV A velocity: 85.40 cm/s MV E/A ratio:  0.65 Orpah Cobb MD Electronically signed by Orpah Cobb MD Signature Date/Time: 11/09/2023/3:35:41 PM    Final      PERTINENT LAB RESULTS: CBC: Recent Labs    11/10/23 0455 11/11/23 0519  WBC 5.1 4.1  HGB 10.8* 9.7*  HCT 32.6* 29.9*  PLT 250 255   CMET CMP      Component Value Date/Time   NA 138 11/10/2023 1124   K 4.2 11/10/2023 1124   CL 102 11/10/2023 1124   CO2 23 11/10/2023 1124   GLUCOSE 179 (H) 11/10/2023 1124   BUN 22 11/10/2023 1124   CREATININE 1.45 (H) 11/10/2023 1124   CALCIUM 10.0 11/10/2023 1124   PROT 6.6 11/09/2023 0245   ALBUMIN 3.9 11/09/2023 0245   AST 32 11/09/2023 0245   ALT 19 11/09/2023 0245   ALKPHOS 55 11/09/2023 0245   BILITOT 1.0 11/09/2023 0245   GFRNONAA 35 (L) 11/10/2023 1124    GFR Estimated Creatinine Clearance: 25.6 mL/min (A) (by C-G formula based on SCr of 1.45 mg/dL (H)). No results for input(s): "LIPASE", "AMYLASE" in the last 72 hours. No results for input(s): "CKTOTAL", "CKMB", "CKMBINDEX", "TROPONINI" in the last 72 hours. Invalid input(s): "POCBNP" Recent Labs    11/08/23 1812  DDIMER 9.95*   No results for input(s): "HGBA1C" in the last 72 hours. No results for input(s): "CHOL", "HDL", "LDLCALC", "TRIG", "CHOLHDL", "LDLDIRECT" in the last 72 hours. No results for input(s): "TSH", "T4TOTAL", "T3FREE", "THYROIDAB" in the last 72 hours.  Invalid input(s): "FREET3" No results for input(s): "VITAMINB12", "FOLATE", "FERRITIN", "TIBC", "IRON", "RETICCTPCT" in the last 72 hours. Coags: Recent Labs    11/08/23 1812  INR 0.9   Microbiology: Recent Results (from the past 240 hours)  Resp panel by RT-PCR (RSV, Flu A&B, Covid) Anterior Nasal Swab     Status: None   Collection Time: 11/08/23  5:51 PM   Specimen: Anterior Nasal Swab  Result Value Ref Range Status   SARS Coronavirus 2 by RT PCR NEGATIVE NEGATIVE Final   Influenza A by PCR NEGATIVE NEGATIVE Final   Influenza B by PCR NEGATIVE NEGATIVE Final    Comment: (NOTE) The Xpert Xpress SARS-CoV-2/FLU/RSV plus assay is intended as an aid in the diagnosis of influenza from Nasopharyngeal swab specimens and should not be used as a sole basis for treatment. Nasal washings and aspirates are unacceptable for Xpert Xpress  SARS-CoV-2/FLU/RSV testing.  Fact Sheet for Patients: BloggerCourse.com  Fact Sheet for Healthcare Providers: SeriousBroker.it  This test is not yet approved or cleared by the [place] FDA and has been authorized for detection and/or diagnosis of SARS-CoV-2 by FDA under an Emergency Use Authorization (EUA). This EUA will remain in effect (meaning this test can be used) for the duration of the COVID-19 declaration under Section 564(b)(1) of the Act, 21 U.S.C. section 360bbb-3(b)(1), unless the authorization is terminated or revoked.     Resp Syncytial Virus by PCR NEGATIVE NEGATIVE Final    Comment: (NOTE) Fact Sheet for Patients: BloggerCourse.com  Fact Sheet for Healthcare Providers: SeriousBroker.it  This test is not yet approved or cleared by the [place] FDA and has been authorized for detection and/or diagnosis of SARS-CoV-2 by FDA under an Emergency Use Authorization (EUA). This EUA will remain in effect (meaning this test can  be used) for the duration of the COVID-19 declaration under Section 564(b)(1) of the Act, 21 U.S.C. section 360bbb-3(b)(1), unless the authorization is terminated or revoked.  Performed at [facility], [address], [place], [place] [address]   Blood Culture (routine x 2)     Status: None (Preliminary result)   Collection Time: 11/08/23  6:10 PM   Specimen: BLOOD  Result Value Ref Range Status   Specimen Description BLOOD LEFT ANTECUBITAL  Final   Special Requests   Final    BOTTLES DRAWN AEROBIC AND ANAEROBIC Blood Culture adequate volume   Culture   Final    NO GROWTH 3 DAYS Performed at [facility], [address] [place], [place] [address]    Report Status PENDING  Incomplete  Blood Culture (routine x 2)     Status: None (Preliminary result)   Collection Time: 11/08/23 10:11 PM   Specimen: BLOOD  Result Value  Ref Range Status   Specimen Description BLOOD BLOOD LEFT ARM  Final   Special Requests   Final    BOTTLES DRAWN AEROBIC AND ANAEROBIC Blood Culture results may not be optimal due to an inadequate volume of blood received in culture bottles   Culture   Final    NO GROWTH 3 DAYS Performed at [facility], [address] [place], [place] [address]    Report Status PENDING  Incomplete    FURTHER DISCHARGE INSTRUCTIONS:  Get Medicines reviewed and adjusted: Please take all your medications with you for your next visit with your Primary MD  Laboratory/radiological data: Please request your Primary MD to go over all hospital tests and procedure/radiological results at the follow up, please ask your Primary MD to get all Hospital records sent to his/her office.  In some cases, they will be blood work, cultures and biopsy results pending at the time of your discharge. Please request that your primary care M.D. goes through all the records of your hospital data and follows up on these results.  Also Note the following: If you experience worsening of your admission symptoms, develop shortness of breath, life threatening emergency, suicidal or homicidal thoughts you must seek medical attention immediately by calling 911 or calling your MD immediately  if symptoms less severe.  You must read complete instructions/literature along with all the possible adverse reactions/side effects for all the Medicines you take and that have been prescribed to you. Take any new Medicines after you have completely understood and accpet all the possible adverse reactions/side effects.   Do not drive when taking Pain medications or sleeping medications (Benzodaizepines)  Do not take more than prescribed Pain, Sleep and Anxiety Medications. It is not advisable to combine anxiety,sleep and pain medications without talking with your primary care practitioner  Special Instructions: If you have smoked or chewed  Tobacco  in the last 2 yrs please stop smoking, stop any regular Alcohol  and or any Recreational drug use.  Wear Seat belts while driving.  Please note: You were cared for by a hospitalist during your hospital stay. Once you are discharged, your primary care physician will handle any further medical issues. Please note that NO REFILLS for any discharge medications will be authorized once you are discharged, as it is imperative that you return to your primary care physician (or establish a relationship with a primary care physician if you do not have one) for your post hospital discharge needs so that they can reassess your need for medications  and monitor your lab values.  Total Time spent coordinating discharge including counseling, education and face to face time equals greater than 30 minutes.  SignedJeoffrey Massed 11/11/2023 10:18 AM

## 2023-11-11 NOTE — Progress Notes (Signed)
 Physical Therapy Treatment Patient Details Name: Adriana Jordan MRN: [id number] DOB: [birth year] Today's Date: 11/11/2023   History of Present Illness Patient is a 89 year old female admitted with hypotension and generalized weakness on 10/01/23 after recent discharge from hospital with respiratory failure and pneumonia. History of HTN, HLD, CAD, CVA, syncope, GERD, pituitary adenoma, adrenal insufficiency    PT Comments  Progressing well towards acute PT goals. Agreeable to use RW at all times following d/c, and eager to work with OPPT - ideally close to her home where she has had a stint of rehab in the past and loved it. SpO2 at rest 90%, up to 96% while ambulating. Supervision for safety with transfers and gait. No overt LOB but does need RW to stabilize herself adequately. Declines stair training - I feel with son's assist and a rail she will have adequate strength and control to navigate steps at home. Patient will continue to benefit from skilled physical therapy services to further improve independence with functional mobility.    If plan is discharge home, recommend the following: A little help with bathing/dressing/bathroom;Assistance with cooking/housework;Assist for transportation;Help with stairs or ramp for entrance;A little help with walking and/or transfers   Can travel by private vehicle        Equipment Recommendations  None recommended by PT    Recommendations for Other Services       Precautions / Restrictions Precautions Precautions: Fall Restrictions Weight Bearing Restrictions Per Provider Order: No     Mobility  Bed Mobility Overal bed mobility: Modified Independent             General bed mobility comments: increased time, no physical assistance provided    Transfers Overall transfer level: Needs assistance Equipment used: Rolling walker (2 wheels), None Transfers: Sit to/from Stand, Bed to chair/wheelchair/BSC Sit to Stand: Supervision   Step  pivot transfers: Supervision       General transfer comment: Supervision for safety with and without RW, reaches for [facility] rail without device. Cues for technique. More stable with RW and agrees to use at home.    Ambulation/Gait Ambulation/Gait assistance: Supervision Gait Distance (Feet): 150 Feet Assistive device: Rolling walker (2 wheels) Gait Pattern/deviations: Step-through pattern, Decreased stride length, Antalgic, Trendelenburg Gait velocity: Dec Gait velocity interpretation: <1.8 ft/sec, indicate of risk for recurrent falls   General Gait Details: No overt LOB noted with RW for support. Educated on importance of use at this time due to weakness. Trendelenburg on Rt. Good proximity to RW for adequate support. SpO2 94% and greater while ambulating when pleth readable.   Stairs Stairs:  (Declines to practice, states she feels confident with rail and son assist at home. Gross strength and control with UE support appear adequate to navigate her 3 steps at home.)           Wheelchair Mobility     Tilt Bed    Modified Rankin (Stroke Patients Only)       Balance Overall balance assessment: Mild deficits observed, not formally tested                                          Communication Communication Communication: No apparent difficulties Factors Affecting Communication: Hearing impaired  Cognition Arousal: Alert Behavior During Therapy: WFL for tasks assessed/performed   PT - Cognitive impairments: No apparent impairments  Following commands: Intact      Cueing Cueing Techniques: Verbal cues  Exercises General Exercises - Lower Extremity Ankle Circles/Pumps: AROM, Both, 10 reps, Seated Quad Sets: Strengthening, Both, 10 reps, Seated Gluteal Sets: Strengthening, Both, 10 reps, Seated    General Comments General comments (skin integrity, edema, etc.): SpO2 at rest 90% on RA. Ambulates increased to 96%.       Pertinent Vitals/Pain Pain Assessment Pain Assessment: No/denies pain    Home Living                          Prior Function            PT Goals (current goals can now be found in the care plan section) Acute Rehab PT Goals Patient Stated Goal: Get well go home PT Goal Formulation: With patient Time For Goal Achievement: 11/24/23 Potential to Achieve Goals: Good Progress towards PT goals: Progressing toward goals    Frequency    Min 2X/week      PT Plan      Co-evaluation              AM-PAC PT "6 Clicks" Mobility   Outcome Measure  Help needed turning from your back to your side while in a flat bed without using bedrails?: None Help needed moving from lying on your back to sitting on the side of a flat bed without using bedrails?: None Help needed moving to and from a bed to a chair (including a wheelchair)?: A Little Help needed standing up from a chair using your arms (e.g., wheelchair or bedside chair)?: A Little Help needed to walk in hospital room?: A Little Help needed climbing 3-5 steps with a railing? : A Little 6 Click Score: 20    End of Session Equipment Utilized During Treatment: Gait belt Activity Tolerance: Patient tolerated treatment well Patient left: in chair;with call bell/phone within reach;with chair alarm set Nurse Communication: Mobility status PT Visit Diagnosis: Other abnormalities of gait and mobility (R26.89);Muscle weakness (generalized) (M62.81)     Time: 1610-9604 PT Time Calculation (min) (ACUTE ONLY): 15 min  Charges:    $Gait Training: 8-22 mins PT General Charges $$ ACUTE PT VISIT: 1 Visit                     Kathlyn [place], PT, DPT [facility]  Rehabilitation Services Physical Therapist Office: [phone] Website: [email or web address]    Berton Mount 11/11/2023, 10:47 AM

## 2023-11-11 NOTE — Care Management Important Message (Signed)
 Important Message  Patient Details  Name: Adriana Jordan MRN: [id number] Date of Birth: 1935-07-24   Important Message Given:  Yes - Medicare IM  Patient let prior to IM delivery will mail a copy to the patient home address   Dorena Bodo 11/11/2023, 4:38 PM

## 2023-11-13 LAB — CULTURE, BLOOD (ROUTINE X 2)
Culture: NO GROWTH
Culture: NO GROWTH
Special Requests: ADEQUATE

## 2023-11-13 IMAGING — CT CT ABD-PELV W/ CM
2 of 5 series · 16 of 46 positions shown, 18 images · IV contrast (Omni 300)
Comparison: 09/26/2021

CLINICAL DATA: Left lower quadrant abdominal pain

EXAM:
CT ABDOMEN AND PELVIS WITH CONTRAST
TECHNIQUE: Multidetector CT imaging of the abdomen and pelvis was performed
using the standard protocol following bolus administration of
intravenous contrast.

[Series 3: a/p w/ 5mm · axial · 0.83mm/px · z∈[+720,+1070]mm · 13 of 80 slices shown, 15 images]
[im 5/80  soft-tissue]
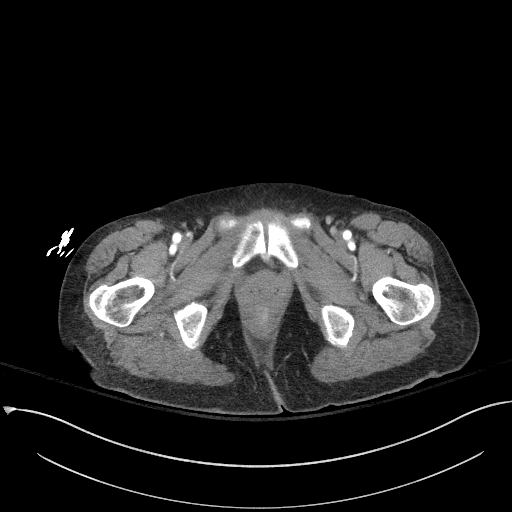
[im 5/80  bone]
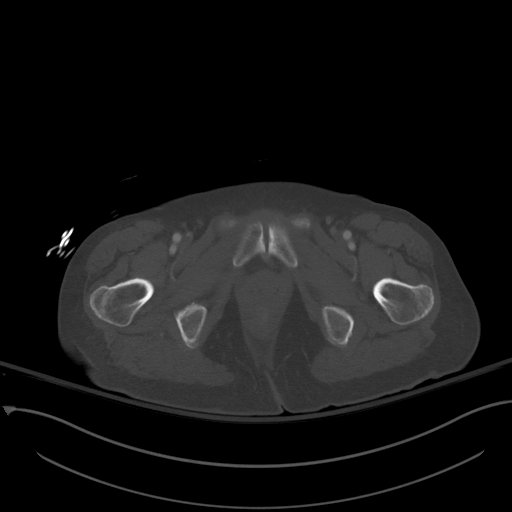
[im 13/80  soft-tissue]
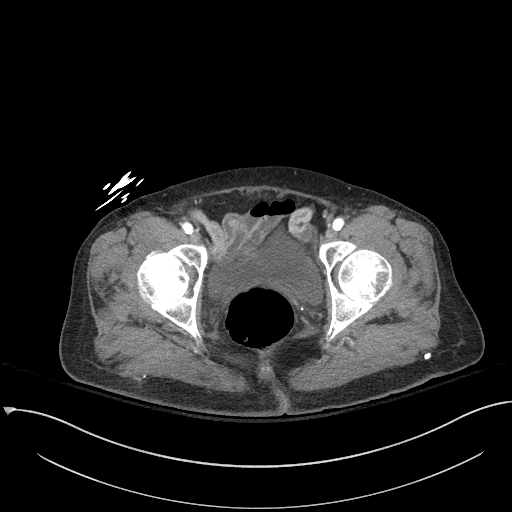
[im 17/80  soft-tissue]
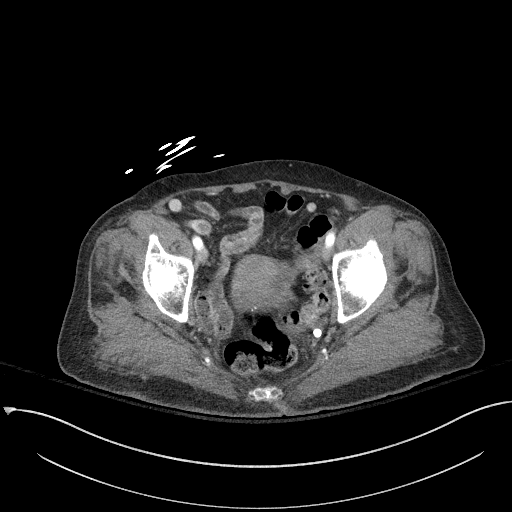
[im 21/80  soft-tissue]
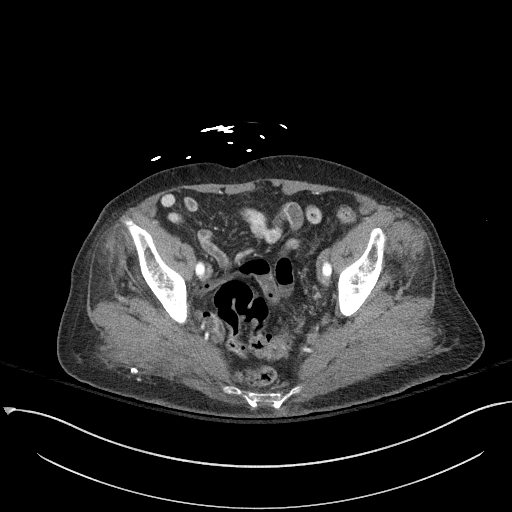
[im 30/80  soft-tissue]
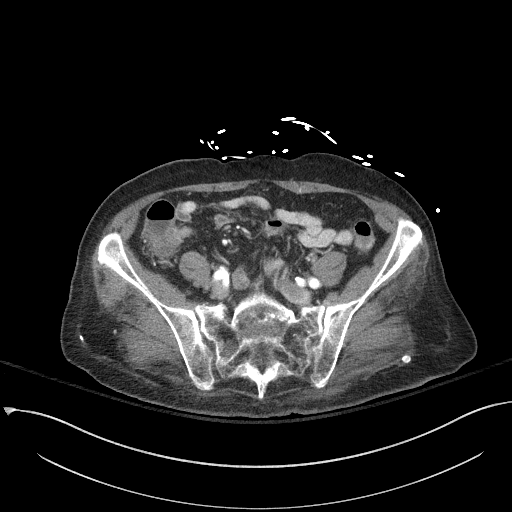
[im 34/80  soft-tissue]
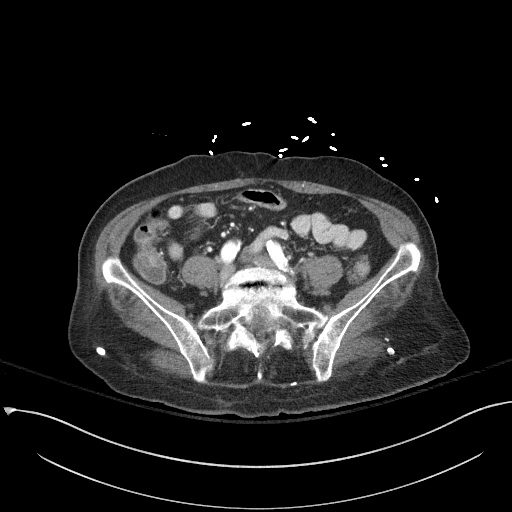
[im 42/80  soft-tissue]
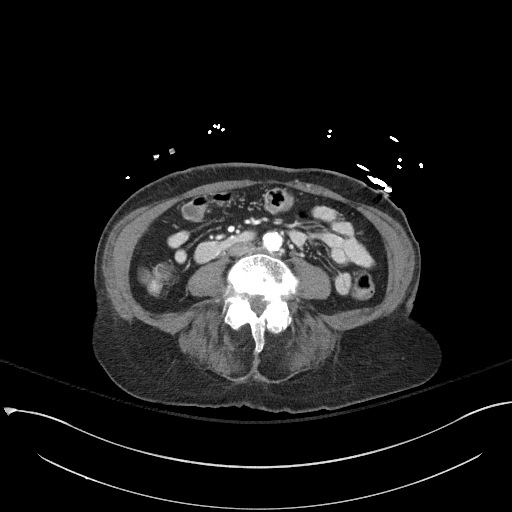
[im 46/80  soft-tissue]
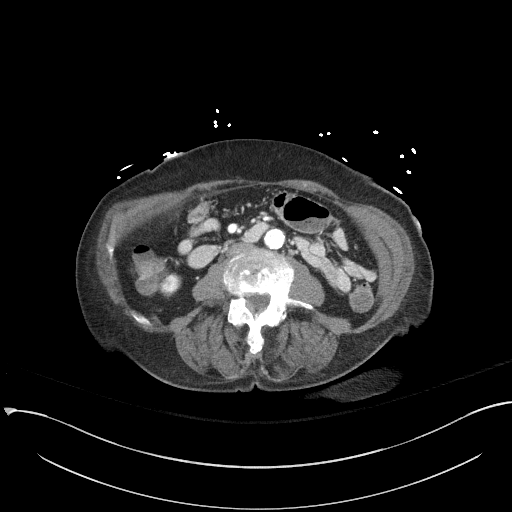
[im 50/80  soft-tissue]
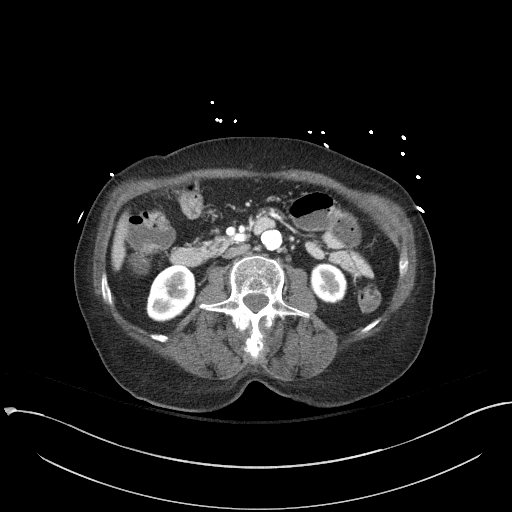
[im 50/80  bone]
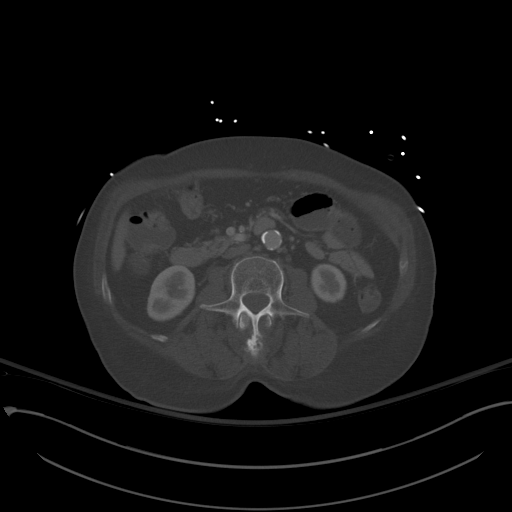
[im 59/80  soft-tissue]
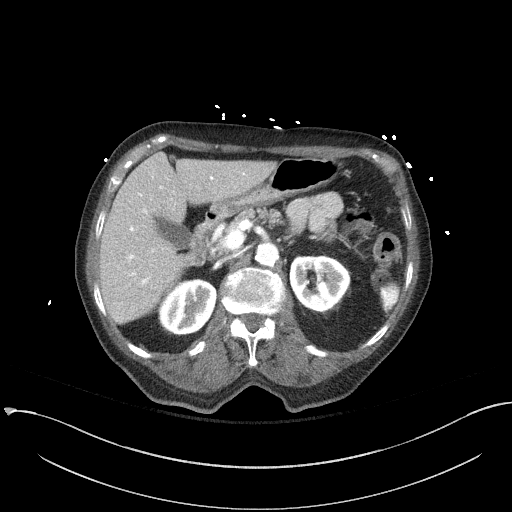
[im 63/80  soft-tissue]
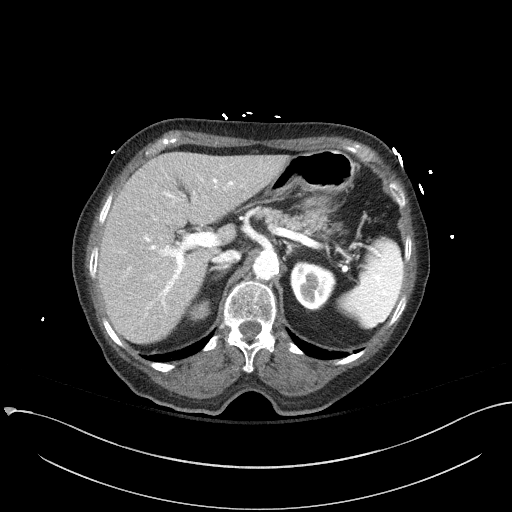
[im 67/80  soft-tissue]
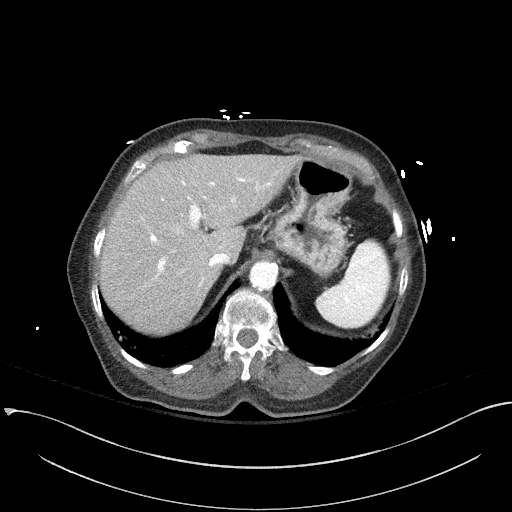
[im 75/80  soft-tissue]
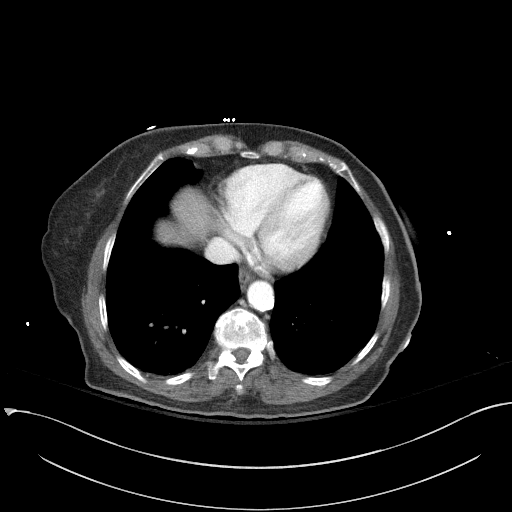

[Series 6: a/p w/ cor · coronal · 0.77mm/px · 3 of 129 slices shown]
[im 43/129  soft-tissue]
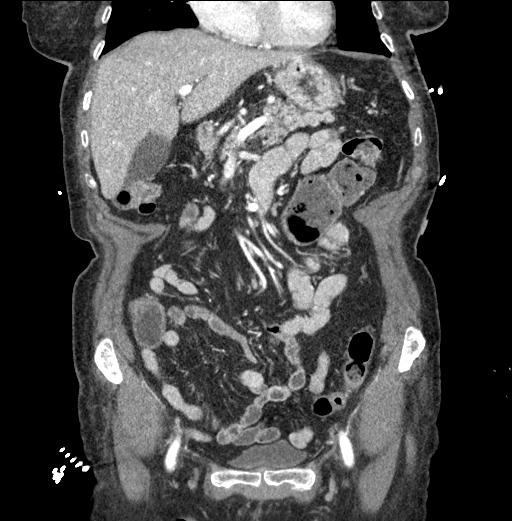
[im 57/129  soft-tissue]
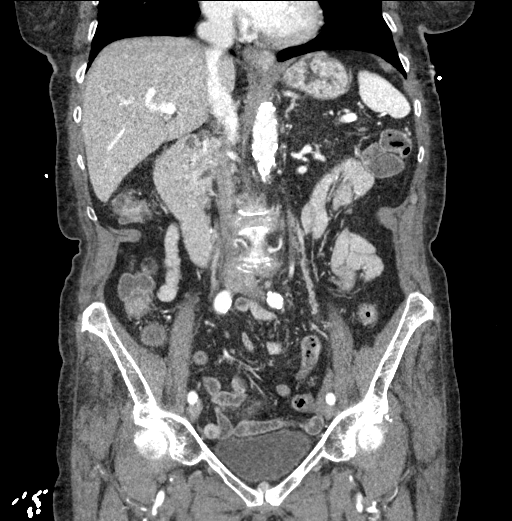
[im 72/129  soft-tissue]
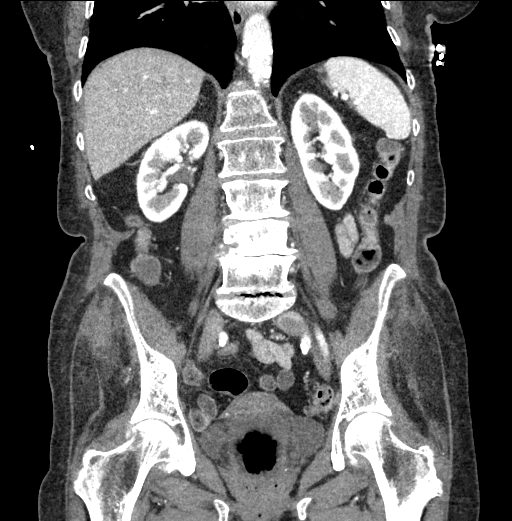

[16 of 46 positions shown; findings below may reference images not displayed]

RADIATION DOSE REDUCTION: This exam was performed according to the
departmental dose-optimization program which includes automated
exposure control, adjustment of the mA and/or kV according to
patient size and/or use of iterative reconstruction technique.

CONTRAST:  80mL OMNIPAQUE IOHEXOL 350 MG/ML SOLN
FINDINGS: Lower chest: Incidental note made of right lung base arteriovenous
malformation, image [DATE]. No acute abnormality identified.

Hepatobiliary: Mild diffuse low attenuation within the liver suggest
hepatic steatosis. No suspicious liver lesion identified.
Gallbladder unremarkable. No bile duct dilatation.

Pancreas: Unremarkable. No pancreatic ductal dilatation or
surrounding inflammatory changes.

Spleen: Normal in size without focal abnormality.

Adrenals/Urinary Tract: Adrenal glands are unremarkable. Kidneys are
normal, without renal calculi, focal lesion, or hydronephrosis.
Bladder is unremarkable.

Stomach/Bowel: Small hiatal hernia. The appendix is not confidently
identified. No pericecal inflammation identified. No small bowel
wall thickening, inflammation or distension. Sigmoid diverticulosis
identified. No significant wall thickening, inflammation or
distension to suggest acute diverticulitis.

Vascular/Lymphatic: Aortic atherosclerosis. No aneurysm. No signs of
abdominopelvic adenopathy.

Reproductive: Uterus and adnexal structures are unremarkable.

Other: No free fluid or fluid collections identified. No abdominal
wall hernia identified.

Musculoskeletal: Diffuse osteopenia. Marked degenerative disc
disease identified at L4-5 and L5-S1. Mild degenerative disc disease
identified L3-4. Bilateral lower lumbar spine facet arthropathy. No
acute osseous findings.
IMPRESSION: 1. No acute findings within the abdomen or pelvis.
2. Sigmoid diverticulosis without evidence for acute diverticulitis.
3. Hepatic steatosis.
4. Aortic Atherosclerosis ([id number]).

## 2023-11-22 NOTE — Therapy (Deleted)
 OUTPATIENT PHYSICAL THERAPY LOWER EXTREMITY EVALUATION   Patient Name: JACKELYN ILLINGWORTH MRN: [id number] DOB:03-28-1935, 89 y.o., female Today's Date: 11/22/2023  END OF SESSION:   Past Medical History:  Diagnosis Date   Acute respiratory failure with hypoxia (HCC) 11/15/2017   AKI (acute kidney injury) (HCC) 11/15/2017   Arthritis    "all over" (11/14/2017)   Brain tumor (HCC)    "I still have it"; not cancer (11/14/2017)   Chest pain, atypical 09/11/2021   Chronic lower back pain    Community acquired pneumonia 02/14/2023   Coronary artery disease    Dr. Sharyn Deforest   Elevated CK 11/15/2017   GERD (gastroesophageal reflux disease)    History of blood transfusion    "don't remember why" (11/14/2017)   History of radiation therapy 07/03/2018   brain, pituitary/ 12.5 Gy in 1 fraction   Hypercholesteremia    Hypertension    Influenza A 11/14/2017   MI (myocardial infarction) (HCC) 2006   PONV (postoperative nausea and vomiting)    Skin cancer    "right forearm"   Past Surgical History:  Procedure Laterality Date   ANTERIOR CERVICAL DECOMP/DISCECTOMY FUSION     BACK SURGERY     CARPAL TUNNEL RELEASE Bilateral    CORONARY ANGIOPLASTY     DES mid LAD and mid RCA 09/21/04   CRANIOTOMY N/A 05/21/2015   Procedure: Transsphenoidal resection of pituitary tumor with Dr. Starling Eck for approach;  Surgeon: Claudetta Cuba, MD;  Location: [facility] NEURO ORS;  Service: Neurosurgery;  Laterality: N/A;  Transsphenoidal resection of pituitary tumor with Dr. Starling Eck for approach   SHOULDER OPEN ROTATOR CUFF REPAIR Bilateral    SKIN CANCER EXCISION Right    forearm   thumb surgery Right    placed srews to make straight   THYROID SURGERY     "goiter removed"   Patient Active Problem List   Diagnosis Date Noted   Hypothermia 11/09/2023   Acute hypoxemic respiratory failure (HCC) 11/08/2023   Pneumonia 09/27/2023   CAD S/P percutaneous coronary angioplasty 09/12/2023   Hypotension  09/12/2023   Near syncope 09/12/2023   Syncope 07/30/2023   Headache 06/21/2022   Adrenal insufficiency (HCC) 03/05/2022   Protein-calorie malnutrition, severe 03/03/2022   Hypokalemia 03/01/2022   Generalized weakness 03/01/2022   Protein-calorie malnutrition, moderate (HCC) 03/01/2022   Multiple falls 03/01/2022   Tick bite 03/01/2022   Hyponatremia 02/21/2022   History of CVA (cerebrovascular accident) 10/11/2019   Leukopenia 11/15/2017   AKI (acute kidney injury) (HCC) 11/15/2017   Hypertension 11/14/2017   Hyperlipidemia 11/14/2017   GERD (gastroesophageal reflux disease) 11/14/2017   Pituitary adenoma (HCC) 04/07/2015    PCP: Pasqual Bone, MD   REFERRING PROVIDER: Kimberly Penna  REFERRING DIAG:  R53.81 (ICD-10-CM) - Debility  R53.81 (ICD-10-CM) - Physical deconditioning    THERAPY DIAG:  No diagnosis found.  Rationale for Evaluation and Treatment: Rehabilitation  ONSET DATE: 10/01/23  SUBJECTIVE:   SUBJECTIVE STATEMENT: ***  PERTINENT HISTORY: Patient is a 89 year old female admitted with hypotension and generalized weakness on 10/01/23 after recent discharge from hospital with respiratory failure and pneumonia. History of HTN, HLD, CAD, CVA, syncope, GERD, pituitary adenoma, adrenal insufficiency PAIN:  Are you having pain? {OPRCPAIN:27236}  PRECAUTIONS: Fall  RED FLAGS: None   WEIGHT BEARING RESTRICTIONS: No  FALLS:  Has patient fallen in last 6 months? {fallsyesno:27318}  LIVING ENVIRONMENT: Lives with: {OPRC lives with:25569::"lives with their family"} Lives in: {Lives in:25570} Stairs: {opstairs:27293} Has following equipment at home: {Assistive  devices:23999}  OCCUPATION: retired  PLOF: Independent  PATIENT GOALS: To get around better  NEXT MD VISIT: TBD  OBJECTIVE:  Note: Objective measures were completed at Evaluation unless otherwise noted.  DIAGNOSTIC FINDINGS: none  PATIENT SURVEYS:  Patient-specific activity scoring  scheme (Point to one number):  "0" represents "unable to perform." "10" represents "able to perform at prior level. 0 1 2 3 4 5 6 7 8 9  10 (Date and Score) Activity Initial  Activity Eval                      Additional Additional Total score = sum of the activity scores/number of activities Minimum detectable change (90%CI) for average score = 2 points Minimum detectable change (90%CI) for single activity score = 3 points PSFS developed by: Jake Seats., & Binkley, J. (1995). Assessing disability and change on individual  patients: a report of a patient specific measure. Physiotherapy [place], [phone], [phone]. Reproduced with the permission of the authors  Score:   COGNITION: Overall cognitive status: {cognition:24006}     MUSCLE LENGTH: Hamstrings: Right *** deg; Left *** deg Maisie Fus test: Right *** deg; Left *** deg  POSTURE: {posture:25561}  PALPATION: ***  LOWER EXTREMITY ROM:  {AROM/PROM:27142} ROM Right eval Left eval  Hip flexion    Hip extension    Hip abduction    Hip adduction    Hip internal rotation    Hip external rotation    Knee flexion    Knee extension    Ankle dorsiflexion    Ankle plantarflexion    Ankle inversion    Ankle eversion     (Blank rows = not tested)  LOWER EXTREMITY MMT:  MMT Right eval Left eval  Hip flexion    Hip extension    Hip abduction    Hip adduction    Hip internal rotation    Hip external rotation    Knee flexion    Knee extension    Ankle dorsiflexion    Ankle plantarflexion    Ankle inversion    Ankle eversion     (Blank rows = not tested)  FUNCTIONAL TESTS:  30 seconds chair stand test  GAIT: Distance walked: *** Assistive device utilized: {Assistive devices:23999} Level of assistance: {Levels of assistance:24026} Comments: ***                                                                                                                                TREATMENT DATE:  ***    PATIENT EDUCATION:  Education details: Discussed eval findings, rehab rationale and POC and patient is in agreement  Person educated: Patient Education method: Explanation Education comprehension: verbalized understanding and needs further education  HOME EXERCISE PROGRAM: ***  ASSESSMENT:  CLINICAL IMPRESSION: Patient is a *** y.o. *** who was seen today for physical therapy evaluation and treatment for ***.   OBJECTIVE IMPAIRMENTS: {opptimpairments:25111}.   ACTIVITY LIMITATIONS: {  activitylimitations:27494}  PARTICIPATION LIMITATIONS: {participationrestrictions:25113}  PERSONAL FACTORS: {Personal factors:25162} are also affecting patient's functional outcome.   REHAB POTENTIAL: Fair ***  CLINICAL DECISION MAKING: Evolving/moderate complexity  EVALUATION COMPLEXITY: Low   GOALS: Goals reviewed with patient? No  SHORT TERM GOALS: Target date: *** Patient to demonstrate independence in HEP  Baseline: Goal status: INITIAL  2.  Assess 2 MWT and determine baseline Baseline:  Goal status: INITIAL   LONG TERM GOALS: Target date: ***  Patient will increase 30s chair stand reps from *** to *** with/without arms to demonstrate and improved functional ability with less pain/difficulty as well as reduce fall risk.  Baseline:  Goal status: INITIAL  2.  Patient will score at least ***% on FOTO to signify clinically meaningful improvement in functional abilities.   Baseline:  Goal status: INITIAL  3.  Assess progress in 2 MWT  Baseline:  Goal status: INITIAL  4.  *** Baseline:  Goal status: INITIAL  5.  *** Baseline:  Goal status: INITIAL  6.  *** Baseline:  Goal status: INITIAL   PLAN:  PT FREQUENCY: {rehab frequency:25116}  PT DURATION: {rehab duration:25117}  PLANNED INTERVENTIONS: {rehab planned interventions:25118::"97110-Therapeutic exercises","97530- Therapeutic [phone]- Neuromuscular re-education","97535- Self AOZH","08657-  Manual therapy"}  PLAN FOR NEXT SESSION: ***   Eldon Greenland, PT 11/22/2023, 3:19 PM

## 2023-11-24 ENCOUNTER — Ambulatory Visit

## 2023-12-12 ENCOUNTER — Other Ambulatory Visit (HOSPITAL_COMMUNITY): Payer: Self-pay

## 2023-12-31 ENCOUNTER — Emergency Department (HOSPITAL_COMMUNITY)

## 2023-12-31 ENCOUNTER — Inpatient Hospital Stay (HOSPITAL_COMMUNITY)
Admission: EM | Admit: 2023-12-31 | Discharge: 2024-01-05 | DRG: 194 | Disposition: A | Discharge status: Home Health | Attending: Cardiovascular Disease | Admitting: Cardiovascular Disease

## 2023-12-31 DIAGNOSIS — E8809 Other disorders of plasma-protein metabolism, not elsewhere classified: Secondary | ICD-10-CM | POA: Diagnosis present

## 2023-12-31 DIAGNOSIS — E2740 Unspecified adrenocortical insufficiency: Secondary | ICD-10-CM | POA: Diagnosis present

## 2023-12-31 DIAGNOSIS — Z66 Do not resuscitate: Secondary | ICD-10-CM | POA: Diagnosis present

## 2023-12-31 DIAGNOSIS — D631 Anemia in chronic kidney disease: Secondary | ICD-10-CM | POA: Diagnosis present

## 2023-12-31 DIAGNOSIS — I252 Old myocardial infarction: Secondary | ICD-10-CM | POA: Diagnosis not present

## 2023-12-31 DIAGNOSIS — Z7901 Long term (current) use of anticoagulants: Secondary | ICD-10-CM

## 2023-12-31 DIAGNOSIS — Z923 Personal history of irradiation: Secondary | ICD-10-CM

## 2023-12-31 DIAGNOSIS — I82433 Acute embolism and thrombosis of popliteal vein, bilateral: Secondary | ICD-10-CM | POA: Diagnosis present

## 2023-12-31 DIAGNOSIS — N179 Acute kidney failure, unspecified: Secondary | ICD-10-CM | POA: Diagnosis present

## 2023-12-31 DIAGNOSIS — E860 Dehydration: Secondary | ICD-10-CM | POA: Diagnosis present

## 2023-12-31 DIAGNOSIS — Z803 Family history of malignant neoplasm of breast: Secondary | ICD-10-CM

## 2023-12-31 DIAGNOSIS — I82452 Acute embolism and thrombosis of left peroneal vein: Secondary | ICD-10-CM | POA: Diagnosis present

## 2023-12-31 DIAGNOSIS — I2510 Atherosclerotic heart disease of native coronary artery without angina pectoris: Secondary | ICD-10-CM | POA: Diagnosis present

## 2023-12-31 DIAGNOSIS — I82413 Acute embolism and thrombosis of femoral vein, bilateral: Secondary | ICD-10-CM | POA: Diagnosis present

## 2023-12-31 DIAGNOSIS — Z79899 Other long term (current) drug therapy: Secondary | ICD-10-CM | POA: Diagnosis not present

## 2023-12-31 DIAGNOSIS — Z801 Family history of malignant neoplasm of trachea, bronchus and lung: Secondary | ICD-10-CM

## 2023-12-31 DIAGNOSIS — I129 Hypertensive chronic kidney disease with stage 1 through stage 4 chronic kidney disease, or unspecified chronic kidney disease: Secondary | ICD-10-CM | POA: Diagnosis present

## 2023-12-31 DIAGNOSIS — Z85828 Personal history of other malignant neoplasm of skin: Secondary | ICD-10-CM

## 2023-12-31 DIAGNOSIS — Z87891 Personal history of nicotine dependence: Secondary | ICD-10-CM

## 2023-12-31 DIAGNOSIS — K219 Gastro-esophageal reflux disease without esophagitis: Secondary | ICD-10-CM | POA: Diagnosis present

## 2023-12-31 DIAGNOSIS — N184 Chronic kidney disease, stage 4 (severe): Secondary | ICD-10-CM | POA: Diagnosis present

## 2023-12-31 DIAGNOSIS — E7800 Pure hypercholesterolemia, unspecified: Secondary | ICD-10-CM | POA: Diagnosis present

## 2023-12-31 DIAGNOSIS — Z955 Presence of coronary angioplasty implant and graft: Secondary | ICD-10-CM | POA: Diagnosis not present

## 2023-12-31 DIAGNOSIS — Z885 Allergy status to narcotic agent status: Secondary | ICD-10-CM

## 2023-12-31 DIAGNOSIS — M7989 Other specified soft tissue disorders: Secondary | ICD-10-CM | POA: Diagnosis not present

## 2023-12-31 DIAGNOSIS — J189 Pneumonia, unspecified organism: Secondary | ICD-10-CM | POA: Diagnosis present

## 2023-12-31 DIAGNOSIS — R0902 Hypoxemia: Secondary | ICD-10-CM | POA: Diagnosis present

## 2023-12-31 DIAGNOSIS — Z86718 Personal history of other venous thrombosis and embolism: Secondary | ICD-10-CM

## 2023-12-31 LAB — CBC WITH DIFFERENTIAL/PLATELET
Abs Immature Granulocytes: 0.01 10*3/uL (ref 0.00–0.07)
Basophils Absolute: 0 10*3/uL (ref 0.0–0.1)
Basophils Relative: 1 %
Eosinophils Absolute: 0.2 10*3/uL (ref 0.0–0.5)
Eosinophils Relative: 4 %
HCT: 40.9 % (ref 36.0–46.0)
Hemoglobin: 12.8 g/dL (ref 12.0–15.0)
Immature Granulocytes: 0 %
Lymphocytes Relative: 33 %
Lymphs Abs: 1.4 10*3/uL (ref 0.7–4.0)
MCH: 29 pg (ref 26.0–34.0)
MCHC: 31.3 g/dL (ref 30.0–36.0)
MCV: 92.5 fL (ref 80.0–100.0)
Monocytes Absolute: 0.7 10*3/uL (ref 0.1–1.0)
Monocytes Relative: 16 %
Neutro Abs: 2 10*3/uL (ref 1.7–7.7)
Neutrophils Relative %: 46 %
Platelets: 196 10*3/uL (ref 150–400)
RBC: 4.42 MIL/uL (ref 3.87–5.11)
RDW: 14.8 % (ref 11.5–15.5)
WBC: 4.3 10*3/uL (ref 4.0–10.5)
nRBC: 0 % (ref 0.0–0.2)

## 2023-12-31 LAB — I-STAT CG4 LACTIC ACID, ED
Lactic Acid, Venous: 1.9 mmol/L (ref 0.5–1.9)
Lactic Acid, Venous: 1.4 mmol/L (ref 0.5–1.9)

## 2023-12-31 LAB — COMPREHENSIVE METABOLIC PANEL WITH GFR
ALT: 15 U/L (ref 0–44)
AST: 31 U/L (ref 15–41)
Albumin: 3.5 g/dL (ref 3.5–5.0)
Alkaline Phosphatase: 57 U/L (ref 38–126)
Anion gap: 16 — ABNORMAL HIGH (ref 5–15)
BUN: 17 mg/dL (ref 8–23)
CO2: 20 mmol/L — ABNORMAL LOW (ref 22–32)
Calcium: 9.5 mg/dL (ref 8.9–10.3)
Chloride: 104 mmol/L (ref 98–111)
Creatinine, Ser: 1.93 mg/dL — ABNORMAL HIGH (ref 0.44–1.00)
GFR, Estimated: 25 mL/min — ABNORMAL LOW (ref >60)
Glucose, Bld: 88 mg/dL (ref 70–99)
Potassium: 3.6 mmol/L (ref 3.5–5.1)
Sodium: 140 mmol/L (ref 135–145)
Total Bilirubin: 1.3 mg/dL — ABNORMAL HIGH (ref 0.0–1.2)
Total Protein: 7 g/dL (ref 6.5–8.1)

## 2023-12-31 LAB — I-STAT CHEM 8, ED
BUN: 17 mg/dL (ref 8–23)
Calcium, Ion: 1.11 mmol/L — ABNORMAL LOW (ref 1.15–1.40)
Chloride: 107 mmol/L (ref 98–111)
Creatinine, Ser: 1.9 mg/dL — ABNORMAL HIGH (ref 0.44–1.00)
Glucose, Bld: 88 mg/dL (ref 70–99)
HCT: 41 % (ref 36.0–46.0)
Hemoglobin: 13.9 g/dL (ref 12.0–15.0)
Potassium: 3.5 mmol/L (ref 3.5–5.1)
Sodium: 140 mmol/L (ref 135–145)
TCO2: 20 mmol/L — ABNORMAL LOW (ref 22–32)

## 2023-12-31 LAB — TROPONIN I (HIGH SENSITIVITY)
Troponin I (High Sensitivity): 120 ng/L (ref <18)
Troponin I (High Sensitivity): 135 ng/L (ref <18)

## 2023-12-31 LAB — BRAIN NATRIURETIC PEPTIDE: B Natriuretic Peptide: 24.7 pg/mL (ref 0.0–100.0)

## 2023-12-31 LAB — GLUCOSE, CAPILLARY: Glucose-Capillary: 98 mg/dL (ref 70–99)

## 2023-12-31 MED ORDER — ACETAMINOPHEN 325 MG PO TABS
650.0000 mg | ORAL_TABLET | Freq: Four times a day (QID) | ORAL | Status: DC | PRN
  Administered 2024-01-01: 650 mg via ORAL
  Filled 2023-12-31: qty 2

## 2023-12-31 MED ORDER — SODIUM CHLORIDE 0.9% FLUSH
3.0000 mL | Freq: Two times a day (BID) | INTRAVENOUS | Status: DC
  Administered 2023-12-31 – 2024-01-04 (×6): 3 mL via INTRAVENOUS

## 2023-12-31 MED ORDER — SODIUM CHLORIDE 0.9 % IV SOLN: 250.0000 mL | INTRAVENOUS | Status: AC | PRN

## 2023-12-31 MED ORDER — TORSEMIDE 20 MG PO TABS: 10.0000 mg | ORAL_TABLET | Freq: Every day | ORAL | Status: DC | PRN

## 2023-12-31 MED ORDER — LORATADINE 10 MG PO TABS
10.0000 mg | ORAL_TABLET | Freq: Every day | ORAL | Status: DC
  Administered 2023-12-31 – 2024-01-05 (×6): 10 mg via ORAL
  Filled 2023-12-31 (×6): qty 1

## 2023-12-31 MED ORDER — SODIUM CHLORIDE 0.9 % IV SOLN
500.0000 mg | INTRAVENOUS | Status: AC
  Administered 2023-12-31 – 2024-01-02 (×3): 500 mg via INTRAVENOUS
  Filled 2023-12-31 (×2): qty 5

## 2023-12-31 MED ORDER — SODIUM CHLORIDE 0.9 % IV SOLN
500.0000 mg | INTRAVENOUS | Status: DC
  Filled 2023-12-31: qty 5

## 2023-12-31 MED ORDER — ONDANSETRON HCL 4 MG/2ML IJ SOLN
4.0000 mg | Freq: Three times a day (TID) | INTRAMUSCULAR | Status: DC | PRN
Start: 2023-12-31 — End: 2024-01-05

## 2023-12-31 MED ORDER — SODIUM CHLORIDE 0.9 % IV SOLN
1.0000 g | INTRAVENOUS | Status: DC
  Administered 2023-12-31: 1 g via INTRAVENOUS
  Filled 2023-12-31: qty 10

## 2023-12-31 MED ORDER — ROSUVASTATIN CALCIUM 5 MG PO TABS
10.0000 mg | ORAL_TABLET | Freq: Every evening | ORAL | Status: DC
  Administered 2023-12-31 – 2024-01-04 (×5): 10 mg via ORAL
  Filled 2023-12-31 (×5): qty 2

## 2023-12-31 MED ORDER — SODIUM CHLORIDE 0.9 % IV SOLN: 500.0000 mg | INTRAVENOUS | Status: DC

## 2023-12-31 MED ORDER — CEFTRIAXONE SODIUM 1 G IJ SOLR
1.0000 g | INTRAMUSCULAR | Status: AC
  Administered 2024-01-01 – 2024-01-05 (×5): 1 g via INTRAVENOUS
  Filled 2023-12-31 (×5): qty 10

## 2023-12-31 MED ORDER — SODIUM CHLORIDE 0.9 % IV BOLUS
500.0000 mL | Freq: Once | INTRAVENOUS | Status: AC
  Administered 2023-12-31: 500 mL via INTRAVENOUS

## 2023-12-31 MED ORDER — DICLOFENAC SODIUM 1 % EX GEL
2.0000 g | Freq: Four times a day (QID) | CUTANEOUS | Status: DC | PRN
  Filled 2023-12-31: qty 100

## 2023-12-31 MED ORDER — SODIUM CHLORIDE 0.9% FLUSH: 3.0000 mL | INTRAVENOUS | Status: DC | PRN

## 2023-12-31 MED ORDER — APIXABAN 5 MG PO TABS
5.0000 mg | ORAL_TABLET | Freq: Two times a day (BID) | ORAL | Status: DC
  Administered 2023-12-31 – 2024-01-05 (×10): 5 mg via ORAL
  Filled 2023-12-31 (×11): qty 1

## 2023-12-31 MED ORDER — HYDROCORTISONE 10 MG PO TABS
10.0000 mg | ORAL_TABLET | Freq: Three times a day (TID) | ORAL | Status: DC
Start: 2023-12-31 — End: 2024-01-05
  Administered 2023-12-31 – 2024-01-05 (×15): 10 mg via ORAL
  Filled 2023-12-31 (×17): qty 1

## 2023-12-31 MED ORDER — METHYLPREDNISOLONE SODIUM SUCC 125 MG IJ SOLR
125.0000 mg | Freq: Once | INTRAMUSCULAR | Status: AC
  Administered 2023-12-31: 125 mg via INTRAVENOUS
  Filled 2023-12-31: qty 2

## 2023-12-31 NOTE — ED Notes (Signed)
 Patient denies that she has to urinate, she does not want to try

## 2023-12-31 NOTE — ED Notes (Signed)
 Patient left leg is purple from the waist to lower leg, swelling is noted. Posterior  tibia was present on both legs. Dr Sharyn Deforest was called and was made aware.

## 2023-12-31 NOTE — ED Provider Notes (Signed)
 [name] AT [facility] Provider Note   CSN: [id number] Arrival date & time: 12/31/23  7253     History  Chief Complaint  Patient presents with   Weakness    Adriana Jordan is a 89 y.o. female.  89 year old female presents from home due to headache and increased weakness with vomiting.  States that this ongoing for about 3 days.  States her headache is persistent and has not using treatment for it.  Has not been associated with focal weakness.  Patient is on Eliquis  at this time.  States that she has diffuse headache.  Emesis only x 1 after she tried drinking some milk.  Denies any photophobia.  No neck pain.  Slightly short of breath.  Denies any abdominal pain.  No fever or cough according to her.  Called EMS and presents now       Home Medications Prior to Admission medications   Medication Sig Start Date End Date Taking? Authorizing Provider  apixaban  (ELIQUIS ) 5 MG TABS tablet Take 2 tablets (10 mg) by mouth twice daily-and on Thu 11/17/23-switch to 1 tablet (5 mg) by mouth twice daily 11/11/23   Ghimire, Estil Heman, MD  cetirizine  (ZYRTEC ) 10 MG tablet Take 1 tablet (10 mg total) by mouth daily. 11/11/23   Ghimire, Estil Heman, MD  diclofenac  Sodium (VOLTAREN ) 1 % GEL Apply 4 g topically as needed (shoulder pain).    [provider]  feeding supplement (ENSURE ENLIVE / ENSURE PLUS) LIQD Take 237 mLs by mouth 2 (two) times daily between meals. Patient taking differently: Take 1-2 Bottles by mouth daily as needed (skipped meal). 02/20/23   Chapman Commodore, MD  hydrocortisone  (CORTEF ) 10 MG tablet Take 2 tablets (20 mg total) by mouth every morning and 1 tablet (10mg ) by mouth every evening. 11/11/23   Ghimire, Estil Heman, MD  lidocaine  4 % Place 1 patch onto the skin daily.    [provider]  rosuvastatin  (CRESTOR ) 10 MG tablet Take 1 tablet (10 mg total) by mouth every evening. 11/11/23   Ghimire, Estil Heman, MD  torsemide  (DEMADEX ) 10 MG  tablet Take 1 tablet (10 mg total) by mouth daily as needed (fluid, swelling). 11/11/23   Ghimire, Estil Heman, MD      Allergies    Codeine    Review of Systems   Review of Systems  All other systems reviewed and are negative.   Physical Exam Updated Vital Signs BP 117/70 (BP Location: Right Arm)   Pulse (!) 101   Temp 99.3 F (37.4 C) (Oral)   Resp (!) 24   LMP  (LMP Unknown)   SpO2 97%  Physical Exam Vitals and nursing note reviewed.  Constitutional:      General: She is not in acute distress.    Appearance: Normal appearance. She is well-developed. She is not toxic-appearing.  HENT:     Head: Normocephalic and atraumatic.  Eyes:     General: Lids are normal.     Conjunctiva/sclera: Conjunctivae normal.     Pupils: Pupils are equal, round, and reactive to light.  Neck:     Thyroid : No thyroid  mass.     Trachea: No tracheal deviation.  Cardiovascular:     Rate and Rhythm: Normal rate and regular rhythm.     Heart sounds: Normal heart sounds. No murmur heard.    No gallop.  Pulmonary:     Effort: Pulmonary effort is normal. No respiratory distress.     Breath  sounds: Normal breath sounds. No stridor. No decreased breath sounds, wheezing, rhonchi or rales.  Abdominal:     General: There is no distension.     Palpations: Abdomen is soft.     Tenderness: There is no abdominal tenderness. There is no rebound.  Musculoskeletal:        General: No tenderness. Normal range of motion.     Cervical back: Normal range of motion and neck supple.  Skin:    General: Skin is warm and dry.     Findings: No abrasion or rash.  Neurological:     General: No focal deficit present.     Mental Status: She is alert and oriented to person, place, and time. Mental status is at baseline.     GCS: GCS eye subscore is 4. GCS verbal subscore is 5. GCS motor subscore is 6.     Cranial Nerves: No cranial nerve deficit.     Sensory: No sensory deficit.     Motor: Motor function is intact.   Psychiatric:        Attention and Perception: Attention normal.        Mood and Affect: Affect is flat.     ED Results / Procedures / Treatments   Labs (all labs ordered are listed, but only abnormal results are displayed) Labs Reviewed  CULTURE, BLOOD (ROUTINE X 2)  CULTURE, BLOOD (ROUTINE X 2)  CBC WITH DIFFERENTIAL/PLATELET  COMPREHENSIVE METABOLIC PANEL WITH GFR  URINALYSIS, W/ REFLEX TO CULTURE (INFECTION SUSPECTED)  I-STAT CG4 LACTIC ACID, ED  I-STAT CHEM 8, ED    EKG EKG Interpretation Date/Time:  Saturday Dec 31 2023 09:02:20 EDT Ventricular Rate:  103 PR Interval:  161 QRS Duration:  81 QT Interval:  355 QTC Calculation: 465 R Axis:   -73  Text Interpretation: Sinus tachycardia Left anterior fascicular block Consider anterior infarct Confirmed by Lind Repine ([id number]) on 12/31/2023 9:12:23 AM  Radiology No results found.  Procedures Procedures    Medications Ordered in ED Medications - No data to display  ED Course/ Medical Decision Making/ A&P                                 Medical Decision Making Amount and/or Complexity of Data Reviewed Labs: ordered. Radiology: ordered. ECG/medicine tests: ordered.  Risk Decision regarding hospitalization.   Patient's EKG shows sinus tachycardia.  Low-grade temperature noted here.  Chest x-ray concerning for pneumonia.  No leukocytosis on her CBC.  Patient does have oxygen requirement.  She does appear weak and ill.  Started on IV antibiotics and will admit to the hospital        Final Clinical Impression(s) / ED Diagnoses Final diagnoses:  None    Rx / DC Orders ED Discharge Orders     None         Lind Repine, MD 12/31/23 1044

## 2023-12-31 NOTE — Progress Notes (Signed)
 Ref: Pasqual Bone, MD   Subjective:  Low BP, tachycardic and 95 % O2 sat with mild bilateral leg edema. Patient is not sure if she missed few hydrocortisone  or Eliquis  doses.  She says her son helps her at home.  Objective:  Vital Signs in the last 24 hours: Temp:  [97.4 F (36.3 C)-99.9 F (37.7 C)] 97.4 F (36.3 C) (05/24 1743) Pulse Rate:  [101-112] 112 (05/24 1645) Cardiac Rhythm: Sinus tachycardia (05/24 1157) Resp:  [16-25] 20 (05/24 1745) BP: (79-134)/(54-70) 88/59 (05/24 1745) SpO2:  [92 %-100 %] 95 % (05/24 1743)  Physical Exam: BP Readings from Last 1 Encounters:  12/31/23 (!) 88/59     Wt Readings from Last 1 Encounters:  11/10/23 70.3 kg    Weight change:  There is no height or weight on file to calculate BMI. HEENT: [place]/AT, Eyes-Brown, Conjunctiva-Pink, Sclera-Non-icteric Neck: No JVD, No bruit, Trachea midline. Lungs:  Clearing, Bilateral. Cardiac:  Regular rhythm, normal S1 and S2, no S3. II/VI systolic murmur. Abdomen:  Soft, non-tender. BS present. Extremities:  1 + edema present. No cyanosis. No clubbing. CNS: AxOx2, Cranial nerves grossly intact, moves all 4 extremities.  Skin: Warm and dry.   Intake/Output from previous day: No intake/output data recorded.    Lab Results: BMET    Component Value Date/Time   NA 140 12/31/2023 0933   NA 140 12/31/2023 0927   NA 138 11/10/2023 1124   K 3.5 12/31/2023 0933   K 3.6 12/31/2023 0927   K 4.2 11/10/2023 1124   CL 107 12/31/2023 0933   CL 104 12/31/2023 0927   CL 102 11/10/2023 1124   CO2 20 (L) 12/31/2023 0927   CO2 23 11/10/2023 1124   CO2 25 11/09/2023 0245   GLUCOSE 88 12/31/2023 0933   GLUCOSE 88 12/31/2023 0927   GLUCOSE 179 (H) 11/10/2023 1124   BUN 17 12/31/2023 0933   BUN 17 12/31/2023 0927   BUN 22 11/10/2023 1124   CREATININE 1.90 (H) 12/31/2023 0933   CREATININE 1.93 (H) 12/31/2023 0927   CREATININE 1.45 (H) 11/10/2023 1124   CALCIUM  9.5 12/31/2023 0927   CALCIUM  10.0  11/10/2023 1124   CALCIUM  9.4 11/09/2023 0245   GFRNONAA 25 (L) 12/31/2023 0927   GFRNONAA 35 (L) 11/10/2023 1124   GFRNONAA 31 (L) 11/09/2023 0245   GFRAA 46 (L) 11/15/2017 0439   GFRAA 40 (L) 11/14/2017 1250   GFRAA 56 (L) 11/09/2017 1341   CBC    Component Value Date/Time   WBC 4.3 12/31/2023 0927   RBC 4.42 12/31/2023 0927   HGB 13.9 12/31/2023 0933   HCT 41.0 12/31/2023 0933   PLT 196 12/31/2023 0927   MCV 92.5 12/31/2023 0927   MCH 29.0 12/31/2023 0927   MCHC 31.3 12/31/2023 0927   RDW 14.8 12/31/2023 0927   LYMPHSABS 1.4 12/31/2023 0927   MONOABS 0.7 12/31/2023 0927   EOSABS 0.2 12/31/2023 0927   BASOSABS 0.0 12/31/2023 0927   HEPATIC Function Panel Recent Labs    07/30/23 2108 09/12/23 1030 10/01/23 1355 10/02/23 0425 11/08/23 1812 11/09/23 0245 12/31/23 0927  PROT 5.8*   < > 5.6*   < > 6.9 6.6 7.0  ALBUMIN  3.3*   < > 3.0*   < > 4.3 3.9 3.5  AST 32   < > 32   < > 52* 32 31  ALT 17   < > 15   < > 27 19 15   ALKPHOS 57   < > 34*   < >  59 55 57  BILIDIR 0.2  --  0.1  --   --   --   --   IBILI 0.6  --  0.6  --   --   --   --    < > = values in this interval not displayed.   HEMOGLOBIN A1C Lab Results  Component Value Date   MPG 131.24 02/19/2023   CARDIAC ENZYMES Lab Results  Component Value Date   CKTOTAL 161 10/01/2023   BNP No results for input(s): "PROBNP" in the last 8760 hours. TSH Recent Labs    07/30/23 1158 09/12/23 0930 10/01/23 1450  TSH 4.096 3.215 2.544   CHOLESTEROL No results for input(s): "CHOL" in the last 8760 hours.  Scheduled Meds:  apixaban   5 mg Oral BID   hydrocortisone   10 mg Oral TID   loratadine   10 mg Oral Daily   methylPREDNISolone  (SOLU-MEDROL ) injection  125 mg Intravenous Once   rosuvastatin   10 mg Oral QPM   sodium chloride  flush  3 mL Intravenous Q12H   Continuous Infusions:  sodium chloride      azithromycin  Stopped (12/31/23 1257)   [START ON 01/01/2024] cefTRIAXone  (ROCEPHIN )  IV     PRN Meds:.sodium  chloride, acetaminophen , diclofenac  Sodium, ondansetron  (ZOFRAN ) IV, sodium chloride  flush, torsemide   Assessment/Plan: Community acquired pneumonia H/O bilateral lower extremities DVT Syncope H/O Adrenal insufficiency CAD S/P stents HTN HLD S/P pituitary adenoma Acute on chronic renal failure  Dehydration from nausea and vomiting  Plan: Will try IV solumedrol. Continue IV antibiotics. Vascular ultrasound of both legs for DVT f/u.     LOS: 0 days   Time spent including chart review, lab review, examination, discussion with patient/Nurse : 30 min   Pasqual Bone  MD  12/31/2023, 6:32 PM

## 2023-12-31 NOTE — ED Notes (Signed)
 Bladder scan was performed at 1500 and patient had 169 in her bladder.

## 2023-12-31 NOTE — ED Triage Notes (Signed)
 BIB [facility] from home. Patient has been having headache, increased weakness, vomiting, chills that has been going on for three days.

## 2023-12-31 NOTE — H&P (Signed)
 Referring Physician: Joella Musa, MD  Adriana Jordan is an 89 y.o. female.                       Chief Complaint: Weakness  HPI: 89 years old female with PMH of Adrenal insufficiency, pituitary adenoma- s/p surgery and radiation Tx, HTN, HLD and CAD had episode of weakness.,She had nausea and vomiting after milk ingestion.  She was hypoxic in ER with chest x-ray suggestive for pneumonia. She is feeling better with supplemental oxygen. She has h/o bilateral lower extremities DVT CT head is stable. CBC is near normal. CKD III has progressed to CKD IV probably from dehydration.  Past Medical History:  Diagnosis Date   Acute respiratory failure with hypoxia (HCC) 11/15/2017   AKI (acute kidney injury) (HCC) 11/15/2017   Arthritis    "all over" (11/14/2017)   Brain tumor (HCC)    "I still have it"; not cancer (11/14/2017)   Chest pain, atypical 09/11/2021   Chronic lower back pain    Community acquired pneumonia 02/14/2023   Coronary artery disease    Dr. Sharyn Deforest   Elevated CK 11/15/2017   GERD (gastroesophageal reflux disease)    History of blood transfusion    "don't remember why" (11/14/2017)   History of radiation therapy 07/03/2018   brain, pituitary/ 12.5 Gy in 1 fraction   Hypercholesteremia    Hypertension    Influenza A 11/14/2017   MI (myocardial infarction) (HCC) 2006   PONV (postoperative nausea and vomiting)    Skin cancer    "right forearm"      Past Surgical History:  Procedure Laterality Date   ANTERIOR CERVICAL DECOMP/DISCECTOMY FUSION     BACK SURGERY     CARPAL TUNNEL RELEASE Bilateral    CORONARY ANGIOPLASTY     DES mid LAD and mid RCA 09/21/04   CRANIOTOMY N/A 05/21/2015   Procedure: Transsphenoidal resection of pituitary tumor with Dr. Starling Eck for approach;  Surgeon: Claudetta Cuba, MD;  Location: [facility] NEURO ORS;  Service: Neurosurgery;  Laterality: N/A;  Transsphenoidal resection of pituitary tumor with Dr. Starling Eck for approach   SHOULDER OPEN  ROTATOR CUFF REPAIR Bilateral    SKIN CANCER EXCISION Right    forearm   thumb surgery Right    placed srews to make straight   THYROID  SURGERY     "goiter removed"    Family History  Problem Relation Age of Onset   Cancer Father 60       throat- smoker    Breast cancer Sister 11   Cancer Brother 30       lung- smoker   Social History:  reports that she quit smoking about 54 years ago. Her smoking use included cigarettes. She started smoking about 59 years ago. She has a 0.6 pack-year smoking history. She has never used smokeless tobacco. She reports that she does not currently use alcohol. She reports that she does not use drugs.  Allergies:  Allergies  Allergen Reactions   Codeine Nausea And Vomiting    Tolerates hydrocodone     (Not in a hospital admission)   Results for orders placed or performed during the hospital encounter of 12/31/23 (from the past 48 hours)  CBC with Differential/Platelet     Status: None   Collection Time: 12/31/23  9:27 AM  Result Value Ref Range   WBC 4.3 4.0 - 10.5 K/uL   RBC 4.42 3.87 - 5.11 MIL/uL   Hemoglobin 12.8 12.0 - 15.0  g/dL   HCT 16.1 09.6 - 04.5 %   MCV 92.5 80.0 - 100.0 fL   MCH 29.0 26.0 - 34.0 pg   MCHC 31.3 30.0 - 36.0 g/dL   RDW 40.9 81.1 - 91.4 %   Platelets 196 150 - 400 K/uL   nRBC 0.0 0.0 - 0.2 %   Neutrophils Relative % 46 %   Neutro Abs 2.0 1.7 - 7.7 K/uL   Lymphocytes Relative 33 %   Lymphs Abs 1.4 0.7 - 4.0 K/uL   Monocytes Relative 16 %   Monocytes Absolute 0.7 0.1 - 1.0 K/uL   Eosinophils Relative 4 %   Eosinophils Absolute 0.2 0.0 - 0.5 K/uL   Basophils Relative 1 %   Basophils Absolute 0.0 0.0 - 0.1 K/uL   Immature Granulocytes 0 %   Abs Immature Granulocytes 0.01 0.00 - 0.07 K/uL    Comment: Performed at [facility], [address] [place], [place] [address]  Comprehensive metabolic panel with GFR     Status: Abnormal   Collection Time: 12/31/23  9:27 AM  Result Value Ref Range   Sodium 140  135 - 145 mmol/L   Potassium 3.6 3.5 - 5.1 mmol/L   Chloride 104 98 - 111 mmol/L   CO2 20 (L) 22 - 32 mmol/L   Glucose, Bld 88 70 - 99 mg/dL    Comment: Glucose reference range applies only to samples taken after fasting for at least 8 hours.   BUN 17 8 - 23 mg/dL   Creatinine, Ser 6.21 (H) 0.44 - 1.00 mg/dL   Calcium  9.5 8.9 - 10.3 mg/dL   Total Protein 7.0 6.5 - 8.1 g/dL   Albumin  3.5 3.5 - 5.0 g/dL   AST 31 15 - 41 U/L   ALT 15 0 - 44 U/L   Alkaline Phosphatase 57 38 - 126 U/L   Total Bilirubin 1.3 (H) 0.0 - 1.2 mg/dL   GFR, Estimated 25 (L) >60 mL/min    Comment: (NOTE) Calculated using the CKD-EPI Creatinine Equation (2021)    Anion gap 16 (H) 5 - 15    Comment: Performed at [facility], [address], [place], [place] [address]  I-Stat CG4 Lactic Acid     Status: None   Collection Time: 12/31/23  9:33 AM  Result Value Ref Range   Lactic Acid, Venous 1.9 0.5 - 1.9 mmol/L  I-stat chem 8, ed     Status: Abnormal   Collection Time: 12/31/23  9:33 AM  Result Value Ref Range   Sodium 140 135 - 145 mmol/L   Potassium 3.5 3.5 - 5.1 mmol/L   Chloride 107 98 - 111 mmol/L   BUN 17 8 - 23 mg/dL   Creatinine, Ser 7.84 (H) 0.44 - 1.00 mg/dL   Glucose, Bld 88 70 - 99 mg/dL    Comment: Glucose reference range applies only to samples taken after fasting for at least 8 hours.   Calcium , Ion 1.11 (L) 1.15 - 1.40 mmol/L   TCO2 20 (L) 22 - 32 mmol/L   Hemoglobin 13.9 12.0 - 15.0 g/dL   HCT 69.6 29.5 - 28.4 %  I-Stat CG4 Lactic Acid     Status: None   Collection Time: 12/31/23 11:16 AM  Result Value Ref Range   Lactic Acid, Venous 1.4 0.5 - 1.9 mmol/L   CT Head Wo Contrast Result Date: 12/31/2023 CLINICAL DATA:  Acute neurologic deficit.  Weakness. EXAM: CT HEAD WITHOUT CONTRAST TECHNIQUE: Contiguous axial images  were obtained from the base of the skull through the vertex without intravenous contrast. RADIATION DOSE REDUCTION: This exam was performed according to the departmental  dose-optimization program which includes automated exposure control, adjustment of the mA and/or kV according to patient size and/or use of iterative reconstruction technique. COMPARISON:  11/18/2023 FINDINGS: Brain: Chronic globus pallidus calcification bilaterally, unchanged and likely physiologic. Periventricular white matter and corona radiata hypodensities favor chronic ischemic microvascular white matter disease. Otherwise, the brainstem, cerebellum, cerebral peduncles, thalamus, basal ganglia, basilar cisterns, and ventricular system appear within normal limits. No intracranial hemorrhage, mass lesion, or acute CVA. Vascular: There is atherosclerotic calcification of the cavernous carotid arteries bilaterally. Skull: Unremarkable Sinuses/Orbits: Chronic right sphenoid sinusitis with complete opacification and some internal calcifications but no substantial erosion when compared to previous. Mild chronic left sphenoid sinusitis. Other: No supplemental non-categorized findings. IMPRESSION: 1. No acute intracranial findings. 2. Periventricular white matter and corona radiata hypodensities favor chronic ischemic microvascular white matter disease. 3. Right sphenoid sinusitis previously with subtotal and now with complete opacification and some internal calcifications but no substantial erosion when compared to previous. This is probably from chronic sinusitis although acute sinusitis component not excluded. Mild chronic left sphenoid sinusitis. Electronically Signed   By: Freida Jes M.D.   On: 12/31/2023 10:23   DG Chest Port 1 View Result Date: 12/31/2023 CLINICAL DATA:  Shortness of breath EXAM: PORTABLE CHEST 1 VIEW COMPARISON:  11/08/2023 FINDINGS: The patient is rotated to the right on today's radiograph, reducing diagnostic sensitivity and specificity. Atherosclerotic calcification of the aortic arch. Mildly increased interstitial accentuation in both lungs especially peripherally, without overt  cardiomegaly. Possibilities may include noncardiogenic edema or atypical pneumonia. Mild central airway thickening suggesting bronchitis or reactive airways disease. No overt airspace opacity or blunting of the costophrenic angles identified. Cervical plate and screw fixator.  Bony demineralization. IMPRESSION: 1. Mildly increased interstitial accentuation in both lungs especially peripherally, without overt cardiomegaly. Possibilities may include noncardiogenic edema or atypical pneumonia. 2. Mild central airway thickening suggesting bronchitis or reactive airways disease. 3. Bony demineralization. 4. Aortic Atherosclerosis (ICD10-I70.0). Electronically Signed   By: Freida Jes M.D.   On: 12/31/2023 10:19    Review Of Systems Constitutional: No fever, chills, weight loss or gain. Eyes: No vision change, wears glasses. No discharge or pain. Ears: No hearing loss, No tinnitus. Respiratory: Positive asthma, COPD, pneumonias, shortness of breath. No hemoptysis. Cardiovascular: H/O chest pain, palpitation, leg edema. Gastrointestinal: Positive nausea, vomiting, diarrhea, no constipation. No GI bleed. No hepatitis. Genitourinary: No dysuria, hematuria, kidney stone. No incontinance. Neurological: No headache, stroke, seizures.  Psychiatry: No psych facility admission for anxiety, depression, suicide. No detox. Skin: No rash. Musculoskeletal: Positive joint pain, fibromyalgia, neck pain, back pain. Lymphadenopathy: No lymphadenopathy. Hematology: No anemia or easy bruising.   Blood pressure 134/68, pulse (!) 103, temperature 99.9 F (37.7 C), temperature source Rectal, resp. rate 20, SpO2 100%. There is no height or weight on file to calculate BMI. General appearance: alert, cooperative, appears stated age and mild respiratory distress Head: Normocephalic, atraumatic. Eyes: Brown eyes, pink conjunctiva, corneas clear. Neck: No adenopathy, no carotid bruit, no JVD, supple, symmetrical,  trachea midline and thyroid  not enlarged. Resp: Clearing to auscultation bilaterally. Cardio: Regular rate and rhythm, S1, S2 normal, II/VI systolic murmur, no click, rub or gallop GI: Soft, non-tender; bowel sounds normal; no organomegaly. Extremities: Trace edema, no cyanosis or clubbing. Skin: Warm and dry.  Neurologic: Alert and oriented X 2, normal strength. Normal coordination and slow  gait.  Assessment/Plan Community acquired pneumonia H/O bilateral lower extremities DVT Syncope H/O Adrenal insufficiency CAD S/P stents HTN HLD S/P pituitary adenoma CKD IV Dehydration from nausea and vomiting  Plan: Agree with IV antibiotics. IV fluids as needed. Limited DNR.  Time spent: Review of old records, Lab, x-rays, EKG, other cardiac tests, examination, discussion with patient/Nurse/Pharmacy/Doctor over 70 minutes.  Darrold Emms, MD  12/31/2023, 11:32 AM

## 2023-12-31 NOTE — ED Notes (Addendum)
 Patient complain she is not feeling well, she felt warm, so temperature was taken orally it was 97.8, then one was taken rectally she was 99.0. Patient systolic blood pressure was in the 90's and HR 115. Dr Pasqual Bone was called. Order were given for patient to get normal saline 500 bolus at 100cc/hr and make sure patient receive hydrocortisone  10 mg tablet. MD order Solu-medrol  125 mg IV, troponin, BNP and Ultrasound lower extremity venous to R/O DVT. Dr Pasqual Bone came to the bedside to assess patient.

## 2023-12-31 NOTE — ED Notes (Signed)
 Went to CT

## 2024-01-01 ENCOUNTER — Encounter (HOSPITAL_COMMUNITY): Payer: Self-pay | Admitting: Cardiovascular Disease

## 2024-01-01 ENCOUNTER — Other Ambulatory Visit: Payer: Self-pay

## 2024-01-01 LAB — BLOOD CULTURE ID PANEL (REFLEXED) - BCID2

## 2024-01-01 LAB — CBC
HCT: 38.2 % (ref 36.0–46.0)
Hemoglobin: 12.2 g/dL (ref 12.0–15.0)
MCH: 29 pg (ref 26.0–34.0)
MCHC: 31.9 g/dL (ref 30.0–36.0)
MCV: 91 fL (ref 80.0–100.0)
Platelets: 171 10*3/uL (ref 150–400)
RBC: 4.2 MIL/uL (ref 3.87–5.11)
RDW: 14.6 % (ref 11.5–15.5)
WBC: 7.7 10*3/uL (ref 4.0–10.5)
nRBC: 0 % (ref 0.0–0.2)

## 2024-01-01 LAB — BASIC METABOLIC PANEL WITH GFR
Anion gap: 17 — ABNORMAL HIGH (ref 5–15)
BUN: 23 mg/dL (ref 8–23)
CO2: 17 mmol/L — ABNORMAL LOW (ref 22–32)
Calcium: 8.9 mg/dL (ref 8.9–10.3)
Chloride: 105 mmol/L (ref 98–111)
Creatinine, Ser: 2.55 mg/dL — ABNORMAL HIGH (ref 0.44–1.00)
GFR, Estimated: 18 mL/min — ABNORMAL LOW (ref >60)
Glucose, Bld: 144 mg/dL — ABNORMAL HIGH (ref 70–99)
Potassium: 3.9 mmol/L (ref 3.5–5.1)
Sodium: 139 mmol/L (ref 135–145)

## 2024-01-01 MED ORDER — POTASSIUM CHLORIDE IN NACL 40-0.9 MEQ/L-% IV SOLN
INTRAVENOUS | Status: AC
  Administered 2024-01-01: 50 mL/h via INTRAVENOUS
  Filled 2024-01-01 (×2): qty 1000

## 2024-01-01 NOTE — Plan of Care (Signed)
  Problem: Education: Goal: Knowledge of General Education information will improve Description: Including pain rating scale, medication(s)/side effects and non-pharmacologic comfort measures Outcome: Progressing   Problem: Health Behavior/Discharge Planning: Goal: Ability to manage health-related needs will improve Outcome: Progressing   Problem: Clinical Measurements: Goal: Ability to maintain clinical measurements within normal limits will improve Outcome: Progressing Goal: Diagnostic test results will improve Outcome: Progressing   Problem: Nutrition: Goal: Adequate nutrition will be maintained Outcome: Progressing   Problem: Coping: Goal: Level of anxiety will decrease Outcome: Progressing   Problem: Pain Managment: Goal: General experience of comfort will improve and/or be controlled Outcome: Progressing   Problem: Elimination: Goal: Will not experience complications related to bowel motility Outcome: Completed/Met Goal: Will not experience complications related to urinary retention Outcome: Completed/Met

## 2024-01-01 NOTE — Plan of Care (Signed)

## 2024-01-01 NOTE — Progress Notes (Signed)
 Patient is independent with a cane at times per patient. Patient was able to transfer from bed to Bedside commode with one assist. Upon transferring back to the bed. Patient required 2-3 max assist. PT consulted.

## 2024-01-01 NOTE — Progress Notes (Signed)
 Ref: Pasqual Bone, MD   Subjective:  Patient's vital signs are improving post IV fluids and IV solumedrol. Creatinine remains high.  Objective:  Vital Signs in the last 24 hours: Temp:  [97.4 F (36.3 C)-99 F (37.2 C)] 97.6 F (36.4 C) (05/25 0806) Pulse Rate:  [87-119] 87 (05/25 0553) Cardiac Rhythm: Normal sinus rhythm (05/25 0706) Resp:  [14-25] 18 (05/25 0806) BP: (79-130)/(52-86) 125/73 (05/25 0806) SpO2:  [87 %-100 %] 100 % (05/25 0553)  Physical Exam: BP Readings from Last 1 Encounters:  01/01/24 125/73     Wt Readings from Last 1 Encounters:  11/10/23 70.3 kg    Weight change:  There is no height or weight on file to calculate BMI. HEENT: [place]/AT, Eyes-Brown, Conjunctiva-Pink, Sclera-Non-icteric Neck: No JVD, No bruit, Trachea midline. Lungs:  Clearing, Bilateral. Cardiac:  Regular rhythm, normal S1 and S2, no S3. II/VI systolic murmur. Abdomen:  Soft, non-tender. BS present. Extremities:  1 + edema present. No cyanosis. Positive left leg mottling. No clubbing. CNS: AxOx2, Cranial nerves grossly intact, moves all 4 extremities.  Skin: Warm and dry.   Intake/Output from previous day: 05/24 0701 - 05/25 0700 In: 1361.4 [P.O.:1080; I.V.:33.1; IV Piggyback:248.4] Out: 50 [Urine:50]    Lab Results: BMET    Component Value Date/Time   NA 139 01/01/2024 0208   NA 140 12/31/2023 0933   NA 140 12/31/2023 0927   K 3.9 01/01/2024 0208   K 3.5 12/31/2023 0933   K 3.6 12/31/2023 0927   CL 105 01/01/2024 0208   CL 107 12/31/2023 0933   CL 104 12/31/2023 0927   CO2 17 (L) 01/01/2024 0208   CO2 20 (L) 12/31/2023 0927   CO2 23 11/10/2023 1124   GLUCOSE 144 (H) 01/01/2024 0208   GLUCOSE 88 12/31/2023 0933   GLUCOSE 88 12/31/2023 0927   BUN 23 01/01/2024 0208   BUN 17 12/31/2023 0933   BUN 17 12/31/2023 0927   CREATININE 2.55 (H) 01/01/2024 0208   CREATININE 1.90 (H) 12/31/2023 0933   CREATININE 1.93 (H) 12/31/2023 0927   CALCIUM  8.9 01/01/2024 0208   CALCIUM   9.5 12/31/2023 0927   CALCIUM  10.0 11/10/2023 1124   GFRNONAA 18 (L) 01/01/2024 0208   GFRNONAA 25 (L) 12/31/2023 0927   GFRNONAA 35 (L) 11/10/2023 1124   GFRAA 46 (L) 11/15/2017 0439   GFRAA 40 (L) 11/14/2017 1250   GFRAA 56 (L) 11/09/2017 1341   CBC    Component Value Date/Time   WBC 7.7 01/01/2024 0208   RBC 4.20 01/01/2024 0208   HGB 12.2 01/01/2024 0208   HCT 38.2 01/01/2024 0208   PLT 171 01/01/2024 0208   MCV 91.0 01/01/2024 0208   MCH 29.0 01/01/2024 0208   MCHC 31.9 01/01/2024 0208   RDW 14.6 01/01/2024 0208   LYMPHSABS 1.4 12/31/2023 0927   MONOABS 0.7 12/31/2023 0927   EOSABS 0.2 12/31/2023 0927   BASOSABS 0.0 12/31/2023 0927   HEPATIC Function Panel Recent Labs    07/30/23 2108 09/12/23 1030 10/01/23 1355 10/02/23 0425 11/08/23 1812 11/09/23 0245 12/31/23 0927  PROT 5.8*   < > 5.6*   < > 6.9 6.6 7.0  ALBUMIN  3.3*   < > 3.0*   < > 4.3 3.9 3.5  AST 32   < > 32   < > 52* 32 31  ALT 17   < > 15   < > 27 19 15   ALKPHOS 57   < > 34*   < > 59 55 57  BILIDIR 0.2  --  0.1  --   --   --   --   IBILI 0.6  --  0.6  --   --   --   --    < > = values in this interval not displayed.   HEMOGLOBIN A1C Lab Results  Component Value Date   MPG 131.24 02/19/2023   CARDIAC ENZYMES Lab Results  Component Value Date   CKTOTAL 161 10/01/2023   BNP No results for input(s): "PROBNP" in the last 8760 hours. TSH Recent Labs    07/30/23 1158 09/12/23 0930 10/01/23 1450  TSH 4.096 3.215 2.544   CHOLESTEROL No results for input(s): "CHOL" in the last 8760 hours.  Scheduled Meds:  apixaban   5 mg Oral BID   hydrocortisone   10 mg Oral TID   loratadine   10 mg Oral Daily   rosuvastatin   10 mg Oral QPM   sodium chloride  flush  3 mL Intravenous Q12H   Continuous Infusions:  sodium chloride      0.9 % NaCl with KCl 40 mEq / L 50 mL/hr at 01/01/24 0555   azithromycin  Stopped (12/31/23 1257)   cefTRIAXone  (ROCEPHIN )  IV     PRN Meds:.sodium chloride , acetaminophen ,  diclofenac  Sodium, ondansetron  (ZOFRAN ) IV, sodium chloride  flush, torsemide   Assessment/Plan: Community acquired pneumonia H/O bilateral lower extremities DVT Syncope H/O Adrenal insufficiency CAD S/P stents HTN HLD S/P pituitary adenoma Acute on chronic renal failure  Dehydration from nausea and vomiting  Plan: Continue IV fluids, IV antibiotics. PT eval and treat.   LOS: 1 day   Time spent including chart review, lab review, examination, discussion with patient/Nurse : 30 min   Pasqual Bone  MD  01/01/2024, 9:21 AM

## 2024-01-01 NOTE — Progress Notes (Signed)
 PHARMACY - PHYSICIAN COMMUNICATION CRITICAL VALUE ALERT - BLOOD CULTURE IDENTIFICATION (BCID)  Adriana Jordan is an 89 y.o. female who presented to [facility] on 12/31/2023 with a chief complaint of hypoxemia.   Assessment: Micro called with blood cultures update>>1/4 bottles with GPC but not on the panel. WBC wnl on ceftriaxone /azith. Likely a contaminant  Name of physician (or Provider) Contacted: Dr Lynnann Sartorius  Current antibiotics: Ceftriaxone [name]  Changes to prescribed antibiotics recommended:  Continue current abx  Results for orders placed or performed during the hospital encounter of 12/31/23  Blood Culture ID Panel (Reflexed) (Collected: 12/31/2023  8:40 AM)  Result Value Ref Range   Enterococcus faecalis NOT DETECTED NOT DETECTED   Enterococcus Faecium NOT DETECTED NOT DETECTED   Listeria monocytogenes NOT DETECTED NOT DETECTED   Staphylococcus species NOT DETECTED NOT DETECTED   Staphylococcus aureus (BCID) NOT DETECTED NOT DETECTED   Staphylococcus epidermidis NOT DETECTED NOT DETECTED   Staphylococcus lugdunensis NOT DETECTED NOT DETECTED   Streptococcus species NOT DETECTED NOT DETECTED   Streptococcus agalactiae NOT DETECTED NOT DETECTED   Streptococcus pneumoniae NOT DETECTED NOT DETECTED   Streptococcus pyogenes NOT DETECTED NOT DETECTED   A.calcoaceticus-baumannii NOT DETECTED NOT DETECTED   Bacteroides fragilis NOT DETECTED NOT DETECTED   Enterobacterales NOT DETECTED NOT DETECTED   Enterobacter cloacae complex NOT DETECTED NOT DETECTED   Escherichia coli NOT DETECTED NOT DETECTED   Klebsiella aerogenes NOT DETECTED NOT DETECTED   Klebsiella oxytoca NOT DETECTED NOT DETECTED   Klebsiella pneumoniae NOT DETECTED NOT DETECTED   Proteus species NOT DETECTED NOT DETECTED   Salmonella species NOT DETECTED NOT DETECTED   Serratia marcescens NOT DETECTED NOT DETECTED   Haemophilus influenzae NOT DETECTED NOT DETECTED   Neisseria meningitidis NOT DETECTED NOT  DETECTED   Pseudomonas aeruginosa NOT DETECTED NOT DETECTED   Stenotrophomonas maltophilia NOT DETECTED NOT DETECTED   Candida albicans NOT DETECTED NOT DETECTED   Candida auris NOT DETECTED NOT DETECTED   Candida glabrata NOT DETECTED NOT DETECTED   Candida krusei NOT DETECTED NOT DETECTED   Candida parapsilosis NOT DETECTED NOT DETECTED   Candida tropicalis NOT DETECTED NOT DETECTED   Cryptococcus neoformans/gattii NOT DETECTED NOT DETECTED    Ivery Marking, PharmD, BCIDP, AAHIVP, CPP Infectious Disease Pharmacist 01/01/2024 5:09 PM

## 2024-01-01 NOTE — Evaluation (Signed)
 Physical Therapy Evaluation Patient Details Name: Adriana Jordan MRN: [id number] DOB: [birth year] Today's Date: 01/01/2024  History of Present Illness  Pt is 89 year old presented to [facility] on  09/27/23 for acute respiratory failure due to PNA. PMH - Covid, pituitary adenoma s/p surgical resection and radiation, adrenal insufficiency, CVA, arthritis, syncope, HTN, CAD with remote MI, CKD, cervical spine DDD s/p ACDF.  Clinical Impression  Pt admitted with above diagnosis and presents to PT with functional limitations due to deficits listed below (See PT problem list). Pt needs skilled PT to maximize independence and safety. Pt typically is independent at home with mobility and ADL's including after hospital stay last month. Currently pt appears weaker than she has during other hospitalizations and is requiring more assist. Pt unsure if son can provide assistance at home but states she has been able to do everything for herself. I pointed out that she is weaker than on past admission. If son can provide some transitional assistance then could likely return home with HHPT. If not will need to consider other post acute venues.           If plan is discharge home, recommend the following: A little help with walking and/or transfers;A little help with bathing/dressing/bathroom;Assist for transportation;Assistance with cooking/housework   Can travel by private vehicle        Equipment Recommendations None recommended by PT  Recommendations for Other Services       Functional Status Assessment Patient has had a recent decline in their functional status and demonstrates the ability to make significant improvements in function in a reasonable and predictable amount of time.     Precautions / Restrictions Precautions Precautions: Fall Recall of Precautions/Restrictions: Impaired Restrictions Weight Bearing Restrictions Per Provider Order: No      Mobility  Bed Mobility Overal bed mobility:  Needs Assistance Bed Mobility: Supine to Sit     Supine to sit: Mod assist     General bed mobility comments: Assist to bring legs off of bed, elevate trunk into sitting and bring hips to EOB.    Transfers Overall transfer level: Needs assistance Equipment used: Rolling walker (2 wheels) Transfers: Sit to/from Stand, Bed to chair/wheelchair/BSC Sit to Stand: Min assist   Step pivot transfers: Min assist       General transfer comment: Assist to power up and for balance. Pt used walker to take steps over to chair with min assist for balance and support.    Ambulation/Gait               General Gait Details: Pt reported too fatigued after transfer to chair.  Stairs            Wheelchair Mobility     Tilt Bed    Modified Rankin (Stroke Patients Only)       Balance Overall balance assessment: Needs assistance Sitting-balance support: No upper extremity supported, Feet supported Sitting balance-Leahy Scale: Fair     Standing balance support: Bilateral upper extremity supported, During functional activity Standing balance-Leahy Scale: Poor Standing balance comment: walker and CGA for static standing                             Pertinent Vitals/Pain Pain Assessment Pain Assessment: No/denies pain    Home Living                          Prior Function  Extremity/Trunk Assessment   Upper Extremity Assessment Upper Extremity Assessment: Defer to OT evaluation    Lower Extremity Assessment Lower Extremity Assessment: Generalized weakness       Communication   Communication Communication: Impaired Factors Affecting Communication: Hearing impaired    Cognition Arousal: Alert Behavior During Therapy: WFL for tasks assessed/performed   PT - Cognitive impairments: No apparent impairments                         Following commands: Intact       Cueing Cueing Techniques:  Verbal cues, Tactile cues     General Comments General comments (skin integrity, edema, etc.): Pt on 2L O2 at start with SpO2 100%. Removed O2 and SpO2 remained >93% throughout on RA. Left O2 off.    Exercises     Assessment/Plan    PT Assessment Patient needs continued PT services  PT Problem List Decreased strength;Decreased activity tolerance;Decreased balance;Decreased mobility       PT Treatment Interventions DME instruction;Gait training;Functional mobility training;Therapeutic activities;Therapeutic exercise;Balance training;Patient/family education    PT Goals (Current goals can be found in the Care Plan section)  Acute Rehab PT Goals Patient Stated Goal: return home PT Goal Formulation: With patient Time For Goal Achievement: 01/15/24 Potential to Achieve Goals: Good    Frequency Min 2X/week     Co-evaluation               AM-PAC PT "6 Clicks" Mobility  Outcome Measure Help needed turning from your back to your side while in a flat bed without using bedrails?: A Little Help needed moving from lying on your back to sitting on the side of a flat bed without using bedrails?: A Lot Help needed moving to and from a bed to a chair (including a wheelchair)?: A Little Help needed standing up from a chair using your arms (e.g., wheelchair or bedside chair)?: A Little Help needed to walk in hospital room?: Total Help needed climbing 3-5 steps with a railing? : Total 6 Click Score: 13    End of Session Equipment Utilized During Treatment: Gait belt Activity Tolerance: Patient limited by fatigue Patient left: in chair;with call bell/phone within reach;with chair alarm set Nurse Communication: Mobility status;Other (comment) (Left O2 off) PT Visit Diagnosis: Unsteadiness on feet (R26.81);Other abnormalities of gait and mobility (R26.89);Muscle weakness (generalized) (M62.81)    Time: 1245-1311 PT Time Calculation (min) (ACUTE ONLY): 26 min   Charges:   PT  Evaluation $PT Eval Moderate Complexity: 1 Mod PT Treatments $Gait Training: 8-22 mins PT General Charges $$ ACUTE PT VISIT: 1 Visit         [facility] PT Acute Rehabilitation Services Office [phone]   Pura Browns [facility] 01/01/2024, 2:13 PM

## 2024-01-02 ENCOUNTER — Inpatient Hospital Stay (HOSPITAL_COMMUNITY)

## 2024-01-02 DIAGNOSIS — M7989 Other specified soft tissue disorders: Secondary | ICD-10-CM | POA: Diagnosis not present

## 2024-01-02 LAB — CBC WITH DIFFERENTIAL/PLATELET
Abs Immature Granulocytes: 0.04 10*3/uL (ref 0.00–0.07)
Basophils Absolute: 0 10*3/uL (ref 0.0–0.1)
Basophils Relative: 0 %
Eosinophils Absolute: 0 10*3/uL (ref 0.0–0.5)
Eosinophils Relative: 0 %
HCT: 30.9 % — ABNORMAL LOW (ref 36.0–46.0)
Hemoglobin: 10.3 g/dL — ABNORMAL LOW (ref 12.0–15.0)
Immature Granulocytes: 1 %
Lymphocytes Relative: 3 %
Lymphs Abs: 0.3 10*3/uL — ABNORMAL LOW (ref 0.7–4.0)
MCH: 29.3 pg (ref 26.0–34.0)
MCHC: 33.3 g/dL (ref 30.0–36.0)
MCV: 88 fL (ref 80.0–100.0)
Monocytes Absolute: 0.2 10*3/uL (ref 0.1–1.0)
Monocytes Relative: 3 %
Neutro Abs: 8.1 10*3/uL — ABNORMAL HIGH (ref 1.7–7.7)
Neutrophils Relative %: 93 %
Platelets: 213 10*3/uL (ref 150–400)
RBC: 3.51 MIL/uL — ABNORMAL LOW (ref 3.87–5.11)
RDW: 14.5 % (ref 11.5–15.5)
WBC: 8.6 10*3/uL (ref 4.0–10.5)
nRBC: 0 % (ref 0.0–0.2)

## 2024-01-02 LAB — COMPREHENSIVE METABOLIC PANEL WITH GFR
ALT: 16 U/L (ref 0–44)
AST: 31 U/L (ref 15–41)
Albumin: 3 g/dL — ABNORMAL LOW (ref 3.5–5.0)
Alkaline Phosphatase: 46 U/L (ref 38–126)
Anion gap: 12 (ref 5–15)
BUN: 39 mg/dL — ABNORMAL HIGH (ref 8–23)
CO2: 20 mmol/L — ABNORMAL LOW (ref 22–32)
Calcium: 8.4 mg/dL — ABNORMAL LOW (ref 8.9–10.3)
Chloride: 105 mmol/L (ref 98–111)
Creatinine, Ser: 3.16 mg/dL — ABNORMAL HIGH (ref 0.44–1.00)
GFR, Estimated: 14 mL/min — ABNORMAL LOW (ref >60)
Glucose, Bld: 184 mg/dL — ABNORMAL HIGH (ref 70–99)
Potassium: 3.8 mmol/L (ref 3.5–5.1)
Sodium: 137 mmol/L (ref 135–145)
Total Bilirubin: 0.6 mg/dL (ref 0.0–1.2)
Total Protein: 6.5 g/dL (ref 6.5–8.1)

## 2024-01-02 LAB — CULTURE, BLOOD (ROUTINE X 2)

## 2024-01-02 LAB — MAGNESIUM: Magnesium: 2 mg/dL (ref 1.7–2.4)

## 2024-01-02 MED ORDER — POTASSIUM CHLORIDE IN NACL 40-0.9 MEQ/L-% IV SOLN
INTRAVENOUS | Status: DC
  Filled 2024-01-02 (×2): qty 1000

## 2024-01-02 NOTE — Progress Notes (Signed)
 Physical Therapy Treatment Patient Details Name: Adriana Jordan MRN: [id number] DOB: 1934-12-26 Today's Date: 01/02/2024   History of Present Illness Pt is 89 yo female who presents to [facility] ED hypoxia, and bout of nausea and vomiting  chest x-ray suggestive of PNA,  . PMH - Covid, pituitary adenoma s/p surgical resection and radiation, adrenal insufficiency, CVA, arthritis, syncope, HTN, CAD with remote MI, CKD, cervical spine DDD s/p ACDF.    PT Comments  On PT entry, pt reporting increased R knee pain and how she is going to have surgery when she stops returning to the hospital.  Discussed R knee surgery and need for L LE strengthening in preparation. Pt participated in bed level exercises and before pt could get up to EoB. Transport present to take her to Vascular ultrasound. D/c plan remains appropriate. PT will continue to follow acutely.       If plan is discharge home, recommend the following: A little help with walking and/or transfers;A little help with bathing/dressing/bathroom;Assist for transportation;Assistance with cooking/housework   Can travel by private vehicle      Yes  Equipment Recommendations  None recommended by PT       Precautions / Restrictions Precautions Precautions: Fall Recall of Precautions/Restrictions: Impaired Restrictions Weight Bearing Restrictions Per Provider Order: No           Communication Communication Communication: Impaired Factors Affecting Communication: Hearing impaired  Cognition Arousal: Alert Behavior During Therapy: WFL for tasks assessed/performed   PT - Cognitive impairments: No apparent impairments                         Following commands: Intact      Cueing Cueing Techniques: Verbal cues, Tactile cues  Exercises General Exercises - Lower Extremity Ankle Circles/Pumps: AROM, Both, 20 reps Quad Sets: AROM, Left, 10 reps, Supine Gluteal Sets: AROM, Both, 10 reps, Supine Heel Slides: AROM, Left, 10 reps,  Supine Hip ABduction/ADduction: AROM, Left, 10 reps, Supine Straight Leg Raises: AROM, Left, 10 reps, Supine    General Comments General comments (skin integrity, edema, etc.): VSS on RA      Pertinent Vitals/Pain Pain Assessment Pain Assessment: Faces Faces Pain Scale: Hurts whole lot Pain Location: R knee chronic Pain Descriptors / Indicators: Discomfort, Grimacing, Guarding, Sore Pain Intervention(s): Monitored during session, Repositioned    Home Living                              PT Goals (current goals can now be found in the care plan section) Acute Rehab PT Goals Patient Stated Goal: return home PT Goal Formulation: With patient Time For Goal Achievement: 01/15/24 Potential to Achieve Goals: Good Progress towards PT goals: Progressing toward goals    Frequency    Min 2X/week      PT Plan         AM-PAC PT "6 Clicks" Mobility   Outcome Measure  Help needed turning from your back to your side while in a flat bed without using bedrails?: A Little Help needed moving from lying on your back to sitting on the side of a flat bed without using bedrails?: A Lot Help needed moving to and from a bed to a chair (including a wheelchair)?: A Little Help needed standing up from a chair using your arms (e.g., wheelchair or bedside chair)?: A Little Help needed to walk in hospital room?: Total Help needed climbing 3-5 steps  with a railing? : Total 6 Click Score: 13    End of Session Equipment Utilized During Treatment: Gait belt Activity Tolerance: Patient limited by pain Patient left: in chair;with call bell/phone within reach;with chair alarm set   PT Visit Diagnosis: Unsteadiness on feet (R26.81);Other abnormalities of gait and mobility (R26.89);Muscle weakness (generalized) (M62.81)     Time: 1610-9604 PT Time Calculation (min) (ACUTE ONLY): 19 min  Charges:    $Therapeutic Exercise: 8-22 mins PT General Charges $$ ACUTE PT VISIT: 1 Visit                      Zarianna Dicarlo B. Jewel Mortimer PT, DPT Acute Rehabilitation Services Please use secure chat or  Call Office [phone]    [facility] 01/02/2024, 4:01 PM

## 2024-01-02 NOTE — Progress Notes (Signed)
 Ref: Adriana Bone, MD   Subjective:  Feeling better and has started making urine. Creatinine is climbing up. She denies additional diarrhea.  Objective:  Vital Signs in the last 24 hours: Temp:  [97.4 F (36.3 C)-97.9 F (36.6 C)] 97.5 F (36.4 C) (05/26 1054) Pulse Rate:  [70-86] 75 (05/26 1054) Cardiac Rhythm: Normal sinus rhythm (05/26 0701) Resp:  [17-18] 18 (05/26 1054) BP: (107-119)/(51-86) 108/51 (05/26 1054) SpO2:  [93 %-99 %] 98 % (05/26 1054)  Physical Exam: BP Readings from Last 1 Encounters:  01/02/24 (!) 108/51     Wt Readings from Last 1 Encounters:  11/10/23 70.3 kg    Weight change:  Body mass index is 25.01 kg/m. HEENT: [place]/AT, Eyes-Brown, Conjunctiva-Pink, Sclera-Non-icteric Neck: No JVD, No bruit, Trachea midline. Lungs:  Clear, Bilateral. Cardiac:  Regular rhythm, normal S1 and S2, no S3. II/VI systolic murmur. Abdomen:  Soft, non-tender. BS present. Extremities:  1 + ankle edema present. No cyanosis. No clubbing. CNS: AxOx3, Cranial nerves grossly intact, moves all 4 extremities.  Skin: Warm and dry.   Intake/Output from previous day: 05/25 0701 - 05/26 0700 In: 1937.2 [P.O.:940; I.V.:647.2; IV Piggyback:350] Out: 300 [Urine:300]    Lab Results: BMET    Component Value Date/Time   NA 137 01/02/2024 0745   NA 139 01/01/2024 0208   NA 140 12/31/2023 0933   K 3.8 01/02/2024 0745   K 3.9 01/01/2024 0208   K 3.5 12/31/2023 0933   CL 105 01/02/2024 0745   CL 105 01/01/2024 0208   CL 107 12/31/2023 0933   CO2 20 (L) 01/02/2024 0745   CO2 17 (L) 01/01/2024 0208   CO2 20 (L) 12/31/2023 0927   GLUCOSE 184 (H) 01/02/2024 0745   GLUCOSE 144 (H) 01/01/2024 0208   GLUCOSE 88 12/31/2023 0933   BUN 39 (H) 01/02/2024 0745   BUN 23 01/01/2024 0208   BUN 17 12/31/2023 0933   CREATININE 3.16 (H) 01/02/2024 0745   CREATININE 2.55 (H) 01/01/2024 0208   CREATININE 1.90 (H) 12/31/2023 0933   CALCIUM  8.4 (L) 01/02/2024 0745   CALCIUM  8.9 01/01/2024  0208   CALCIUM  9.5 12/31/2023 0927   GFRNONAA 14 (L) 01/02/2024 0745   GFRNONAA 18 (L) 01/01/2024 0208   GFRNONAA 25 (L) 12/31/2023 0927   GFRAA 46 (L) 11/15/2017 0439   GFRAA 40 (L) 11/14/2017 1250   GFRAA 56 (L) 11/09/2017 1341   CBC    Component Value Date/Time   WBC 8.6 01/02/2024 0745   RBC 3.51 (L) 01/02/2024 0745   HGB 10.3 (L) 01/02/2024 0745   HCT 30.9 (L) 01/02/2024 0745   PLT 213 01/02/2024 0745   MCV 88.0 01/02/2024 0745   MCH 29.3 01/02/2024 0745   MCHC 33.3 01/02/2024 0745   RDW 14.5 01/02/2024 0745   LYMPHSABS 0.3 (L) 01/02/2024 0745   MONOABS 0.2 01/02/2024 0745   EOSABS 0.0 01/02/2024 0745   BASOSABS 0.0 01/02/2024 0745   HEPATIC Function Panel Recent Labs    07/30/23 2108 09/12/23 1030 10/01/23 1355 10/02/23 0425 11/09/23 0245 12/31/23 0927 01/02/24 0745  PROT 5.8*   < > 5.6*   < > 6.6 7.0 6.5  ALBUMIN  3.3*   < > 3.0*   < > 3.9 3.5 3.0*  AST 32   < > 32   < > 32 31 31  ALT 17   < > 15   < > 19 15 16   ALKPHOS 57   < > 34*   < > 55 57  46  BILIDIR 0.2  --  0.1  --   --   --   --   IBILI 0.6  --  0.6  --   --   --   --    < > = values in this interval not displayed.   HEMOGLOBIN A1C Lab Results  Component Value Date   MPG 131.24 02/19/2023   CARDIAC ENZYMES Lab Results  Component Value Date   CKTOTAL 161 10/01/2023   BNP No results for input(s): "PROBNP" in the last 8760 hours. TSH Recent Labs    07/30/23 1158 09/12/23 0930 10/01/23 1450  TSH 4.096 3.215 2.544   CHOLESTEROL No results for input(s): "CHOL" in the last 8760 hours.  Scheduled Meds:  apixaban   5 mg Oral BID   hydrocortisone   10 mg Oral TID   loratadine   10 mg Oral Daily   rosuvastatin   10 mg Oral QPM   sodium chloride  flush  3 mL Intravenous Q12H   Continuous Infusions:  0.9 % NaCl with KCl 40 mEq / L     azithromycin  500 mg (01/01/24 1119)   cefTRIAXone  (ROCEPHIN )  IV 1 g (01/02/24 1046)   PRN Meds:.acetaminophen , diclofenac  Sodium, ondansetron  (ZOFRAN ) IV,  sodium chloride  flush  Assessment/Plan: Community acquired pneumonia H/O bilateral lower extremities DVT Syncope H/O Adrenal insufficiency CAD S/P stents HTN HLD S/P pituitary adenoma Acute on chronic renal failure  Dehydration from nausea and vomiting  Plan: Resume IV fluids. Renal consult of Dr. Christianne Cowper     LOS: 2 days   Time spent including chart review, lab review, examination, discussion with patient?Nurse : 30 min   Adriana Bone  MD  01/02/2024, 11:50 AM

## 2024-01-02 NOTE — Progress Notes (Signed)
 BLE venous duplex has been completed.  Preliminary results given to Dr. Sharyn Deforest.    Results can be found under chart review under CV PROC. 01/02/2024 4:53 PM Emerie Vanderkolk RVT, RDMS

## 2024-01-02 NOTE — Evaluation (Signed)
 Occupational Therapy Evaluation Patient Details Name: Adriana Jordan MRN: [id number] DOB: 06/25/1935 Today's Date: 01/02/2024   History of Present Illness   Pt is 89 year old presented to [facility] [place] on  09/27/23 for acute respiratory failure due to PNA. PMH - Covid, pituitary adenoma s/p surgical resection and radiation, adrenal insufficiency, CVA, arthritis, syncope, HTN, CAD with remote MI, CKD, cervical spine DDD s/p ACDF.     Clinical Impressions Pt was ambulating with a cane and independent in bathing, dressing and toileting prior to admission She live with her supportive son who can provide 24 hour care when she returns home. Pt presents with generalized weakness, chronic knee pain and poor standing balance. She requires min assist to stand and CGA with RW to ambulate. Pt needs set up to moderate assistance for ADLs. VSS throughout session. Son in room at end of session and aware pt needs assistance to stand. Pt prefers to return home, reports not much therapy happened in SNF when she when recently and she stayed 5 days. Recommending HHOT.     If plan is discharge home, recommend the following:   A little help with walking and/or transfers;A lot of help with bathing/dressing/bathroom;Assistance with cooking/housework;Assist for transportation;Help with stairs or ramp for entrance     Functional Status Assessment   Patient has had a recent decline in their functional status and demonstrates the ability to make significant improvements in function in a reasonable and predictable amount of time.     Equipment Recommendations   None recommended by OT     Recommendations for Other Services         Precautions/Restrictions   Precautions Precautions: Fall Restrictions Weight Bearing Restrictions Per Provider Order: No     Mobility Bed Mobility Overal bed mobility: Needs Assistance Bed Mobility: Supine to Sit     Supine to sit: Min assist     General bed mobility  comments: assist to raise trunk, increased time    Transfers Overall transfer level: Needs assistance Equipment used: Rolling walker (2 wheels) Transfers: Sit to/from Stand Sit to Stand: Min assist           General transfer comment: heavy min assist to rise and steady from low bed, min from [facility], cues for hand placement      Balance Overall balance assessment: Needs assistance Sitting-balance support: No upper extremity supported, Feet supported Sitting balance-Leahy Scale: Fair     Standing balance support: Bilateral upper extremity supported, During functional activity Standing balance-Leahy Scale: Poor Standing balance comment: walker and CGA for static standing                           ADL either performed or assessed with clinical judgement   ADL Overall ADL's : Needs assistance/impaired Eating/Feeding: Independent;Sitting   Grooming: Wash/dry hands;Sitting;Set up   Upper Body Bathing: Set up;Sitting   Lower Body Bathing: Minimal assistance;Sit to/from stand   Upper Body Dressing : Set up;Sitting   Lower Body Dressing: Sit to/from stand;Moderate assistance   Toilet Transfer: Ambulation;BSC/3in1;Moderate assistance   Toileting- Clothing Manipulation and Hygiene: Set up;Sit to/from stand               Vision Baseline Vision/History: 1 Wears glasses Ability to See in Adequate Light: 0 Adequate Patient Visual Report: No change from baseline       Perception         Praxis         Pertinent Vitals/Pain  Pain Assessment Pain Assessment: Faces Faces Pain Scale: Hurts little more Pain Location: R knee chronic Pain Descriptors / Indicators: Discomfort, Grimacing, Guarding, Sore Pain Intervention(s): Monitored during session, Repositioned     Extremity/Trunk Assessment Upper Extremity Assessment Upper Extremity Assessment: Overall WFL for tasks assessed   Lower Extremity Assessment Lower Extremity Assessment: Defer to PT  evaluation   Cervical / Trunk Assessment Cervical / Trunk Assessment: Normal   Communication Communication Communication: Impaired Factors Affecting Communication: Hearing impaired   Cognition Arousal: Alert Behavior During Therapy: WFL for tasks assessed/performed               OT - Cognition Comments: some difficulty reporting timeline of recent medical events, decreased awareness of deficits                 Following commands: Intact       Cueing  General Comments   Cueing Techniques: Verbal cues  VSS on RA   Exercises     Shoulder Instructions      Home Living Family/patient expects to be discharged to:: Private residence Living Arrangements: Children (son) Available Help at Discharge: Family;Available 24 hours/day Type of Home: House Home Access: Stairs to enter [employer] of Steps: 3 Entrance Stairs-Rails: Right;Left Home Layout: One level     Bathroom Shower/Tub: Producer, television/film/video: Standard     Home Equipment: Agricultural consultant (2 wheels);Cane - single point;BSC/3in1;Shower seat          Prior Functioning/Environment Prior Level of Function : Needs assist             Mobility Comments: uses a cane in the house and a cane with a spike on the end when outside ADLs Comments: Pt states independent in self care, but son does most cooking/cleaning.    OT Problem List: Decreased strength;Impaired balance (sitting and/or standing);Decreased knowledge of use of DME or AE;Pain   OT Treatment/Interventions: Self-care/ADL training;DME and/or AE instruction;Therapeutic activities;Patient/family education;Balance training      OT Goals(Current goals can be found in the care plan section)   Acute Rehab OT Goals OT Goal Formulation: With patient Time For Goal Achievement: 01/16/24 Potential to Achieve Goals: Good ADL Goals Pt Will Perform Grooming: with supervision;standing Pt Will Perform Lower Body Bathing: with  supervision;sit to/from stand Pt Will Perform Lower Body Dressing: with supervision;sit to/from stand Pt Will Transfer to Toilet: with supervision;ambulating;bedside commode Pt Will Perform Toileting - Clothing Manipulation and hygiene: with supervision;sit to/from stand Additional ADL Goal #1: Pt will complete bed mobility mod I in preparation for ADLs.   OT Frequency:  Min 2X/week    Co-evaluation              AM-PAC OT "6 Clicks" Daily Activity     Outcome Measure Help from another person eating meals?: None Help from another person taking care of personal grooming?: A Little Help from another person toileting, which includes using toliet, bedpan, or urinal?: A Little Help from another person bathing (including washing, rinsing, drying)?: A Lot Help from another person to put on and taking off regular upper body clothing?: A Little Help from another person to put on and taking off regular lower body clothing?: A Lot 6 Click Score: 17   End of Session Equipment Utilized During Treatment: Rolling walker (2 wheels);Gait belt  Activity Tolerance: Patient tolerated treatment well Patient left: in chair;with call bell/phone within reach;with chair alarm set;with family/visitor present  OT Visit Diagnosis: Unsteadiness on feet (R26.81);Other abnormalities of  gait and mobility (R26.89);Pain;Muscle weakness (generalized) (M62.81) Pain - Right/Left: Right Pain - part of body: Knee                Time: 2841-3244 OT Time Calculation (min): 42 min Charges:  OT General Charges $OT Visit: 1 Visit OT Evaluation $OT Eval Moderate Complexity: 1 Mod OT Treatments $Self Care/Home Management : 23-37 mins  Avanell Leigh, OTR/L Acute Rehabilitation Services Office: [phone]   Jonette Nestle 01/02/2024, 12:58 PM

## 2024-01-02 NOTE — Consult Note (Signed)
  [facility]  HISTORY AND PHYSICAL  Adriana Jordan is an 89 y.o. female.    Chief Complaint: weakness  HPI: Pt is an 47F with a PMH sig for CKD 3b, adrenal adenoma, h/o bilateral LE DVT, HTN, HLD, CAD, and pituitary adenoma s/p resection who is now seen in consultation at the request of Dr Sharyn Deforest for eval and recs re: AKI.    Pt presented to [facility] for weakness on 12/31/23 in the setting of poor PO intake and was hypoxic in the ED.  CXR showed pneumonia.  Baseline Cr looks like it's around 1.4-1.9.  01/01/24 2.55--> 3.16 01/02/24, prompting evaluation.  Pt says she's making urine today-- has been once.  Additionally has been having some diarrhea.  In this setting we are asked to see.    Eating lunch today, says her breathing is much better.    PMH: Past Medical History:  Diagnosis Date   Acute respiratory failure with hypoxia (HCC) 11/15/2017   AKI (acute kidney injury) (HCC) 11/15/2017   Arthritis    "all over" (11/14/2017)   Brain tumor (HCC)    "I still have it"; not cancer (11/14/2017)   Chest pain, atypical 09/11/2021   Chronic lower back pain    Community acquired pneumonia 02/14/2023   Coronary artery disease    Dr. Sharyn Deforest   Elevated CK 11/15/2017   GERD (gastroesophageal reflux disease)    History of blood transfusion    "don't remember why" (11/14/2017)   History of radiation therapy 07/03/2018   brain, pituitary/ 12.5 Gy in 1 fraction   Hypercholesteremia    Hypertension    Influenza A 11/14/2017   MI (myocardial infarction) (HCC) 2006   PONV (postoperative nausea and vomiting)    Skin cancer    "right forearm"   PSH: Past Surgical History:  Procedure Laterality Date   ANTERIOR CERVICAL DECOMP/DISCECTOMY FUSION     BACK SURGERY     CARPAL TUNNEL RELEASE Bilateral    CORONARY ANGIOPLASTY     DES mid LAD and mid RCA 09/21/04   CRANIOTOMY N/A 05/21/2015   Procedure: Transsphenoidal resection of pituitary tumor with Dr. Starling Eck for approach;   Surgeon: Claudetta Cuba, MD;  Location: [facility] NEURO ORS;  Service: Neurosurgery;  Laterality: N/A;  Transsphenoidal resection of pituitary tumor with Dr. Starling Eck for approach   SHOULDER OPEN ROTATOR CUFF REPAIR Bilateral    SKIN CANCER EXCISION Right    forearm   thumb surgery Right    placed srews to make straight   THYROID  SURGERY     "goiter removed"    Past Medical History:  Diagnosis Date   Acute respiratory failure with hypoxia (HCC) 11/15/2017   AKI (acute kidney injury) (HCC) 11/15/2017   Arthritis    "all over" (11/14/2017)   Brain tumor (HCC)    "I still have it"; not cancer (11/14/2017)   Chest pain, atypical 09/11/2021   Chronic lower back pain    Community acquired pneumonia 02/14/2023   Coronary artery disease    Dr. Sharyn Deforest   Elevated CK 11/15/2017   GERD (gastroesophageal reflux disease)    History of blood transfusion    "don't remember why" (11/14/2017)   History of radiation therapy 07/03/2018   brain, pituitary/ 12.5 Gy in 1 fraction   Hypercholesteremia    Hypertension    Influenza A 11/14/2017   MI (myocardial infarction) (HCC) 2006   PONV (postoperative nausea and vomiting)    Skin cancer    "right forearm"  Medications:  Scheduled:  apixaban   5 mg Oral BID   hydrocortisone   10 mg Oral TID   loratadine   10 mg Oral Daily   rosuvastatin   10 mg Oral QPM   sodium chloride  flush  3 mL Intravenous Q12H    Medications Prior to Admission  Medication Sig Dispense Refill   apixaban  (ELIQUIS ) 5 MG TABS tablet Take 2 tablets (10 mg) by mouth twice daily-and on Thu 11/17/23-switch to 1 tablet (5 mg) by mouth twice daily 90 tablet 2   cetirizine  (ZYRTEC ) 10 MG tablet Take 1 tablet (10 mg total) by mouth daily. 30 tablet 1   diclofenac  Sodium (VOLTAREN ) 1 % GEL Apply 4 g topically as needed (shoulder pain).     feeding supplement (ENSURE ENLIVE / ENSURE PLUS) LIQD Take 237 mLs by mouth 2 (two) times daily between meals. (Patient taking differently: Take 1-2  Bottles by mouth daily as needed (skipped meal).) 237 mL 12   oxyCODONE  (OXY IR/ROXICODONE ) 5 MG immediate release tablet Take 5 mg by mouth 3 (three) times daily as needed.     rosuvastatin  (CRESTOR ) 10 MG tablet Take 1 tablet (10 mg total) by mouth every evening. 30 tablet 1   torsemide  (DEMADEX ) 10 MG tablet Take 1 tablet (10 mg total) by mouth daily as needed (fluid, swelling). 30 tablet 1   hydrocortisone  (CORTEF ) 10 MG tablet Take 2 tablets (20 mg total) by mouth every morning and 1 tablet (10mg ) by mouth every evening. (Patient not taking: Reported on 01/01/2024) 90 tablet 1   lidocaine  4 % Place 1 patch onto the skin daily. (Patient not taking: Reported on 01/01/2024)      ALLERGIES:   Allergies  Allergen Reactions   Codeine Nausea And Vomiting    Tolerates hydrocodone     FAM HX: Family History  Problem Relation Age of Onset   Cancer Father 80       throat- smoker    Breast cancer Sister 26   Cancer Brother 64       lung- smoker    Social History:   reports that she quit smoking about 54 years ago. Her smoking use included cigarettes. She started smoking about 59 years ago. She has a 0.6 pack-year smoking history. She has never used smokeless tobacco. She reports that she does not currently use alcohol. She reports that she does not use drugs.  ROS: ROS: all other systems are negative except as per HPI  Blood pressure (!) 108/51, pulse 75, temperature (!) 97.5 F (36.4 C), temperature source Oral, resp. rate 18, height 5\' 6"  (1.676 m), SpO2 98%. PHYSICAL EXAM: Physical Exam GEN nad, sitting in chair HEENT EOMI PERRL NECK no JVD PULM on RA, inspiratory crackles RLL CV RRR ABD soft EXT no LE edema  NEURO AAO x 3 SKIN no rashes or lesions MSK no effusions   Results for orders placed or performed during the hospital encounter of 12/31/23 (from the past 48 hours)  Troponin I (High Sensitivity)     Status: Abnormal   Collection Time: 12/31/23  6:28 PM  Result Value  Ref Range   Troponin I (High Sensitivity) 120 (HH) <18 ng/L    Comment: CRITICAL RESULT CALLED TO, READ BACK BY AND VERIFIED WITH R,ESCUTIN RN @1926  12/31/23 E,BENTON (NOTE) Elevated high sensitivity troponin I (hsTnI) values and significant  changes across serial measurements may suggest ACS but many other  chronic and acute conditions are known to elevate hsTnI results.  Refer to the "Links" section for  chest pain algorithms and additional  guidance. Performed at [facility], [address] [place], [place] [address]   Glucose, capillary     Status: None   Collection Time: 12/31/23  8:16 PM  Result Value Ref Range   Glucose-Capillary 98 70 - 99 mg/dL    Comment: Glucose reference range applies only to samples taken after fasting for at least 8 hours.  Troponin I (High Sensitivity)     Status: Abnormal   Collection Time: 12/31/23  8:51 PM  Result Value Ref Range   Troponin I (High Sensitivity) 135 (HH) <18 ng/L    Comment: CRITICAL VALUE NOTED. VALUE IS CONSISTENT WITH PREVIOUSLY REPORTED/CALLED VALUE (NOTE) Elevated high sensitivity troponin I (hsTnI) values and significant  changes across serial measurements may suggest ACS but many other  chronic and acute conditions are known to elevate hsTnI results.  Refer to the "Links" section for chest pain algorithms and additional  guidance. Performed at [facility], [address] [place], [place] [address]   Basic metabolic panel     Status: Abnormal   Collection Time: 01/01/24  2:08 AM  Result Value Ref Range   Sodium 139 135 - 145 mmol/L   Potassium 3.9 3.5 - 5.1 mmol/L   Chloride 105 98 - 111 mmol/L   CO2 17 (L) 22 - 32 mmol/L   Glucose, Bld 144 (H) 70 - 99 mg/dL    Comment: Glucose reference range applies only to samples taken after fasting for at least 8 hours.   BUN 23 8 - 23 mg/dL   Creatinine, Ser 5.63 (H) 0.44 - 1.00 mg/dL   Calcium  8.9 8.9 - 10.3 mg/dL   GFR, Estimated 18 (L) >60 mL/min    Comment:  (NOTE) Calculated using the CKD-EPI Creatinine Equation (2021)    Anion gap 17 (H) 5 - 15    Comment: Performed at [facility], [address], [place], [place] [address]  CBC     Status: None   Collection Time: 01/01/24  2:08 AM  Result Value Ref Range   WBC 7.7 4.0 - 10.5 K/uL   RBC 4.20 3.87 - 5.11 MIL/uL   Hemoglobin 12.2 12.0 - 15.0 g/dL   HCT 33.2 95.1 - 88.4 %   MCV 91.0 80.0 - 100.0 fL   MCH 29.0 26.0 - 34.0 pg   MCHC 31.9 30.0 - 36.0 g/dL   RDW 16.6 06.3 - 01.6 %   Platelets 171 150 - 400 K/uL   nRBC 0.0 0.0 - 0.2 %    Comment: Performed at [facility], [address], [place], [place] [address]  CBC with Differential/Platelet     Status: Abnormal   Collection Time: 01/02/24  7:45 AM  Result Value Ref Range   WBC 8.6 4.0 - 10.5 K/uL   RBC 3.51 (L) 3.87 - 5.11 MIL/uL   Hemoglobin 10.3 (L) 12.0 - 15.0 g/dL   HCT 23.5 (L) 57.3 - 22.0 %   MCV 88.0 80.0 - 100.0 fL   MCH 29.3 26.0 - 34.0 pg   MCHC 33.3 30.0 - 36.0 g/dL   RDW 25.4 27.0 - 62.3 %   Platelets 213 150 - 400 K/uL   nRBC 0.0 0.0 - 0.2 %   Neutrophils Relative % 93 %   Neutro Abs 8.1 (H) 1.7 - 7.7 K/uL   Lymphocytes Relative 3 %   Lymphs Abs 0.3 (L) 0.7 - 4.0 K/uL   Monocytes Relative 3 %  Monocytes Absolute 0.2 0.1 - 1.0 K/uL   Eosinophils Relative 0 %   Eosinophils Absolute 0.0 0.0 - 0.5 K/uL   Basophils Relative 0 %   Basophils Absolute 0.0 0.0 - 0.1 K/uL   Immature Granulocytes 1 %   Abs Immature Granulocytes 0.04 0.00 - 0.07 K/uL    Comment: Performed at [facility], [address] [place], [place] [address]  Comprehensive metabolic panel with GFR     Status: Abnormal   Collection Time: 01/02/24  7:45 AM  Result Value Ref Range   Sodium 137 135 - 145 mmol/L   Potassium 3.8 3.5 - 5.1 mmol/L   Chloride 105 98 - 111 mmol/L   CO2 20 (L) 22 - 32 mmol/L   Glucose, Bld 184 (H) 70 - 99 mg/dL    Comment: Glucose reference range applies only to samples taken after fasting for at least  8 hours.   BUN 39 (H) 8 - 23 mg/dL   Creatinine, Ser 9.56 (H) 0.44 - 1.00 mg/dL   Calcium  8.4 (L) 8.9 - 10.3 mg/dL   Total Protein 6.5 6.5 - 8.1 g/dL   Albumin  3.0 (L) 3.5 - 5.0 g/dL   AST 31 15 - 41 U/L   ALT 16 0 - 44 U/L   Alkaline Phosphatase 46 38 - 126 U/L   Total Bilirubin 0.6 0.0 - 1.2 mg/dL   GFR, Estimated 14 (L) >60 mL/min    Comment: (NOTE) Calculated using the CKD-EPI Creatinine Equation (2021)    Anion gap 12 5 - 15    Comment: Performed at [facility], [address] [place], [place] [address]  Magnesium      Status: None   Collection Time: 01/02/24  7:45 AM  Result Value Ref Range   Magnesium  2.0 1.7 - 2.4 mg/dL    Comment: Performed at [facility], [address], [place], [place] [address]    No results found.  Assessment/Plan  AKI on CKD 3b:  in the setting of pneumonia, diarrhea, poor PO intake  - making urine  - will send UA and renal US  to ensure no obstruction  - continue IVFs  - hoping to improve with supportive care  2.  CAP:  - on azithro and CTX  - off O2  3.  Adrenal insufficiency:  - on hydrocort   4.  Bilateral LE DVTs:  - on Eliquis   5.  Dispo: pending    Shakeria Robinette 01/02/2024, 2:47 PM

## 2024-01-02 NOTE — Plan of Care (Signed)
?  Problem: Education: ?Goal: Knowledge of General Education information will improve ?Description: Including pain rating scale, medication(s)/side effects and non-pharmacologic comfort measures ?Outcome: Progressing ?  ?Problem: Health Behavior/Discharge Planning: ?Goal: Ability to manage health-related needs will improve ?Outcome: Progressing ?  ?Problem: Clinical Measurements: ?Goal: Will remain free from infection ?Outcome: Progressing ?  ?Problem: Clinical Measurements: ?Goal: Diagnostic test results will improve ?Outcome: Progressing ?  ?Problem: Clinical Measurements: ?Goal: Respiratory complications will improve ?Outcome: Progressing ?  ?Problem: Clinical Measurements: ?Goal: Cardiovascular complication will be avoided ?Outcome: Progressing ?  ?

## 2024-01-03 LAB — URINALYSIS, W/ REFLEX TO CULTURE (INFECTION SUSPECTED)
Bilirubin Urine: NEGATIVE
Glucose, UA: NEGATIVE mg/dL
Ketones, ur: NEGATIVE mg/dL
Nitrite: NEGATIVE
Protein, ur: NEGATIVE mg/dL
Specific Gravity, Urine: 1.01 (ref 1.005–1.030)
pH: 5 (ref 5.0–8.0)

## 2024-01-03 LAB — URIC ACID: Uric Acid, Serum: 7.8 mg/dL — ABNORMAL HIGH (ref 2.5–7.1)

## 2024-01-03 LAB — RENAL FUNCTION PANEL
Albumin: 2.9 g/dL — ABNORMAL LOW (ref 3.5–5.0)
Anion gap: 7 (ref 5–15)
BUN: 30 mg/dL — ABNORMAL HIGH (ref 8–23)
CO2: 21 mmol/L — ABNORMAL LOW (ref 22–32)
Calcium: 8.2 mg/dL — ABNORMAL LOW (ref 8.9–10.3)
Chloride: 111 mmol/L (ref 98–111)
Creatinine, Ser: 2.2 mg/dL — ABNORMAL HIGH (ref 0.44–1.00)
GFR, Estimated: 21 mL/min — ABNORMAL LOW (ref >60)
Glucose, Bld: 188 mg/dL — ABNORMAL HIGH (ref 70–99)
Phosphorus: 1.8 mg/dL — ABNORMAL LOW (ref 2.5–4.6)
Potassium: 4.3 mmol/L (ref 3.5–5.1)
Sodium: 139 mmol/L (ref 135–145)

## 2024-01-03 MED ORDER — SODIUM CHLORIDE 0.9 % IV SOLN: INTRAVENOUS | Status: DC

## 2024-01-03 NOTE — Progress Notes (Signed)
 Mobility Specialist Progress Note;   01/03/24 1121  Mobility  Activity Transferred to/from [facility]  Level of Assistance Moderate assist, patient does 50-74%  Assistive Device Front wheel walker  Distance Ambulated (ft) 3 ft  Activity Response Tolerated well  Mobility Referral Yes  Mobility visit 1 Mobility  Mobility Specialist Start Time (ACUTE ONLY) 1121  Mobility Specialist Stop Time (ACUTE ONLY) 1133  Mobility Specialist Time Calculation (min) (ACUTE ONLY) 12 min   Answered pts call light requesting assistance to [facility]. Required SV for bed mobility, and ModA to stand and pivot to [facility] initially. Void successful. Used RW to assist getting back to bed from [facility], required MinA. VSS throughout. C/o R knee pain. Pt left comfortably back in bed with all needs met, alarm on. RN in room.   Janit Meline Mobility Specialist Please contact via SecureChat or [employer] [phone]

## 2024-01-03 NOTE — Progress Notes (Signed)
 NT went into pts room and changed battery in telemetry box. After she left the room pt removed telemetry leads/box. I went into patients room and she allowed me to replace telemetry but stated she was "tired of being woke up during the night for stuff like this-Im not here for my heart". Pt educated about why her heart is being monitored and importance of wearing monitor. After I left the room, Pt again removed monitor and leads. Pt now refusing to let me replace monitor at this time. She is oriented x4 O2 sat 96% on room air. Pt states "I am in my right mind-and Im not putting that back on tonight". Will have CCMD put on standby for now and re-attempt to replace monitor at a later time.

## 2024-01-03 NOTE — Progress Notes (Signed)
 Ref: Pasqual Bone, MD   Subjective:  Feeling better. Sitting up. VS stable. Creatinine improving with IV fluids. Appreciate renal consult.  Objective:  Vital Signs in the last 24 hours: Temp:  [97.5 F (36.4 C)-98.4 F (36.9 C)] 98.4 F (36.9 C) (05/27 1624) Pulse Rate:  [69-88] 73 (05/27 1624) Cardiac Rhythm: Normal sinus rhythm (05/27 0900) Resp:  [16-18] 18 (05/27 1624) BP: (121-149)/(68-86) 140/80 (05/27 1624) SpO2:  [94 %-98 %] 98 % (05/27 1624) Weight:  [91.4 kg] 91.4 kg (05/27 1624)  Physical Exam: BP Readings from Last 1 Encounters:  01/03/24 (!) 140/80     Wt Readings from Last 1 Encounters:  01/03/24 91.4 kg    Weight change:  Body mass index is 32.52 kg/m. HEENT: [place]/AT, Eyes-Brown, Conjunctiva-Pink, Sclera-Non-icteric Neck: No JVD, No bruit, Trachea midline. Lungs:  Clear, Bilateral. Cardiac:  Regular rhythm, normal S1 and S2, no S3. II/VI systolic murmur. Abdomen:  Soft, non-tender. BS present. Extremities:  1 + edema present. No cyanosis. No clubbing. CNS: AxOx3, Cranial nerves grossly intact, moves all 4 extremities.  Skin: Warm and dry.   Intake/Output from previous day: 05/26 0701 - 05/27 0700 In: 667.3 [I.V.:567.3; IV Piggyback:100] Out: 500 [Urine:500]    Lab Results: BMET    Component Value Date/Time   NA 139 01/03/2024 1051   NA 137 01/02/2024 0745   NA 139 01/01/2024 0208   K 4.3 01/03/2024 1051   K 3.8 01/02/2024 0745   K 3.9 01/01/2024 0208   CL 111 01/03/2024 1051   CL 105 01/02/2024 0745   CL 105 01/01/2024 0208   CO2 21 (L) 01/03/2024 1051   CO2 20 (L) 01/02/2024 0745   CO2 17 (L) 01/01/2024 0208   GLUCOSE 188 (H) 01/03/2024 1051   GLUCOSE 184 (H) 01/02/2024 0745   GLUCOSE 144 (H) 01/01/2024 0208   BUN 30 (H) 01/03/2024 1051   BUN 39 (H) 01/02/2024 0745   BUN 23 01/01/2024 0208   CREATININE 2.20 (H) 01/03/2024 1051   CREATININE 3.16 (H) 01/02/2024 0745   CREATININE 2.55 (H) 01/01/2024 0208   CALCIUM  8.2 (L) 01/03/2024  1051   CALCIUM  8.4 (L) 01/02/2024 0745   CALCIUM  8.9 01/01/2024 0208   GFRNONAA 21 (L) 01/03/2024 1051   GFRNONAA 14 (L) 01/02/2024 0745   GFRNONAA 18 (L) 01/01/2024 0208   GFRAA 46 (L) 11/15/2017 0439   GFRAA 40 (L) 11/14/2017 1250   GFRAA 56 (L) 11/09/2017 1341   CBC    Component Value Date/Time   WBC 8.6 01/02/2024 0745   RBC 3.51 (L) 01/02/2024 0745   HGB 10.3 (L) 01/02/2024 0745   HCT 30.9 (L) 01/02/2024 0745   PLT 213 01/02/2024 0745   MCV 88.0 01/02/2024 0745   MCH 29.3 01/02/2024 0745   MCHC 33.3 01/02/2024 0745   RDW 14.5 01/02/2024 0745   LYMPHSABS 0.3 (L) 01/02/2024 0745   MONOABS 0.2 01/02/2024 0745   EOSABS 0.0 01/02/2024 0745   BASOSABS 0.0 01/02/2024 0745   HEPATIC Function Panel Recent Labs    07/30/23 2108 09/12/23 1030 10/01/23 1355 10/02/23 0425 11/09/23 0245 12/31/23 0927 01/02/24 0745 01/03/24 1051  PROT 5.8*   < > 5.6*   < > 6.6 7.0 6.5  --   ALBUMIN  3.3*   < > 3.0*   < > 3.9 3.5 3.0* 2.9*  AST 32   < > 32   < > 32 31 31  --   ALT 17   < > 15   < >  19 15 16   --   ALKPHOS 57   < > 34*   < > 55 57 46  --   BILIDIR 0.2  --  0.1  --   --   --   --   --   IBILI 0.6  --  0.6  --   --   --   --   --    < > = values in this interval not displayed.   HEMOGLOBIN A1C Lab Results  Component Value Date   MPG 131.24 02/19/2023   CARDIAC ENZYMES Lab Results  Component Value Date   CKTOTAL 161 10/01/2023   BNP No results for input(s): "PROBNP" in the last 8760 hours. TSH Recent Labs    07/30/23 1158 09/12/23 0930 10/01/23 1450  TSH 4.096 3.215 2.544   CHOLESTEROL No results for input(s): "CHOL" in the last 8760 hours.  Scheduled Meds:  apixaban   5 mg Oral BID   hydrocortisone   10 mg Oral TID   loratadine   10 mg Oral Daily   rosuvastatin   10 mg Oral QPM   sodium chloride  flush  3 mL Intravenous Q12H   Continuous Infusions:  cefTRIAXone  (ROCEPHIN )  IV 1 g (01/03/24 1023)   PRN Meds:.acetaminophen , diclofenac  Sodium, ondansetron   (ZOFRAN ) IV, sodium chloride  flush  Assessment/Plan: Community acquired pneumonia H/O bilateral lower extremities DVT Syncope H/O Adrenal insufficiency CAD S/P stents HTN HLD S/P pituitary adenoma Acute on chronic renal failure  Dehydration from nausea and vomiting  Plan: Continue medical treatment.   LOS: 3 days   Time spent including chart review, lab review, examination, discussion with patient/Nurse : 30 min   Pasqual Bone  MD  01/03/2024, 6:33 PM

## 2024-01-03 NOTE — TOC Initial Note (Signed)
 Transition of Care [facility]) - Initial/Assessment Note    Patient Details  Name: Adriana Jordan MRN: [id number] Date of Birth: 04/27/1935  [facility]) CM/SW Contact:    Cosimo Diones, RN Phone Number: 01/03/2024, 10:49 AM  Clinical Narrative:  Patient presented for generalized weakness. PTA patients states she was from home with son. Son Irine Manning is retired and takes the patient to appointments. Patient has DME cane and rolling walker in the home. Patient states she needs right knee surgery; son feels that she will not be able to withstand the rehab. Case Manager discussed home health services with the patient; she is agreeable and she wanted me to discuss with her son. Son is agreeable to home health- no agency preference and the plan is for [facility]. Referral submitted to [facility] and start of care to begin within 24-48 hours post transition home. Son will provide transportation home. No further needs identified at this time.               Expected Discharge Plan: Home w Home Health Services Barriers to Discharge: No Barriers Identified   Patient Goals and CMS Choice Patient states their goals for this hospitalization and ongoing recovery are:: plan to return home once stable.   Choice offered to / list presented to : Adult Children (Son did not have an agency preference)      Expected Discharge Plan and Services In-house Referral: NA Discharge Planning Services: CM Consult Post Acute Care Choice: Home Health Living arrangements for the past 2 months: Single Family Home                   DME Agency: NA       HH Arranged: PT, OT HH Agency: [facility] Date [facility] Agency Contacted: 01/03/24 Time HH Agency Contacted: 1048 Representative spoke with at [facility] Agency: Randel Buss  Prior Living Arrangements/Services Living arrangements for the past 2 months: Single Family Home Lives with:: Adult Children          Need for Family Participation in Patient Care:  Yes (Comment) Care giver support system in place?: Yes (comment) Current home services: DME (cane and walker) Criminal Activity/Legal Involvement Pertinent to Current Situation/Hospitalization: No - Comment as needed  Activities of Daily Living   ADL Screening (condition at time of admission) Independently performs ADLs?: Yes (appropriate for developmental age) Is the patient deaf or have difficulty hearing?: No Does the patient have difficulty seeing, even when wearing glasses/contacts?: No Does the patient have difficulty concentrating, remembering, or making decisions?: No  Permission Sought/Granted Permission sought to share information with : Family Supports, Case Production designer, theatre/television/film, Oceanographer granted to share information with : Yes, Verbal Permission Granted     Permission granted to share info w AGENCY: [facility]        Emotional Assessment Appearance:: Appears stated age Attitude/Demeanor/Rapport: Engaged Affect (typically observed): Appropriate Orientation: : Oriented to Self, Oriented to Place, Oriented to  Time, Oriented to Situation Alcohol / Substance Use: Not Applicable Psych Involvement: No (comment)  Admission diagnosis:  Community acquired pneumonia [J18.9] Community acquired pneumonia, unspecified laterality [J18.9] Patient Active Problem List   Diagnosis Date Noted   Community acquired pneumonia 12/31/2023   Hypothermia 11/09/2023   Acute hypoxemic respiratory failure (HCC) 11/08/2023   Pneumonia 09/27/2023   CAD S/P percutaneous coronary angioplasty 09/12/2023   Hypotension 09/12/2023   Near syncope 09/12/2023   Syncope 07/30/2023   Headache 06/21/2022   Adrenal insufficiency (HCC) 03/05/2022  Protein-calorie malnutrition, severe 03/03/2022   Hypokalemia 03/01/2022   Generalized weakness 03/01/2022   Protein-calorie malnutrition, moderate (HCC) 03/01/2022   Multiple falls 03/01/2022   Tick bite 03/01/2022   Hyponatremia  02/21/2022   History of CVA (cerebrovascular accident) 10/11/2019   Leukopenia 11/15/2017   AKI (acute kidney injury) (HCC) 11/15/2017   Hypertension 11/14/2017   Hyperlipidemia 11/14/2017   GERD (gastroesophageal reflux disease) 11/14/2017   Pituitary adenoma (HCC) 04/07/2015   PCP:  Pasqual Bone, MD Pharmacy:   [facility] [place] [place] [address] [place] [address] Phone: [phone] Fax: [phone]  [facility] [address] [place] [address] Phone: [phone] Fax: [phone]     Social Drivers of Health (SDOH) Social History: SDOH Screenings   Food Insecurity: Patient Unable To Answer (01/01/2024)  Housing: Patient Unable To Answer (01/01/2024)  Transportation Needs: No Transportation Needs (01/01/2024)  Utilities: Patient Unable To Answer (01/01/2024)  Social Connections: Unknown (01/01/2024)  Tobacco Use: Medium Risk (01/01/2024)   SDOH Interventions:     Readmission Risk Interventions    02/15/2023    1:22 PM  Readmission Risk Prevention Plan  Post Dischage Appt Complete  Medication Screening Complete  Transportation Screening Complete

## 2024-01-03 NOTE — Progress Notes (Signed)
 [facility] Progress Note   Assessment/ Plan:    AKI on CKD 3b:  in the setting of pneumonia, diarrhea, poor PO intake             - making urine             - will send UA and renal US  to ensure no obstruction- none identified             - s/p IVFs              - improved with supportive care  - nothing else to add- will sign off.  Call with questions 2.  CAP:             - on azithro and CTX             - off O2   3.  Adrenal insufficiency:             - on hydrocort    4.  Bilateral LE DVTs:             - on Eliquis   - repeat dopplers suggest clot progression on L--> ? If needs hypercoag workup   5.  Dispo: pending  Subjective:    Seen in room.  Cr much better.  Feeling well.  UOP better.     Objective:   BP (!) 149/80 (BP Location: Left Arm)   Pulse 70   Temp 98.2 F (36.8 C) (Oral)   Resp 17   Ht 5\' 6"  (1.676 m)   LMP  (LMP Unknown)   SpO2 94%   BMI 25.01 kg/m   Intake/Output Summary (Last 24 hours) at 01/03/2024 1340 Last data filed at 01/03/2024 0830 Gross per 24 hour  Intake 905.31 ml  Output 500 ml  Net 405.31 ml   Weight change:   Physical Exam: GEN nad, sitting in chair HEENT EOMI PERRL NECK no JVD PULM on RA, inspiratory crackles RLL CV RRR ABD soft EXT no LE edema  NEURO AAO x 3 SKIN no rashes or lesions MSK no effusions  Imaging: VAS US  LOWER EXTREMITY VENOUS (DVT) Result Date: 01/03/2024  Lower Venous DVT Study Patient Name:  Adriana Jordan  Date of Exam:   01/02/2024 Medical Rec #: [id number]          Accession #:    [id number] Date of Birth: Jan 05, 1935          Patient Gender: F Patient Age:   89 years Exam Location:  [facility] Procedure:      VAS US  LOWER EXTREMITY VENOUS (DVT) Referring Phys: Pasqual Bone --------------------------------------------------------------------------------  Indications: Edema.  Anticoagulation: Eliquis . Limitations: Poor ultrasound/tissue interface. Comparison Study: Previous exam on  11/09/2023 was positive for DVT Performing Technologist: Arlyce Berger RVT, RDMS  Examination Guidelines: A complete evaluation includes B-mode imaging, spectral Doppler, color Doppler, and power Doppler as needed of all accessible portions of each vessel. Bilateral testing is considered an integral part of a complete examination. Limited examinations for reoccurring indications may be performed as noted. The reflux portion of the exam is performed with the patient in reverse Trendelenburg.  +--------+---------------+---------+-----------+----------+--------------------+ RIGHT   CompressibilityPhasicitySpontaneityPropertiesThrombus Aging       +--------+---------------+---------+-----------+----------+--------------------+ CFV     Partial        Yes      Yes                  Age Indeterminate    +--------+---------------+---------+-----------+----------+--------------------+ SFJ  Full                                                              +--------+---------------+---------+-----------+----------+--------------------+ FV Prox None           No       No                   Age Indeterminate    +--------+---------------+---------+-----------+----------+--------------------+ FV Mid  None           No       No                   Age Indeterminate    +--------+---------------+---------+-----------+----------+--------------------+ FV      Partial        Yes      Yes                  Age Indeterminate    Distal                                                                    +--------+---------------+---------+-----------+----------+--------------------+ PFV                    Yes      Yes                  patent by                                                                 color/doppler        +--------+---------------+---------+-----------+----------+--------------------+ POP     Partial        Yes      Yes                  Age Indeterminate     +--------+---------------+---------+-----------+----------+--------------------+ PTV     Full                                         Not well visualized  +--------+---------------+---------+-----------+----------+--------------------+ PERO    Full                                                              +--------+---------------+---------+-----------+----------+--------------------+   +---------+---------------+---------+-----------+----------+-----------------+ LEFT     CompressibilityPhasicitySpontaneityPropertiesThrombus Aging    +---------+---------------+---------+-----------+----------+-----------------+ CFV      Partial        Yes      Yes                  Acute             +---------+---------------+---------+-----------+----------+-----------------+  SFJ      Partial                                      Acute             +---------+---------------+---------+-----------+----------+-----------------+ FV Prox  Partial        Yes      Yes                  Age Indeterminate +---------+---------------+---------+-----------+----------+-----------------+ FV Mid   Partial        Yes      Yes                  Age Indeterminate +---------+---------------+---------+-----------+----------+-----------------+ FV DistalPartial        Yes      Yes                  Age Indeterminate +---------+---------------+---------+-----------+----------+-----------------+ PFV      Partial        No       Yes                  Age Indeterminate +---------+---------------+---------+-----------+----------+-----------------+ POP      Partial        Yes      Yes                  Age Indeterminate +---------+---------------+---------+-----------+----------+-----------------+ PTV      Full                                                           +---------+---------------+---------+-----------+----------+-----------------+ PERO     Partial        No       No                    Acute             +---------+---------------+---------+-----------+----------+-----------------+ Unable to visualize EIV due to position    Summary: BILATERAL: -No evidence of popliteal cyst, bilaterally. RIGHT: - Findings consistent with age indeterminate deep vein thrombosis involving the right common femoral vein, right femoral vein, and right popliteal vein.   LEFT: - Findings consistent with acute deep vein thrombosis involving the left common femoral vein, and SF junction.  - Findings consistent with age indeterminate deep vein thrombosis involving the left femoral vein, left proximal profunda vein, left popliteal vein, and left peroneal veins.  - Findings suggest new clot progression as compared to previous examination.  *See table(s) above for measurements and observations. Electronically signed by Runell Countryman on 01/03/2024 at 10:14:12 AM.    Final    US  RENAL Result Date: 01/02/2024 CLINICAL DATA:  Acute kidney insufficiency EXAM: RENAL / URINARY TRACT ULTRASOUND COMPLETE COMPARISON:  None Available. FINDINGS: Right Kidney: Renal measurements: 9.5 x 3.9 x 4.2 cm = volume: 80.4 mL. Echogenicity within normal limits. No mass or hydronephrosis visualized. Left Kidney: Renal measurements: 9.0 x 4.1 x 3.8 cm = volume: 72.3 mL. Echogenicity within normal limits. No mass or hydronephrosis visualized. Bladder: Appears normal for degree of bladder distention. Other: None. IMPRESSION: No collecting system dilatation. Electronically Signed   By: Adrianna Horde M.D.   On: 01/02/2024 19:14    Labs:  BMET Recent Labs  Lab 12/31/23 0927 12/31/23 0933 01/01/24 0208 01/02/24 0745 01/03/24 1051  NA 140 140 139 137 139  K 3.6 3.5 3.9 3.8 4.3  CL 104 107 105 105 111  CO2 20*  --  17* 20* 21*  GLUCOSE 88 88 144* 184* 188*  BUN 17 17 23  39* 30*  CREATININE 1.93* 1.90* 2.55* 3.16* 2.20*  CALCIUM  9.5  --  8.9 8.4* 8.2*  PHOS  --   --   --   --  1.8*   CBC Recent Labs  Lab 12/31/23 0927  12/31/23 0933 01/01/24 0208 01/02/24 0745  WBC 4.3  --  7.7 8.6  NEUTROABS 2.0  --   --  8.1*  HGB 12.8 13.9 12.2 10.3*  HCT 40.9 41.0 38.2 30.9*  MCV 92.5  --  91.0 88.0  PLT 196  --  171 213    Medications:     apixaban   5 mg Oral BID   hydrocortisone   10 mg Oral TID   loratadine   10 mg Oral Daily   rosuvastatin   10 mg Oral QPM   sodium chloride  flush  3 mL Intravenous Q12H    Leandra Pro, MD 01/03/2024, 1:40 PM

## 2024-01-03 NOTE — Progress Notes (Signed)
 Pt agreeable to have telemetry placed this am, but states she doesn't understand why she needs too. I attempted to re-educate her as per above note. Pt states she will talk to MD this am about this.

## 2024-01-04 LAB — RENAL FUNCTION PANEL
Albumin: 2.8 g/dL — ABNORMAL LOW (ref 3.5–5.0)
Anion gap: 7 (ref 5–15)
BUN: 28 mg/dL — ABNORMAL HIGH (ref 8–23)
CO2: 19 mmol/L — ABNORMAL LOW (ref 22–32)
Calcium: 8.1 mg/dL — ABNORMAL LOW (ref 8.9–10.3)
Chloride: 112 mmol/L — ABNORMAL HIGH (ref 98–111)
Creatinine, Ser: 1.97 mg/dL — ABNORMAL HIGH (ref 0.44–1.00)
GFR, Estimated: 24 mL/min — ABNORMAL LOW (ref >60)
Glucose, Bld: 139 mg/dL — ABNORMAL HIGH (ref 70–99)
Phosphorus: 2.3 mg/dL — ABNORMAL LOW (ref 2.5–4.6)
Potassium: 3.8 mmol/L (ref 3.5–5.1)
Sodium: 138 mmol/L (ref 135–145)

## 2024-01-04 MED ORDER — ALBUMIN HUMAN 25 % IV SOLN
12.5000 g | Freq: Once | INTRAVENOUS | Status: AC
  Administered 2024-01-04: 12.5 g via INTRAVENOUS
  Filled 2024-01-04: qty 50

## 2024-01-04 MED ORDER — SODIUM CHLORIDE 0.9 % IV SOLN: INTRAVENOUS | Status: AC

## 2024-01-04 MED ORDER — SODIUM CHLORIDE 0.9 % IV SOLN: INTRAVENOUS | Status: DC

## 2024-01-04 MED ORDER — ALLOPURINOL 100 MG PO TABS
100.0000 mg | ORAL_TABLET | Freq: Every day | ORAL | Status: DC
  Administered 2024-01-04 – 2024-01-05 (×2): 100 mg via ORAL
  Filled 2024-01-04 (×2): qty 1

## 2024-01-04 MED ORDER — OXYCODONE HCL 5 MG PO TABS
5.0000 mg | ORAL_TABLET | Freq: Two times a day (BID) | ORAL | Status: DC
  Administered 2024-01-04 – 2024-01-05 (×3): 5 mg via ORAL
  Filled 2024-01-04 (×3): qty 1

## 2024-01-04 NOTE — Progress Notes (Signed)
 Ref: Pasqual Bone, MD   Subjective:  Awake. VS stable. Patient ambulated some with walker. Creatinine is improving further to 1.97 mg.  Objective:  Vital Signs in the last 24 hours: Temp:  [97.9 F (36.6 C)-98.6 F (37 C)] 98.1 F (36.7 C) (05/28 0831) Pulse Rate:  [63-76] 69 (05/28 0831) Cardiac Rhythm: Normal sinus rhythm;Bundle branch block (05/28 0700) Resp:  [16-19] 17 (05/28 0831) BP: (117-156)/(56-90) 117/56 (05/28 0831) SpO2:  [97 %-98 %] 98 % (05/28 0831) Weight:  [91.4 kg] 91.4 kg (05/27 1624)  Physical Exam: BP Readings from Last 1 Encounters:  01/04/24 (!) 117/56     Wt Readings from Last 1 Encounters:  01/03/24 91.4 kg    Weight change:  Body mass index is 32.52 kg/m. HEENT: [place]/AT, Eyes-Brown, Conjunctiva-Pink, Sclera-Non-icteric Neck: No JVD, No bruit, Trachea midline. Lungs:  Clearing, Bilateral. Cardiac:  Regular rhythm, normal S1 and S2, no S3. II/VI systolic murmur. Abdomen:  Soft, non-tender. BS present. Extremities:  1 + edema present. No cyanosis. No clubbing. CNS: AxOx3, Cranial nerves grossly intact, moves all 4 extremities.  Skin: Warm and dry.   Intake/Output from previous day: 05/27 0701 - 05/28 0700 In: 540.5 [P.O.:238; I.V.:202.5; IV Piggyback:100] Out: -     Lab Results: BMET    Component Value Date/Time   NA 138 01/04/2024 0341   NA 139 01/03/2024 1051   NA 137 01/02/2024 0745   K 3.8 01/04/2024 0341   K 4.3 01/03/2024 1051   K 3.8 01/02/2024 0745   CL 112 (H) 01/04/2024 0341   CL 111 01/03/2024 1051   CL 105 01/02/2024 0745   CO2 19 (L) 01/04/2024 0341   CO2 21 (L) 01/03/2024 1051   CO2 20 (L) 01/02/2024 0745   GLUCOSE 139 (H) 01/04/2024 0341   GLUCOSE 188 (H) 01/03/2024 1051   GLUCOSE 184 (H) 01/02/2024 0745   BUN 28 (H) 01/04/2024 0341   BUN 30 (H) 01/03/2024 1051   BUN 39 (H) 01/02/2024 0745   CREATININE 1.97 (H) 01/04/2024 0341   CREATININE 2.20 (H) 01/03/2024 1051   CREATININE 3.16 (H) 01/02/2024 0745    CALCIUM  8.1 (L) 01/04/2024 0341   CALCIUM  8.2 (L) 01/03/2024 1051   CALCIUM  8.4 (L) 01/02/2024 0745   GFRNONAA 24 (L) 01/04/2024 0341   GFRNONAA 21 (L) 01/03/2024 1051   GFRNONAA 14 (L) 01/02/2024 0745   GFRAA 46 (L) 11/15/2017 0439   GFRAA 40 (L) 11/14/2017 1250   GFRAA 56 (L) 11/09/2017 1341   CBC    Component Value Date/Time   WBC 8.6 01/02/2024 0745   RBC 3.51 (L) 01/02/2024 0745   HGB 10.3 (L) 01/02/2024 0745   HCT 30.9 (L) 01/02/2024 0745   PLT 213 01/02/2024 0745   MCV 88.0 01/02/2024 0745   MCH 29.3 01/02/2024 0745   MCHC 33.3 01/02/2024 0745   RDW 14.5 01/02/2024 0745   LYMPHSABS 0.3 (L) 01/02/2024 0745   MONOABS 0.2 01/02/2024 0745   EOSABS 0.0 01/02/2024 0745   BASOSABS 0.0 01/02/2024 0745   HEPATIC Function Panel Recent Labs    07/30/23 2108 09/12/23 1030 10/01/23 1355 10/02/23 0425 11/09/23 0245 12/31/23 0927 01/02/24 0745 01/03/24 1051 01/04/24 0341  PROT 5.8*   < > 5.6*   < > 6.6 7.0 6.5  --   --   ALBUMIN  3.3*   < > 3.0*   < > 3.9 3.5 3.0* 2.9* 2.8*  AST 32   < > 32   < > 32 31 31  --   --  ALT 17   < > 15   < > 19 15 16   --   --   ALKPHOS 57   < > 34*   < > 55 57 46  --   --   BILIDIR 0.2  --  0.1  --   --   --   --   --   --   IBILI 0.6  --  0.6  --   --   --   --   --   --    < > = values in this interval not displayed.   HEMOGLOBIN A1C Lab Results  Component Value Date   MPG 131.24 02/19/2023   CARDIAC ENZYMES Lab Results  Component Value Date   CKTOTAL 161 10/01/2023   BNP No results for input(s): "PROBNP" in the last 8760 hours. TSH Recent Labs    07/30/23 1158 09/12/23 0930 10/01/23 1450  TSH 4.096 3.215 2.544   CHOLESTEROL No results for input(s): "CHOL" in the last 8760 hours.  Scheduled Meds:  allopurinol  100 mg Oral Daily   apixaban   5 mg Oral BID   hydrocortisone   10 mg Oral TID   loratadine   10 mg Oral Daily   oxyCODONE   5 mg Oral BID   rosuvastatin   10 mg Oral QPM   sodium chloride  flush  3 mL Intravenous  Q12H   Continuous Infusions:  sodium chloride      albumin  human     cefTRIAXone  (ROCEPHIN )  IV 1 g (01/03/24 1023)   PRN Meds:.acetaminophen , diclofenac  Sodium, ondansetron  (ZOFRAN ) IV, sodium chloride  flush  Assessment/Plan: Community acquired pneumonia H/O bilateral lower extremities DVT Syncope H/O Adrenal insufficiency CAD S/P stents HTN HLD S/P pituitary adenoma Acute on chronic renal failure  Dehydration from nausea and vomiting Hypoalbuminemia Anemia of chronic disease  Plan: IV albumin . Check iron studies.    LOS: 4 days   Time spent including chart review, lab review, examination, discussion with patient/Nurse : 30 min   Pasqual Bone  MD  01/04/2024, 8:53 AM

## 2024-01-04 NOTE — Plan of Care (Signed)

## 2024-01-04 NOTE — Progress Notes (Signed)
 Occupational Therapy Treatment Patient Details Name: Adriana Jordan MRN: [id number] DOB: 1934-10-21 Today's Date: 01/04/2024   History of present illness Pt is 89 yo female who presents to [facility] ED hypoxia, and bout of nausea and vomiting  chest x-ray suggestive of PNA,,and admitted pt also found to have acute on chronic renal failure  . PMH - Covid, pituitary adenoma s/p surgical resection and radiation, adrenal insufficiency, CVA, arthritis, syncope, HTN, CAD with remote MI, CKD, cervical spine DDD s/p ACDF.   OT comments  Min assist for bed mobility. Stood from EOB with moderate assistance. Pt unable to tolerate R knee pain for grooming at sink. RN in room at end of session with pain medication. Continues to prefer HHOT. May be able to progress OOB after pain is managed.      If plan is discharge home, recommend the following:  A lot of help with bathing/dressing/bathroom;Assistance with cooking/housework;Assist for transportation;Help with stairs or ramp for entrance;A lot of help with walking and/or transfers   Equipment Recommendations  None recommended by OT    Recommendations for Other Services      Precautions / Restrictions Precautions Precautions: Fall Restrictions Weight Bearing Restrictions Per Provider Order: No       Mobility Bed Mobility Overal bed mobility: Needs Assistance Bed Mobility: Supine to Sit, Sit to Supine     Supine to sit: Min assist Sit to supine: Min assist   General bed mobility comments: pulled up on therapist's hand to raise trunk, assisted R LE back into bed due to pain    Transfers Overall transfer level: Needs assistance Equipment used: Rolling walker (2 wheels) Transfers: Sit to/from Stand Sit to Stand: Mod assist           General transfer comment: increased time and assist to rise and steady, pt unable to ambulate to sink for grooming, returned to sitting and completed with set up at EOB     Balance Overall balance  assessment: Needs assistance   Sitting balance-Leahy Scale: Fair                                     ADL either performed or assessed with clinical judgement   ADL Overall ADL's : Needs assistance/impaired     Grooming: Oral care;Sitting;Wash/dry face Grooming Details (indicate cue type and reason): set up                                    Extremity/Trunk Assessment              Vision       Perception     Praxis     Communication Communication Communication: Impaired Factors Affecting Communication: Hearing impaired   Cognition Arousal: Alert Behavior During Therapy: WFL for tasks assessed/performed               OT - Cognition Comments: some difficulty reporting timeline of recent medical events, decreased awareness of deficits                 Following commands: Intact        Cueing   Cueing Techniques: Verbal cues, Tactile cues  Exercises      Shoulder Instructions       General Comments      Pertinent Vitals/ Pain       Pain  Assessment Pain Assessment: Faces Faces Pain Scale: Hurts whole lot Pain Location: R knee chronic Pain Descriptors / Indicators: Discomfort, Grimacing, Guarding, Sore Pain Intervention(s): Repositioned, RN gave pain meds during session  Home Living                                          Prior Functioning/Environment              Frequency  Min 2X/week        Progress Toward Goals  OT Goals(current goals can now be found in the care plan section)  Progress towards OT goals: Not progressing toward goals - comment (R knee pain)  Acute Rehab OT Goals OT Goal Formulation: With patient Time For Goal Achievement: 01/16/24 Potential to Achieve Goals: Good  Plan      Co-evaluation                 AM-PAC OT "6 Clicks" Daily Activity     Outcome Measure   Help from another person eating meals?: None Help from another person taking care  of personal grooming?: A Little Help from another person toileting, which includes using toliet, bedpan, or urinal?: A Little Help from another person bathing (including washing, rinsing, drying)?: A Lot Help from another person to put on and taking off regular upper body clothing?: A Little Help from another person to put on and taking off regular lower body clothing?: A Lot 6 Click Score: 17    End of Session Equipment Utilized During Treatment: Rolling walker (2 wheels);Gait belt  OT Visit Diagnosis: Unsteadiness on feet (R26.81);Other abnormalities of gait and mobility (R26.89);Pain;Muscle weakness (generalized) (M62.81) Pain - Right/Left: Right Pain - part of body: Knee   Activity Tolerance Patient limited by pain   Patient Left in bed;with call bell/phone within reach;with bed alarm set;with nursing/sitter in room   Nurse Communication Patient requests pain meds        Time: 2595-6387 OT Time Calculation (min): 15 min  Charges: OT General Charges $OT Visit: 1 Visit OT Treatments $Self Care/Home Management : 8-22 mins  Avanell Leigh, OTR/L Acute Rehabilitation Services Office: [phone]   Jonette Nestle 01/04/2024, 11:46 AM

## 2024-01-04 NOTE — Progress Notes (Signed)
 Physical Therapy Treatment Patient Details Name: Adriana Jordan MRN: [id number] DOB: 27-Jan-1935 Today's Date: 01/04/2024   History of Present Illness Pt is 89 yo female who presents to [facility] ED hypoxia, and bout of nausea and vomiting  chest x-ray suggestive of PNA, admitted pt also found to have acute on chronic renal failure. 5/26 venous duplex showing age indeterminate RLE DVT and LLE acute DVT with clot progression compared with previous study in April 2025. PMH - Covid, pituitary adenoma s/p surgical resection and radiation, adrenal insufficiency, CVA, arthritis, syncope, HTN, CAD with remote MI, CKD, cervical spine DDD s/p ACDF.    PT Comments  Pt received in supine, agreeable to therapy session with encouragement, pt with improved activity tolerance this date. Pt able to perform transfers from slightly elevated bed and to chair with light minA and cues for body mechanics and up to minA for higher level balance tasks during gait trial (backward stepping and obstacle navigation). Per chart review pt has 3 STE home with bil rails, plan to work on stair negotiation next session, and caregiver guarding instruction if family present. Pt continues to benefit from PT services to progress toward functional mobility goals.     If plan is discharge home, recommend the following: A little help with walking and/or transfers;A little help with bathing/dressing/bathroom;Assist for transportation;Assistance with cooking/housework   Can travel by private vehicle        Equipment Recommendations  None recommended by PT (may need to consider installing a ramp pending stair progress next session)    Recommendations for Other Services       Precautions / Restrictions Precautions Precautions: Fall Recall of Precautions/Restrictions: Impaired Precaution/Restrictions Comments: Decreased insight into DVTs and need to mobilize to prevent worsening Restrictions Weight Bearing Restrictions Per Provider Order:  No     Mobility  Bed Mobility Overal bed mobility: Needs Assistance Bed Mobility: Supine to Sit     Supine to sit: HOB elevated, Used rails, Min assist     General bed mobility comments: CGA to get legs over bed with BUE on bed rail, but needed minA to reach fully upright posture and light minA to scoot forward before standing trial.    Transfers Overall transfer level: Needs assistance Equipment used: Rolling walker (2 wheels) Transfers: Sit to/from Stand Sit to Stand: Min assist, From elevated surface           General transfer comment: From slightly elevated bed> RW and RW>chair with cushion on it, needs dense cues for improved body mechanics and UE placement.    Ambulation/Gait Ambulation/Gait assistance: Min assist Gait Distance (Feet): 60 Feet Assistive device: Rolling walker (2 wheels) Gait Pattern/deviations: Step-through pattern, Decreased stride length     Pre-gait activities: standing hip flexion x10 reps at RW with "high knees" prior to gait trial General Gait Details: DOE 2/4, SpO2 noisy signal on R hand, with portable pulse ox on L hand reading 91% on RA, however still poor signal. once in chair, new sensor placed to L hand and reading 96% and above on RA in chair with improved waveform. HR 69-70's bpm with standing/gait trial. CGA for forward stepping and turns wtih cues for proximity to RW, up to minA for backward stepping in room   Stairs Stairs:  (too fatigued to attempt post-ambulation)           Wheelchair Mobility     Tilt Bed    Modified Rankin (Stroke Patients Only)       Balance Overall balance  assessment: Needs assistance Sitting-balance support: No upper extremity supported, Feet supported Sitting balance-Leahy Scale: Fair     Standing balance support: Bilateral upper extremity supported, During functional activity Standing balance-Leahy Scale: Poor Standing balance comment: RW and up to minA for backward stepping                             Communication Communication Communication: Impaired Factors Affecting Communication: Hearing impaired  Cognition Arousal: Alert Behavior During Therapy: WFL for tasks assessed/performed   PT - Cognitive impairments: Memory                       PT - Cognition Comments: Pt has worked with this therapist previously (<1 year prior) but does not recall, pt has multiple new LE DVT but does not recall doppler from 2 days ago, decreased insight into need to mobilize more frequently, but agreeable with encouragement. Following commands: Intact      Cueing Cueing Techniques: Verbal cues, Tactile cues  Exercises General Exercises - Lower Extremity Hip Flexion/Marching: AROM, Both, 10 reps, Standing (with RW)    General Comments General comments (skin integrity, edema, etc.): see gait comments; VSS appear mostly WFL on RA, once sensor replaced on finger good signal achieved. may need another amb sats test prior to DC to ensure SpO2 WFL on RA.      Pertinent Vitals/Pain Pain Assessment Pain Assessment: Faces Faces Pain Scale: Hurts little more Pain Location: R knee chronic, pt moves better than she predicts Pain Descriptors / Indicators: Discomfort, Guarding, Sharp Pain Intervention(s): Monitored during session, Repositioned    Home Living                          Prior Function            PT Goals (current goals can now be found in the care plan section) Acute Rehab PT Goals Patient Stated Goal: return home PT Goal Formulation: With patient Time For Goal Achievement: 01/15/24 Progress towards PT goals: Progressing toward goals    Frequency    Min 2X/week      PT Plan      Co-evaluation              AM-PAC PT "6 Clicks" Mobility   Outcome Measure  Help needed turning from your back to your side while in a flat bed without using bedrails?: A Little Help needed moving from lying on your back to sitting on the side  of a flat bed without using bedrails?: A Little Help needed moving to and from a bed to a chair (including a wheelchair)?: A Little Help needed standing up from a chair using your arms (e.g., wheelchair or bedside chair)?: A Little Help needed to walk in hospital room?: A Little Help needed climbing 3-5 steps with a railing? : Total 6 Click Score: 16    End of Session Equipment Utilized During Treatment: Gait belt Activity Tolerance: Patient tolerated treatment well Patient left: in chair;with call bell/phone within reach;with chair alarm set Nurse Communication: Mobility status;Other (comment) (SpO2 inconsistent signal with activity) PT Visit Diagnosis: Unsteadiness on feet (R26.81);Other abnormalities of gait and mobility (R26.89);Muscle weakness (generalized) (M62.81)     Time: 0981-1914 PT Time Calculation (min) (ACUTE ONLY): 15 min  Charges:    $Gait Training: 8-22 mins PT General Charges $$ ACUTE PT VISIT: 1 Visit  Llana Rile., PTA Acute Rehabilitation Services Secure Chat Preferred 9a-5:30pm Office: [phone]    Arville Laughter 01/04/2024, 5:11 PM

## 2024-01-05 LAB — IRON AND TIBC
Iron: 37 ug/dL (ref 28–170)
Saturation Ratios: 11 % (ref 10.4–31.8)
TIBC: 330 ug/dL (ref 250–450)
UIBC: 293 ug/dL

## 2024-01-05 LAB — RENAL FUNCTION PANEL
Albumin: 3.1 g/dL — ABNORMAL LOW (ref 3.5–5.0)
Anion gap: 8 (ref 5–15)
BUN: 24 mg/dL — ABNORMAL HIGH (ref 8–23)
CO2: 22 mmol/L (ref 22–32)
Calcium: 8.5 mg/dL — ABNORMAL LOW (ref 8.9–10.3)
Chloride: 110 mmol/L (ref 98–111)
Creatinine, Ser: 1.82 mg/dL — ABNORMAL HIGH (ref 0.44–1.00)
GFR, Estimated: 27 mL/min — ABNORMAL LOW (ref >60)
Glucose, Bld: 127 mg/dL — ABNORMAL HIGH (ref 70–99)
Phosphorus: 2.8 mg/dL (ref 2.5–4.6)
Potassium: 3.8 mmol/L (ref 3.5–5.1)
Sodium: 140 mmol/L (ref 135–145)

## 2024-01-05 LAB — CULTURE, BLOOD (ROUTINE X 2): Culture: NO GROWTH

## 2024-01-05 LAB — FERRITIN: Ferritin: 59 ng/mL (ref 11–307)

## 2024-01-05 MED ORDER — ALLOPURINOL 100 MG PO TABS: 100.0000 mg | ORAL_TABLET | Freq: Every day | ORAL | 3 refills | Status: AC

## 2024-01-05 MED ORDER — HYDROCORTISONE 10 MG PO TABS: 10.0000 mg | ORAL_TABLET | Freq: Three times a day (TID) | ORAL | 11 refills | Status: AC

## 2024-01-05 NOTE — Plan of Care (Signed)
  Problem: Clinical Measurements: Goal: Ability to maintain clinical measurements within normal limits will improve Outcome: Progressing Goal: Cardiovascular complication will be avoided Outcome: Progressing   Problem: Activity: Goal: Risk for activity intolerance will decrease Outcome: Progressing   Problem: Pain Managment: Goal: General experience of comfort will improve and/or be controlled Outcome: Progressing

## 2024-01-05 NOTE — Plan of Care (Signed)
  Problem: Education: Goal: Knowledge of General Education information will improve Description: Including pain rating scale, medication(s)/side effects and non-pharmacologic comfort measures 01/05/2024 1040 by Sunnie England, RN Outcome: Adequate for Discharge 01/05/2024 1040 by Sunnie England, RN Outcome: Adequate for Discharge   Problem: Health Behavior/Discharge Planning: Goal: Ability to manage health-related needs will improve 01/05/2024 1040 by Sunnie England, RN Outcome: Adequate for Discharge 01/05/2024 1040 by Sunnie England, RN Outcome: Adequate for Discharge   Problem: Clinical Measurements: Goal: Ability to maintain clinical measurements within normal limits will improve 01/05/2024 1040 by Sunnie England, RN Outcome: Adequate for Discharge 01/05/2024 1040 by Sunnie England, RN Outcome: Adequate for Discharge Goal: Will remain free from infection 01/05/2024 1040 by Sunnie England, RN Outcome: Adequate for Discharge 01/05/2024 1040 by Sunnie England, RN Outcome: Adequate for Discharge Goal: Diagnostic test results will improve 01/05/2024 1040 by Sunnie England, RN Outcome: Adequate for Discharge 01/05/2024 1040 by Sunnie England, RN Outcome: Adequate for Discharge Goal: Respiratory complications will improve 01/05/2024 1040 by Sunnie England, RN Outcome: Adequate for Discharge 01/05/2024 1040 by Sunnie England, RN Outcome: Adequate for Discharge Goal: Cardiovascular complication will be avoided 01/05/2024 1040 by Sunnie England, RN Outcome: Adequate for Discharge 01/05/2024 1040 by Sunnie England, RN Outcome: Adequate for Discharge   Problem: Activity: Goal: Risk for activity intolerance will decrease 01/05/2024 1040 by Sunnie England, RN Outcome: Adequate for Discharge 01/05/2024 1040 by Sunnie England, RN Outcome: Adequate for Discharge   Problem: Nutrition: Goal: Adequate nutrition will be  maintained 01/05/2024 1040 by Sunnie England, RN Outcome: Adequate for Discharge 01/05/2024 1040 by Sunnie England, RN Outcome: Adequate for Discharge   Problem: Coping: Goal: Level of anxiety will decrease 01/05/2024 1040 by Sunnie England, RN Outcome: Adequate for Discharge 01/05/2024 1040 by Sunnie England, RN Outcome: Adequate for Discharge   Problem: Pain Managment: Goal: General experience of comfort will improve and/or be controlled 01/05/2024 1040 by Sunnie England, RN Outcome: Adequate for Discharge 01/05/2024 1040 by Sunnie England, RN Outcome: Adequate for Discharge   Problem: Safety: Goal: Ability to remain free from injury will improve 01/05/2024 1040 by Sunnie England, RN Outcome: Adequate for Discharge 01/05/2024 1040 by Sunnie England, RN Outcome: Adequate for Discharge   Problem: Skin Integrity: Goal: Risk for impaired skin integrity will decrease 01/05/2024 1040 by Sunnie England, RN Outcome: Adequate for Discharge 01/05/2024 1040 by Sunnie England, RN Outcome: Adequate for Discharge

## 2024-01-05 NOTE — Progress Notes (Signed)
 Physical Therapy Treatment Patient Details Name: Adriana Jordan MRN: [id number] DOB: 07/08/1935 Today's Date: 01/05/2024   History of Present Illness Pt is 89 yo female who presents to [facility] ED hypoxia, and bout of nausea and vomiting  chest x-ray suggestive of PNA, admitted pt also found to have acute on chronic renal failure. 5/26 venous duplex showing age indeterminate RLE DVT and LLE acute DVT with clot progression compared with previous study in April 2025. PMH - Covid, pituitary adenoma s/p surgical resection and radiation, adrenal insufficiency, CVA, arthritis, syncope, HTN, CAD with remote MI, CKD, cervical spine DDD s/p ACDF.    PT Comments  Pt resting in bed on arrival and agreeable to session with continued progress towards acute goals. Pt able to come to standing this session without physical assist, from bed in lowest position, repeatedly throughout session. Pt demonstrating safe step negotiation with two hands on single rail with sideways technique and light cues for sequencing as pt with chronic R knee pain. Pt was educated on continued walker use to maximize functional independence, safety, and decrease risk for falls.  Pt continues to benefit from skilled PT services to progress toward functional mobility goals.     If plan is discharge home, recommend the following: A little help with walking and/or transfers;A little help with bathing/dressing/bathroom;Assist for transportation;Assistance with cooking/housework   Can travel by private vehicle        Equipment Recommendations  None recommended by PT (may need to consider installing a ramp pending stair progress next session)    Recommendations for Other Services       Precautions / Restrictions Precautions Precautions: Fall Recall of Precautions/Restrictions: Impaired Precaution/Restrictions Comments: Decreased insight into DVTs and need to mobilize to prevent worsening Restrictions Weight Bearing Restrictions Per  Provider Order: No     Mobility  Bed Mobility Overal bed mobility: Needs Assistance Bed Mobility: Supine to Sit     Supine to sit: HOB elevated, Used rails, Contact guard     General bed mobility comments: CGA for safety    Transfers Overall transfer level: Needs assistance Equipment used: Rolling walker (2 wheels) Transfers: Sit to/from Stand Sit to Stand: Min assist, Contact guard assist           General transfer comment: CGA for repeated stands to don clothes, light min A with fatigue to shift weight anterior, able to complete x6 during session    Ambulation/Gait Ambulation/Gait assistance: Contact guard assist Gait Distance (Feet): 20 Feet Assistive device: Rolling walker (2 wheels) Gait Pattern/deviations: Step-through pattern, Decreased stride length       General Gait Details: short in room gait for focus on stair training, no LOB   Stairs Stairs: Yes Stairs assistance: Contact guard assist Stair Management: One rail Right, Step to pattern, Sideways Number of Stairs: 3 General stair comments: up down with sideways technique without fault, light cues for sequencing due to chronic R knee pain   Wheelchair Mobility     Tilt Bed    Modified Rankin (Stroke Patients Only)       Balance Overall balance assessment: Needs assistance Sitting-balance support: No upper extremity supported, Feet supported Sitting balance-Leahy Scale: Fair     Standing balance support: Bilateral upper extremity supported, During functional activity Standing balance-Leahy Scale: Poor Standing balance comment: reliant on UE supprot                            Communication Communication Communication: Impaired  Factors Affecting Communication: Hearing impaired  Cognition Arousal: Alert Behavior During Therapy: WFL for tasks assessed/performed                             Following commands: Intact      Cueing Cueing Techniques: Verbal cues,  Tactile cues  Exercises      General Comments        Pertinent Vitals/Pain Pain Assessment Pain Assessment: Faces Faces Pain Scale: Hurts a little bit Pain Location: R knee chronic Pain Descriptors / Indicators: Discomfort, Guarding, Sharp Pain Intervention(s): Monitored during session, Limited activity within patient's tolerance    Home Living                          Prior Function            PT Goals (current goals can now be found in the care plan section) Acute Rehab PT Goals Patient Stated Goal: return home PT Goal Formulation: With patient Time For Goal Achievement: 01/15/24 Progress towards PT goals: Progressing toward goals    Frequency    Min 2X/week      PT Plan      Co-evaluation              AM-PAC PT "6 Clicks" Mobility   Outcome Measure  Help needed turning from your back to your side while in a flat bed without using bedrails?: A Little Help needed moving from lying on your back to sitting on the side of a flat bed without using bedrails?: A Little Help needed moving to and from a bed to a chair (including a wheelchair)?: A Little Help needed standing up from a chair using your arms (e.g., wheelchair or bedside chair)?: A Little Help needed to walk in hospital room?: A Little Help needed climbing 3-5 steps with a railing? : A Little 6 Click Score: 18    End of Session   Activity Tolerance: Patient tolerated treatment well Patient left: in chair;with call bell/phone within reach;with nursing/sitter in room Nurse Communication: Mobility status PT Visit Diagnosis: Unsteadiness on feet (R26.81);Other abnormalities of gait and mobility (R26.89);Muscle weakness (generalized) (M62.81)     Time: 6948-5462 PT Time Calculation (min) (ACUTE ONLY): 25 min  Charges:    $Gait Training: 8-22 mins $Therapeutic Activity: 8-22 mins PT General Charges $$ ACUTE PT VISIT: 1 Visit                     Carnell Beavers R. PTA Acute Rehabilitation  Services Office: [phone]   Agapito Horseman 01/05/2024, 12:00 PM

## 2024-01-05 NOTE — Care Management Important Message (Signed)
 Important Message  Patient Details  Name: Adriana Jordan MRN: [id number] Date of Birth: 03/28/1935   Important Message Given:  Yes - Medicare IM     Janith Melnick 01/05/2024, 11:01 AM

## 2024-01-05 NOTE — Discharge Summary (Signed)
 Physician Discharge Summary  Patient ID: Adriana Jordan MRN: [id number] DOB/AGE: May 15, 1935 89 y.o.  Admit date: 12/31/2023 Discharge date: 01/05/2024  Admission Diagnoses: Community acquired pneumonia Acute on chronic bilateral lower extremities DVT Syncope H/O Adrenal insufficiency CAD S/P stents HTN HLD S/P pituitary adenoma CKD IV Dehydration from nausea and vomiting  Discharge Diagnoses:  Principle Problem:   Community acquired pneumonia Active problems: Bilateral lower extremities DVT Syncope Adrenal insufficiency CAD S/P stents HTN HLD S/P pituitary adenoma and surgery CKD IV, improving  Dehydration from nausea and vomiting, improving Hypoalbuminemia Anemia of chronic disease Degenerative joint disease  Discharged Condition: good  Hospital Course: 89 years old female with PMH of Adrenal insufficiency, pituitary adenoma- s/p surgery and radiation Tx, HTN, HLD and CAD had episode of weakness.,She had nausea and vomiting after milk ingestion.  She was hypoxic in ER with chest x-ray suggestive for pneumonia. She is feeling better with supplemental oxygen. She has h/o bilateral lower extremities DVT CT head was stable. CBC is near normal. CKD III has progressed to CKD IV probably from dehydration and post hydration is improving. IV antibiotics were given for possible pneumonia and hypoxia. PT consult was made for weakness and patient has improving ambulation. Patient was reminded to take Hydrocortisone  and Eliquis  regularly. She refused SNF admission. She will see me in 1 week.  Consults: nephrology  Significant Diagnostic Studies: labs: Near normal CBC with mild drop in hemoglobin post hydration. CMET near normal except elevated sugar, creatinine of 1.82 mg. And albumin  of 3.1 gm. Uric acid was elevated at 7.8 mg. Iron studies were near normal. Troponin I levels were minimally elevated and trending downward from a month ago level. BNP was normal at 24.7  pg.  US  of kidneys were unremarkable. CT head without acute finding. CXR was suggestive of bilateral pneumonia.  Treatments: IV hydration. IV antibiotics: ceftriaxone  and azithromycin   Discharge Exam: Blood pressure 111/72, pulse 77, temperature (!) 97.3 F (36.3 C), temperature source Oral, resp. rate 19, height 5\' 6"  (1.676 m), weight 91.4 kg, SpO2 97%. General appearance: alert, cooperative and appears stated age. Head: Normocephalic, atraumatic. Eyes: Brown eyes, pink conjunctiva, corneas clear.  Neck: No adenopathy, no carotid bruit, no JVD, supple, symmetrical, trachea midline and thyroid  not enlarged. Resp: Clearing to auscultation bilaterally. Cardio: Regular rate and rhythm, S1, S2 normal, II/VI systolic murmur, no click, rub or gallop. GI: Soft, non-tender; bowel sounds normal; no organomegaly. Extremities: Trace edema, no cyanosis or clubbing. Skin: Warm and dry.  Neurologic: Alert and oriented X 3, normal strength and tone. Normal coordination and slow gait with walker.  Disposition: Discharge disposition: 01-Home or Self Care        Allergies as of 01/05/2024       Reactions   Codeine Nausea And Vomiting   Tolerates hydrocodone         Medication List     STOP taking these medications    lidocaine  4 %   torsemide  10 MG tablet Commonly known as: DEMADEX        TAKE these medications    allopurinol 100 MG tablet Commonly known as: ZYLOPRIM Take 1 tablet (100 mg total) by mouth daily.   cetirizine  10 MG tablet Commonly known as: ZYRTEC  Take 1 tablet (10 mg total) by mouth daily.   diclofenac  Sodium 1 % Gel Commonly known as: VOLTAREN  Apply 4 g topically as needed (shoulder pain).   Eliquis  5 MG Tabs tablet Generic drug: apixaban  Take 2 tablets (10 mg) by mouth twice daily-and on  Thu 11/17/23-switch to 1 tablet (5 mg) by mouth twice daily   feeding supplement Liqd Take 237 mLs by mouth 2 (two) times daily between meals. What changed:  how  much to take when to take this reasons to take this   hydrocortisone  10 MG tablet Commonly known as: CORTEF  Take 1 tablet (10 mg total) by mouth 3 (three) times daily.   oxyCODONE  5 MG immediate release tablet Commonly known as: Oxy IR/ROXICODONE  Take 5 mg by mouth 3 (three) times daily as needed.   rosuvastatin  10 MG tablet Commonly known as: CRESTOR  Take 1 tablet (10 mg total) by mouth every evening.        Follow-up Information     [facility] Follow up.   Specialty: Home Health Services Why: Home Health Physical and Occupational Therapy Contact information: [address] [place] [address] [phone]         Pasqual Bone, MD Follow up in 1 week(s).   Specialty: Cardiology Contact information: [address] [place] [address] [phone]                 Time spent: Review of old chart, current chart, lab, x-ray, cardiac tests and discussion with patient over 60 minutes.  Signed: Darrold Emms 01/05/2024, 9:57 AM

## 2024-01-09 ENCOUNTER — Encounter (HOSPITAL_COMMUNITY): Payer: Self-pay | Admitting: Emergency Medicine

## 2024-01-09 ENCOUNTER — Observation Stay (HOSPITAL_COMMUNITY)
Admission: EM | Admit: 2024-01-09 | Discharge: 2024-01-13 | Disposition: A | Discharge status: Home Health | Attending: Internal Medicine | Admitting: Internal Medicine

## 2024-01-09 DIAGNOSIS — M109 Gout, unspecified: Secondary | ICD-10-CM | POA: Diagnosis not present

## 2024-01-09 DIAGNOSIS — R2689 Other abnormalities of gait and mobility: Secondary | ICD-10-CM | POA: Insufficient documentation

## 2024-01-09 DIAGNOSIS — Z79899 Other long term (current) drug therapy: Secondary | ICD-10-CM | POA: Diagnosis not present

## 2024-01-09 DIAGNOSIS — I824Y3 Acute embolism and thrombosis of unspecified deep veins of proximal lower extremity, bilateral: Secondary | ICD-10-CM | POA: Diagnosis not present

## 2024-01-09 DIAGNOSIS — I82503 Chronic embolism and thrombosis of unspecified deep veins of lower extremity, bilateral: Principal | ICD-10-CM | POA: Insufficient documentation

## 2024-01-09 DIAGNOSIS — I2510 Atherosclerotic heart disease of native coronary artery without angina pectoris: Secondary | ICD-10-CM | POA: Diagnosis not present

## 2024-01-09 DIAGNOSIS — Z87891 Personal history of nicotine dependence: Secondary | ICD-10-CM | POA: Insufficient documentation

## 2024-01-09 DIAGNOSIS — I129 Hypertensive chronic kidney disease with stage 1 through stage 4 chronic kidney disease, or unspecified chronic kidney disease: Secondary | ICD-10-CM | POA: Diagnosis not present

## 2024-01-09 DIAGNOSIS — Z85828 Personal history of other malignant neoplasm of skin: Secondary | ICD-10-CM | POA: Diagnosis not present

## 2024-01-09 DIAGNOSIS — E876 Hypokalemia: Secondary | ICD-10-CM | POA: Insufficient documentation

## 2024-01-09 DIAGNOSIS — Z7901 Long term (current) use of anticoagulants: Secondary | ICD-10-CM | POA: Diagnosis not present

## 2024-01-09 DIAGNOSIS — N184 Chronic kidney disease, stage 4 (severe): Secondary | ICD-10-CM | POA: Insufficient documentation

## 2024-01-09 DIAGNOSIS — R2240 Localized swelling, mass and lump, unspecified lower limb: Secondary | ICD-10-CM | POA: Diagnosis present

## 2024-01-09 DIAGNOSIS — E2749 Other adrenocortical insufficiency: Secondary | ICD-10-CM | POA: Diagnosis not present

## 2024-01-09 DIAGNOSIS — I82409 Acute embolism and thrombosis of unspecified deep veins of unspecified lower extremity: Secondary | ICD-10-CM | POA: Diagnosis present

## 2024-01-09 DIAGNOSIS — E785 Hyperlipidemia, unspecified: Secondary | ICD-10-CM | POA: Insufficient documentation

## 2024-01-09 DIAGNOSIS — M7989 Other specified soft tissue disorders: Principal | ICD-10-CM

## 2024-01-09 LAB — CBC WITH DIFFERENTIAL/PLATELET
Abs Immature Granulocytes: 0.17 10*3/uL — ABNORMAL HIGH (ref 0.00–0.07)
Basophils Absolute: 0 10*3/uL (ref 0.0–0.1)
Basophils Relative: 1 %
Eosinophils Absolute: 0.2 10*3/uL (ref 0.0–0.5)
Eosinophils Relative: 3 %
HCT: 35.3 % — ABNORMAL LOW (ref 36.0–46.0)
Hemoglobin: 11.3 g/dL — ABNORMAL LOW (ref 12.0–15.0)
Immature Granulocytes: 3 %
Lymphocytes Relative: 26 %
Lymphs Abs: 1.6 10*3/uL (ref 0.7–4.0)
MCH: 28.8 pg (ref 26.0–34.0)
MCHC: 32 g/dL (ref 30.0–36.0)
MCV: 89.8 fL (ref 80.0–100.0)
Monocytes Absolute: 0.8 10*3/uL (ref 0.1–1.0)
Monocytes Relative: 13 %
Neutro Abs: 3.3 10*3/uL (ref 1.7–7.7)
Neutrophils Relative %: 54 %
Platelets: 418 10*3/uL — ABNORMAL HIGH (ref 150–400)
RBC: 3.93 MIL/uL (ref 3.87–5.11)
RDW: 15.1 % (ref 11.5–15.5)
WBC: 6.1 10*3/uL (ref 4.0–10.5)
nRBC: 0 % (ref 0.0–0.2)

## 2024-01-09 LAB — BASIC METABOLIC PANEL WITH GFR
Anion gap: 16 — ABNORMAL HIGH (ref 5–15)
BUN: 34 mg/dL — ABNORMAL HIGH (ref 8–23)
CO2: 21 mmol/L — ABNORMAL LOW (ref 22–32)
Calcium: 8.7 mg/dL — ABNORMAL LOW (ref 8.9–10.3)
Chloride: 100 mmol/L (ref 98–111)
Creatinine, Ser: 2.08 mg/dL — ABNORMAL HIGH (ref 0.44–1.00)
GFR, Estimated: 23 mL/min — ABNORMAL LOW (ref >60)
Glucose, Bld: 96 mg/dL (ref 70–99)
Potassium: 4.2 mmol/L (ref 3.5–5.1)
Sodium: 137 mmol/L (ref 135–145)

## 2024-01-09 MED ORDER — ONDANSETRON HCL 4 MG PO TABS: 4.0000 mg | ORAL_TABLET | Freq: Four times a day (QID) | ORAL | Status: DC | PRN

## 2024-01-09 MED ORDER — ACETAMINOPHEN 325 MG PO TABS
650.0000 mg | ORAL_TABLET | Freq: Four times a day (QID) | ORAL | Status: DC | PRN
  Administered 2024-01-11 – 2024-01-13 (×3): 650 mg via ORAL
  Filled 2024-01-09 (×3): qty 2

## 2024-01-09 MED ORDER — ONDANSETRON HCL 4 MG/2ML IJ SOLN: 4.0000 mg | Freq: Four times a day (QID) | INTRAMUSCULAR | Status: DC | PRN

## 2024-01-09 MED ORDER — HEPARIN (PORCINE) 25000 UT/250ML-% IV SOLN
1250.0000 [IU]/h | INTRAVENOUS | Status: AC
  Administered 2024-01-10: 1250 [IU]/h via INTRAVENOUS
  Filled 2024-01-09: qty 250

## 2024-01-09 MED ORDER — ALLOPURINOL 100 MG PO TABS
100.0000 mg | ORAL_TABLET | Freq: Every day | ORAL | Status: DC
  Administered 2024-01-10 – 2024-01-13 (×4): 100 mg via ORAL
  Filled 2024-01-09 (×4): qty 1

## 2024-01-09 MED ORDER — HYDROCORTISONE 10 MG PO TABS
10.0000 mg | ORAL_TABLET | Freq: Three times a day (TID) | ORAL | Status: DC
  Administered 2024-01-10 – 2024-01-13 (×12): 10 mg via ORAL
  Filled 2024-01-09 (×13): qty 1

## 2024-01-09 MED ORDER — ACETAMINOPHEN 650 MG RE SUPP
650.0000 mg | Freq: Four times a day (QID) | RECTAL | Status: DC | PRN
Start: 2024-01-09 — End: 2024-01-11

## 2024-01-09 MED ORDER — ROSUVASTATIN CALCIUM 5 MG PO TABS
10.0000 mg | ORAL_TABLET | Freq: Every evening | ORAL | Status: DC
  Administered 2024-01-10 – 2024-01-12 (×4): 10 mg via ORAL
  Filled 2024-01-09 (×4): qty 2

## 2024-01-09 NOTE — ED Provider Notes (Signed)
 [place] [facility] AT [facility] Provider Note   CSN: [id number] Arrival date & time: 01/09/24  1533     History  Chief Complaint  Patient presents with   Leg Swelling    Adriana Jordan is a 89 y.o. female.  HPI   89 year old female presents emergency department with worse left lower extremity swelling.  Patient has known history of acute on chronic DVT and PE, supposed to be on Eliquis .  Recent admission for separate reason, she states she was discharged home and she had no leg swelling.  Is unclear if she is compliant with Eliquis .  The son that she lives with does not know either.  Patient is no longer ambulatory secondary to leg swelling and pain.  Endorses some mild shortness of breath but no acute chest pain or back pain.  Home Medications Prior to Admission medications   Medication Sig Start Date End Date Taking? Authorizing Provider  allopurinol  (ZYLOPRIM ) 100 MG tablet Take 1 tablet (100 mg total) by mouth daily. 01/05/24   Pasqual Bone, MD  apixaban  (ELIQUIS ) 5 MG TABS tablet Take 2 tablets (10 mg) by mouth twice daily-and on Thu 11/17/23-switch to 1 tablet (5 mg) by mouth twice daily 11/11/23   Ghimire, Estil Heman, MD  cetirizine  (ZYRTEC ) 10 MG tablet Take 1 tablet (10 mg total) by mouth daily. 11/11/23   Ghimire, Estil Heman, MD  diclofenac  Sodium (VOLTAREN ) 1 % GEL Apply 4 g topically as needed (shoulder pain).    [provider]  feeding supplement (ENSURE ENLIVE / ENSURE PLUS) LIQD Take 237 mLs by mouth 2 (two) times daily between meals. Patient taking differently: Take 1-2 Bottles by mouth daily as needed (skipped meal). 02/20/23   Chapman Commodore, MD  hydrocortisone  (CORTEF ) 10 MG tablet Take 1 tablet (10 mg total) by mouth 3 (three) times daily. 01/05/24   Pasqual Bone, MD  oxyCODONE  (OXY IR/ROXICODONE ) 5 MG immediate release tablet Take 5 mg by mouth 3 (three) times daily as needed. 12/27/23   [provider]  rosuvastatin  (CRESTOR )  10 MG tablet Take 1 tablet (10 mg total) by mouth every evening. 11/11/23   Ghimire, Estil Heman, MD      Allergies    Codeine    Review of Systems   Review of Systems  Constitutional:  Negative for fever.  Respiratory:  Positive for shortness of breath.   Cardiovascular:  Positive for leg swelling. Negative for chest pain.  Gastrointestinal:  Negative for abdominal pain, diarrhea and vomiting.  Skin:  Negative for rash.  Neurological:  Negative for headaches.    Physical Exam Updated Vital Signs BP 139/82   Pulse 71   Temp 98 F (36.7 C) (Oral)   Resp 16   LMP  (LMP Unknown)   SpO2 95%  Physical Exam Vitals and nursing note reviewed.  HENT:     Head: Normocephalic.     Mouth/Throat:     Mouth: Mucous membranes are moist.  Cardiovascular:     Rate and Rhythm: Normal rate.  Pulmonary:     Effort: Pulmonary effort is normal. No respiratory distress.  Abdominal:     Palpations: Abdomen is soft.     Tenderness: There is no abdominal tenderness.  Musculoskeletal:     Comments: Left lower extremity is swollen from the groin to the toes, no palpable pulse, faint dopplerable pulse at bedside, delayed cap refill of the toes, no cyanosis or other acute findings like phlegmasia  Skin:  General: Skin is warm.  Neurological:     Mental Status: She is alert. Mental status is at baseline.     ED Results / Procedures / Treatments   Labs (all labs ordered are listed, but only abnormal results are displayed) Labs Reviewed  CBC WITH DIFFERENTIAL/PLATELET  BASIC METABOLIC PANEL WITH GFR  URINALYSIS, ROUTINE W REFLEX MICROSCOPIC    EKG None  Radiology No results found.  Procedures Procedures    Medications Ordered in ED Medications - No data to display  ED Course/ Medical Decision Making/ A&P                                 Medical Decision Making Amount and/or Complexity of Data Reviewed Labs: ordered.   89 year old female presents to the emergency department  with worsening left lower extremity swelling.  History of known acute/chronic DVT as well as PE.  Questionable compliance with Eliquis .  Endorses some mild exertional shortness of breath but no active chest pain.  Vital signs are normal and stable.  On physical exam the left lower extremity is swollen from the left groin all the way down to the toes with some delayed cap refill.  No palpable pulse, dopplerable pulse at bedside.  No acute findings of phlegmasia.  Unable to get vascular ultrasound at this time of night.  Unable to do CT scan of the lower extremity due to kidney dysfunction.  Given concern of noncompliance with worsening findings in the left lower extremity now with swelling and decreased pulses will admit to hospitalist for ultrasound imaging in the morning and possible vascular evaluation.  Heparin  per pharmacy has been ordered.  Patients evaluation and results requires admission for further treatment and care.  Spoke with hospitalist, reviewed patient's ED course and they accept admission.  Patient agrees with admission plan, offers no new complaints and is stable/unchanged at time of admit.        Final Clinical Impression(s) / ED Diagnoses Final diagnoses:  None    Rx / DC Orders ED Discharge Orders     None         Flonnie Humphrey, DO 01/09/24 2315

## 2024-01-09 NOTE — ED Triage Notes (Signed)
 Patient BIB [facility] from home for L leg swelling x 4 days since leaving hospital. Patient reports she has been taking her fluid pills. Patient also reports 10/10 head and neck pain and feels cold. EMS temp 97.9, HR 86, BP 120/60, 93% RA

## 2024-01-09 NOTE — H&P (Addendum)
 History and Physical    Adriana Jordan:[id number] DOB: 30-May-1935 DOA: 01/09/2024  PCP: Pasqual Bone, MD   Chief Complaint:  leg swelling  HPI: Adriana Jordan is a 89 y.o. female with medical history significant of with history of hypertension, hyperlipidemia who presented to the ED with worsening leg swelling.  Patient was recently discharged on 5/29.  During his presentation she was treated for community-acquired pneumonia as well as bilateral lower extremity DVTs.  Is recommended that she present to nursing facility however this was refused.  Since discharge she has had progressively worsening swelling in her legs with unclear compliance to her Eliquis .  She was brought to the ER where she was found to be afebrile hemodynamically stable.  Labs were obtained which showed WBC 6.1, hemoglobin 11.3, creatinine 2.08 at baseline.  Patient had no imaging as kidney function prohibited angiogram and ultrasound was not available.  Due to unclear compliance to Eliquis  patient was placed on heparin  drip and admitted for further workup. On evaluation patient was largely noncontributory to presentation. She states that the swelling is worse in legs and has become more painful.   Review of Systems: Review of Systems  Constitutional:  Negative for chills and fever.  HENT: Negative.    Eyes: Negative.   Respiratory: Negative.    Cardiovascular:  Positive for leg swelling.  Gastrointestinal: Negative.   Genitourinary: Negative.   Musculoskeletal: Negative.   Skin: Negative.   Neurological: Negative.   Endo/Heme/Allergies: Negative.   Psychiatric/Behavioral: Negative.       As per HPI otherwise 10 point review of systems negative.   Allergies  Allergen Reactions   Codeine Nausea And Vomiting    Tolerates hydrocodone     Past Medical History:  Diagnosis Date   Acute respiratory failure with hypoxia (HCC) 11/15/2017   AKI (acute kidney injury) (HCC) 11/15/2017   Arthritis    "all over"  (11/14/2017)   Brain tumor (HCC)    "I still have it"; not cancer (11/14/2017)   Chest pain, atypical 09/11/2021   Chronic lower back pain    Community acquired pneumonia 02/14/2023   Coronary artery disease    Dr. Sharyn Deforest   Elevated CK 11/15/2017   GERD (gastroesophageal reflux disease)    History of blood transfusion    "don't remember why" (11/14/2017)   History of radiation therapy 07/03/2018   brain, pituitary/ 12.5 Gy in 1 fraction   Hypercholesteremia    Hypertension    Influenza A 11/14/2017   MI (myocardial infarction) (HCC) 2006   PONV (postoperative nausea and vomiting)    Skin cancer    "right forearm"    Past Surgical History:  Procedure Laterality Date   ANTERIOR CERVICAL DECOMP/DISCECTOMY FUSION     BACK SURGERY     CARPAL TUNNEL RELEASE Bilateral    CORONARY ANGIOPLASTY     DES mid LAD and mid RCA 09/21/04   CRANIOTOMY N/A 05/21/2015   Procedure: Transsphenoidal resection of pituitary tumor with Dr. Starling Eck for approach;  Surgeon: Claudetta Cuba, MD;  Location: [facility] NEURO ORS;  Service: Neurosurgery;  Laterality: N/A;  Transsphenoidal resection of pituitary tumor with Dr. Starling Eck for approach   SHOULDER OPEN ROTATOR CUFF REPAIR Bilateral    SKIN CANCER EXCISION Right    forearm   thumb surgery Right    placed srews to make straight   THYROID  SURGERY     "goiter removed"     reports that she quit smoking about 54 years ago. Her smoking  use included cigarettes. She started smoking about 59 years ago. She has a 0.6 pack-year smoking history. She has never used smokeless tobacco. She reports that she does not currently use alcohol. She reports that she does not use drugs.  Family History  Problem Relation Age of Onset   Cancer Father 27       throat- smoker    Breast cancer Sister 30   Cancer Brother 45       lung- smoker    Prior to Admission medications   Medication Sig Start Date End Date Taking? Authorizing Provider  allopurinol  (ZYLOPRIM ) 100 MG  tablet Take 1 tablet (100 mg total) by mouth daily. 01/05/24   Pasqual Bone, MD  apixaban  (ELIQUIS ) 5 MG TABS tablet Take 2 tablets (10 mg) by mouth twice daily-and on Thu 11/17/23-switch to 1 tablet (5 mg) by mouth twice daily 11/11/23   Ghimire, Estil Heman, MD  cetirizine  (ZYRTEC ) 10 MG tablet Take 1 tablet (10 mg total) by mouth daily. 11/11/23   Ghimire, Estil Heman, MD  diclofenac  Sodium (VOLTAREN ) 1 % GEL Apply 4 g topically as needed (shoulder pain).    [provider]  feeding supplement (ENSURE ENLIVE / ENSURE PLUS) LIQD Take 237 mLs by mouth 2 (two) times daily between meals. Patient taking differently: Take 1-2 Bottles by mouth daily as needed (skipped meal). 02/20/23   Chapman Commodore, MD  hydrocortisone  (CORTEF ) 10 MG tablet Take 1 tablet (10 mg total) by mouth 3 (three) times daily. 01/05/24   Pasqual Bone, MD  oxyCODONE  (OXY IR/ROXICODONE ) 5 MG immediate release tablet Take 5 mg by mouth 3 (three) times daily as needed. 12/27/23   [provider]  rosuvastatin  (CRESTOR ) 10 MG tablet Take 1 tablet (10 mg total) by mouth every evening. 11/11/23   Burton Casey, MD    Physical Exam: Vitals:   01/09/24 1602 01/09/24 2008  BP: 102/65 139/82  Pulse: 78 71  Resp: 18 16  Temp: 98.6 F (37 C) 98 F (36.7 C)  TempSrc: Oral Oral  SpO2: 97% 95%   Physical Exam Constitutional:      Appearance: She is normal weight.  HENT:     Head: Normocephalic.     Nose: No congestion or rhinorrhea.     Mouth/Throat:     Mouth: Mucous membranes are moist.     Pharynx: Oropharynx is clear.  Eyes:     Conjunctiva/sclera: Conjunctivae normal.     Pupils: Pupils are equal, round, and reactive to light.  Cardiovascular:     Rate and Rhythm: Normal rate and regular rhythm.  Pulmonary:     Effort: Pulmonary effort is normal.     Breath sounds: Normal breath sounds.  Abdominal:     General: Abdomen is flat. Bowel sounds are normal.  Musculoskeletal:        General: Normal range of  motion.     Cervical back: Normal range of motion.  Skin:    General: Skin is warm.     Capillary Refill: Capillary refill takes less than 2 seconds.     Findings: Erythema present.  Neurological:     General: No focal deficit present.     Mental Status: She is alert.  Psychiatric:        Mood and Affect: Mood normal.        Labs on Admission: I have personally reviewed the patients's labs and imaging studies.  Assessment/Plan Principal Problem:   DVT (deep venous thrombosis) (HCC)   #  Acute DVT - Patient presented with leg swelling found to have likely worsening of previously diagnosed bilateral DVT - Likely noncompliance with Eliquis   Plan: Placed on a heparin  drip Order US  to assess clot burden  # Hyperlipidemia-continue rosuvastatin   # Gout-continue allopurinol   #Ho adrenal insufficiency- continue hydrocortisone    Admission status: Observation Telemetry Medical  Certification: The appropriate patient status for this patient is OBSERVATION. Observation status is judged to be reasonable and necessary in order to provide the required intensity of service to ensure the patient's safety. The patient's presenting symptoms, physical exam findings, and initial radiographic and laboratory data in the context of their medical condition is felt to place them at decreased risk for further clinical deterioration. Furthermore, it is anticipated that the patient will be medically stable for discharge from the hospital within 2 midnights of admission.     Myrl Askew MD [facility] If 7PM-7AM, please contact night-coverage [email or web address]  01/09/2024, 10:59 PM

## 2024-01-09 NOTE — Progress Notes (Signed)
 PHARMACY - ANTICOAGULATION CONSULT NOTE  Pharmacy Consult for Heparin   Indication: chronic DVT  Allergies  Allergen Reactions   Codeine Nausea And Vomiting    Tolerates hydrocodone     Patient Measurements: Heparin  dosing weight: 78.87 kg   Vital Signs: Temp: 98 F (36.7 C) (06/02 2008) Temp Source: Oral (06/02 2008) BP: 139/82 (06/02 2008) Pulse Rate: 71 (06/02 2008)  Labs: Recent Labs    01/09/24 2118  HGB 11.3*  HCT 35.3*  PLT 418*  CREATININE 2.08*    Estimated Creatinine Clearance: 21.7 mL/min (A) (by C-G formula based on SCr of 2.08 mg/dL (H)).   Medical History: Past Medical History:  Diagnosis Date   Acute respiratory failure with hypoxia (HCC) 11/15/2017   AKI (acute kidney injury) (HCC) 11/15/2017   Arthritis    "all over" (11/14/2017)   Brain tumor (HCC)    "I still have it"; not cancer (11/14/2017)   Chest pain, atypical 09/11/2021   Chronic lower back pain    Community acquired pneumonia 02/14/2023   Coronary artery disease    Dr. Sharyn Deforest   Elevated CK 11/15/2017   GERD (gastroesophageal reflux disease)    History of blood transfusion    "don't remember why" (11/14/2017)   History of radiation therapy 07/03/2018   brain, pituitary/ 12.5 Gy in 1 fraction   Hypercholesteremia    Hypertension    Influenza A 11/14/2017   MI (myocardial infarction) (HCC) 2006   PONV (postoperative nausea and vomiting)    Skin cancer    "right forearm"    Assessment: 89 YOF presenting for worsening LE leg swelling. Hx of chronic DVT and PE on PTA Eliquis . Questionable noncompliance - patient is unaware of last dose. May require aPTT monitoring due to likely falsely high anti-Xa level secondary to DOAC use.   Goal of Therapy:  Heparin  level 0.3-0.7 units/ml aPTT 66-102 seconds Monitor platelets by anticoagulation protocol: Yes   Plan:  Start heparin  infusion at 1250 units/hr Check aPTT & anti-Xa level in 8 hours and daily while on heparin  Continue to monitor  via aPTT until levels are correlated Monitor daily heparin  level and CBC Continue to monitor H&H    Ayelet Gruenewald 01/09/2024,11:08 PM

## 2024-01-10 ENCOUNTER — Telehealth (HOSPITAL_COMMUNITY): Payer: Self-pay | Admitting: Pharmacy Technician

## 2024-01-10 ENCOUNTER — Encounter (HOSPITAL_COMMUNITY): Payer: Self-pay | Admitting: Internal Medicine

## 2024-01-10 ENCOUNTER — Other Ambulatory Visit (HOSPITAL_COMMUNITY): Payer: Self-pay

## 2024-01-10 ENCOUNTER — Other Ambulatory Visit: Payer: Self-pay

## 2024-01-10 ENCOUNTER — Observation Stay (HOSPITAL_COMMUNITY)

## 2024-01-10 DIAGNOSIS — I824Y3 Acute embolism and thrombosis of unspecified deep veins of proximal lower extremity, bilateral: Secondary | ICD-10-CM | POA: Diagnosis not present

## 2024-01-10 LAB — BASIC METABOLIC PANEL WITH GFR
Anion gap: 10 (ref 5–15)
BUN: 27 mg/dL — ABNORMAL HIGH (ref 8–23)
CO2: 24 mmol/L (ref 22–32)
Calcium: 8.3 mg/dL — ABNORMAL LOW (ref 8.9–10.3)
Chloride: 104 mmol/L (ref 98–111)
Creatinine, Ser: 1.86 mg/dL — ABNORMAL HIGH (ref 0.44–1.00)
GFR, Estimated: 26 mL/min — ABNORMAL LOW (ref >60)
Glucose, Bld: 87 mg/dL (ref 70–99)
Potassium: 3.3 mmol/L — ABNORMAL LOW (ref 3.5–5.1)
Sodium: 138 mmol/L (ref 135–145)

## 2024-01-10 LAB — URINALYSIS, ROUTINE W REFLEX MICROSCOPIC
Bilirubin Urine: NEGATIVE
Glucose, UA: NEGATIVE mg/dL
Hgb urine dipstick: NEGATIVE
Ketones, ur: NEGATIVE mg/dL
Leukocytes,Ua: NEGATIVE
Nitrite: NEGATIVE
Protein, ur: NEGATIVE mg/dL
Specific Gravity, Urine: 1.011 (ref 1.005–1.030)
pH: 5 (ref 5.0–8.0)

## 2024-01-10 LAB — CBC
HCT: 36.7 % (ref 36.0–46.0)
Hemoglobin: 11.9 g/dL — ABNORMAL LOW (ref 12.0–15.0)
MCH: 29.2 pg (ref 26.0–34.0)
MCHC: 32.4 g/dL (ref 30.0–36.0)
MCV: 90.2 fL (ref 80.0–100.0)
Platelets: 432 10*3/uL — ABNORMAL HIGH (ref 150–400)
RBC: 4.07 MIL/uL (ref 3.87–5.11)
RDW: 15.2 % (ref 11.5–15.5)
WBC: 6.8 10*3/uL (ref 4.0–10.5)
nRBC: 0 % (ref 0.0–0.2)

## 2024-01-10 LAB — APTT
aPTT: 71 s — ABNORMAL HIGH (ref 24–36)
aPTT: 191 s (ref 24–36)

## 2024-01-10 LAB — HEPARIN LEVEL (UNFRACTIONATED)
Heparin Unfractionated: 0.58 [IU]/mL (ref 0.30–0.70)
Heparin Unfractionated: 1.1 [IU]/mL — ABNORMAL HIGH (ref 0.30–0.70)

## 2024-01-10 MED ORDER — HEPARIN (PORCINE) 25000 UT/250ML-% IV SOLN
1000.0000 [IU]/h | INTRAVENOUS | Status: DC
  Administered 2024-01-10 (×2): 1000 [IU]/h via INTRAVENOUS
  Filled 2024-01-10: qty 250

## 2024-01-10 MED ORDER — POTASSIUM CHLORIDE CRYS ER 20 MEQ PO TBCR
40.0000 meq | EXTENDED_RELEASE_TABLET | Freq: Once | ORAL | Status: AC
  Administered 2024-01-10: 40 meq via ORAL
  Filled 2024-01-10: qty 2

## 2024-01-10 NOTE — ED Notes (Signed)
 6N Charge RN, [place], made away pt is coming to floor.

## 2024-01-10 NOTE — Progress Notes (Signed)
 PROGRESS NOTE Adriana Jordan  ZOX:[id number] DOB: Nov 17, 1934 DOA: 01/09/2024 PCP: Pasqual Bone, MD  Brief Narrative/Hospital Course:  89 y.o. female with medical history significant of with history of hypertension, hyperlipidemia who presented to the ED with worsening leg swelling.  Patient was recently discharged on 5/29.  During his presentation she was treated for community-acquired pneumonia as well as bilateral lower extremity DVTs.  Is recommended that she present to nursing facility however this was refused.  Since discharge she has had progressively worsening swelling in her legs with unclear compliance to her Eliquis .  She was brought to the ER where she was found to be afebrile hemodynamically stable.  Labs were obtained which showed WBC 6.1, hemoglobin 11.3, creatinine 2.08 at baseline.  Patient had no imaging as kidney function prohibited angiogram and ultrasound was not available.  Due to unclear compliance to Eliquis  patient was placed on heparin  drip and admitted for further workup. On evaluation patient was largely noncontributory to presentation. She states that the swelling is worse in legs and has become more painful.   Subjective: Patient seen and examined LLE swollen and painful- but no worse for few days, onset once day after going home on 5/29 Overnight afebrile hemodynamically stable Labs pending-and ordered this morning  Creatinine better at 1.2 and potassium 3.3   Assessment and plan:  Worsening of leg swelling Recent acute DVT: Presenting with leg swelling felt to be worsening of previously diagnosed bilateral DVTs with questionable compliance to Eliquis .  Transitioned to heparin  drip . Duplex ordered to assess clot burden-per duplex tech they are very busy today. Patient previously refused SNF admission I did reach out to patient's primary care physician Dr. Sharyn Deforest, who advised [facility] to continue care for now. Check ABI.  CKD stage IV: B/l creatinine around 2.   Slightly better at 1.2 monitor.  Hypokalemia: Replace potassium.  Chronic normocytic anemia: Likely from renal disease hemoglobin around 10-11 at baseline  Hyperlipidemia: continue rosuvastatin    Gout: Continue allopurinol    H/o adrenal insufficiency: Continue hydrocortisone    DVT prophylaxis: SCDs Start: 01/09/24 2257 Code Status:   Code Status: Full Code Family Communication: plan of care discussed with patient at bedside. Patient status is: Remains hospitalized because of severity of illness Level of care: Telemetry Medical   Dispo: The patient is from: home with her son. PTOT consulted.            Anticipated disposition: TBD Objective: Vitals last 24 hrs: Vitals:   01/10/24 0530 01/10/24 0800 01/10/24 1100 01/10/24 1145  BP: 126/74 117/65 123/68 129/68  Pulse: 79 74  83  Resp: 10 16 14    Temp:  97.7 F (36.5 C)  98.6 F (37 C)  TempSrc:    Oral  SpO2: 93% 93%  94%    Physical Examination: General exam: alert awake, older than stated age HEENT:Oral mucosa moist, Ear/Nose WNL grossly Respiratory system: Bilaterally clear BS, no use of accessory muscle Cardiovascular system: S1 & S2 +. Gastrointestinal system: Abdomen soft,  NT,ND,BS+ Nervous System: Alert, awake,  following commands. Extremities: LE edema more on LLE Skin: No rashes,warm. MSK: Normal muscle bulk/tone.   Data Reviewed: I have personally reviewed following labs and imaging studies ( see epic result tab) CBC: Recent Labs  Lab 01/09/24 2118 01/10/24 0712  WBC 6.1 6.8  NEUTROABS 3.3  --   HGB 11.3* 11.9*  HCT 35.3* 36.7  MCV 89.8 90.2  PLT 418* 432*   CMP: Recent Labs  Lab 01/04/24 0341 01/05/24 0459 01/09/24  2118 01/10/24 0712  NA 138 140 137 138  K 3.8 3.8 4.2 3.3*  CL 112* 110 100 104  CO2 19* 22 21* 24  GLUCOSE 139* 127* 96 87  BUN 28* 24* 34* 27*  CREATININE 1.97* 1.82* 2.08* 1.86*  CALCIUM  8.1* 8.5* 8.7* 8.3*  PHOS 2.3* 2.8  --   --    GFR: Estimated Creatinine  Clearance: 24.3 mL/min (A) (by C-G formula based on SCr of 1.86 mg/dL (H)). Recent Labs  Lab 01/04/24 0341 01/05/24 0459  ALBUMIN  2.8* 3.1*   No results for input(s): "LIPASE", "AMYLASE" in the last 168 hours. No results for input(s): "AMMONIA" in the last 168 hours. Coagulation Profile: No results for input(s): "INR", "PROTIME" in the last 168 hours. Unresulted Labs (From admission, onward)     Start     Ordered   01/11/24 0500  Heparin  level (unfractionated)  Daily,   R      01/10/24 0052   01/11/24 0500  APTT  Daily,   R      01/10/24 0052   01/10/24 1500  APTT  Once-Timed,   TIMED        01/10/24 0847           Antimicrobials/Microbiology: Anti-infectives (From admission, onward)    None         Component Value Date/Time   SDES BLOOD LEFT ARM 12/31/2023 0845   SPECREQUEST  12/31/2023 0845    BOTTLES DRAWN AEROBIC AND ANAEROBIC Blood Culture results may not be optimal due to an inadequate volume of blood received in culture bottles   CULT  12/31/2023 0845    NO GROWTH 5 DAYS Performed at [facility], [address], [place], [place] [address]    REPTSTATUS 01/05/2024 FINAL 12/31/2023 0845     Medications reviewed:  Scheduled Meds:  allopurinol   100 mg Oral Daily   hydrocortisone   10 mg Oral TID   potassium chloride   40 mEq Oral Once   rosuvastatin   10 mg Oral QPM   Continuous Infusions:  heparin  1,250 Units/hr (01/10/24 0847)    Lesa Rape, MD [facility] 01/10/2024, 12:44 PM

## 2024-01-10 NOTE — Hospital Course (Addendum)
 89 y.o. female with medical history significant of with history of hypertension, hyperlipidemia who presented to the ED with worsening leg swelling.  Patient was recently discharged on 5/29.  During his presentation she was treated for community-acquired pneumonia as well as bilateral lower extremity DVTs.  Is recommended that she present to nursing facility however this was refused.  Since discharge she has had progressively worsening swelling in her legs with unclear compliance to her Eliquis .  She was brought to the ER where she was found to be afebrile hemodynamically stable.  Labs were obtained which showed WBC 6.1, hemoglobin 11.3, creatinine 2.08 at baseline.  Patient had no imaging as kidney function prohibited angiogram and ultrasound was not available.  Due to unclear compliance to Eliquis  patient was placed on heparin  drip and admitted for further workup. On evaluation patient was largely noncontributory to presentation. She states that the swelling is worse in legs and has become more painful.   Subjective: Patient seen and examined LLE swollen and painful- but no worse for few days, onset once day after going home on 5/29 Overnight afebrile hemodynamically stable Labs pending-and ordered this morning  Creatinine better at 1.2 and potassium 3.3   Assessment and plan:  Worsening of leg swelling Recent acute DVT: Presenting with leg swelling felt to be worsening of previously diagnosed bilateral DVTs with questionable compliance to Eliquis .  Transitioned to heparin  drip . Duplex ordered to assess clot burden-per duplex tech they are very busy today. Patient previously refused SNF admission I did reach out to patient's primary care physician Dr. Sharyn Deforest, who advised [facility] to continue care for now. Check ABI.  CKD stage IV: B/l creatinine around 2.  Slightly better at 1.2 monitor.  Hypokalemia: Replace potassium.  Chronic normocytic anemia: Likely from renal disease hemoglobin around  10-11 at baseline  Hyperlipidemia: continue rosuvastatin    Gout: Continue allopurinol    H/o adrenal insufficiency: Continue hydrocortisone 

## 2024-01-10 NOTE — Progress Notes (Signed)
 Per lab, critical APTT of 191 reported at 1638. Pharmacy contacted per Lurlene Salon, MD order and advised to stop heparin  gtt.  Patient educated.

## 2024-01-10 NOTE — Evaluation (Signed)
 Occupational Therapy Evaluation Patient Details Name: Adriana Jordan MRN: [id number] DOB: 05/29/1935 Today's Date: 01/10/2024   History of Present Illness   89 y.o. female with medical history significant of with history of hypertension, hyperlipidemia who presented to the ED with worsening leg swelling.  Acute DVT, likely worsening of previously diagnosed bilateral DVT.  Initiated Heparin  6/3.     Clinical Impressions Patient admitted for the diagnosis above.  PTA she lives at home with her son, who assists with iADL and community mobility.  Recent hospitalization with scheduled [facility] rehab not initiated before she returned to [facility].  Deficits listed below.  Currently needing up to Min A for mobility and lower body ADL.  OT will follow in the acute setting to address deficits, with [facility] OT is recommended.       If plan is discharge home, recommend the following:   A lot of help with bathing/dressing/bathroom;Assistance with cooking/housework;Assist for transportation;Help with stairs or ramp for entrance;A lot of help with walking and/or transfers     Functional Status Assessment   Patient has had a recent decline in their functional status and demonstrates the ability to make significant improvements in function in a reasonable and predictable amount of time.     Equipment Recommendations   None recommended by OT     Recommendations for Other Services         Precautions/Restrictions   Precautions Precautions: Fall Restrictions Weight Bearing Restrictions Per Provider Order: No     Mobility Bed Mobility Overal bed mobility: Needs Assistance Bed Mobility: Supine to Sit, Sit to Supine     Supine to sit: Supervision Sit to supine: Min assist        Transfers Overall transfer level: Needs assistance Equipment used: Rolling walker (2 wheels) Transfers: Sit to/from Stand, Bed to chair/wheelchair/BSC Sit to Stand: Supervision     Step pivot transfers: Contact  guard assist            Balance Overall balance assessment: Needs assistance Sitting-balance support: Feet unsupported Sitting balance-Leahy Scale: Fair   Postural control: Posterior lean Standing balance support: Reliant on assistive device for balance Standing balance-Leahy Scale: Fair                             ADL either performed or assessed with clinical judgement   ADL       Grooming: Wash/dry hands;Supervision/safety;Standing   Upper Body Bathing: Set up;Sitting   Lower Body Bathing: Minimal assistance;Sit to/from stand   Upper Body Dressing : Set up;Sitting   Lower Body Dressing: Sit to/from stand;Minimal assistance   Toilet Transfer: Ambulation;Contact guard assist;Regular Toilet   Toileting- Clothing Manipulation and Hygiene: Contact guard assist;Sit to/from stand               Vision Baseline Vision/History: 1 Wears glasses Patient Visual Report: No change from baseline       Perception Perception: Not tested       Praxis Praxis: Not tested       Pertinent Vitals/Pain Pain Assessment Faces Pain Scale: Hurts even more Pain Location: L leg Pain Descriptors / Indicators: Discomfort, Guarding Pain Intervention(s): Monitored during session     Extremity/Trunk Assessment Upper Extremity Assessment Upper Extremity Assessment: Overall WFL for tasks assessed   Lower Extremity Assessment Lower Extremity Assessment: Defer to PT evaluation   Cervical / Trunk Assessment Cervical / Trunk Assessment: Normal   Communication Communication Communication: Impaired Factors Affecting Communication: Hearing  impaired   Cognition Arousal: Alert Behavior During Therapy: WFL for tasks assessed/performed Cognition: No apparent impairments             OT - Cognition Comments: decreased awareness of deficits                 Following commands: Intact       Cueing  General Comments   Cueing Techniques: Verbal cues;Tactile  cues   VSS on RA   Exercises     Shoulder Instructions      Home Living Family/patient expects to be discharged to:: Private residence Living Arrangements: Children Available Help at Discharge: Family;Available 24 hours/day Type of Home: House Home Access: Stairs to enter [employer] of Steps: 3 Entrance Stairs-Rails: Right;Left Home Layout: One level     Bathroom Shower/Tub: Producer, television/film/video: Standard Bathroom Accessibility: Yes How Accessible: Accessible via walker Home Equipment: Rolling Walker (2 wheels);Cane - single point;BSC/3in1;Shower seat   Additional Comments: Pt lives with son who is available 24/7      Prior Functioning/Environment Prior Level of Function : Needs assist             Mobility Comments: uses a cane in the house and a cane with a spike on the end when outside ADLs Comments: Pt states independent in self care, but son does most cooking/cleaning.    OT Problem List: Decreased strength;Impaired balance (sitting and/or standing);Decreased knowledge of use of DME or AE;Pain   OT Treatment/Interventions: Self-care/ADL training;DME and/or AE instruction;Therapeutic activities;Patient/family education;Balance training      OT Goals(Current goals can be found in the care plan section)   Acute Rehab OT Goals Patient Stated Goal: Return hoem OT Goal Formulation: With patient Time For Goal Achievement: 01/24/24 Potential to Achieve Goals: Good   OT Frequency:  Min 2X/week    Co-evaluation              AM-PAC OT "6 Clicks" Daily Activity     Outcome Measure Help from another person eating meals?: None Help from another person taking care of personal grooming?: A Little Help from another person toileting, which includes using toliet, bedpan, or urinal?: A Little Help from another person bathing (including washing, rinsing, drying)?: A Little Help from another person to put on and taking off regular upper  body clothing?: None Help from another person to put on and taking off regular lower body clothing?: A Little 6 Click Score: 20   End of Session Equipment Utilized During Treatment: Rolling walker (2 wheels);Gait belt Nurse Communication: Mobility status  Activity Tolerance: Patient limited by pain Patient left: in bed;with call bell/phone within reach;with nursing/sitter in room  OT Visit Diagnosis: Unsteadiness on feet (R26.81);Other abnormalities of gait and mobility (R26.89);Pain;Muscle weakness (generalized) (M62.81) Pain - Right/Left: Left Pain - part of body: Leg                Time: 0454-0981 OT Time Calculation (min): 25 min Charges:  OT General Charges $OT Visit: 1 Visit OT Evaluation $OT Eval Moderate Complexity: 1 Mod OT Treatments $Self Care/Home Management : 8-22 mins  01/10/2024  RP, OTR/L  Acute Rehabilitation Services  Office:  [phone]   Benjamen Brand 01/10/2024, 12:36 PM

## 2024-01-10 NOTE — Progress Notes (Signed)
 PHARMACY - ANTICOAGULATION CONSULT NOTE  Pharmacy Consult for Heparin   Indication: chronic DVT  Allergies  Allergen Reactions   Codeine Nausea And Vomiting    Tolerates hydrocodone     Patient Measurements: Heparin  dosing weight: 78.87 kg   Vital Signs: Temp: 97.7 F (36.5 C) (06/03 1354) Temp Source: Oral (06/03 1354) BP: 117/57 (06/03 1354) Pulse Rate: 83 (06/03 1354)  Labs: Recent Labs    01/09/24 2118 01/10/24 0014 01/10/24 0712 01/10/24 1451  HGB 11.3*  --  11.9*  --   HCT 35.3*  --  36.7  --   PLT 418*  --  432*  --   APTT  --   --  71* 191*  HEPARINUNFRC  --  0.58 1.10*  --   CREATININE 2.08*  --  1.86*  --     Estimated Creatinine Clearance: 24.3 mL/min (A) (by C-G formula based on SCr of 1.86 mg/dL (H)).   Assessment: 73 YOF presenting for worsening LE leg swelling. Hx of chronic DVT and PE on PTA Eliquis  (LD 6/2 0900). Noted questionable medication adherence. Will require aPTT monitoring until due to likely falsely high anti-Xa level secondary to DOAC use. Pharmacy consulted for heparin  dosing.  Heparin  running at 1250 units/hr with no issues noted with infusion or bleeding. aPTT elevated above goal at 191. Per conversation with RN, heparin  running on L arm, lab drawn from right arm.   Goal of Therapy:  Heparin  level 0.3-0.7 units/ml aPTT 66-102 seconds Monitor platelets by anticoagulation protocol: Yes   Plan:   Hold heparin  for 1 hour Restart heparin  infusion at 1000 units/hr Check heparin  level in 8 hours with AM labs and daily while on heparin  Continue to monitor H&H and platelets  Thank you for allowing pharmacy to be a part of this patient's care.  Claudia Cuff, PharmD, BCPS Clinical Pharmacist

## 2024-01-10 NOTE — Telephone Encounter (Signed)
 Patient Product/process development scientist completed.    The patient is insured through [employer]. Patient has Medicare and is not eligible for a copay card, but may be able to apply for patient assistance or Medicare RX Payment Plan (Patient Must reach out to their plan, if eligible for payment plan), if available.    Ran test claim for Eliquis 5 mg and the current 30 day co-pay is $12.15.  Ran test claim for Xarelto 20 mg and the current 30 day co-pay is $12.15.  This test claim was processed through [facility]- copay amounts may vary at other pharmacies due to pharmacy/plan contracts, or as the patient moves through the different stages of their insurance plan.     Morgan Arab, CPHT Pharmacy Technician III Certified Patient Advocate [facility] Patient Advocate Team Direct Number: [phone]  Fax: [phone]

## 2024-01-10 NOTE — Evaluation (Addendum)
 Physical Therapy Evaluation Patient Details Name: Adriana Jordan MRN: [id number] DOB: Jan 26, 1935 Today's Date: 01/10/2024  History of Present Illness  89 y.o. female with medical history significant of with history of hypertension, hyperlipidemia who presented to the ED with worsening leg swelling.  Acute DVT, likely worsening of previously diagnosed bilateral DVT.  Initiated Heparin  6/3. PMHx: HTN, hyperlipedemia  Clinical Impression  Pt in bed upon arrival and agreeable to PT eval. PTA, pt was ModI with SP cane. In today's session, pt was able to stand and ambulate in the room with CGA and RW. Pt has 24/7 physical assist available from son upon return home. Due to recent re-admission, pt was not able to begin home health services. Recommending post-acute HHPT to work towards independence with mobility. Pt would benefit from acute skilled PT with current functional limitations listed below (see PT Problem List). Acute PT to follow.         If plan is discharge home, recommend the following: A little help with walking and/or transfers;A little help with bathing/dressing/bathroom;Assist for transportation;Assistance with cooking/housework   Can travel by private vehicle    Yes    Equipment Recommendations None recommended by PT     Functional Status Assessment Patient has had a recent decline in their functional status and demonstrates the ability to make significant improvements in function in a reasonable and predictable amount of time.     Precautions / Restrictions Precautions Precautions: Fall Restrictions Weight Bearing Restrictions Per Provider Order: No      Mobility  Bed Mobility Overal bed mobility: Needs Assistance Bed Mobility: Supine to Sit, Sit to Supine    Supine to sit: Supervision, HOB elevated Sit to supine: HOB elevated, Supervision   General bed mobility comments: supervision for safety, no physical assist    Transfers Overall transfer level: Needs  assistance Equipment used: Rolling walker (2 wheels) Transfers: Sit to/from Stand Sit to Stand: Contact guard assist    General transfer comment: CGA for safety, cues for hand placement with RW    Ambulation/Gait Ambulation/Gait assistance: Contact guard assist Gait Distance (Feet): 40 Feet Assistive device: Rolling walker (2 wheels) Gait Pattern/deviations: Step-through pattern, Decreased stride length Gait velocity: decr     General Gait Details: steady with no overt LOB    Balance Overall balance assessment: Needs assistance Sitting-balance support: Feet unsupported Sitting balance-Leahy Scale: Fair     Standing balance support: No upper extremity supported Standing balance-Leahy Scale: Fair Standing balance comment: able to stand statically at sink, RW for gait        Pertinent Vitals/Pain Pain Assessment Pain Assessment: Faces Faces Pain Scale: Hurts even more Pain Location: L leg and R knee (chronic) Pain Descriptors / Indicators: Discomfort, Guarding Pain Intervention(s): Limited activity within patient's tolerance, Monitored during session, Repositioned    Home Living Family/patient expects to be discharged to:: Private residence Living Arrangements: Children Available Help at Discharge: Family;Available 24 hours/day Type of Home: House Home Access: Stairs to enter Entrance Stairs-Rails: Doctor, general practice of Steps: 3   Home Layout: One level Home Equipment: Agricultural consultant (2 wheels);Cane - single point;BSC/3in1;Shower seat Additional Comments: Pt lives with son who is available 24/7    Prior Function Prior Level of Function : Needs assist  + Mobility Comments: ModI with cane in the home and cane with spike on end when outside ADLs Comments: Pt states independent in self care, but son does most cooking/cleaning.     Extremity/Trunk Assessment   Upper Extremity Assessment Upper Extremity  Assessment: Defer to OT evaluation    Lower  Extremity Assessment Lower Extremity Assessment: Overall WFL for tasks assessed (L LE edema, chronic R knee pain. Strength WFL per MMT)    Cervical / Trunk Assessment Cervical / Trunk Assessment: Normal  Communication   Communication Communication: Impaired Factors Affecting Communication: Hearing impaired    Cognition Arousal: Alert Behavior During Therapy: WFL for tasks assessed/performed   PT - Cognitive impairments: No apparent impairments    Following commands: Intact       Cueing Cueing Techniques: Verbal cues, Tactile cues      PT Assessment Patient needs continued PT services  PT Problem List Decreased strength;Decreased activity tolerance;Decreased balance;Decreased mobility       PT Treatment Interventions DME instruction;Gait training;Functional mobility training;Therapeutic activities;Therapeutic exercise;Balance training;Patient/family education    PT Goals (Current goals can be found in the Care Plan section)  Acute Rehab PT Goals Patient Stated Goal: to go home PT Goal Formulation: With patient Time For Goal Achievement: 01/24/24 Potential to Achieve Goals: Good    Frequency Min 2X/week        AM-PAC PT "6 Clicks" Mobility  Outcome Measure Help needed turning from your back to your side while in a flat bed without using bedrails?: A Little Help needed moving from lying on your back to sitting on the side of a flat bed without using bedrails?: A Little Help needed moving to and from a bed to a chair (including a wheelchair)?: A Little Help needed standing up from a chair using your arms (e.g., wheelchair or bedside chair)?: A Little Help needed to walk in hospital room?: A Little Help needed climbing 3-5 steps with a railing? : A Little 6 Click Score: 18    End of Session Equipment Utilized During Treatment: Gait belt Activity Tolerance: Patient tolerated treatment well Patient left: in bed;with call bell/phone within reach Nurse Communication:  Mobility status PT Visit Diagnosis: Unsteadiness on feet (R26.81);Other abnormalities of gait and mobility (R26.89);Muscle weakness (generalized) (M62.81)    Time: 8295-6213 PT Time Calculation (min) (ACUTE ONLY): 20 min   Charges:   PT Evaluation $PT Eval Low Complexity: 1 Low   PT General Charges $$ ACUTE PT VISIT: 1 Visit    Orysia Blas, PT, DPT Secure Chat Preferred  Rehab Office [phone]   Alissa April Adela Ades 01/10/2024, 5:06 PM

## 2024-01-10 NOTE — Progress Notes (Signed)
 PHARMACY - ANTICOAGULATION CONSULT NOTE  Pharmacy Consult for Heparin   Indication: chronic DVT  Allergies  Allergen Reactions   Codeine Nausea And Vomiting    Tolerates hydrocodone     Patient Measurements: Heparin  dosing weight: 78.87 kg   Vital Signs: Temp: 97.1 F (36.2 C) (06/03 0432) Temp Source: Temporal (06/03 0432) BP: 126/74 (06/03 0530) Pulse Rate: 79 (06/03 0530)  Labs: Recent Labs    01/09/24 2118 01/10/24 0014 01/10/24 0712  HGB 11.3*  --  11.9*  HCT 35.3*  --  36.7  PLT 418*  --  432*  HEPARINUNFRC  --  0.58 1.10*  CREATININE 2.08*  --   --     Estimated Creatinine Clearance: 21.7 mL/min (A) (by C-G formula based on SCr of 2.08 mg/dL (H)).   Medical History: Past Medical History:  Diagnosis Date   Acute respiratory failure with hypoxia (HCC) 11/15/2017   AKI (acute kidney injury) (HCC) 11/15/2017   Arthritis    "all over" (11/14/2017)   Brain tumor (HCC)    "I still have it"; not cancer (11/14/2017)   Chest pain, atypical 09/11/2021   Chronic lower back pain    Community acquired pneumonia 02/14/2023   Coronary artery disease    Dr. Sharyn Deforest   Elevated CK 11/15/2017   GERD (gastroesophageal reflux disease)    History of blood transfusion    "don't remember why" (11/14/2017)   History of radiation therapy 07/03/2018   brain, pituitary/ 12.5 Gy in 1 fraction   Hypercholesteremia    Hypertension    Influenza A 11/14/2017   MI (myocardial infarction) (HCC) 2006   PONV (postoperative nausea and vomiting)    Skin cancer    "right forearm"    Assessment: 89 YOF presenting for worsening LE leg swelling. Hx of chronic DVT and PE on PTA Eliquis  (LD 6/2 0900). Noted questionable medication adherence. Will require aPTT monitoring until due to likely falsely high anti-Xa level secondary to DOAC use. Pharmacy consulted for heparin  dosing.  Heparin  level 1.10, elevated as expected with doac prior to admission aPTT 71, therapeutic  Heparin  currently  running at 1250 units/hr with no issues noted with infusion or bleeding.  Goal of Therapy:  Heparin  level 0.3-0.7 units/ml aPTT 66-102 seconds Monitor platelets by anticoagulation protocol: Yes   Plan:  Continue heparin  infusion at 1250 units/hr 8h aPTT  Daily aPTT, heparin  level, CBC, and monitoring for bleeding F/u plans for anticoagulation   Thank you for allowing pharmacy to participate in this patient's care.  Fonda Hymen, PharmD Emergency Medicine Clinical Pharmacist 01/10/2024,8:00 AM

## 2024-01-11 DIAGNOSIS — I824Y3 Acute embolism and thrombosis of unspecified deep veins of proximal lower extremity, bilateral: Secondary | ICD-10-CM | POA: Diagnosis not present

## 2024-01-11 LAB — BASIC METABOLIC PANEL WITH GFR
Anion gap: 10 (ref 5–15)
BUN: 28 mg/dL — ABNORMAL HIGH (ref 8–23)
CO2: 20 mmol/L — ABNORMAL LOW (ref 22–32)
Calcium: 8.5 mg/dL — ABNORMAL LOW (ref 8.9–10.3)
Chloride: 106 mmol/L (ref 98–111)
Creatinine, Ser: 1.73 mg/dL — ABNORMAL HIGH (ref 0.44–1.00)
GFR, Estimated: 28 mL/min — ABNORMAL LOW (ref >60)
Glucose, Bld: 101 mg/dL — ABNORMAL HIGH (ref 70–99)
Potassium: 3.9 mmol/L (ref 3.5–5.1)
Sodium: 136 mmol/L (ref 135–145)

## 2024-01-11 MED ORDER — APIXABAN 5 MG PO TABS
5.0000 mg | ORAL_TABLET | Freq: Two times a day (BID) | ORAL | Status: DC
  Administered 2024-01-11 – 2024-01-13 (×5): 5 mg via ORAL
  Filled 2024-01-11: qty 1
  Filled 2024-01-11: qty 2
  Filled 2024-01-11 (×3): qty 1

## 2024-01-11 NOTE — Progress Notes (Signed)
 Adriana Jordan  ZOX:[id number] DOB: March 19, 1935 DOA: 01/09/2024 PCP: Pasqual Bone, MD    Brief Narrative:  89 year old with a history of chronic adrenal insufficiency status post pituitary adenoma resection with radiation treatment, CKD stage IIIb, HTN, HLD, CAD, and known bilateral lower extremity DVTs diagnosed during a recent hospitalization May 2025 who presented to the ER 6/2 with worsening of his leg swelling.  It was unclear if the patient has been compliant with her Eliquis .  Goals of Care:   Code Status: Full Code   DVT prophylaxis: SCDs Start: 01/09/24 2257   Interim Hx: Afebrile.  Vital signs stable.  Oxygen saturation 98% on room air.  Patient reports no significant change in her bilateral lower extremity swelling.  She denies chest pain or shortness of breath.  She tells me she often gets confused at home because she is also on many medications and admits that it is likely she has missed doses of her Eliquis .  Assessment & Plan:  Extensive chronic bilateral lower extremity DVTs w/ PE with acute symptomatic worsening Dating back to April 2025 per review of records - repeat venous duplex 01/03/2024 suggested new clot progression - there is suspicion she is not consistently compliant with her Eliquis  -I have counseled her on the absolute need for very strict compliance with Eliquis  dosing -repeat duplex pending  CKD stage IV Baseline creatinine approximately 2.0 - creatinine stable  Hypokalemia Supplemented  Anemia of CKD Monitor trend with ongoing use of anticoagulation  HLD Continue usual rosuvastatin  dose  Gout Continue allopurinol   Chronic adrenal insufficiency status post pituitary adenoma resection Continue usual hydrocortisone  dose    Family Communication: No family present at time of exam Disposition: Anticipate eventual discharge home   Objective: Blood pressure 121/66, pulse 62, temperature (!) 97.5 F (36.4 C), temperature source Oral, resp. rate  16, height 5\' 6"  (1.676 m), weight 91.4 kg, SpO2 98%.  Intake/Output Summary (Last 24 hours) at 01/11/2024 0859 Last data filed at 01/11/2024 0301 Gross per 24 hour  Intake 327.32 ml  Output --  Net 327.32 ml   Filed Weights   01/10/24 1354  Weight: 91.4 kg    Examination: General: No acute respiratory distress Lungs: Clear to auscultation bilaterally without wheezes or crackles Cardiovascular: Regular rate and rhythm without murmur gallop or rub normal S1 and S2 Abdomen: Nontender, nondistended, soft, bowel sounds positive, no rebound, no ascites, no appreciable mass Extremities: 2+ bilateral lower extremity edema to upper thighs with no erythema or cutaneous change  CBC: Recent Labs  Lab 01/09/24 2118 01/10/24 0712  WBC 6.1 6.8  NEUTROABS 3.3  --   HGB 11.3* 11.9*  HCT 35.3* 36.7  MCV 89.8 90.2  PLT 418* 432*   Basic Metabolic Panel: Recent Labs  Lab 01/05/24 0459 01/09/24 2118 01/10/24 0712 01/11/24 0631  NA 140 137 138 136  K 3.8 4.2 3.3* 3.9  CL 110 100 104 106  CO2 22 21* 24 20*  GLUCOSE 127* 96 87 101*  BUN 24* 34* 27* 28*  CREATININE 1.82* 2.08* 1.86* 1.73*  CALCIUM  8.5* 8.7* 8.3* 8.5*  PHOS 2.8  --   --   --    GFR: Estimated Creatinine Clearance: 26.1 mL/min (A) (by C-G formula based on SCr of 1.73 mg/dL (H)).   Scheduled Meds:  allopurinol   100 mg Oral Daily   hydrocortisone   10 mg Oral TID   rosuvastatin   10 mg Oral QPM   Continuous Infusions:  heparin  1,000 Units/hr (01/11/24 0336)  LOS: 0 days   Abbe Abate, MD [facility] Office  [phone] Pager - Text Page per Amion  If 7PM-7AM, please contact night-coverage per Amion 01/11/2024, 8:59 AM

## 2024-01-11 NOTE — Progress Notes (Signed)
 Notified McClung, MD that lab was only able to collect enough blood for CMP.  CBC, heparin , and aPTT labs were not completed.  Dr. Jonelle Neri acknowledged.

## 2024-01-11 NOTE — Plan of Care (Signed)
  Problem: Education: Goal: Knowledge of General Education information will improve Description: Including pain rating scale, medication(s)/side effects and non-pharmacologic comfort measures Outcome: Progressing   Problem: Clinical Measurements: Goal: Will remain free from infection Outcome: Progressing   Problem: Activity: Goal: Risk for activity intolerance will decrease Outcome: Progressing   Problem: Coping: Goal: Level of anxiety will decrease Outcome: Progressing   Problem: Elimination: Goal: Will not experience complications related to bowel motility Outcome: Progressing   

## 2024-01-11 NOTE — Progress Notes (Signed)
 Mobility Specialist Progress Note:    01/11/24 1514  Mobility  Activity Ambulated with assistance in hallway  Level of Assistance Contact guard assist, steadying assist  Assistive Device Front wheel walker  Distance Ambulated (ft) 200 ft  Activity Response Tolerated well  Mobility Referral Yes  Mobility visit 1 Mobility  Mobility Specialist Start Time (ACUTE ONLY) 1439  Mobility Specialist Stop Time (ACUTE ONLY) 1451  Mobility Specialist Time Calculation (min) (ACUTE ONLY) 12 min   Pt received in bed, hesitant but agreeable to mobility. Required MinA for bed mobility and contact guard during ambulation. During ambulation, pt c/o LLE pain, otherwise no c/o. Returned to room w/o fault. Left in bed w/ call bell and personal belongings in reach. All needs met. Bed alarm on.  Inetta Manes Mobility Specialist  Please contact vis Secure Chat or  Rehab Office [phone]

## 2024-01-11 NOTE — Progress Notes (Addendum)
 PHARMACY - ANTICOAGULATION CONSULT NOTE  Pharmacy Consult for Heparin  >> apixaban  Indication: chronic DVT  Allergies  Allergen Reactions   Codeine Nausea And Vomiting    Tolerates hydrocodone     Patient Measurements: Heparin  dosing weight: 78.87 kg   Vital Signs: Temp: 97.5 F (36.4 C) (06/04 0744) Temp Source: Oral (06/04 0744) BP: 121/66 (06/04 0744) Pulse Rate: 62 (06/04 0744)  Labs: Recent Labs    01/09/24 2118 01/10/24 0014 01/10/24 0712 01/10/24 1451 01/11/24 0631  HGB 11.3*  --  11.9*  --   --   HCT 35.3*  --  36.7  --   --   PLT 418*  --  432*  --   --   APTT  --   --  71* 191*  --   HEPARINUNFRC  --  0.58 1.10*  --   --   CREATININE 2.08*  --  1.86*  --  1.73*    Estimated Creatinine Clearance: 26.1 mL/min (A) (by C-G formula based on SCr of 1.73 mg/dL (H)).   Assessment: 21 YOF presenting for worsening LE leg swelling. Hx of BL LE DVT and PE 11/09/23 on PTA Eliquis  (LD 6/2 0900). Noted questionable medication adherence. New LLE DVT found 5/27.  Patient has been on heparin  6/3 and pharmacy consulted to transition back to apixaban .   Goal of Therapy:  Heparin  level 0.3-0.7 units/ml aPTT 66-102 seconds Monitor platelets by anticoagulation protocol: Yes   Plan:  Stop heparin  Restart apixaban  5mg  BID  Monitor signs/symptoms of bleeding   Thank you for allowing pharmacy to be a part of this patient's care.  Dorene Gang, PharmD, BCPS, BCCP Clinical Pharmacist  Please check AMION for all [facility] Pharmacy phone numbers After 10:00 PM, call Main Pharmacy [phone]

## 2024-01-11 NOTE — TOC Initial Note (Signed)
 Transition of Care (TOC) - Initial/Assessment Note   Spoke to patient at bedside. Patient from home with son who can provide assistance.   Patient was active with [facility] and HHPT , Cory with [facility] can add HHOT. Patient wants to continue services.  NCM entered orders and face to face for MD to sign.   Patient has cane, walker and 3 in 1 at home.  Patient Details  Name: Adriana Jordan MRN: [id number] Date of Birth: [birth year]  Transition of Care [facility]) CM/SW Contact:    Terre Ferri, RN Phone Number: 01/11/2024, 10:56 AM  Clinical Narrative:                   Expected Discharge Plan: Home w Home Health Services Barriers to Discharge: Continued Medical Work up   Patient Goals and CMS Choice Patient states their goals for this hospitalization and ongoing recovery are:: to return to home CMS Medicare.gov Compare Post Acute Care list provided to:: Patient Choice offered to / list presented to : Patient      Expected Discharge Plan and Services In-house Referral: NA Discharge Planning Services: CM Consult Post Acute Care Choice: Home Health Living arrangements for the past 2 months: Single Family Home                 DME Arranged: N/A DME Agency: NA       HH Arranged: RN, PT, OT HH Agency: [facility] Date [facility] Agency Contacted: 01/11/24 Time HH Agency Contacted: 1055 Representative spoke with at [facility] Agency: Randel Buss  Prior Living Arrangements/Services Living arrangements for the past 2 months: Single Family Home Lives with:: Adult Children Patient language and need for interpreter reviewed:: Yes Do you feel safe going back to the place where you live?: Yes      Need for Family Participation in Patient Care: Yes (Comment) Care giver support system in place?: Yes (comment) Current home services: DME Criminal Activity/Legal Involvement Pertinent to Current Situation/Hospitalization: No - Comment as needed  Activities of Daily Living   ADL  Screening (condition at time of admission) Independently performs ADLs?: Yes (appropriate for developmental age) Is the patient deaf or have difficulty hearing?: No Does the patient have difficulty seeing, even when wearing glasses/contacts?: No Does the patient have difficulty concentrating, remembering, or making decisions?: No  Permission Sought/Granted   Permission granted to share information with : Yes, Verbal Permission Granted     Permission granted to share info w AGENCY: [facility]        Emotional Assessment Appearance:: Appears stated age Attitude/Demeanor/Rapport: Engaged Affect (typically observed): Appropriate Orientation: : Oriented to Self, Oriented to Place, Oriented to  Time, Oriented to Situation Alcohol / Substance Use: Not Applicable Psych Involvement: No (comment)  Admission diagnosis:  Leg swelling [M79.89] DVT (deep venous thrombosis) (HCC) [I82.409] Patient Active Problem List   Diagnosis Date Noted   DVT (deep venous thrombosis) (HCC) 01/09/2024   Community acquired pneumonia 12/31/2023   Hypothermia 11/09/2023   Acute hypoxemic respiratory failure (HCC) 11/08/2023   Pneumonia 09/27/2023   CAD S/P percutaneous coronary angioplasty 09/12/2023   Hypotension 09/12/2023   Near syncope 09/12/2023   Syncope 07/30/2023   Headache 06/21/2022   Adrenal insufficiency (HCC) 03/05/2022   Protein-calorie malnutrition, severe 03/03/2022   Hypokalemia 03/01/2022   Generalized weakness 03/01/2022   Protein-calorie malnutrition, moderate (HCC) 03/01/2022   Multiple falls 03/01/2022   Tick bite 03/01/2022   Hyponatremia 02/21/2022   History of CVA (cerebrovascular accident) 10/11/2019  Leukopenia 11/15/2017   AKI (acute kidney injury) (HCC) 11/15/2017   Hypertension 11/14/2017   Hyperlipidemia 11/14/2017   GERD (gastroesophageal reflux disease) 11/14/2017   Pituitary adenoma (HCC) 04/07/2015   PCP:  Pasqual Bone, MD Pharmacy:   [facility] [place]  [place] - [address] [place] [address] Phone: [phone] Fax: [phone]  [facility] [address] [place] [address] Phone: [phone] Fax: [phone]     Social Drivers of Health (SDOH) Social History: SDOH Screenings   Food Insecurity: No Food Insecurity (01/10/2024)  Housing: Low Risk  (01/10/2024)  Transportation Needs: No Transportation Needs (01/10/2024)  Utilities: Not At Risk (01/10/2024)  Social Connections: Moderately Integrated (01/10/2024)  Tobacco Use: Medium Risk (01/10/2024)   SDOH Interventions:     Readmission Risk Interventions    02/15/2023    1:22 PM  Readmission Risk Prevention Plan  Post Dischage Appt Complete  Medication Screening Complete  Transportation Screening Complete

## 2024-01-11 NOTE — Care Management Obs Status (Signed)
 MEDICARE OBSERVATION STATUS NOTIFICATION   Patient Details  Name: Adriana Jordan MRN: [id number] Date of Birth: 30-Jan-1935   Medicare Observation Status Notification Given:  Yes    Felix Host 01/11/2024, 10:33 AM

## 2024-01-12 ENCOUNTER — Observation Stay (HOSPITAL_BASED_OUTPATIENT_CLINIC_OR_DEPARTMENT_OTHER)

## 2024-01-12 DIAGNOSIS — M7989 Other specified soft tissue disorders: Secondary | ICD-10-CM

## 2024-01-12 DIAGNOSIS — I824Y3 Acute embolism and thrombosis of unspecified deep veins of proximal lower extremity, bilateral: Secondary | ICD-10-CM | POA: Diagnosis not present

## 2024-01-12 NOTE — Progress Notes (Signed)
 Adriana Jordan  ZOX:[id number] DOB: 07-24-1935 DOA: 01/09/2024 PCP: Pasqual Bone, MD    Brief Narrative:  89 year old with a history of chronic adrenal insufficiency status post pituitary adenoma resection with radiation treatment, CKD stage IIIb, HTN, HLD, CAD, and known bilateral lower extremity DVTs diagnosed during a hospitalization in May 2025 who presented to the ER 6/2 with worsening of her leg swelling.  Hx revealed the patient  had not been consistently complaint with her eliquis  dosing.  Goals of Care:   Code Status: Full Code   DVT prophylaxis: SCDs Start: 01/09/24 2257 apixaban  (ELIQUIS ) tablet 5 mg   Interim Hx: No acute events recorded overnight.  Afebrile.  Vital signs stable.  Reports no new complaints today.  Swelling in legs has not significantly changed.  Denies chest pain shortness of breath.  Is alert and oriented.  Assessment & Plan:  Extensive chronic bilateral lower extremity DVTs w/ PE with acute symptomatic worsening Dating back to April 2025 per review of records - repeat venous duplex 01/03/2024 suggested new clot progression - she is not consistently compliant with her Eliquis  -I have counseled her on the absolute need for very strict compliance with Eliquis  dosing - repeat duplex still pending  CKD stage IV Baseline creatinine approximately 2.0 - creatinine stable/better than baseline at present  Hypokalemia Supplemented to normal level  Anemia of CKD Monitoring trend with ongoing use of anticoagulation -stable at this time  HLD Continue usual rosuvastatin  dose  Gout Continue allopurinol   Chronic adrenal insufficiency status post pituitary adenoma resection Continue usual hydrocortisone  dose    Family Communication: No family present at time of exam Disposition: Anticipate eventual discharge home -if venous duplex without evidence of complication will provide with compression stockings and plan for discharge home with instructions for strict  compliance with Eliquis    Objective: Blood pressure (!) 114/58, pulse 71, temperature 98 F (36.7 C), temperature source Oral, resp. rate 18, height 5\' 6"  (1.676 m), weight 91.4 kg, SpO2 97%.  Intake/Output Summary (Last 24 hours) at 01/12/2024 0906 Last data filed at 01/12/2024 0800 Gross per 24 hour  Intake 834 ml  Output --  Net 834 ml   Filed Weights   01/10/24 1354  Weight: 91.4 kg    Examination: General: No acute respiratory distress Lungs: Clear to auscultation bilaterally without wheezes or crackles Cardiovascular: Regular rate and rhythm without murmur gallop or rub normal S1 and S2 Abdomen: Nontender, nondistended, soft, bowel sounds positive, no rebound, no ascites, no appreciable mass Extremities: 2+ bilateral lower extremity edema to upper thighs with no erythema or cutaneous change  CBC: Recent Labs  Lab 01/09/24 2118 01/10/24 0712  WBC 6.1 6.8  NEUTROABS 3.3  --   HGB 11.3* 11.9*  HCT 35.3* 36.7  MCV 89.8 90.2  PLT 418* 432*   Basic Metabolic Panel: Recent Labs  Lab 01/09/24 2118 01/10/24 0712 01/11/24 0631  NA 137 138 136  K 4.2 3.3* 3.9  CL 100 104 106  CO2 21* 24 20*  GLUCOSE 96 87 101*  BUN 34* 27* 28*  CREATININE 2.08* 1.86* 1.73*  CALCIUM  8.7* 8.3* 8.5*   GFR: Estimated Creatinine Clearance: 26.1 mL/min (A) (by C-G formula based on SCr of 1.73 mg/dL (H)).   Scheduled Meds:  allopurinol   100 mg Oral Daily   apixaban   5 mg Oral BID   hydrocortisone   10 mg Oral TID   rosuvastatin   10 mg Oral QPM     LOS: 0 days   Susana Enter T.  Jonelle Neri, MD [facility] Office  [phone] Pager - Text Page per Amion  If 7PM-7AM, please contact night-coverage per Amion 01/12/2024, 9:06 AM

## 2024-01-12 NOTE — Progress Notes (Signed)
 Occupational Therapy Treatment Patient Details Name: Adriana Jordan MRN: [id number] DOB: 01/07/1935 Today's Date: 01/12/2024   History of present illness 89 y.o. female with medical history significant of with history of hypertension, hyperlipidemia who presented to the ED with worsening leg swelling.  Acute DVT, likely worsening of previously diagnosed bilateral DVT.  Initiated Heparin  6/3. PMHx: HTN, hyperlipedemia   OT comments  Patient received in supine and agreeable to OT treatment. Patient able to get to EOB with supervision and required assistance with socks. Patient declined grooming or bathing tasks and performed transfer training to simulate toilet and functional transfers with CGA. Patient asking to return to supine at end of session, with patient requiring CGA due to assistance needed with LLE. Discharge recommendations continue to be appropriate. Acute OT to continue to follow.       If plan is discharge home, recommend the following:  A lot of help with bathing/dressing/bathroom;Assistance with cooking/housework;Assist for transportation;Help with stairs or ramp for entrance;A lot of help with walking and/or transfers   Equipment Recommendations  None recommended by OT    Recommendations for Other Services      Precautions / Restrictions Precautions Precautions: Fall Recall of Precautions/Restrictions: Impaired Precaution/Restrictions Comments: Decreased insight into DVTs and need to mobilize to prevent worsening Restrictions Weight Bearing Restrictions Per Provider Order: No       Mobility Bed Mobility Overal bed mobility: Needs Assistance Bed Mobility: Supine to Sit, Sit to Supine     Supine to sit: Supervision, HOB elevated Sit to supine: Contact guard assist, HOB elevated   General bed mobility comments: CGA to return to supine due to assistance needed with LLE    Transfers Overall transfer level: Needs assistance Equipment used: Rolling walker (2  wheels) Transfers: Sit to/from Stand Sit to Stand: Contact guard assist           General transfer comment: mobility and transfer training performed in room with CGA     Balance Overall balance assessment: Needs assistance Sitting-balance support: Feet unsupported Sitting balance-Leahy Scale: Fair   Postural control: Posterior lean Standing balance support: Single extremity supported, Bilateral upper extremity supported, During functional activity Standing balance-Leahy Scale: Fair Standing balance comment: reliant on RW for support                           ADL either performed or assessed with clinical judgement   ADL Overall ADL's : Needs assistance/impaired                     Lower Body Dressing: Minimal assistance;Sitting/lateral leans Lower Body Dressing Details (indicate cue type and reason): for socks Toilet Transfer: Contact guard assist;Ambulation Toilet Transfer Details (indicate cue type and reason): simulated         Functional mobility during ADLs: Contact guard assist;Supervision/safety;Rolling walker (2 wheels) General ADL Comments: CGA to supervision for mobility for safety. Decline grooming and bathing tasks    Extremity/Trunk Assessment              Vision       Perception     Praxis     Communication Communication Communication: Impaired Factors Affecting Communication: Hearing impaired   Cognition Arousal: Alert Behavior During Therapy: WFL for tasks assessed/performed Cognition: No apparent impairments                               Following commands:  Intact        Cueing   Cueing Techniques: Verbal cues, Tactile cues  Exercises      Shoulder Instructions       General Comments      Pertinent Vitals/ Pain       Pain Assessment Pain Assessment: 0-10 Pain Score: 6  Pain Location: L leg and R knee (chronic) Pain Descriptors / Indicators: Aching, Discomfort, Grimacing, Guarding Pain  Intervention(s): Limited activity within patient's tolerance, Monitored during session, Repositioned, Patient requesting pain meds-RN notified  Home Living                                          Prior Functioning/Environment              Frequency  Min 2X/week        Progress Toward Goals  OT Goals(current goals can now be found in the care plan section)  Progress towards OT goals: Progressing toward goals  Acute Rehab OT Goals Patient Stated Goal: to go home OT Goal Formulation: With patient Time For Goal Achievement: 01/24/24 Potential to Achieve Goals: Good ADL Goals Pt Will Perform Grooming: with supervision;standing Pt Will Perform Lower Body Bathing: with supervision;sit to/from stand Pt Will Perform Lower Body Dressing: with supervision;sit to/from stand Pt Will Transfer to Toilet: with supervision;ambulating;bedside commode Pt Will Perform Toileting - Clothing Manipulation and hygiene: with supervision;sit to/from stand Additional ADL Goal #1: Pt will complete bed mobility mod I in preparation for ADLs.  Plan      Co-evaluation                 AM-PAC OT "6 Clicks" Daily Activity     Outcome Measure   Help from another person eating meals?: None Help from another person taking care of personal grooming?: A Little Help from another person toileting, which includes using toliet, bedpan, or urinal?: A Little Help from another person bathing (including washing, rinsing, drying)?: A Little Help from another person to put on and taking off regular upper body clothing?: None Help from another person to put on and taking off regular lower body clothing?: A Little 6 Click Score: 20    End of Session Equipment Utilized During Treatment: Rolling walker (2 wheels);Gait belt  OT Visit Diagnosis: Unsteadiness on feet (R26.81);Other abnormalities of gait and mobility (R26.89);Pain;Muscle weakness (generalized) (M62.81) Pain - Right/Left:  Left Pain - part of body: Leg   Activity Tolerance Patient limited by pain   Patient Left in bed;with call bell/phone within reach;with bed alarm set   Nurse Communication Mobility status        Time: 3086-5784 OT Time Calculation (min): 24 min  Charges: OT General Charges $OT Visit: 1 Visit OT Treatments $Self Care/Home Management : 8-22 mins $Therapeutic Activity: 8-22 mins  Anitra Barn, OTA Acute Rehabilitation Services  Office [phone]   Jovita Nipper 01/12/2024, 1:18 PM

## 2024-01-12 NOTE — Progress Notes (Signed)
 VASCULAR LAB    Bilateral lower extremity venous duplex has been performed.  See CV proc for preliminary results.   Lorie Cleckley, RVT 01/12/2024, 3:54 PM

## 2024-01-12 NOTE — Plan of Care (Signed)

## 2024-01-13 DIAGNOSIS — I825Y3 Chronic embolism and thrombosis of unspecified deep veins of proximal lower extremity, bilateral: Secondary | ICD-10-CM | POA: Diagnosis not present

## 2024-01-13 NOTE — Discharge Summary (Signed)
 DISCHARGE SUMMARY  Adriana Jordan  MR#: [id number]  DOB:[birth year]  Date of Admission: 01/09/2024 Date of Discharge: 01/13/2024  Attending Physician:Troy Hartzog Constantine Delude, MD  Patient's WUJ:[id number], Bryson Carbine, MD  Disposition: discharge home   Follow-up Appts:  Follow-up Information     [facility] Follow up.   Specialty: Home Health Services Contact information: [address] [place] [address] [phone]         Pasqual Bone, MD Follow up in 10 day(s).   Specialty: Cardiology Contact information: [address] [place] [address] [phone]                 Tests Needing Follow-up: -Assess her compliance with Eliquis  dosing  Discharge Diagnoses: Extensive chronic bilateral lower extremity DVTs w/ PE with acute symptomatic worsening CKD stage IV Hypokalemia Anemia of CKD HLD Gout Chronic adrenal insufficiency status post pituitary adenoma resection  Initial presentation: 89 year old with a history of chronic adrenal insufficiency status post pituitary adenoma resection with radiation treatment, CKD stage IIIb, HTN, HLD, CAD, and known bilateral lower extremity DVTs diagnosed during a hospitalization in May 2025 who presented to the ER 6/2 with worsening of her leg swelling. Hx revealed the patient had not been consistently complaint with her eliquis  dosing.   Hospital Course:  Extensive chronic bilateral lower extremity DVTs w/ PE with acute symptomatic worsening Dating back to April 2025 per review of records - repeat venous duplex 01/03/2024 suggested new clot progression - she is not consistently compliant with her Eliquis  -I have counseled her on the absolute need for very strict compliance with Eliquis  dosing - Venous duplex 6/5 noted unchanged chronic extensive DVT in the right lower extremity and chronic persisting DVT in the left lower extremity though improved from most recent prior exam -she has been fitted  with bilateral lower extremity compression stockings, counseled on need to elevate her legs at all times when sitting, and counseled again on the absolute need to be extremely strict with Eliquis  dosing   CKD stage IV Baseline creatinine approximately 2.0 - creatinine stable/better than baseline during this hospitalization   Hypokalemia Supplemented to normal level   Anemia of CKD Monitored trend with ongoing use of anticoagulation - stable at this time   HLD Continue usual rosuvastatin  dose   Gout Continue allopurinol    Chronic adrenal insufficiency status post pituitary adenoma resection Continue usual hydrocortisone  dose  Allergies as of 01/13/2024       Reactions   Codeine Nausea And Vomiting   Tolerates hydrocodone         Medication List     TAKE these medications    allopurinol  100 MG tablet Commonly known as: ZYLOPRIM  Take 1 tablet (100 mg total) by mouth daily.   cetirizine  10 MG tablet Commonly known as: ZYRTEC  Take 1 tablet (10 mg total) by mouth daily.   diclofenac  Sodium 1 % Gel Commonly known as: VOLTAREN  Apply 4 g topically as needed (shoulder pain, knee pain).   Eliquis  5 MG Tabs tablet Generic drug: apixaban  Take 2 tablets (10 mg) by mouth twice daily-and on Thu 11/17/23-switch to 1 tablet (5 mg) by mouth twice daily   feeding supplement Liqd Take 237 mLs by mouth 2 (two) times daily between meals. What changed:  how much to take when to take this reasons to take this   hydrocortisone  10 MG tablet Commonly known as: CORTEF  Take 1 tablet (10 mg total) by mouth 3 (three) times daily.   oxyCODONE  5 MG  immediate release tablet Commonly known as: Oxy IR/ROXICODONE  Take 5 mg by mouth 3 (three) times daily as needed.   rosuvastatin  10 MG tablet Commonly known as: CRESTOR  Take 1 tablet (10 mg total) by mouth every evening.   Salonpas Pain Relief Patch 3-10 % Ptch Apply 1 patch topically as needed (Right shoulder pain).        Day of  Discharge BP (!) 140/72 (BP Location: Right Arm)   Pulse 69   Temp 97.7 F (36.5 C) (Oral)   Resp 16   Ht 5\' 6"  (1.676 m)   Wt 91.4 kg   LMP  (LMP Unknown)   SpO2 94%   BMI 32.52 kg/m   Physical Exam: General: No acute respiratory distress Lungs: Clear to auscultation bilaterally without wheezes or crackles Cardiovascular: Regular rate and rhythm without murmur gallop or rub normal S1 and S2 Abdomen: Nontender, nondistended, soft, bowel sounds positive, no rebound, no ascites, no appreciable mass Extremities: No significant cyanosis, clubbing, or edema bilateral lower extremities  Basic Metabolic Panel: Recent Labs  Lab 01/09/24 2118 01/10/24 0712 01/11/24 0631  NA 137 138 136  K 4.2 3.3* 3.9  CL 100 104 106  CO2 21* 24 20*  GLUCOSE 96 87 101*  BUN 34* 27* 28*  CREATININE 2.08* 1.86* 1.73*  CALCIUM  8.7* 8.3* 8.5*    CBC: Recent Labs  Lab 01/09/24 2118 01/10/24 0712  WBC 6.1 6.8  NEUTROABS 3.3  --   HGB 11.3* 11.9*  HCT 35.3* 36.7  MCV 89.8 90.2  PLT 418* 432*    Time spent in discharge (includes decision making & examination of pt): 30 minutes  01/13/2024, 2:00 PM   Abbe Abate, MD [facility] Office  [phone]

## 2024-01-13 NOTE — Progress Notes (Signed)
 AVS completed for discharge packet; given to RN to review with patient and patients family.

## 2024-01-13 NOTE — Progress Notes (Signed)
 Patient met discharge criteria per MD. Shelah Derry over AVS with patient and had her tell me how often and how much she was going to take of her Eliquis . Patient given compression stockings to go home with and made sure they fit. Patient had no further questions. IV removed. Patient taken out of hospital via wheelchair.

## 2024-01-13 NOTE — Discharge Instructions (Signed)
 IT IS VERY IMPORTANT THAT YOU TAKE YOUR ELIQUIS  (APIXIBAN) TWICE A DAY EVERY SINGLE DAY.

## 2024-01-23 ENCOUNTER — Other Ambulatory Visit (HOSPITAL_COMMUNITY): Payer: Self-pay

## 2024-05-14 DIAGNOSIS — I509 Heart failure, unspecified: Secondary | ICD-10-CM | POA: Diagnosis not present

## 2024-07-10 ENCOUNTER — Other Ambulatory Visit: Payer: Self-pay | Admitting: *Deleted

## 2024-07-10 DIAGNOSIS — D352 Benign neoplasm of pituitary gland: Secondary | ICD-10-CM
